# Patient Record
Sex: Male | Born: 1935
Health system: Southern US, Community
[De-identification: ages and names within clinical notes are randomized; demographics above are authoritative.]

## PROBLEM LIST (undated history)

## (undated) DIAGNOSIS — M545 Low back pain, unspecified: Secondary | ICD-10-CM

## (undated) DIAGNOSIS — F32A Depression, unspecified: Secondary | ICD-10-CM

## (undated) DIAGNOSIS — E785 Hyperlipidemia, unspecified: Secondary | ICD-10-CM

## (undated) DIAGNOSIS — I2510 Atherosclerotic heart disease of native coronary artery without angina pectoris: Secondary | ICD-10-CM

## (undated) DIAGNOSIS — R51 Headache: Secondary | ICD-10-CM

## (undated) DIAGNOSIS — I10 Essential (primary) hypertension: Secondary | ICD-10-CM

## (undated) DIAGNOSIS — J189 Pneumonia, unspecified organism: Secondary | ICD-10-CM

## (undated) DIAGNOSIS — I739 Peripheral vascular disease, unspecified: Secondary | ICD-10-CM

## (undated) DIAGNOSIS — Z86010 Personal history of colonic polyps: Secondary | ICD-10-CM

## (undated) DIAGNOSIS — B0229 Other postherpetic nervous system involvement: Secondary | ICD-10-CM

## (undated) DIAGNOSIS — K644 Residual hemorrhoidal skin tags: Secondary | ICD-10-CM

## (undated) DIAGNOSIS — K648 Other hemorrhoids: Secondary | ICD-10-CM

## (undated) DIAGNOSIS — F419 Anxiety disorder, unspecified: Secondary | ICD-10-CM

## (undated) DIAGNOSIS — K219 Gastro-esophageal reflux disease without esophagitis: Secondary | ICD-10-CM

## (undated) DIAGNOSIS — I779 Disorder of arteries and arterioles, unspecified: Secondary | ICD-10-CM

## (undated) DIAGNOSIS — M1990 Unspecified osteoarthritis, unspecified site: Secondary | ICD-10-CM

## (undated) DIAGNOSIS — R7302 Impaired glucose tolerance (oral): Secondary | ICD-10-CM

## (undated) DIAGNOSIS — F329 Major depressive disorder, single episode, unspecified: Secondary | ICD-10-CM

## (undated) DIAGNOSIS — I350 Nonrheumatic aortic (valve) stenosis: Secondary | ICD-10-CM

## (undated) HISTORY — DX: Unspecified osteoarthritis, unspecified site: M19.90

## (undated) HISTORY — DX: Major depressive disorder, single episode, unspecified: F32.9

## (undated) HISTORY — DX: Anxiety disorder, unspecified: F41.9

## (undated) HISTORY — DX: Disorder of arteries and arterioles, unspecified: I77.9

## (undated) HISTORY — DX: Peripheral vascular disease, unspecified: I73.9

## (undated) HISTORY — DX: Impaired glucose tolerance (oral): R73.02

## (undated) HISTORY — PX: CARPAL TUNNEL RELEASE: SHX101

## (undated) HISTORY — DX: Hyperlipidemia, unspecified: E78.5

## (undated) HISTORY — DX: Personal history of colonic polyps: Z86.010

## (undated) HISTORY — PX: ACNE CYST REMOVAL: SUR1112

## (undated) HISTORY — DX: Atherosclerotic heart disease of native coronary artery without angina pectoris: I25.10

## (undated) HISTORY — DX: Pneumonia, unspecified organism: J18.9

## (undated) HISTORY — PX: TONSILLECTOMY: SUR1361

## (undated) HISTORY — DX: Low back pain: M54.5

## (undated) HISTORY — DX: Other postherpetic nervous system involvement: B02.29

## (undated) HISTORY — DX: Low back pain, unspecified: M54.50

## (undated) HISTORY — DX: Depression, unspecified: F32.A

## (undated) HISTORY — DX: Residual hemorrhoidal skin tags: K64.4

## (undated) HISTORY — DX: Other hemorrhoids: K64.8

## (undated) HISTORY — PX: OTHER SURGICAL HISTORY: SHX169

## (undated) HISTORY — DX: Essential (primary) hypertension: I10

## (undated) HISTORY — DX: Nonrheumatic aortic (valve) stenosis: I35.0

## (undated) HISTORY — PX: COLONOSCOPY: SHX174

---

## 2000-04-16 HISTORY — PX: CORONARY ARTERY BYPASS GRAFT: SHX141

## 2000-05-02 ENCOUNTER — Inpatient Hospital Stay (HOSPITAL_COMMUNITY): Admission: EM | Admit: 2000-05-02 | Discharge: 2000-05-11 | Payer: Self-pay | Admitting: Emergency Medicine

## 2000-05-02 ENCOUNTER — Encounter: Payer: Self-pay | Admitting: Internal Medicine

## 2000-05-07 ENCOUNTER — Encounter: Payer: Self-pay | Admitting: Thoracic Surgery (Cardiothoracic Vascular Surgery)

## 2000-05-08 ENCOUNTER — Encounter: Payer: Self-pay | Admitting: Thoracic Surgery (Cardiothoracic Vascular Surgery)

## 2000-05-09 ENCOUNTER — Encounter: Payer: Self-pay | Admitting: Thoracic Surgery (Cardiothoracic Vascular Surgery)

## 2000-05-28 ENCOUNTER — Encounter (HOSPITAL_COMMUNITY): Admission: RE | Admit: 2000-05-28 | Discharge: 2000-07-19 | Payer: Self-pay | Admitting: Cardiology

## 2002-02-07 ENCOUNTER — Emergency Department (HOSPITAL_COMMUNITY): Admission: EM | Admit: 2002-02-07 | Discharge: 2002-02-07 | Payer: Self-pay

## 2002-04-16 DIAGNOSIS — Z860100 Personal history of colon polyps, unspecified: Secondary | ICD-10-CM

## 2002-04-16 HISTORY — DX: Personal history of colonic polyps: Z86.010

## 2002-04-16 HISTORY — DX: Personal history of colon polyps, unspecified: Z86.0100

## 2002-06-04 ENCOUNTER — Encounter: Payer: Self-pay | Admitting: Internal Medicine

## 2005-03-02 ENCOUNTER — Ambulatory Visit: Payer: Self-pay | Admitting: Internal Medicine

## 2005-03-07 ENCOUNTER — Ambulatory Visit: Payer: Self-pay | Admitting: Cardiology

## 2005-03-27 ENCOUNTER — Ambulatory Visit: Payer: Self-pay

## 2005-03-30 ENCOUNTER — Emergency Department (HOSPITAL_COMMUNITY): Admission: EM | Admit: 2005-03-30 | Discharge: 2005-03-30 | Payer: Self-pay | Admitting: Emergency Medicine

## 2005-06-04 ENCOUNTER — Ambulatory Visit: Payer: Self-pay | Admitting: Internal Medicine

## 2005-09-03 ENCOUNTER — Ambulatory Visit: Payer: Self-pay | Admitting: Internal Medicine

## 2005-12-05 ENCOUNTER — Ambulatory Visit: Payer: Self-pay | Admitting: Internal Medicine

## 2005-12-07 ENCOUNTER — Ambulatory Visit: Payer: Self-pay | Admitting: Internal Medicine

## 2005-12-07 ENCOUNTER — Ambulatory Visit (HOSPITAL_COMMUNITY): Admission: RE | Admit: 2005-12-07 | Discharge: 2005-12-07 | Payer: Self-pay | Admitting: Internal Medicine

## 2006-03-04 ENCOUNTER — Ambulatory Visit: Payer: Self-pay | Admitting: Internal Medicine

## 2006-03-11 ENCOUNTER — Ambulatory Visit: Payer: Self-pay | Admitting: Cardiology

## 2006-03-20 ENCOUNTER — Encounter: Payer: Self-pay | Admitting: Cardiology

## 2006-03-20 ENCOUNTER — Ambulatory Visit: Payer: Self-pay

## 2006-06-04 ENCOUNTER — Ambulatory Visit: Payer: Self-pay | Admitting: Internal Medicine

## 2006-08-26 ENCOUNTER — Ambulatory Visit: Payer: Self-pay | Admitting: Internal Medicine

## 2006-08-26 LAB — CONVERTED CEMR LAB
AST: 28 units/L (ref 0–37)
Alkaline Phosphatase: 57 units/L (ref 39–117)
BUN: 20 mg/dL (ref 6–23)
Bilirubin, Direct: 0.2 mg/dL (ref 0.0–0.3)
Calcium: 9.7 mg/dL (ref 8.4–10.5)
Creatinine, Ser: 1.1 mg/dL (ref 0.4–1.5)
Direct LDL: 153.6 mg/dL
GFR calc Af Amer: 85 mL/min
GFR calc non Af Amer: 70 mL/min
Glucose, Bld: 103 mg/dL — ABNORMAL HIGH (ref 70–99)
HDL: 37.3 mg/dL — ABNORMAL LOW (ref >39.0)
Potassium: 4 meq/L (ref 3.5–5.1)
Total Bilirubin: 1.5 mg/dL — ABNORMAL HIGH (ref 0.3–1.2)
VLDL: 31 mg/dL (ref 0–40)

## 2006-09-05 ENCOUNTER — Ambulatory Visit: Payer: Self-pay | Admitting: Internal Medicine

## 2007-01-07 ENCOUNTER — Ambulatory Visit: Payer: Self-pay | Admitting: Internal Medicine

## 2007-02-11 ENCOUNTER — Ambulatory Visit: Payer: Self-pay | Admitting: Internal Medicine

## 2007-02-11 LAB — CONVERTED CEMR LAB
AST: 30 units/L (ref 0–37)
Albumin: 4.4 g/dL (ref 3.5–5.2)
Alkaline Phosphatase: 65 units/L (ref 39–117)
BUN: 18 mg/dL (ref 6–23)
Basophils Absolute: 0 10*3/uL (ref 0.0–0.1)
Chloride: 100 meq/L (ref 96–112)
Eosinophils Absolute: 0.3 10*3/uL (ref 0.0–0.6)
Glucose, Bld: 112 mg/dL — ABNORMAL HIGH (ref 70–99)
HCT: 47.8 % (ref 39.0–52.0)
Hemoglobin: 16.2 g/dL (ref 13.0–17.0)
RDW: 13.4 % (ref 11.5–14.6)
Sodium: 135 meq/L (ref 135–145)
TSH: 1.76 microintl units/mL (ref 0.35–5.50)
Total Protein: 7.8 g/dL (ref 6.0–8.3)
WBC: 7.2 10*3/uL (ref 4.5–10.5)

## 2007-02-19 ENCOUNTER — Ambulatory Visit: Payer: Self-pay | Admitting: Cardiology

## 2007-03-05 ENCOUNTER — Ambulatory Visit: Payer: Self-pay

## 2007-03-05 ENCOUNTER — Encounter: Payer: Self-pay | Admitting: Internal Medicine

## 2007-04-17 DIAGNOSIS — B0229 Other postherpetic nervous system involvement: Secondary | ICD-10-CM

## 2007-04-17 DIAGNOSIS — J189 Pneumonia, unspecified organism: Secondary | ICD-10-CM

## 2007-04-17 HISTORY — DX: Other postherpetic nervous system involvement: B02.29

## 2007-04-17 HISTORY — DX: Pneumonia, unspecified organism: J18.9

## 2007-05-01 ENCOUNTER — Ambulatory Visit: Payer: Self-pay | Admitting: Internal Medicine

## 2007-05-01 DIAGNOSIS — IMO0001 Reserved for inherently not codable concepts without codable children: Secondary | ICD-10-CM

## 2007-05-01 DIAGNOSIS — E785 Hyperlipidemia, unspecified: Secondary | ICD-10-CM

## 2007-05-01 DIAGNOSIS — I1310 Hypertensive heart and chronic kidney disease without heart failure, with stage 1 through stage 4 chronic kidney disease, or unspecified chronic kidney disease: Secondary | ICD-10-CM

## 2007-05-01 DIAGNOSIS — R7309 Other abnormal glucose: Secondary | ICD-10-CM

## 2007-05-27 ENCOUNTER — Telehealth: Payer: Self-pay | Admitting: Internal Medicine

## 2007-07-03 ENCOUNTER — Ambulatory Visit: Payer: Self-pay | Admitting: Endocrinology

## 2007-07-03 DIAGNOSIS — B029 Zoster without complications: Secondary | ICD-10-CM | POA: Insufficient documentation

## 2007-07-14 ENCOUNTER — Telehealth: Payer: Self-pay | Admitting: Internal Medicine

## 2007-07-17 ENCOUNTER — Ambulatory Visit: Payer: Self-pay | Admitting: Internal Medicine

## 2007-07-17 DIAGNOSIS — Z860100 Personal history of colon polyps, unspecified: Secondary | ICD-10-CM | POA: Insufficient documentation

## 2007-07-17 DIAGNOSIS — M545 Low back pain: Secondary | ICD-10-CM

## 2007-07-17 DIAGNOSIS — B0229 Other postherpetic nervous system involvement: Secondary | ICD-10-CM

## 2007-07-30 ENCOUNTER — Ambulatory Visit: Payer: Self-pay | Admitting: Internal Medicine

## 2007-09-17 ENCOUNTER — Ambulatory Visit: Payer: Self-pay | Admitting: Internal Medicine

## 2007-09-18 LAB — CONVERTED CEMR LAB
Bilirubin, Direct: 0.2 mg/dL (ref 0.0–0.3)
Chloride: 106 meq/L (ref 96–112)
Cholesterol: 193 mg/dL (ref 0–200)
GFR calc Af Amer: 77 mL/min
HDL: 41.5 mg/dL (ref >39.0)
LDL Cholesterol: 134 mg/dL — ABNORMAL HIGH (ref 0–99)
Potassium: 4.1 meq/L (ref 3.5–5.1)
Sodium: 141 meq/L (ref 135–145)
TSH: 1.32 microintl units/mL (ref 0.35–5.50)
Total Bilirubin: 1.9 mg/dL — ABNORMAL HIGH (ref 0.3–1.2)
Total CHOL/HDL Ratio: 4.7
Total CK: 92 units/L (ref 7–195)
VLDL: 18 mg/dL (ref 0–40)

## 2007-09-22 ENCOUNTER — Ambulatory Visit: Payer: Self-pay | Admitting: Internal Medicine

## 2007-09-22 DIAGNOSIS — R945 Abnormal results of liver function studies: Secondary | ICD-10-CM

## 2007-09-22 DIAGNOSIS — M79609 Pain in unspecified limb: Secondary | ICD-10-CM | POA: Insufficient documentation

## 2007-10-07 ENCOUNTER — Encounter: Payer: Self-pay | Admitting: Internal Medicine

## 2008-01-12 ENCOUNTER — Ambulatory Visit: Payer: Self-pay | Admitting: Internal Medicine

## 2008-01-15 LAB — CONVERTED CEMR LAB
ALT: 26 units/L (ref 0–53)
BUN: 16 mg/dL (ref 6–23)
CO2: 25 meq/L (ref 19–32)
Cholesterol: 235 mg/dL (ref 0–200)
Creatinine, Ser: 1.2 mg/dL (ref 0.4–1.5)
GFR calc non Af Amer: 63 mL/min
Glucose, Bld: 101 mg/dL — ABNORMAL HIGH (ref 70–99)
Sodium: 139 meq/L (ref 135–145)
Total Bilirubin: 1.6 mg/dL — ABNORMAL HIGH (ref 0.3–1.2)
Total CHOL/HDL Ratio: 5.1

## 2008-01-19 ENCOUNTER — Ambulatory Visit: Payer: Self-pay | Admitting: Internal Medicine

## 2008-01-26 ENCOUNTER — Inpatient Hospital Stay (HOSPITAL_COMMUNITY): Admission: EM | Admit: 2008-01-26 | Discharge: 2008-01-31 | Payer: Self-pay | Admitting: Emergency Medicine

## 2008-01-26 ENCOUNTER — Ambulatory Visit: Payer: Self-pay | Admitting: Internal Medicine

## 2008-02-03 ENCOUNTER — Ambulatory Visit: Payer: Self-pay | Admitting: Internal Medicine

## 2008-02-03 DIAGNOSIS — J189 Pneumonia, unspecified organism: Secondary | ICD-10-CM | POA: Insufficient documentation

## 2008-02-03 DIAGNOSIS — J90 Pleural effusion, not elsewhere classified: Secondary | ICD-10-CM | POA: Insufficient documentation

## 2008-02-03 DIAGNOSIS — R0602 Shortness of breath: Secondary | ICD-10-CM

## 2008-02-19 ENCOUNTER — Ambulatory Visit: Payer: Self-pay | Admitting: Cardiology

## 2008-03-04 ENCOUNTER — Ambulatory Visit: Payer: Self-pay | Admitting: Internal Medicine

## 2008-03-04 LAB — CONVERTED CEMR LAB
BUN: 24 mg/dL — ABNORMAL HIGH (ref 6–23)
Calcium: 9.8 mg/dL (ref 8.4–10.5)
Creatinine, Ser: 1.5 mg/dL (ref 0.4–1.5)
GFR calc Af Amer: 59 mL/min
Potassium: 3.9 meq/L (ref 3.5–5.1)
TSH: 0.68 microintl units/mL (ref 0.35–5.50)

## 2008-03-08 ENCOUNTER — Ambulatory Visit: Payer: Self-pay | Admitting: Internal Medicine

## 2008-03-08 DIAGNOSIS — R634 Abnormal weight loss: Secondary | ICD-10-CM

## 2008-03-10 ENCOUNTER — Ambulatory Visit: Payer: Self-pay

## 2008-06-04 ENCOUNTER — Ambulatory Visit: Payer: Self-pay | Admitting: Internal Medicine

## 2008-06-04 LAB — CONVERTED CEMR LAB
ALT: 34 units/L (ref 0–53)
AST: 35 units/L (ref 0–37)
Bilirubin, Direct: 0.2 mg/dL (ref 0.0–0.3)
Calcium: 9.6 mg/dL (ref 8.4–10.5)
GFR calc Af Amer: 76 mL/min
GFR calc non Af Amer: 63 mL/min
Sodium: 141 meq/L (ref 135–145)
Triglycerides: 69 mg/dL (ref 0–149)
VLDL: 14 mg/dL (ref 0–40)

## 2008-06-08 ENCOUNTER — Ambulatory Visit: Payer: Self-pay | Admitting: Internal Medicine

## 2008-09-02 ENCOUNTER — Ambulatory Visit: Payer: Self-pay | Admitting: Internal Medicine

## 2008-09-02 LAB — CONVERTED CEMR LAB
ALT: 22 units/L (ref 0–53)
AST: 28 units/L (ref 0–37)
Albumin: 3.9 g/dL (ref 3.5–5.2)
CO2: 26 meq/L (ref 19–32)
Calcium: 9.4 mg/dL (ref 8.4–10.5)
Chloride: 107 meq/L (ref 96–112)
Cholesterol: 217 mg/dL — ABNORMAL HIGH (ref 0–200)
Direct LDL: 168.5 mg/dL
GFR calc non Af Amer: 69.45 mL/min (ref >60)
HDL: 45.9 mg/dL (ref >39.00)
Hgb A1c MFr Bld: 5.8 % (ref 4.6–6.5)
Potassium: 3.7 meq/L (ref 3.5–5.1)
Sodium: 142 meq/L (ref 135–145)
Total Protein: 7 g/dL (ref 6.0–8.3)
Triglycerides: 77 mg/dL (ref 0.0–149.0)
VLDL: 15.4 mg/dL (ref 0.0–40.0)

## 2008-09-06 ENCOUNTER — Ambulatory Visit: Payer: Self-pay | Admitting: Internal Medicine

## 2008-11-17 ENCOUNTER — Ambulatory Visit: Payer: Self-pay | Admitting: Internal Medicine

## 2008-11-17 ENCOUNTER — Ambulatory Visit: Payer: Self-pay | Admitting: Endocrinology

## 2008-11-17 ENCOUNTER — Inpatient Hospital Stay (HOSPITAL_COMMUNITY): Admission: AD | Admit: 2008-11-17 | Discharge: 2008-11-19 | Payer: Self-pay | Admitting: Internal Medicine

## 2008-11-17 DIAGNOSIS — I200 Unstable angina: Secondary | ICD-10-CM

## 2008-11-17 DIAGNOSIS — R1013 Epigastric pain: Secondary | ICD-10-CM

## 2008-11-17 DIAGNOSIS — R079 Chest pain, unspecified: Secondary | ICD-10-CM

## 2008-11-19 ENCOUNTER — Encounter: Payer: Self-pay | Admitting: Endocrinology

## 2008-12-06 ENCOUNTER — Encounter: Payer: Self-pay | Admitting: Cardiovascular Disease

## 2008-12-06 ENCOUNTER — Ambulatory Visit: Payer: Self-pay | Admitting: Internal Medicine

## 2008-12-31 ENCOUNTER — Ambulatory Visit: Payer: Self-pay | Admitting: Internal Medicine

## 2008-12-31 LAB — CONVERTED CEMR LAB
AST: 28 units/L (ref 0–37)
CO2: 26 meq/L (ref 19–32)
Chloride: 110 meq/L (ref 96–112)
GFR calc non Af Amer: 84.13 mL/min (ref >60)
Glucose, Bld: 96 mg/dL (ref 70–99)
Hgb A1c MFr Bld: 5.6 % (ref 4.6–6.5)
Potassium: 4 meq/L (ref 3.5–5.1)
TSH: 0.56 microintl units/mL (ref 0.35–5.50)
Total CHOL/HDL Ratio: 5
Total Protein: 7.4 g/dL (ref 6.0–8.3)
Triglycerides: 99 mg/dL (ref 0.0–149.0)
VLDL: 19.8 mg/dL (ref 0.0–40.0)

## 2009-01-04 ENCOUNTER — Ambulatory Visit: Payer: Self-pay | Admitting: Internal Medicine

## 2009-02-11 ENCOUNTER — Ambulatory Visit: Payer: Self-pay | Admitting: Cardiology

## 2009-02-11 DIAGNOSIS — I6529 Occlusion and stenosis of unspecified carotid artery: Secondary | ICD-10-CM

## 2009-03-21 ENCOUNTER — Ambulatory Visit: Payer: Self-pay

## 2009-03-21 ENCOUNTER — Encounter: Payer: Self-pay | Admitting: Cardiology

## 2009-03-25 ENCOUNTER — Ambulatory Visit: Payer: Self-pay | Admitting: Cardiology

## 2009-03-25 ENCOUNTER — Telehealth: Payer: Self-pay | Admitting: Cardiology

## 2009-03-25 DIAGNOSIS — I2510 Atherosclerotic heart disease of native coronary artery without angina pectoris: Secondary | ICD-10-CM

## 2009-03-25 LAB — CONVERTED CEMR LAB
ALT: 32 units/L (ref 0–53)
CO2: 27 meq/L (ref 19–32)
Calcium: 9.4 mg/dL (ref 8.4–10.5)
Cholesterol: 194 mg/dL (ref 0–200)
Glucose, Bld: 108 mg/dL — ABNORMAL HIGH (ref 70–99)
HDL: 42.8 mg/dL (ref >39.00)
LDL Cholesterol: 131 mg/dL — ABNORMAL HIGH (ref 0–99)
Total Bilirubin: 1.6 mg/dL — ABNORMAL HIGH (ref 0.3–1.2)
Total CHOL/HDL Ratio: 5
VLDL: 20.4 mg/dL (ref 0.0–40.0)

## 2009-03-30 ENCOUNTER — Ambulatory Visit: Payer: Self-pay | Admitting: Cardiology

## 2009-04-01 ENCOUNTER — Ambulatory Visit: Payer: Self-pay | Admitting: Internal Medicine

## 2009-04-01 DIAGNOSIS — Z87891 Personal history of nicotine dependence: Secondary | ICD-10-CM | POA: Insufficient documentation

## 2009-05-10 ENCOUNTER — Encounter: Payer: Self-pay | Admitting: Internal Medicine

## 2009-06-29 ENCOUNTER — Ambulatory Visit: Payer: Self-pay | Admitting: Internal Medicine

## 2009-06-29 LAB — CONVERTED CEMR LAB
Calcium: 9.2 mg/dL (ref 8.4–10.5)
Chloride: 107 meq/L (ref 96–112)
GFR calc non Af Amer: 75.99 mL/min (ref >60)
Glucose, Bld: 111 mg/dL — ABNORMAL HIGH (ref 70–99)
Potassium: 3.8 meq/L (ref 3.5–5.1)
Sodium: 141 meq/L (ref 135–145)

## 2009-07-01 ENCOUNTER — Ambulatory Visit: Payer: Self-pay | Admitting: Internal Medicine

## 2009-07-01 DIAGNOSIS — R635 Abnormal weight gain: Secondary | ICD-10-CM | POA: Insufficient documentation

## 2009-09-15 ENCOUNTER — Ambulatory Visit: Payer: Self-pay | Admitting: Cardiology

## 2009-09-19 IMAGING — CT CT ANGIO CHEST
2 of 5 series · 19 of 36 positions shown · IV contrast (100 ML OMNI 300)
Comparison: None

CLINICAL DATA: Cough, shortness of breath, chest pain and fever.

CT ANGIOGRAPHY CHEST
TECHNIQUE: Multidetector CT imaging of the chest using the
standard protocol during bolus administration of intravenous
contrast. Multiplanar reconstructed images including MIPs were
obtained and reviewed to evaluate the vascular anatomy.
Contrast: 100 ml intravenous [id number]

[Series 3: pe · axial · 0.76mm/px · z∈[-261,-40]mm · 16 of 201 slices shown]
[im 12/201  lung]
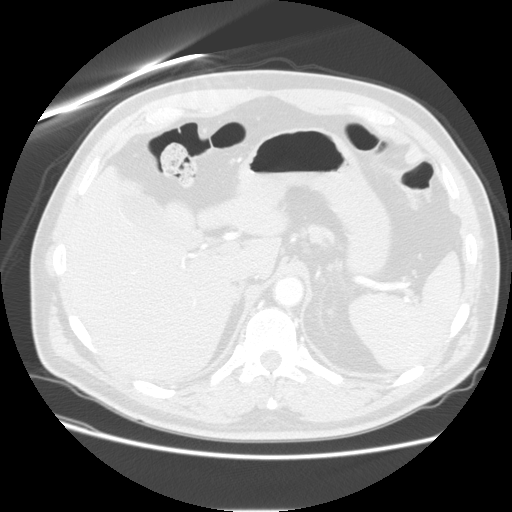
[im 24/201  mediastinal]
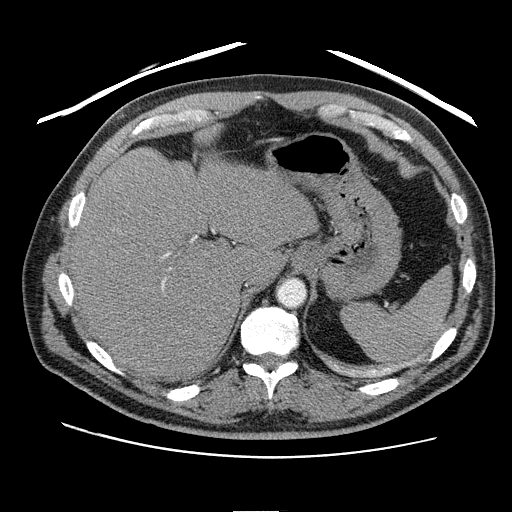
[im 36/201  lung]
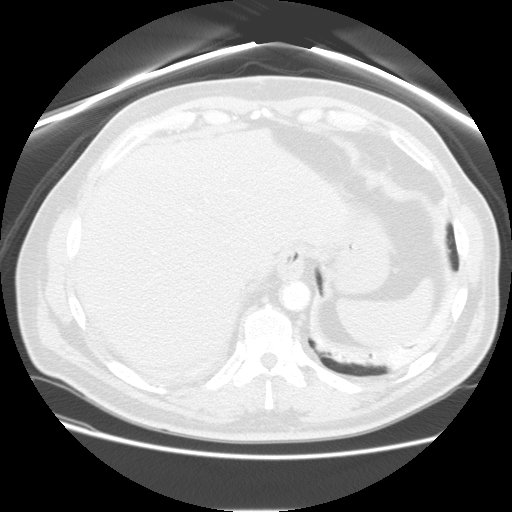
[im 48/201  mediastinal]
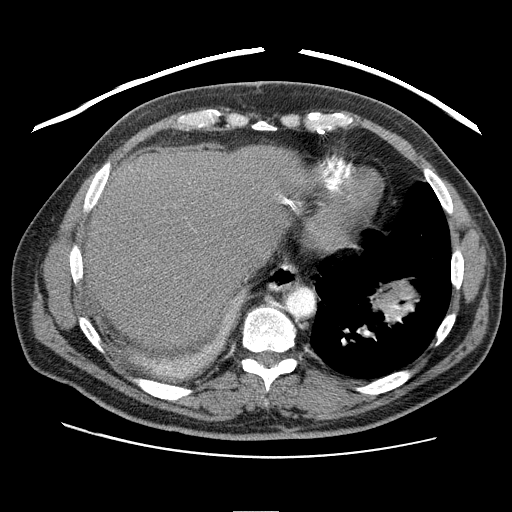
[im 59/201  lung]
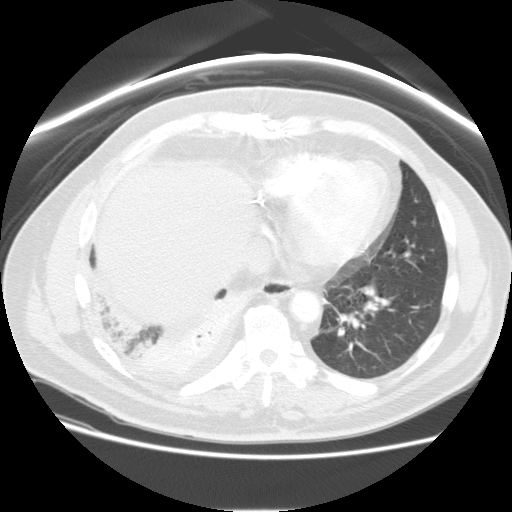
[im 71/201  mediastinal]
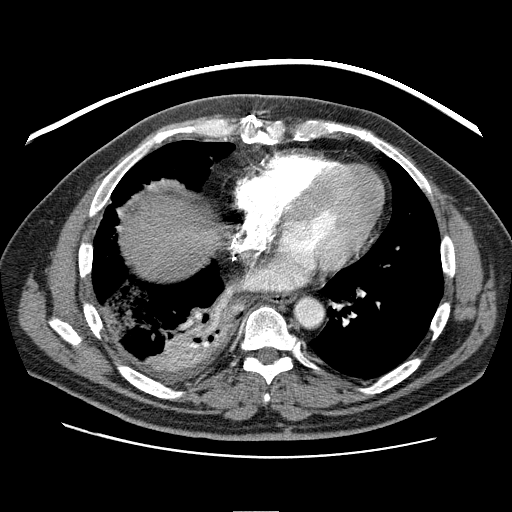
[im 83/201  lung]
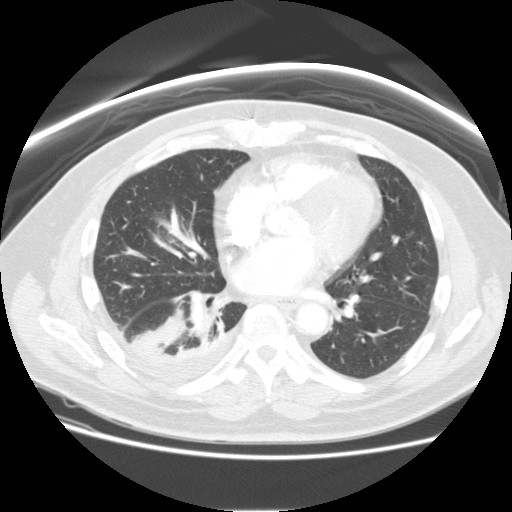
[im 95/201  mediastinal]
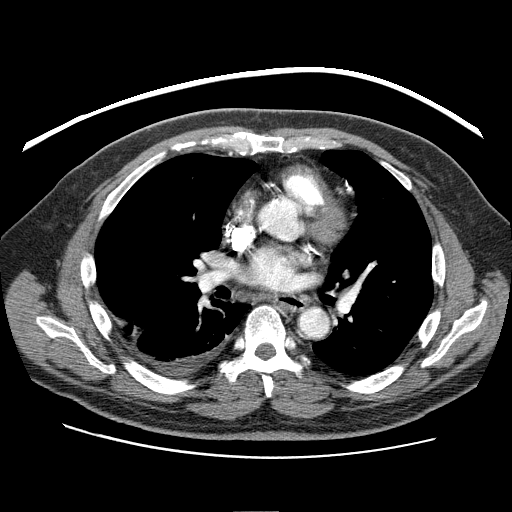
[im 106/201  lung]
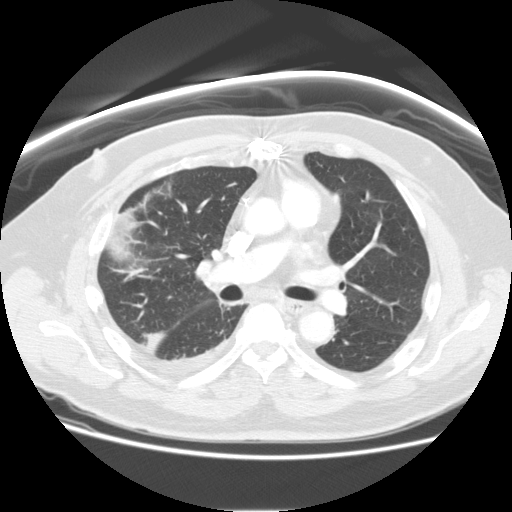
[im 118/201  mediastinal]
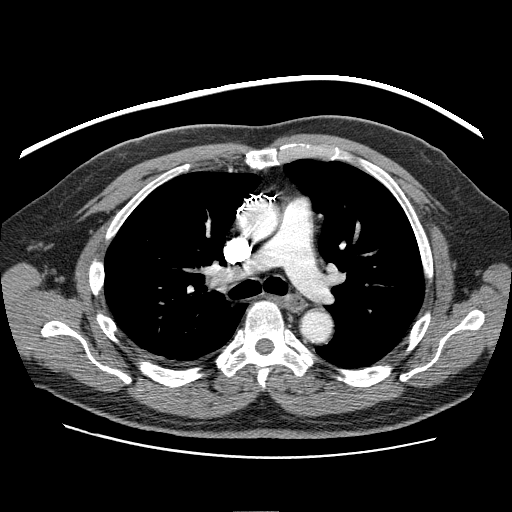
[im 130/201  lung]
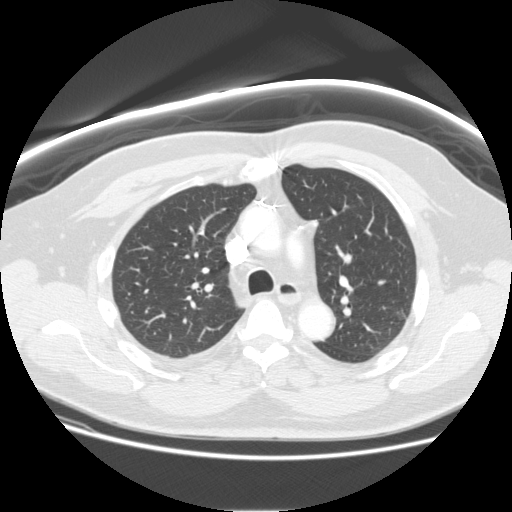
[im 142/201  mediastinal]
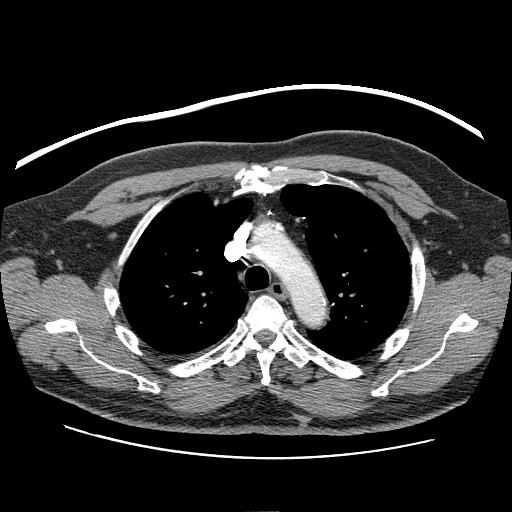
[im 153/201  lung]
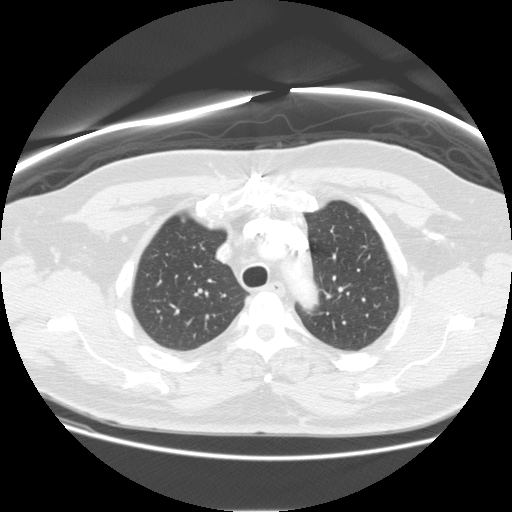
[im 165/201  mediastinal]
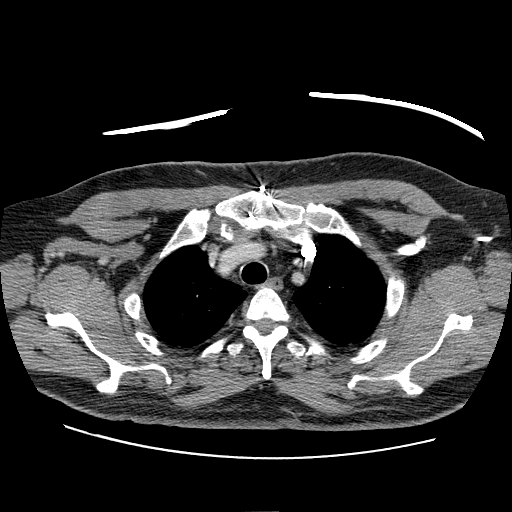
[im 177/201  lung]
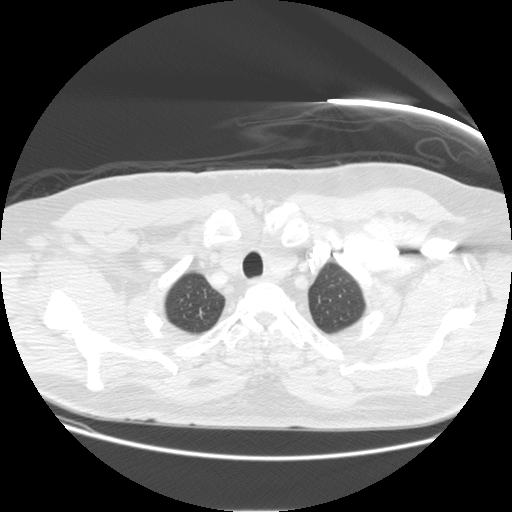
[im 189/201  mediastinal]
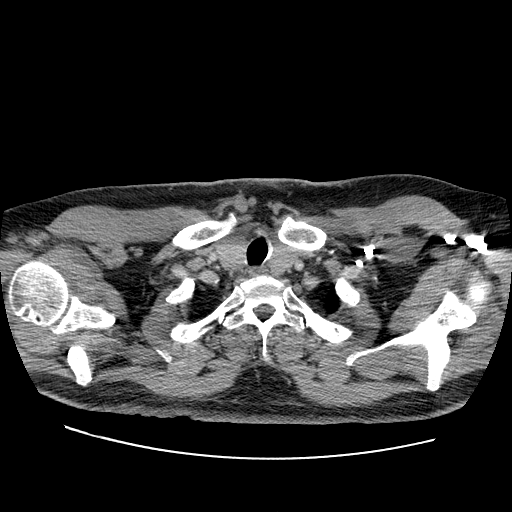

[Series 301: cor · coronal · 0.76mm/px · 3 of 114 slices shown]
[im 23/114  mediastinal]
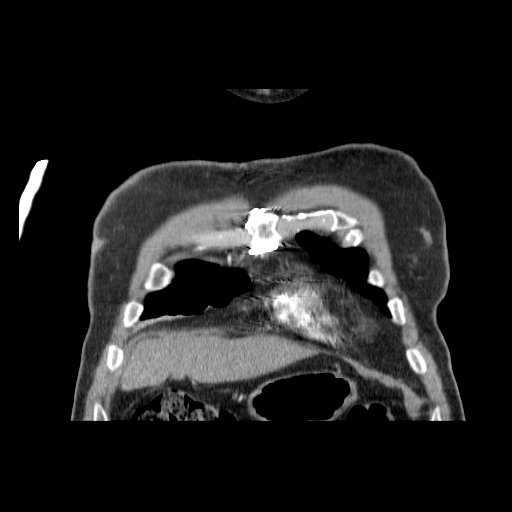
[im 46/114  mediastinal]
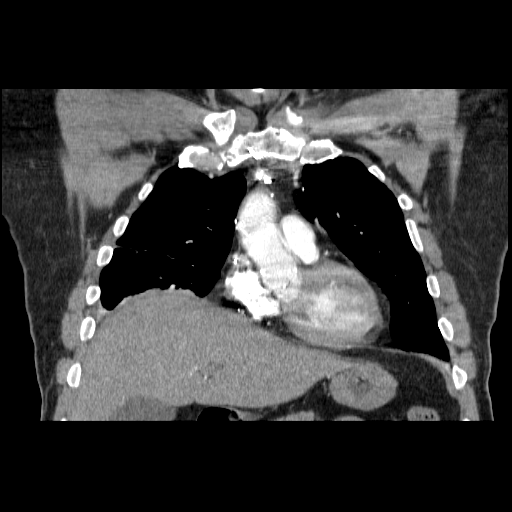
[im 68/114  mediastinal]
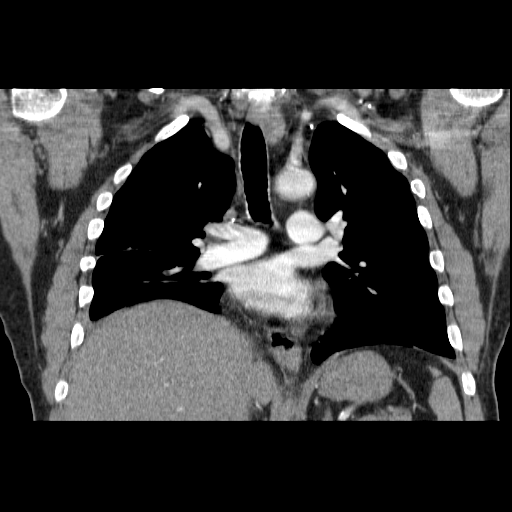

[19 of 36 positions shown; findings below may reference images not displayed]

FINDINGS: This is a technically adequate study.

There are no definite filling defects identified within the
pulmonary arterial system to suggest pulmonary emboli.
There is no evidence of thoracic aortic dissection or aneurysm.
Evidence of prior cardiac surgery noted.

Airspace disease and atelectasis within the right lower lobe noted
with a small right pleural effusion.
Mild left basilar atelectasis is noted.
There is no evidence of pericardial effusion or enlarged lymph
nodes.

No acute or suspicious bony abnormalities are identified.
IMPRESSION: Right lower lobe pneumonia with associated atelectasis and small
right pleural effusion.

No evidence of pulmonary emboli or thoracic aortic
aneurysm/dissection.

Mild left basilar atelectasis.

## 2009-09-27 ENCOUNTER — Telehealth (INDEPENDENT_AMBULATORY_CARE_PROVIDER_SITE_OTHER): Payer: Self-pay | Admitting: *Deleted

## 2009-09-28 ENCOUNTER — Ambulatory Visit: Payer: Self-pay | Admitting: Cardiology

## 2009-09-28 ENCOUNTER — Encounter: Payer: Self-pay | Admitting: Cardiology

## 2009-09-28 ENCOUNTER — Ambulatory Visit: Payer: Self-pay | Admitting: Internal Medicine

## 2009-09-28 ENCOUNTER — Encounter (HOSPITAL_COMMUNITY): Admission: RE | Admit: 2009-09-28 | Discharge: 2009-11-15 | Payer: Self-pay | Admitting: Cardiology

## 2009-09-28 ENCOUNTER — Ambulatory Visit: Payer: Self-pay

## 2009-09-28 LAB — CONVERTED CEMR LAB
ALT: 34 units/L (ref 0–53)
AST: 34 units/L (ref 0–37)
Albumin: 4.2 g/dL (ref 3.5–5.2)
Alkaline Phosphatase: 56 units/L (ref 39–117)
Bilirubin Urine: NEGATIVE
Bilirubin, Direct: 0.2 mg/dL (ref 0.0–0.3)
CO2: 23 meq/L (ref 19–32)
Chloride: 107 meq/L (ref 96–112)
Cholesterol: 204 mg/dL — ABNORMAL HIGH (ref 0–200)
Eosinophils Relative: 3.7 % (ref 0.0–5.0)
GFR calc non Af Amer: 87.63 mL/min (ref >60)
Hemoglobin, Urine: NEGATIVE
Ketones, ur: NEGATIVE mg/dL
Lymphocytes Relative: 30.1 % (ref 12.0–46.0)
Lymphs Abs: 2 10*3/uL (ref 0.7–4.0)
MCV: 88.2 fL (ref 78.0–100.0)
Neutrophils Relative %: 58.8 % (ref 43.0–77.0)
Nitrite: NEGATIVE
PSA: 0.36 ng/mL (ref 0.10–4.00)
Potassium: 3.9 meq/L (ref 3.5–5.1)
RBC: 4.94 M/uL (ref 4.22–5.81)
TSH: 1.47 microintl units/mL (ref 0.35–5.50)
Total Bilirubin: 1.3 mg/dL — ABNORMAL HIGH (ref 0.3–1.2)
Total Protein, Urine: NEGATIVE mg/dL
Urine Glucose: NEGATIVE mg/dL
VLDL: 21.2 mg/dL (ref 0.0–40.0)

## 2009-10-03 ENCOUNTER — Ambulatory Visit: Payer: Self-pay | Admitting: Internal Medicine

## 2009-10-05 ENCOUNTER — Telehealth: Payer: Self-pay | Admitting: Cardiology

## 2009-10-05 ENCOUNTER — Encounter: Payer: Self-pay | Admitting: Cardiology

## 2009-10-06 ENCOUNTER — Ambulatory Visit: Payer: Self-pay | Admitting: Cardiology

## 2009-10-07 ENCOUNTER — Telehealth: Payer: Self-pay | Admitting: Cardiology

## 2009-10-10 ENCOUNTER — Telehealth: Payer: Self-pay | Admitting: Cardiology

## 2009-10-10 LAB — CONVERTED CEMR LAB
Eosinophils Absolute: 0.3 10*3/uL (ref 0.0–0.7)
Eosinophils Relative: 4.1 % (ref 0.0–5.0)
GFR calc non Af Amer: 90.58 mL/min (ref >60)
Glucose, Bld: 103 mg/dL — ABNORMAL HIGH (ref 70–99)
HCT: 43.1 % (ref 39.0–52.0)
INR: 1.1 — ABNORMAL HIGH (ref 0.8–1.0)
Lymphocytes Relative: 34.1 % (ref 12.0–46.0)
Lymphs Abs: 2.3 10*3/uL (ref 0.7–4.0)
MCHC: 34.2 g/dL (ref 30.0–36.0)
Neutro Abs: 3.7 10*3/uL (ref 1.4–7.7)
Neutrophils Relative %: 55 % (ref 43.0–77.0)
RBC: 4.89 M/uL (ref 4.22–5.81)
RDW: 14.2 % (ref 11.5–14.6)
WBC: 6.7 10*3/uL (ref 4.5–10.5)
aPTT: 29.2 s — ABNORMAL HIGH (ref 21.7–28.8)

## 2009-10-11 ENCOUNTER — Ambulatory Visit: Payer: Self-pay | Admitting: Cardiology

## 2009-10-11 ENCOUNTER — Inpatient Hospital Stay (HOSPITAL_BASED_OUTPATIENT_CLINIC_OR_DEPARTMENT_OTHER): Admission: RE | Admit: 2009-10-11 | Discharge: 2009-10-11 | Payer: Self-pay | Admitting: Cardiology

## 2009-10-13 ENCOUNTER — Telehealth: Payer: Self-pay | Admitting: Cardiology

## 2009-10-28 ENCOUNTER — Encounter (INDEPENDENT_AMBULATORY_CARE_PROVIDER_SITE_OTHER): Payer: Self-pay | Admitting: *Deleted

## 2009-11-10 ENCOUNTER — Ambulatory Visit: Payer: Self-pay | Admitting: Cardiology

## 2009-12-14 ENCOUNTER — Telehealth: Payer: Self-pay | Admitting: Internal Medicine

## 2009-12-23 ENCOUNTER — Ambulatory Visit: Payer: Self-pay | Admitting: Internal Medicine

## 2009-12-23 LAB — CONVERTED CEMR LAB
Albumin: 4.2 g/dL (ref 3.5–5.2)
Bilirubin, Direct: 0.3 mg/dL (ref 0.0–0.3)
CO2: 27 meq/L (ref 19–32)
Chloride: 105 meq/L (ref 96–112)
Creatinine, Ser: 1.1 mg/dL (ref 0.4–1.5)
Glucose, Bld: 89 mg/dL (ref 70–99)
HDL: 41.9 mg/dL (ref >39.00)
LDL Cholesterol: 127 mg/dL — ABNORMAL HIGH (ref 0–99)
Sodium: 141 meq/L (ref 135–145)
Total Bilirubin: 1.9 mg/dL — ABNORMAL HIGH (ref 0.3–1.2)

## 2009-12-27 ENCOUNTER — Ambulatory Visit: Payer: Self-pay | Admitting: Internal Medicine

## 2010-01-05 ENCOUNTER — Telehealth (INDEPENDENT_AMBULATORY_CARE_PROVIDER_SITE_OTHER): Payer: Self-pay | Admitting: *Deleted

## 2010-01-24 ENCOUNTER — Ambulatory Visit: Payer: Self-pay | Admitting: Internal Medicine

## 2010-03-06 ENCOUNTER — Ambulatory Visit: Payer: Self-pay | Admitting: Cardiology

## 2010-03-24 ENCOUNTER — Ambulatory Visit: Payer: Self-pay | Admitting: Internal Medicine

## 2010-03-29 LAB — CONVERTED CEMR LAB
BUN: 16 mg/dL (ref 6–23)
CO2: 26 meq/L (ref 19–32)
Calcium: 9.5 mg/dL (ref 8.4–10.5)
GFR calc non Af Amer: 81.29 mL/min (ref >60.00)
Potassium: 4 meq/L (ref 3.5–5.1)

## 2010-03-31 ENCOUNTER — Ambulatory Visit: Payer: Self-pay | Admitting: Internal Medicine

## 2010-03-31 DIAGNOSIS — R05 Cough: Secondary | ICD-10-CM

## 2010-04-24 ENCOUNTER — Encounter: Payer: Self-pay | Admitting: Internal Medicine

## 2010-05-18 NOTE — Procedures (Signed)
Summary: Colonoscopy: Hyperplastic Polyp   Colonoscopy  Procedure date:  06/04/2002  Findings:      Results: Hemorrhoids. Pathology:  Hyperplastic polyp.  Location:  [facility].    Comments:      Repeat colonoscopy in 10 years.    Procedures Next Due Date:    Colonoscopy: 06/2012  Patient Name: [name] [name], [name] [name] MRN:  Procedure Procedures: Colonoscopy CPT: 11914.    with polypectomy. CPT: A3573898.  Personnel: Endoscopist: Iva Boop, MD, [facility].  Referred By: Linda Hedges Plotnikov, MD.  Exam Location: Exam performed in Outpatient Clinic. Outpatient  Patient Consent: Procedure, Alternatives, Risks and Benefits discussed, consent obtained, from patient. Consent was obtained by the RN.  Indications Symptoms: Hematochezia.  History  Pre-Exam Physical: Performed Jun 04, 2002. Cardio-pulmonary exam WNL. Rectal exam abnormal. HEENT exam , Abdominal exam, Mental status exam WNL. Abnormal PE findings include: external hemorrhoids.  Exam Exam: Extent of exam reached: Cecum, extent intended: Cecum.  The cecum was identified by appendiceal orifice and IC valve. Patient position: on left side. Colon retroflexion performed. Images taken. ASA Classification: II. Tolerance: excellent.  Monitoring: Pulse and BP monitoring, Oximetry used. Supplemental O2 given.  Colon Prep Used Golytely for colon prep. Prep results: excellent.  Sedation Meds: Patient assessed and found to be appropriate for moderate (conscious) sedation. Fentanyl 75 mcg. given IV. Versed 8 mg. given IV.  Findings POLYP: Descending Colon, Maximum size: 6 mm. sessile polyp. Distance from Anus 50 cm. Procedure:  snare without cautery, removed, retrieved, Polyp sent to pathology. ICD9: Neoplasia, Benign, Large Bowel: 211.3.  - NORMAL EXAM: Cecum to Splenic Flexure.  NORMAL EXAM: Sigmoid Colon.  MULTIPLE POLYPS: Rectum. Procedure:  biopsy without cautery, removed, retrieved, 3 polyps Polyps sent to  pathology. ICD9: Neoplasia, Benign, Rectum: 211.4. Comments:  3 3-4 mm flat white polyps removed.  HEMORRHOIDS: Internal and External. Size: Grade II. Not bleeding. Not thrombosed. ICD9: Hemorrhoids, Internal and  External: 455.6.   Assessment Abnormal examination, see findings above.  Diagnoses: 211.3: Neoplasia, Benign, Large Bowel.  211.4: Neoplasia, Benign, Rectum.  455.6: Hemorrhoids, Internal and  External.   Events  Unplanned Interventions: No intervention was required.  Plans Medication Plan: Await pathology.  Patient Education: Patient given standard instructions for: Polyps. Hemorrhoids.  Disposition: After procedure patient sent to recovery. After recovery patient sent home.  Scheduling/Referral: Await pathology to schedule patient.  Comments: I will review path and determine follow-up plans.  CC:   Sonda Primes, MD  This report was created from the original endoscopy report, which was reviewed and signed by the above listed endoscopist.

## 2010-05-18 NOTE — Letter (Signed)
Summary: New Patient letter  [facility]  [address] [place], [place] [address]   Phone: [phone]  Fax: [phone]       10/28/2009 MRN: [id number]  [name] [name] [address] [place], [place]  [address]  Dear Mr. ESKENAZI,  Welcome to the Gastroenterology Division at [facility].    You are scheduled to see Dr.  Juanda Chance on 12/23/2009 at 10:30AM on the 3rd floor at [facility], [address]  We ask that you try to arrive at our office 15 minutes prior to your appointment time to allow for check-in.  We would like you to complete the enclosed self-administered evaluation form prior to your visit and bring it with you on the day of your appointment.  We will review it with you.  Also, please bring a complete list of all your medications or, if you prefer, bring the medication bottles and we will list them.  Please bring your insurance card so that we may make a copy of it.  If your insurance requires a referral to see a specialist, please bring your referral form from your primary care physician.  Co-payments are due at the time of your visit and may be paid by cash, check or credit card.     Your office visit will consist of a consult with your physician (includes a physical exam), any laboratory testing he/she may order, scheduling of any necessary diagnostic testing (e.g. x-ray, ultrasound, CT-scan), and scheduling of a procedure (e.g. Endoscopy, Colonoscopy) if required.  Please allow enough time on your schedule to allow for any/all of these possibilities.    If you cannot keep your appointment, please call [phone] to cancel or reschedule prior to your appointment date.  This allows [place] the opportunity to schedule an appointment for another patient in need of care.  If you do not cancel or reschedule by 5 p.m. the business day prior to your appointment date, you will be charged a $50.00 late cancellation/no-show fee.    Thank you for choosing  [facility] for your medical needs.  We appreciate the opportunity to care for you.  Please visit [place] at our website  to learn more about our practice.                     Sincerely,                                                             The Gastroenterology Division

## 2010-05-18 NOTE — Assessment & Plan Note (Signed)
Summary: F/U /CY  Medications Added LOTREL 5-20 MG CAPS (AMLODIPINE BESY-BENAZEPRIL HCL) 1 cap once daily        Visit Type:  rov Primary Provider:  Tresa Garter MD  CC:  chest tightness sometimes .Marland Kitchen..denies any edema or sob...Marland Kitchenpt has lost 7 lb's since 06/2009.  History of Present Illness: Mr. Liskey comes in today for evaluation and management his coronary disease.  He did have some atypical chest pain on the left side of his chest. It does not radiate. It doesn't appear to be associated with exertion. There is no other concomitant symptoms.  He has lost the weight he gained during the winter. Blood pressure is much better now.  He denies orthopnea, PND or edema. He's had no palpitations or syncope.  His last stress test was in November 2008.  Clinical Reports Reviewed:  Nuclear Study:  03/05/2007:    Impression:  EF: 64%  Exercise Capacity: Adenosine study with no exercise. Blood Pressure Response: Clinical Symptoms: ECG Impression:Baseline: NSR with RBBB and diffuse ST-T wave abnormalities. No significant ST segment change with adenosine.  Overall Impression: Normal stress nuclear study.  Arvilla Meres, MD   Current Medications (verified): 1)  Crestor 40 Mg Tabs (Rosuvastatin Calcium) .Marland Kitchen.. 1 By Mouth Qd 2)  Vitamin D3 1000 Unit  Tabs (Cholecalciferol) .Marland Kitchen.. 1 Qd 3)  Aspirin 325 Mg Tabs (Aspirin) .... Take 1 Tab By Mouth Every Day 4)  Folic Acid 400 Mcg  Tabs (Folic Acid) .Marland Kitchen.. 1 By Mouth Qd 5)  Travatan 0.004 %  Soln (Travoprost) .... Xto Eye Q Hs 6)  Multivitamins   Tabs (Multiple Vitamin) .Marland Kitchen.. 1 Tab Once Daily 7)  Vitamin B-12 500 Mcg Tabs (Cyanocobalamin) .... Once Daily 8)  Plavix 75 Mg Tabs (Clopidogrel Bisulfate) .Marland Kitchen.. 1 Qd 9)  Nitrostat 0.4 Mg Subl (Nitroglycerin) .Marland Kitchen.. 1 Q5 Min As Needed Chest Pain 10)  Lopressor Hct 100-25 Mg Tabs (Metoprolol-Hydrochlorothiazide) .Marland Kitchen.. 1 By Mouth Qam 11)  Lotrel 5-20 Mg Caps (Amlodipine Besy-Benazepril Hcl)  .Marland Kitchen.. 1 Cap Once Daily  Allergies: 1)  Vytorin  Past History:  Past Medical History: Last updated: 01/04/2009 Coronary artery disease STENT 2010 Hypertension Peripheral vascular disease carotid artery stenosis Hyperlipidemia glucose intolerance DJD right hip Low back pain/facet arthropathy Colonic polyps, hx of - hyperplastic 2004 glaucoma R scalp + R ophth H. Zoster 2009 R head postherpetic neuralgia 2009 RLL pneumonia and a small effusion 2009  Past Surgical History: Last updated: 07/17/2007 Coronary artery bypass graft - 2002 s/p left Carpal tunnel s/p left thumb tendon surgury s/p cyst x 2 off the back early 80's Tonsillectomy  Family History: Last updated: 07/17/2007 Family History Hypertension heart disease - paternal mother with breast cancer high cholesterol  Social History: Last updated: 07/17/2007 Retired - Forensic scientist Married Former Smoker Alcohol use-no 3 children  Risk Factors: Smoking Status: quit (07/01/2009)  Review of Systems       negative other than history of present illness  Vital Signs:  Patient profile:   77 year old male Height:      68 inches Weight:      205 pounds BMI:     31.28 Pulse rate:   65 / minute Pulse rhythm:   irregular BP sitting:   130 / 64  (left arm) Cuff size:   large  Vitals Entered By: Danielle Rankin, CMA (September 15, 2009 3:02 PM)  Physical Exam  General:  obese.   Head:  normocephalic and atraumatic Eyes:  PERRLA/EOM intact; conjunctiva  and lids normal. Neck:  Neck supple, no JVD. No masses, thyromegaly or abnormal cervical nodes. Chest Terik Haughey:  no deformities or breast masses noted Lungs:  Clear bilaterally to auscultation and percussion. Heart:  Non-displaced PMI, chest non-tender; regular rate and rhythm, S1, S2 without murmurs, rubs or gallops. Carotid upstroke normal, no bruit. Normal abdominal aortic size, no bruits. Femorals normal pulses, no bruits. Pedals normal pulses. No edema, no  varicosities. Abdomen:  Bowel sounds positive; abdomen soft and non-tender without masses, organomegaly, or hernias noted. No hepatosplenomegaly. Msk:  decreased ROM.   Pulses:  pulses normal in all 4 extremities Extremities:  No clubbing or cyanosis. Neurologic:  Alert and oriented x 3. Skin:  Intact without lesions or rashes. Psych:  Normal affect.   EKG  Procedure date:  09/15/2009  Findings:      normal sinus rhythm, right bundle branch block which is old, deep T wave inversion in one aVL which are more significant than last EKG.  Impression & Recommendations:  Problem # 1:  CORONARY ATHEROSCLEROSIS NATIVE CORONARY ARTERY (ICD-414.01)  He has a typical chest pain but EKG is worrisome for T-wave changes in one aVL as well as V5 and 6. We'll perform a stress Myoview since been several years since his last study. His updated medication list for this problem includes:    Aspirin 325 Mg Tabs (Aspirin) .Marland Kitchen... Take 1 tab by mouth every day    Plavix 75 Mg Tabs (Clopidogrel bisulfate) .Marland Kitchen... 1 qd    Nitrostat 0.4 Mg Subl (Nitroglycerin) .Marland Kitchen... 1 q5 min as needed chest pain    Lopressor Hct 100-25 Mg Tabs (Metoprolol-hydrochlorothiazide) .Marland Kitchen... 1 by mouth qam    Lotrel 5-20 Mg Caps (Amlodipine besy-benazepril hcl) .Marland Kitchen... 1 cap once daily  Orders: EKG w/ Interpretation (93000)  Problem # 2:  WEIGHT GAIN, ABNORMAL (ICD-783.1) Assessment: Improved  Problem # 3:  HYPERTENSION (ICD-401.9) Assessment: Improved His weight loss he does help his pressure. Have encouraged him to keep it down or lose more. His updated medication list for this problem includes:    Aspirin 325 Mg Tabs (Aspirin) .Marland Kitchen... Take 1 tab by mouth every day    Lopressor Hct 100-25 Mg Tabs (Metoprolol-hydrochlorothiazide) .Marland Kitchen... 1 by mouth qam    Lotrel 5-20 Mg Caps (Amlodipine besy-benazepril hcl) .Marland Kitchen... 1 cap once daily  Other Orders: Nuclear Stress Test (Nuc Stress Test)  Patient Instructions: 1)  Your physician  recommends that you schedule a follow-up appointment in: 6 months with dr Arden Tinoco 2)  Your physician recommends that you continue on your current medications as directed. Please refer to the Current Medication list given to you today. 3)  Your physician has requested that you have an adenosine myoview.  For further information please visit [email or web address]  Please follow instruction sheet, as given.

## 2010-05-18 NOTE — Letter (Signed)
Summary: Cardiac Catheterization Instructions- [facility]  [facility], Main Office  [address]   [place], [place] [address]   Phone: [phone]  Fax: [phone]     10/05/2009 MRN: [id number]  [name] [name] [address] [place], [place]  [address]  Dear [name] [name],   You are scheduled for a Cardiac Catheterization on ____6/28/11_____ with Dr.__hochrein____________  Please arrive to the 1st floor of the Heart and Vascular Center at [facility] at ___7:30__ am / pm on the day of your procedure. Please do not arrive before 6:30 a.m. Call the Heart and Vascular Center at [phone] if you are unable to make your appointmnet. The Code to get into the parking garage under the building is_0100_______. Take the elevators to the 1st floor. You must have someone to drive you home. Someone must be with you for the first 24 hours after you arrive home. Please wear clothes that are easy to get on and off and wear slip-on shoes. Do not eat or drink after midnight except water with your medications that morning. Bring all your medications and current insurance cards with you.  ___ DO NOT take these medications before your procedure: ________________________________________________________________  _x_ Make sure you take your aspirin.  __x_ You may take ALL of your medications with water that morning. ________________________________________________________________________________________________________________________________  ___ DO NOT take ANY medications before your procedure.  ___ Pre-med instructions:  ________________________________________________________________________________________________________________________________  The usual length of stay after your procedure is 2 to 3 hours. This can vary.  If you have any questions, please call the office at the number listed above.   Scherrie Bateman, LPN

## 2010-05-18 NOTE — Progress Notes (Signed)
Summary: cardiac cath   Phone Note Call from Patient   Caller: Patient Reason for Call: Talk to Nurse Summary of Call: request to speak to nurse about Cardiac Cath Initial call taken by: Migdalia Dk,  October 07, 2009 11:55 AM  Follow-up for Phone Call        10/07/09--12 NOON--PT HAD SEVERAL ? ABOUT CATH --ALL QUESTIONS ANSWERED--NT Follow-up by: Ledon Snare, RN,  October 07, 2009 12:01 PM     Appended Document: cardiac cath  Reviewed Juanito Doom, MD

## 2010-05-18 NOTE — Progress Notes (Signed)
Summary: ? colonoscopy   ---- Converted from flag ---- ---- 12/14/2009 2:24 PM, Vallarie Mare wrote: Dr. Leone Payor, Dr. Posey Rea has directed pt to sch a direct COL with you.  Pt is on Plavix and Asprin.  Per pt's paper chart, pt's cardiologist Dr. Daleen Squibb has already said it is ok for pt to hold his Plavix.  Do you think this pt needs an ov first with you or can he sch the COL?  Dottie to put chart on your desk. Thanks! Revonda Standard   I have reviewed his chart. He had hyperplastic colon polyps and would not need a routine colonoscopy to screen until 05/2012 per current guidelines. Please let patient know and place recall, and place in next colon due . Also let Dr. Posey Rea know this  and that we won't be doing a prostate exam (was deferred to GI) by routing to him.   Iva Boop MD, [facility]  December 14, 2009 2:42 PM   ------------------------------  Phone Note Outgoing Call   Summary of Call: LM to [facility] at home number.  Recall entered into IDX.  Last colonosocpy copied and pasted into EMR.  I will route to Dr. Posey Rea after speaking with patient. Initial call taken by: Francee Piccolo CMA Duncan Dull),  December 14, 2009 3:05 PM  Follow-up for Phone Call        [facility] from pt.  Advised pt that he is not due for colonoscopy until 2014 and we will mail him a letter closer to that time advising him to call and schedule an appt.  Pt voices understanding and is agreeable to this plan.    Note also routed to Dr. Posey Rea.  Follow-up by: Francee Piccolo CMA Duncan Dull),  December 15, 2009 12:02 PM

## 2010-05-18 NOTE — Cardiovascular Report (Signed)
Summary: Pre- Cath Orders  Pre- Cath Orders   Imported By: Marylou Mccoy 10/31/2009 16:17:52  _____________________________________________________________________  External Attachment:    Type:   Image     Comment:   External Document

## 2010-05-18 NOTE — Progress Notes (Signed)
Summary: Please call  Phone Note Call from Patient Call back at Home Phone [phone]   Caller: Patient Summary of Call: Pt called requesting Dr Posey Rea or his nurse contact his pharmacy at [phone] to have one of his medication released to him. Pt did not identify medication or pharmacy. Initial call taken by: Margaret Pyle, CMA,  January 05, 2010 9:55 AM  Follow-up for Phone Call        pt states [facility] did not received his prescriptions. I am [facility]. medications were called in for pt  Follow-up by: Ami Bullins CMA,  January 05, 2010 6:04 PM    Prescriptions: PLAVIX 75 MG TABS (CLOPIDOGREL BISULFATE) 1 qd  #90 x 3   Entered by:   Ami Bullins CMA   Authorized by:   Tresa Garter MD   Signed by:   Bill Salinas CMA on 01/05/2010   Method used:   Telephoned to ...       [facility] (mail-order)       [address]       [place], [place]  [address]  [place]       Ph:        Fax: [phone]   RxID:   [id number] LOPRESSOR HCT 100-25 MG TABS (METOPROLOL-HYDROCHLOROTHIAZIDE) 1 by mouth qam  #90 x 3   Entered by:   Ami Bullins CMA   Authorized by:   Tresa Garter MD   Signed by:   Bill Salinas CMA on 01/05/2010   Method used:   Telephoned to ...       [facility] (mail-order)       [address]       [place], [place]  [address]  [place]       Ph:        Fax: [phone]   RxID:   [id number]

## 2010-05-18 NOTE — Assessment & Plan Note (Signed)
Summary: 4 MTH FU--STC   Vital Signs:  Patient profile:   77 year old male Height:      68 inches Weight:      201 pounds BMI:     30.67 Temp:     98.9 degrees F oral Pulse rate:   68 / minute Pulse rhythm:   irregular Resp:     16 per minute BP sitting:   138 / 78  (left arm) Cuff size:   regular  Vitals Entered By: Lanier Prude, Beverly Gust) (December 27, 2009 7:51 AM) CC: 4 mo f/u Is Patient Diabetic? No   Primary Care Provider:  Tresa Garter MD  CC:  4 mo f/u.  History of Present Illness: The patient presents for a follow up of hypertension, hyperlipidemia, CAD  Current Medications (verified): 1)  Crestor 40 Mg Tabs (Rosuvastatin Calcium) .Marland Kitchen.. 1 By Mouth Qd 2)  Vitamin D3 1000 Unit  Tabs (Cholecalciferol) .Marland Kitchen.. 1 Qd 3)  Aspirin 325 Mg Tabs (Aspirin) .... Take 1 Tab By Mouth Every Day 4)  Folic Acid 400 Mcg  Tabs (Folic Acid) .Marland Kitchen.. 1 By Mouth Qd 5)  Travatan 0.004 %  Soln (Travoprost) .... Xto Eye Q Hs 6)  Multivitamins   Tabs (Multiple Vitamin) .Marland Kitchen.. 1 Tab Once Daily 7)  Vitamin B-12 500 Mcg Tabs (Cyanocobalamin) .... Once Daily 8)  Plavix 75 Mg Tabs (Clopidogrel Bisulfate) .Marland Kitchen.. 1 Qd 9)  Nitrostat 0.4 Mg Subl (Nitroglycerin) .Marland Kitchen.. 1 Q5 Min As Needed Chest Pain 10)  Lopressor Hct 100-25 Mg Tabs (Metoprolol-Hydrochlorothiazide) .Marland Kitchen.. 1 By Mouth Qam 11)  Lotrel 5-20 Mg Caps (Amlodipine Besy-Benazepril Hcl) .Marland Kitchen.. 1 Cap Once Daily  Allergies (verified): 1)  Vytorin  Past History:  Past Medical History: Last updated: 01/04/2009 Coronary artery disease STENT 2010 Hypertension Peripheral vascular disease carotid artery stenosis Hyperlipidemia glucose intolerance DJD right hip Low back pain/facet arthropathy Colonic polyps, hx of - hyperplastic 2004 glaucoma R scalp + R ophth H. Zoster 2009 R head postherpetic neuralgia 2009 RLL pneumonia and a small effusion 2009  Social History: Last updated: 07/17/2007 Retired - Forensic scientist Married Former  Smoker Alcohol use-no 3 children  Past Surgical History: Coronary artery bypass graft - 2002 s/p left Carpal tunnel s/p left thumb tendon surgury s/p cyst x 2 off the back early 80's Tonsillectomy Colon due 2014 Dr Juanda Chance  Review of Systems  The patient denies chest pain, dyspnea on exertion, peripheral edema, and abdominal pain.    Physical Exam  General:  alert, well-hydrated, appropriate dress, cooperative to examination, overweight-appearing, and uncomfortable-appearing.   Head:  Normocephalic and atraumatic without obvious abnormalities. No apparent alopecia or balding. Nose:  External nasal examination shows no deformity or inflammation. Nasal mucosa are pink and moist without lesions or exudates. Mouth:  Oral mucosa and oropharynx without lesions or exudates.  Teeth in good repair. Neck:  No deformities, masses, or tenderness noted. Lungs:  Normal respiratory effort, chest expands symmetrically. Lungs are clear to auscultation, no crackles or wheezes. Heart:  Normal rate and regular rhythm. S1 and S2 normal without gallop, murmur, click, rub or other extra sounds. Abdomen:  Bowel sounds positive,abdomen soft and non-tender without masses, organomegaly or hernias noted. Msk:  Walking w/cane Extremities:  No clubbing, cyanosis, edema, or deformity noted with normal full range of motion of all joints.   Neurologic:  No cranial nerve deficits noted. Station and gait are normal. Plantar reflexes are down-going bilaterally. DTRs are symmetrical throughout. Sensory, motor and coordinative functions appear intact. Skin:  Intact without suspicious lesions or rashes Psych:  Cognition and judgment appear intact. Alert and cooperative with normal attention span and concentration. No apparent delusions, illusions, hallucinations   Impression & Recommendations:  Problem # 1:  CORONARY ATHEROSCLEROSIS NATIVE CORONARY ARTERY (ICD-414.01) Assessment Unchanged  His updated medication list  for this problem includes:    Aspirin 325 Mg Tabs (Aspirin) .Marland Kitchen... Take 1 tab by mouth every day    Plavix 75 Mg Tabs (Clopidogrel bisulfate) .Marland Kitchen... 1 qd    Nitrostat 0.4 Mg Subl (Nitroglycerin) .Marland Kitchen... 1 q5 min as needed chest pain    Lopressor Hct 100-25 Mg Tabs (Metoprolol-hydrochlorothiazide) .Marland Kitchen... 1 by mouth qam    Lotrel 5-20 Mg Caps (Amlodipine besy-benazepril hcl) .Marland Kitchen... 1 cap once daily  Problem # 2:  HYPERLIPIDEMIA (ICD-272.4) Assessment: Unchanged  His updated medication list for this problem includes:    Crestor 40 Mg Tabs (Rosuvastatin calcium) .Marland Kitchen... 1 by mouth qd  Labs Reviewed: SGOT: 29 (12/23/2009)   SGPT: 23 (12/23/2009)  Prior 10 Yr Risk Heart Disease: N/A (11/17/2008)   HDL:41.90 (12/23/2009), 49.50 (09/28/2009)  LDL:127 (12/23/2009), 131 (03/25/2009)  Chol:196 (12/23/2009), 204 (09/28/2009)  Trig:135.0 (12/23/2009), 106.0 (09/28/2009)  Problem # 3:  HYPERTENSION (ICD-401.9) Assessment: Improved  His updated medication list for this problem includes:    Lopressor Hct 100-25 Mg Tabs (Metoprolol-hydrochlorothiazide) .Marland Kitchen... 1 by mouth qam    Lotrel 5-20 Mg Caps (Amlodipine besy-benazepril hcl) .Marland Kitchen... 1 cap once daily  BP today: 138/78 Prior BP: 122/70 (11/10/2009)  Prior 10 Yr Risk Heart Disease: N/A (11/17/2008)  Labs Reviewed: K+: 4.0 (12/23/2009) Creat: : 1.1 (12/23/2009)   Chol: 196 (12/23/2009)   HDL: 41.90 (12/23/2009)   LDL: 127 (12/23/2009)   TG: 135.0 (12/23/2009)  Problem # 4:  ABNORMAL GLUCOSE NEC (ICD-790.29) Assessment: Improved The labs were reviewed with the patient.   Complete Medication List: 1)  Crestor 40 Mg Tabs (Rosuvastatin calcium) .Marland Kitchen.. 1 by mouth qd 2)  Vitamin D3 1000 Unit Tabs (Cholecalciferol) .Marland Kitchen.. 1 qd 3)  Aspirin 325 Mg Tabs (Aspirin) .... Take 1 tab by mouth every day 4)  Folic Acid 400 Mcg Tabs (Folic acid) .Marland Kitchen.. 1 by mouth qd 5)  Travatan 0.004 % Soln (Travoprost) .... Xto eye q hs 6)  Multivitamins Tabs (Multiple vitamin) .Marland Kitchen.. 1  tab once daily 7)  Vitamin B-12 500 Mcg Tabs (Cyanocobalamin) .... Once daily 8)  Plavix 75 Mg Tabs (Clopidogrel bisulfate) .Marland Kitchen.. 1 qd 9)  Nitrostat 0.4 Mg Subl (Nitroglycerin) .Marland Kitchen.. 1 q5 min as needed chest pain 10)  Lopressor Hct 100-25 Mg Tabs (Metoprolol-hydrochlorothiazide) .Marland Kitchen.. 1 by mouth qam 11)  Lotrel 5-20 Mg Caps (Amlodipine besy-benazepril hcl) .Marland Kitchen.. 1 cap once daily  Patient Instructions: 1)  Please schedule a follow-up appointment in 3 months. 2)  BMP prior to visit, ICD-9:401.1  Contraindications/Deferment of Procedures/Staging:    Test/Procedure: FLU VAX    Reason for deferment: patient declined    Not Administered:    Influenza Vaccine not given due to: declined

## 2010-05-18 NOTE — Assessment & Plan Note (Signed)
Summary: 3 MTH FU--STC   Vital Signs:  Patient profile:   77 year old male Weight:      213 pounds Temp:     99 degrees F oral Pulse rate:   74 / minute BP sitting:   156 / 82  (left arm)  Vitals Entered By: Tora Perches (July 01, 2009 2:59 PM) CC: f/u Is Patient Diabetic? No   Primary Care Provider:  Tresa Garter MD  CC:  f/u.  History of Present Illness: The patient presents for a follow up of hypertension, elev. glu, hyperlipidemia. He put the wt on    Preventive Screening-Counseling & Management  Alcohol-Tobacco     Smoking Status: quit  Current Medications (verified): 1)  Crestor 40 Mg Tabs (Rosuvastatin Calcium) .Marland Kitchen.. 1 By Mouth Qd 2)  Vitamin D3 1000 Unit  Tabs (Cholecalciferol) .Marland Kitchen.. 1 Qd 3)  Aspirin 325 Mg Tabs (Aspirin) .... Take 1 Tab By Mouth Every Day 4)  Folic Acid 400 Mcg  Tabs (Folic Acid) .Marland Kitchen.. 1 By Mouth Qd 5)  Travatan 0.004 %  Soln (Travoprost) .... Xto Eye Q Hs 6)  Multivitamins   Tabs (Multiple Vitamin) .Marland Kitchen.. 1 Tab Once Daily 7)  Vitamin B-12 500 Mcg Tabs (Cyanocobalamin) .... Once Daily 8)  Plavix 75 Mg Tabs (Clopidogrel Bisulfate) .Marland Kitchen.. 1 Qd 9)  Nitrostat 0.4 Mg Subl (Nitroglycerin) .Marland Kitchen.. 1 Q5 Min As Needed Chest Pain 10)  Lopressor Hct 100-25 Mg Tabs (Metoprolol-Hydrochlorothiazide) .Marland Kitchen.. 1 By Mouth Qam 11)  Lotrel 5-20 Mg Caps (Amlodipine Besy-Benazepril Hcl) .Marland Kitchen.. 1 By Mouth Once Daily For Blood Pressure 12)  Benazepril Hcl 20 Mg Tabs (Benazepril Hcl) .Marland Kitchen.. 1 Once Daily  Allergies: 1)  Vytorin  Past History:  Past Medical History: Last updated: 01/04/2009 Coronary artery disease STENT 2010 Hypertension Peripheral vascular disease carotid artery stenosis Hyperlipidemia glucose intolerance DJD right hip Low back pain/facet arthropathy Colonic polyps, hx of - hyperplastic 2004 glaucoma R scalp + R ophth H. Zoster 2009 R head postherpetic neuralgia 2009 RLL pneumonia and a small effusion 2009  Social History: Last updated:  07/17/2007 Retired - Forensic scientist Married Former Smoker Alcohol use-no 3 children  Review of Systems       The patient complains of weight gain.  The patient denies chest pain, dyspnea on exertion, and prolonged cough.    Physical Exam  General:  alert, well-hydrated, appropriate dress, cooperative to examination, overweight-appearing, and uncomfortable-appearing.   Mouth:  Oral mucosa and oropharynx without lesions or exudates.  Teeth in good repair. Neck:  No deformities, masses, or tenderness noted. Lungs:  Normal respiratory effort, chest expands symmetrically. Lungs are clear to auscultation, no crackles or wheezes. Heart:   no gallop, tachycardia, and grade  /6 HSM.   Abdomen:  soft, non-tender, normal bowel sounds, no distention, no masses, no guarding, no rigidity, no rebound tenderness, no abdominal hernia, no inguinal hernia, no hepatomegaly, and no splenomegaly.   Msk:  Walking w/cane Neurologic:  No cranial nerve deficits noted. Station and gait are normal. Plantar reflexes are down-going bilaterally. DTRs are symmetrical throughout. Sensory, motor and coordinative functions appear intact. Skin:  Intact without suspicious lesions or rashes Psych:  Cognition and judgment appear intact. Alert and cooperative with normal attention span and concentration. No apparent delusions, illusions, hallucinations   Impression & Recommendations:  Problem # 1:  HYPERTENSION (ICD-401.9) Assessment Unchanged  The following medications were removed from the medication list:    Lotrel 5-20 Mg Caps (Amlodipine besy-benazepril hcl) .Marland Kitchen... 1 by mouth  once daily for blood pressure    Benazepril Hcl 20 Mg Tabs (Benazepril hcl) .Marland Kitchen... 1 once daily His updated medication list for this problem includes:    Lopressor Hct 100-25 Mg Tabs (Metoprolol-hydrochlorothiazide) .Marland Kitchen... 1 by mouth qam    Lotrel 10-40 Mg Caps (Amlodipine besy-benazepril hcl) .Marland Kitchen... 1 by mouth qd  Problem # 2:  CORONARY ARTERY  DISEASE (ICD-414.00) Assessment: Unchanged  The following medications were removed from the medication list:    Lotrel 5-20 Mg Caps (Amlodipine besy-benazepril hcl) .Marland Kitchen... 1 by mouth once daily for blood pressure    Benazepril Hcl 20 Mg Tabs (Benazepril hcl) .Marland Kitchen... 1 once daily His updated medication list for this problem includes:    Aspirin 325 Mg Tabs (Aspirin) .Marland Kitchen... Take 1 tab by mouth every day    Plavix 75 Mg Tabs (Clopidogrel bisulfate) .Marland Kitchen... 1 qd    Nitrostat 0.4 Mg Subl (Nitroglycerin) .Marland Kitchen... 1 q5 min as needed chest pain    Lopressor Hct 100-25 Mg Tabs (Metoprolol-hydrochlorothiazide) .Marland Kitchen... 1 by mouth qam    Lotrel 10-40 Mg Caps (Amlodipine besy-benazepril hcl) .Marland Kitchen... 1 by mouth qd  Problem # 3:  WEIGHT GAIN, ABNORMAL (ICD-783.1) Assessment: New See "Patient Instructions".  Problem # 4:  ABNORMAL GLUCOSE NEC (ICD-790.29) Assessment: Comment Only See "Patient Instructions".   Problem # 5:  HYPERLIPIDEMIA (ICD-272.4) Assessment: Unchanged  His updated medication list for this problem includes:    Crestor 40 Mg Tabs (Rosuvastatin calcium) .Marland Kitchen... 1 by mouth qd  Complete Medication List: 1)  Crestor 40 Mg Tabs (Rosuvastatin calcium) .Marland Kitchen.. 1 by mouth qd 2)  Vitamin D3 1000 Unit Tabs (Cholecalciferol) .Marland Kitchen.. 1 qd 3)  Aspirin 325 Mg Tabs (Aspirin) .... Take 1 tab by mouth every day 4)  Folic Acid 400 Mcg Tabs (Folic acid) .Marland Kitchen.. 1 by mouth qd 5)  Travatan 0.004 % Soln (Travoprost) .... Xto eye q hs 6)  Multivitamins Tabs (Multiple vitamin) .Marland Kitchen.. 1 tab once daily 7)  Vitamin B-12 500 Mcg Tabs (Cyanocobalamin) .... Once daily 8)  Plavix 75 Mg Tabs (Clopidogrel bisulfate) .Marland Kitchen.. 1 qd 9)  Nitrostat 0.4 Mg Subl (Nitroglycerin) .Marland Kitchen.. 1 q5 min as needed chest pain 10)  Lopressor Hct 100-25 Mg Tabs (Metoprolol-hydrochlorothiazide) .Marland Kitchen.. 1 by mouth qam 11)  Lotrel 10-40 Mg Caps (Amlodipine besy-benazepril hcl) .Marland Kitchen.. 1 by mouth qd  Patient Instructions: 1)  Cut back on juces 2)  Try to eat more raw  plant food, fresh and dry fruit, raw almonds, leafy vegetables, whole foods and less red meat, less animal fat. Poultry and fish is better for you than pork and beef. Avoid processed foods (canned soups, hot dogs, sausage, bacon , frozen dinners). Avoid corn syrup, high fructose syrup or aspartam and Splenda  containing drinks. Honey, Agave and Stevia are better sweeteners. Make your own  dressing with olive oil, wine vinegar, lemon juce, garlic etc. for your salads. 3)  You need to lose weight. Consider a lower calorie diet and regular exercise.  4)  Please schedule a follow-up appointment in 3 months well w/labs. 5)  BMP prior to visit, ICD-9: v70.0  401.1 250.00 6)  Hepatic Panel prior to visit, ICD-9: 7)  Lipid Panel prior to visit, ICD-9: 8)  TSH prior to visit, ICD-9: 9)  CBC w/ Diff prior to visit, ICD-9: 10)  Urine-dip prior to visit, ICD-9: 11)  PSA prior to visit, ICD-9: 12)  HbgA1C prior to visit, ICD-9: Prescriptions: LOTREL 10-40 MG CAPS (AMLODIPINE BESY-BENAZEPRIL HCL) 1 by mouth qd  #30  x 12   Entered and Authorized by:   Tresa Garter MD   Signed by:   Tresa Garter MD on 07/01/2009   Method used:   Print then Give to Patient   RxID:   [id number]

## 2010-05-18 NOTE — Letter (Signed)
Summary: CMN/[facility]  CMN/[facility]   Imported By: Lester Ulster 05/13/2009 15:15:10  _____________________________________________________________________  External Attachment:    Type:   Image     Comment:   External Document

## 2010-05-18 NOTE — Medication Information (Signed)
Summary: Amlodipine / [facility]  Amlodipine / [facility]   Imported By: Lennie Odor 04/27/2010 16:46:48  _____________________________________________________________________  External Attachment:    Type:   Image     Comment:   External Document

## 2010-05-18 NOTE — Assessment & Plan Note (Signed)
Summary: [name] [name]    Visit Type:  EPH Primary Provider:  Tresa Garter MD  CC:  pt had cath 10/11/09...edema/hands at times...denies any cp or sob.  History of Present Illness: [name] comes in today post catheterization. Stress Myoview showed some mild ischemia in the anterior [name] [name].  Catheterization showed stable anatomy. Please refer to the report below. He has normal left ventricular function with one patent stent and 2 patent bypass grafts. The other bypass grafts were occluded and these are old. Medical therapy and aggressive risk factor modification recommended.  He also has nonobstructive carotid disease. Last Dopplers were in December and are stable. He is having no symptoms of TIAs or mini strokes.  He's lost several pounds and feels better.  Clinical Reports Reviewed:  Cardiac Cath:  10/11/2009: Cardiac Cath Findings:  The EF was 65% with normal [name] [name] motion.      CONCLUSION:  Severe native three-vessel coronary artery disease.  Patent   stent in the circumflex.  Two of the bypass grafts remain patent.  The   others are occluded as they had been previously.  He has got a well-   preserved ejection fraction.      PLAN:  The patient will continue to have aggressive medical management.   No percutaneous revascularization is indicated.               [name] Rotunda, MD, [facility]      11/18/2008: Cardiac Cath Findings:   CONCLUSION:   1. Coronary artery disease, status post coronary artery bypass graft       surgery in 2002.   2. Severe native vessel disease with 90% proximal stenosis in the left       anterior descending, 90% proximal stenosis in the circumflex artery       with 70% stenosis in the second marginal branch, diffuse 90%       stenosis in the right coronary artery.   3. Patent radial graft to the posterior descending branch of the right       coronary artery, patent left internal mammary artery graft to the       left anterior descending,  occluded vein graft to the diagonal       branch of the left anterior descending, and occluded vein graft to       the marginal branch of circumflex artery.   4. Good left ventricular function with estimated fraction of 60-70%.   5. Successful percutaneous coronary intervention of the lesion in the       proximal native circumflex artery using a Xience drug-eluting stent       with improvement of center narrowing from 90% to 0%.      DISPOSITION:  The patient returned to the [facility] room for further   observation.  The patient will remain on Plavix for at least a year.               [name] [name] [name] Chance, MD, [facility]        Cardiac Cath Findings:   Left ventriculogram:  The left ventriculogram was performed on the RAO   projection showed a good Kaybree Williams motion which was very vigorous.  Estimated   fraction was 60-70%.      Following stenting of the lesion in the proximal circumflex artery, the   stenosis improved from 90% to 0%.      CONCLUSION:   1. Coronary artery disease, status post coronary artery bypass graft  surgery in 2002.   2. Severe native vessel disease with 90% proximal stenosis in the left       anterior descending, 90% proximal stenosis in the circumflex artery       with 70% stenosis in the second marginal branch, diffuse 90%       stenosis in the right coronary artery.   3. Patent radial graft to the posterior descending branch of the right       coronary artery, patent left internal mammary artery graft to the       left anterior descending, occluded vein graft to the diagonal       branch of the left anterior descending, and occluded vein graft to       the marginal branch of circumflex artery.   4. Good left ventricular function with estimated fraction of 60-70%.   5. Successful percutaneous coronary intervention of the lesion in the       proximal native circumflex artery using a Xience drug-eluting stent       with improvement of center narrowing from 90% to  0%.      DISPOSITION:  The patient returned to the [facility] room for further   observation.  The patient will remain on Plavix for at least a year.               [name] [name] [name] Chance, MD, [facility]   Electronically Signed      Carotid Doppler:  03/21/2009:  Impressions: Stable carotid artery disease, over several exams. 40-59% bilateral ICA stenosis.  [name] Bollman, MD  03/21/2009:  Impressions: Stable carotid artery disease, over several exams. 40-59% bilateral ICA stenosis.  [name] Bollman, MD  03/21/2009:  Impressions:  Stable carotid artery disease, over serial exams. 40-59% bilateral ICA stenosis.  [name] Bollman, MD   Current Medications (verified): 1)  Crestor 40 Mg Tabs (Rosuvastatin Calcium) .[name] [name].. 1 By Mouth Qd 2)  Vitamin D3 1000 Unit  Tabs (Cholecalciferol) .[name] [name].. 1 Qd 3)  Aspirin 325 Mg Tabs (Aspirin) .... Take 1 Tab By Mouth Every Day 4)  Folic Acid 400 Mcg  Tabs (Folic Acid) .[name] [name].. 1 By Mouth Qd 5)  Travatan 0.004 %  Soln (Travoprost) .... Xto Eye Q Hs 6)  Multivitamins   Tabs (Multiple Vitamin) .[name] [name].. 1 Tab Once Daily 7)  Vitamin B-12 500 Mcg Tabs (Cyanocobalamin) .... Once Daily 8)  Plavix 75 Mg Tabs (Clopidogrel Bisulfate) .[name] [name].. 1 Qd 9)  Nitrostat 0.4 Mg Subl (Nitroglycerin) .[name] [name].. 1 Q5 Min As Needed Chest Pain 10)  Lopressor Hct 100-25 Mg Tabs (Metoprolol-Hydrochlorothiazide) .[name] [name].. 1 By Mouth Qam 11)  Lotrel 5-20 Mg Caps (Amlodipine Besy-Benazepril Hcl) .[name] [name].. 1 Cap Once Daily  Allergies: 1)  Vytorin  Review of Systems       negative other than history of present illness  Vital Signs:  Patient profile:   77 year old male Height:      68 inches Weight:      202 pounds BMI:     30.83 Pulse rate:   59 / minute Pulse rhythm:   irregular BP sitting:   122 / 70  (left arm) Cuff size:   large  Vitals Entered By: Danielle Rankin, CMA (November 10, 2009 12:18 PM)  Physical Exam  General:  obese.   Head:  normocephalic and atraumatic Eyes:  glasses otherwise  normal Neck:  Neck supple, no JVD. No masses, thyromegaly or abnormal cervical nodes. Chest Tifany Hirsch:  no deformities or breast masses noted Lungs:  Clear bilaterally to  auscultation and percussion. Heart:  PMI not displaced, normal S1-S2, no murmur or gallop. Msk:  decreased ROM.   Pulses:  pulses normal in all 4 extremities Extremities:  1+ left pedal edema and trace right pedal edema.   Neurologic:  Alert and oriented x 3. Skin:  Intact without lesions or rashes. Psych:  Normal affect.   EKG  Procedure date:  11/10/2009  Findings:      sinus bradycardia, right bundle branch block, stable  Impression & Recommendations:  Problem # 1:  CORONARY ATHEROSCLEROSIS NATIVE CORONARY ARTERY (ICD-414.01) Assessment Unchanged  His updated medication list for this problem includes:    Aspirin 325 Mg Tabs (Aspirin) .[name] [name]... Take 1 tab by mouth every day    Plavix 75 Mg Tabs (Clopidogrel bisulfate) .[name] [name]... 1 qd    Nitrostat 0.4 Mg Subl (Nitroglycerin) .[name] [name]... 1 q5 min as needed chest pain    Lopressor Hct 100-25 Mg Tabs (Metoprolol-hydrochlorothiazide) .[name] [name]... 1 by mouth qam    Lotrel 5-20 Mg Caps (Amlodipine besy-benazepril hcl) .[name] [name]... 1 cap once daily  Orders: EKG w/ Interpretation (93000)  Problem # 2:  CAROTID ARTERY STENOSIS, WITHOUT INFARCTION (ICD-433.10) Assessment: Unchanged  His updated medication list for this problem includes:    Aspirin 325 Mg Tabs (Aspirin) .[name] [name]... Take 1 tab by mouth every day    Plavix 75 Mg Tabs (Clopidogrel bisulfate) .[name] [name]... 1 qd  Problem # 3:  HYPERTENSION (ICD-401.9)  His updated medication list for this problem includes:    Aspirin 325 Mg Tabs (Aspirin) .[name] [name]... Take 1 tab by mouth every day    Lopressor Hct 100-25 Mg Tabs (Metoprolol-hydrochlorothiazide) .[name] [name]... 1 by mouth qam    Lotrel 5-20 Mg Caps (Amlodipine besy-benazepril hcl) .[name] [name]... 1 cap once daily  Problem # 4:  HYPERLIPIDEMIA (ICD-272.4) LDL not at goal Crestor maximize. Diet emphasized His updated  medication list for this problem includes:    Crestor 40 Mg Tabs (Rosuvastatin calcium) .[name] [name]... 1 by mouth qd  Patient Instructions: 1)  Your physician recommends that you schedule a follow-up appointment in: YEAR WITH DR Hameed Kolar 2)  Your physician recommends that you continue on your current medications as directed. Please refer to the Current Medication list given to you today.

## 2010-05-18 NOTE — Progress Notes (Signed)
Summary: questions re travel after cath   Phone Note Call from Patient   Caller: Patient [phone] Reason for Call: Talk to Nurse Summary of Call: pt has cardiac cath tomorrow-his sister passed away and he will need to travel about 5 hrs the day after the cath-is this alright?-pls call [phone] Initial call taken by: Glynda Jaeger,  October 10, 2009 1:03 PM  Follow-up for Phone Call        PER PT WILL DELAY TRIP AND WILL PROCEED WITH CATH AS SCHEDULED. Follow-up by: Scherrie Bateman, LPN,  October 10, 2009 3:24 PM

## 2010-05-18 NOTE — Miscellaneous (Signed)
[facility]   Imported By: Lester Hull 07/08/2009 09:11:25  _____________________________________________________________________  External Attachment:    Type:   Image     Comment:   External Document

## 2010-05-18 NOTE — Progress Notes (Signed)
Summary: Nuclear Pre-Procedure  Phone Note Outgoing Call Call back at [facility] Phone [phone]   Call placed by: Stanton Kidney, EMT-P,  September 27, 2009 3:22 PM Action Taken: Phone Call Completed Summary of Call: Left message with information on Myoview Information Sheet (see scanned document for details).     Nuclear Med Background Indications for Stress Test: Evaluation for Ischemia, Graft Patency, Abnormal EKG   History: CABG, Echo, Heart Catheterization, Myocardial Perfusion Study, Stents  History Comments: '02 CABG x5 12/07 Echo: EF= 60% 11/08 MPS: EF=64%, NL 8/10 Heart Cath > Stent:CFX  Symptoms: Chest Tightness    Nuclear Pre-Procedure Cardiac Risk Factors: Carotid Disease, History of Smoking, Hypertension, Lipids, PVD, RBBB Height (in): 68

## 2010-05-18 NOTE — Progress Notes (Signed)
Summary: colonoscopy   Phone Note Call from Patient Call back at Home Phone [phone]   Caller: Patient Reason for Call: Talk to Nurse Summary of Call: needs to know how long he should wait to have colonoscopy done, since he had cath done on 6/28 Initial call taken by: Migdalia Dk,  October 13, 2009 4:45 PM  Follow-up for Phone Call        [facility] for call back.  Will verify with Dr Daleen Squibb next week.  Sander Nephew, RN Follow-up by: Suzan Garibaldi RN  Additional Follow-up for Phone Call Additional follow up Details #1::        He can have anytime. Will need to hold Plavix but stay on ASA. Additional Follow-up by: Gaylord Shih, MD, [facility],  October 18, 2009 9:08 AM     Appended Document: colonoscopy Called patient and left message on machine of above information and to call back if questions

## 2010-05-18 NOTE — Assessment & Plan Note (Signed)
Summary: 3 mo rov /nws  #   Vital Signs:  Patient profile:   77 year old male Height:      68 inches Weight:      201 pounds BMI:     30.67 Temp:     98.9 degrees F oral Pulse rate:   72 / minute Pulse rhythm:   regular Resp:     16 per minute BP sitting:   146 / 78  (left arm) Cuff size:   regular  Vitals Entered By: Lanier Prude, Beverly Gust) (March 31, 2010 1:58 PM) CC: 3 mo f/u  Is Patient Diabetic? No   Primary Care Provider:  Tresa Garter MD  CC:  3 mo f/u .  History of Present Illness: The patient presents for a follow up of hypertension, diabetes, hyperlipidemia C/o cough and dry throat  he thinks it is Lopressor HCT  Current Medications (verified): 1)  Crestor 40 Mg Tabs (Rosuvastatin Calcium) .Marland Kitchen.. 1 By Mouth Qd 2)  Vitamin D3 1000 Unit  Tabs (Cholecalciferol) .Marland Kitchen.. 1 Qd 3)  Aspirin 325 Mg Tabs (Aspirin) .... Take 1 Tab By Mouth Every Day 4)  Folic Acid 400 Mcg  Tabs (Folic Acid) .Marland Kitchen.. 1 By Mouth Qd 5)  Travatan 0.004 %  Soln (Travoprost) .... Xto Eye Q Hs 6)  Multivitamins   Tabs (Multiple Vitamin) .Marland Kitchen.. 1 Tab Once Daily 7)  Vitamin B-12 500 Mcg Tabs (Cyanocobalamin) .... Once Daily 8)  Plavix 75 Mg Tabs (Clopidogrel Bisulfate) .Marland Kitchen.. 1 Qd 9)  Nitrostat 0.4 Mg Subl (Nitroglycerin) .Marland Kitchen.. 1 Tablet Under Tongue At Onset of Chest Pain; You May Repeat Every 5 Minutes For Up To 3 Doses. 10)  Lopressor Hct 100-25 Mg Tabs (Metoprolol-Hydrochlorothiazide) .Marland Kitchen.. 1 By Mouth Qam 11)  Lotrel 5-20 Mg Caps (Amlodipine Besy-Benazepril Hcl) .Marland Kitchen.. 1 Cap Once Daily 12)  Zetia 10 Mg Tabs (Ezetimibe) .... Take 1 Tablet Daily  Allergies (verified): 1)  Vytorin 2)  Benazepril Hcl  Past History:  Past Medical History: Last updated: 01/04/2009 Coronary artery disease STENT 2010 Hypertension Peripheral vascular disease carotid artery stenosis Hyperlipidemia glucose intolerance DJD right hip Low back pain/facet arthropathy Colonic polyps, hx of - hyperplastic  2004 glaucoma R scalp + R ophth H. Zoster 2009 R head postherpetic neuralgia 2009 RLL pneumonia and a small effusion 2009  Social History: Last updated: 07/17/2007 Retired - Forensic scientist Married Former Smoker Alcohol use-no 3 children  Review of Systems       The patient complains of prolonged cough.  The patient denies fever, chest pain, and abdominal pain.    Physical Exam  General:  alert, well-hydrated, appropriate dress, cooperative to examination, overweight-appearing, and uncomfortable-appearing.   Nose:  External nasal examination shows no deformity or inflammation. Nasal mucosa are pink and moist without lesions or exudates. Mouth:  Oral mucosa and oropharynx without lesions or exudates.  Teeth in good repair. Lungs:  Normal respiratory effort, chest expands symmetrically. Lungs are clear to auscultation, no crackles or wheezes. Heart:  Normal rate and regular rhythm. S1 and S2 normal without gallop, murmur, click, rub or other extra sounds. Abdomen:  Bowel sounds positive,abdomen soft and non-tender without masses, organomegaly or hernias noted. Msk:  Walking w/cane Pulses:  R radial normal, R femoral normal, R carotid normal, L radial normal, L femoral normal, L carotid normal, R popliteal decreased, R posterior tibial decreased, R dorsalis pedis decreased, L popliteal decreased, and L posterior tibial decreased.   Neurologic:  No cranial nerve deficits noted. Station and  gait are normal. Plantar reflexes are down-going bilaterally. DTRs are symmetrical throughout. Sensory, motor and coordinative functions appear intact. Skin:  Intact without suspicious lesions or rashes Psych:  Cognition and judgment appear intact. Alert and cooperative with normal attention span and concentration. No apparent delusions, illusions, hallucinations   Impression & Recommendations:  Problem # 1:  COUGH (ICD-786.2) due to Benazepril Assessment New D/c Benazepril  Problem # 2:   HYPERTENSION (ICD-401.9) Assessment: Comment Only  The following medications were removed from the medication list:    Lotrel 5-20 Mg Caps (Amlodipine besy-benazepril hcl) .Marland Kitchen... 1 cap once daily His updated medication list for this problem includes:    Lopressor Hct 100-25 Mg Tabs (Metoprolol-hydrochlorothiazide) .Marland Kitchen... 1 by mouth qam    Amlodipine Besylate 5 Mg Tabs (Amlodipine besylate) .Marland Kitchen... 1 by mouth qd  Problem # 3:  CORONARY ATHEROSCLEROSIS NATIVE CORONARY ARTERY (ICD-414.01) Assessment: Unchanged  The following medications were removed from the medication list:    Lotrel 5-20 Mg Caps (Amlodipine besy-benazepril hcl) .Marland Kitchen... 1 cap once daily His updated medication list for this problem includes:    Aspirin 325 Mg Tabs (Aspirin) .Marland Kitchen... Take 1 tab by mouth every day    Plavix 75 Mg Tabs (Clopidogrel bisulfate) .Marland Kitchen... 1 qd    Nitrostat 0.4 Mg Subl (Nitroglycerin) .Marland Kitchen... 1 tablet under tongue at onset of chest pain; you may repeat every 5 minutes for up to 3 doses.    Lopressor Hct 100-25 Mg Tabs (Metoprolol-hydrochlorothiazide) .Marland Kitchen... 1 by mouth qam    Amlodipine Besylate 5 Mg Tabs (Amlodipine besylate) .Marland Kitchen... 1 by mouth qd  Problem # 4:  HYPERLIPIDEMIA (ICD-272.4) Assessment: Comment Only  His updated medication list for this problem includes:    Crestor 40 Mg Tabs (Rosuvastatin calcium) .Marland Kitchen... 1 by mouth qd    Zetia 10 Mg Tabs (Ezetimibe) .Marland Kitchen... Take 1 tablet daily  Problem # 5:  ABNORMAL GLUCOSE NEC (ICD-790.29) Assessment: Unchanged The labs were reviewed with the patient.   Complete Medication List: 1)  Crestor 40 Mg Tabs (Rosuvastatin calcium) .Marland Kitchen.. 1 by mouth qd 2)  Vitamin D3 1000 Unit Tabs (Cholecalciferol) .Marland Kitchen.. 1 qd 3)  Aspirin 325 Mg Tabs (Aspirin) .... Take 1 tab by mouth every day 4)  Folic Acid 400 Mcg Tabs (Folic acid) .Marland Kitchen.. 1 by mouth qd 5)  Travatan 0.004 % Soln (Travoprost) .... Xto eye q hs 6)  Multivitamins Tabs (Multiple vitamin) .Marland Kitchen.. 1 tab once daily 7)  Vitamin  B-12 500 Mcg Tabs (Cyanocobalamin) .... Once daily 8)  Plavix 75 Mg Tabs (Clopidogrel bisulfate) .Marland Kitchen.. 1 qd 9)  Nitrostat 0.4 Mg Subl (Nitroglycerin) .Marland Kitchen.. 1 tablet under tongue at onset of chest pain; you may repeat every 5 minutes for up to 3 doses. 10)  Lopressor Hct 100-25 Mg Tabs (Metoprolol-hydrochlorothiazide) .Marland Kitchen.. 1 by mouth qam 11)  Zetia 10 Mg Tabs (Ezetimibe) .... Take 1 tablet daily 12)  Amlodipine Besylate 5 Mg Tabs (Amlodipine besylate) .Marland Kitchen.. 1 by mouth qd  Patient Instructions: 1)  Please schedule a follow-up appointment in 3 months. 2)  BMP prior to visit, ICD-9: 3)  HbgA1C prior to visit, ICD-9:250.00 Prescriptions: ZETIA 10 MG TABS (EZETIMIBE) take 1 tablet daily  #90 x 3   Entered and Authorized by:   Tresa Garter MD   Signed by:   Tresa Garter MD on 03/31/2010   Method used:   Print then Give to Patient   RxID:   [id number] AMLODIPINE BESYLATE 5 MG TABS (AMLODIPINE BESYLATE) 1 by mouth qd  #  90 x 3   Entered and Authorized by:   Tresa Garter MD   Signed by:   Tresa Garter MD on 03/31/2010   Method used:   Print then Give to Patient   RxID:   [id number] AMLODIPINE BESYLATE 5 MG TABS (AMLODIPINE BESYLATE) 1 by mouth qd  #30 x 12   Entered and Authorized by:   Tresa Garter MD   Signed by:   Tresa Garter MD on 03/31/2010   Method used:   Print then Give to Patient   RxID:   [id number]    Orders Added: 1)  Est. Patient Level IV [10272]    Contraindications/Deferment of Procedures/Staging:    Test/Procedure: FLU VAX    Reason for deferment: patient declined

## 2010-05-18 NOTE — Assessment & Plan Note (Signed)
Summary: yearly./sl  Medications Added ASPIRIN 325 MG TABS (ASPIRIN) Take 1 tab by mouth every day NITROSTAT 0.4 MG SUBL (NITROGLYCERIN) 1 tablet under tongue at onset of chest pain; you may repeat every 5 minutes for up to 3 doses. ZETIA 10 MG TABS (EZETIMIBE) take 1 tablet daily        Visit Type:  4 mo f/u Primary Seydina Holliman:  Tresa Garter MD  CC:  pt states he has some edema under the left arm and breast area says there is no discomfort.....denies any other cardiac complaints today.  History of Present Illness: Mr. Cairns comes in today for followup of his coronary artery disease.  He is having no angina or ischemic symptoms. Had a cardiac catheterization this past summer for positive stress Myoview. His stent was patent, 2 bypass grafts open, and one old occluded graft was noted. Medical therapy was continued. He has bizarre daily function.  He has not had to use any nitroglycerin. His weight is down 13 pounds. Recent laboratory data reviewed with patient. Total cholesterol is 196, triglycerides 135, HDL 41.9, LDL 127. LFTs were normal. He is on 40 mg of Crestor and takes his medicine religiously.    Current Medications (verified): 1)  Crestor 40 Mg Tabs (Rosuvastatin Calcium) .Marland Kitchen.. 1 By Mouth Qd 2)  Vitamin D3 1000 Unit  Tabs (Cholecalciferol) .Marland Kitchen.. 1 Qd 3)  Aspirin 325 Mg Tabs (Aspirin) .... Take 1 Tab By Mouth Every Day 4)  Folic Acid 400 Mcg  Tabs (Folic Acid) .Marland Kitchen.. 1 By Mouth Qd 5)  Travatan 0.004 %  Soln (Travoprost) .... Xto Eye Q Hs 6)  Multivitamins   Tabs (Multiple Vitamin) .Marland Kitchen.. 1 Tab Once Daily 7)  Vitamin B-12 500 Mcg Tabs (Cyanocobalamin) .... Once Daily 8)  Plavix 75 Mg Tabs (Clopidogrel Bisulfate) .Marland Kitchen.. 1 Qd 9)  Nitrostat 0.4 Mg Subl (Nitroglycerin) .Marland Kitchen.. 1 Tablet Under Tongue At Onset of Chest Pain; You May Repeat Every 5 Minutes For Up To 3 Doses. 10)  Lopressor Hct 100-25 Mg Tabs (Metoprolol-Hydrochlorothiazide) .Marland Kitchen.. 1 By Mouth Qam 11)  Lotrel 5-20 Mg Caps  (Amlodipine Besy-Benazepril Hcl) .Marland Kitchen.. 1 Cap Once Daily  Allergies: 1)  Vytorin  Past History:  Past Medical History: Last updated: 01/04/2009 Coronary artery disease STENT 2010 Hypertension Peripheral vascular disease carotid artery stenosis Hyperlipidemia glucose intolerance DJD right hip Low back pain/facet arthropathy Colonic polyps, hx of - hyperplastic 2004 glaucoma R scalp + R ophth H. Zoster 2009 R head postherpetic neuralgia 2009 RLL pneumonia and a small effusion 2009  Past Surgical History: Last updated: 12/27/2009 Coronary artery bypass graft - 2002 s/p left Carpal tunnel s/p left thumb tendon surgury s/p cyst x 2 off the back early 80's Tonsillectomy Colon due 2014 Dr Juanda Chance  Family History: Last updated: 07/17/2007 Family History Hypertension heart disease - paternal mother with breast cancer high cholesterol  Social History: Last updated: 07/17/2007 Retired - Forensic scientist Married Former Smoker Alcohol use-no 3 children  Risk Factors: Smoking Status: quit (07/01/2009)  Review of Systems       negative other history of present illness  Vital Signs:  Patient profile:   77 year old male Height:      68 inches Weight:      200.4 pounds BMI:     30.58 Pulse rate:   66 / minute Pulse rhythm:   irregular BP sitting:   136 / 72  (left arm) Cuff size:   large  Vitals Entered By: Danielle Rankin, CMA (March 06, 2010 10:15 AM)  Physical Exam  General:  obese.  no acute distress Head:  normocephalic and atraumatic Eyes:  PERRLA/EOM intact; conjunctiva and lids normal. Neck:  Neck supple, no JVD. No masses, thyromegaly or abnormal cervical nodes. Chest Wall:  no deformities or breast masses noted Lungs:  Clear bilaterally to auscultation and percussion. Heart:  PMI nondisplaced, regular rate and rhythm, soft S1-S2, no carotid bruits Abdomen:  soft, nondistended, good bowel sounds Msk:  Back normal, normal gait. Muscle strength and tone  normal. Pulses:  posterior tibials 2+ over 4+, dorsalis pedis difficult to palpate Extremities:  No clubbing or cyanosis. Neurologic:  Alert and oriented x 3. Skin:  Intact without lesions or rashes. Psych:  Normal affect.   Impression & Recommendations:  Problem # 1:  CORONARY ATHEROSCLEROSIS NATIVE CORONARY ARTERY (ICD-414.01) Assessment Unchanged  His updated medication list for this problem includes:    Aspirin 325 Mg Tabs (Aspirin) .Marland Kitchen... Take 1 tab by mouth every day    Plavix 75 Mg Tabs (Clopidogrel bisulfate) .Marland Kitchen... 1 qd    Nitrostat 0.4 Mg Subl (Nitroglycerin) .Marland Kitchen... 1 tablet under tongue at onset of chest pain; you may repeat every 5 minutes for up to 3 doses.    Lopressor Hct 100-25 Mg Tabs (Metoprolol-hydrochlorothiazide) .Marland Kitchen... 1 by mouth qam    Lotrel 5-20 Mg Caps (Amlodipine besy-benazepril hcl) .Marland Kitchen... 1 cap once daily  Problem # 2:  CAROTID ARTERY STENOSIS, WITHOUT INFARCTION (ICD-433.10) Assessment: Unchanged  His updated medication list for this problem includes:    Aspirin 325 Mg Tabs (Aspirin) .Marland Kitchen... Take 1 tab by mouth every day    Plavix 75 Mg Tabs (Clopidogrel bisulfate) .Marland Kitchen... 1 qd  Problem # 3:  HYPERLIPIDEMIA (ICD-272.4) Will add Zetia  at 10 mg per day goal LDL as low as possible. Followup labs in 6-8 weeks. His updated medication list for this problem includes:    Crestor 40 Mg Tabs (Rosuvastatin calcium) .Marland Kitchen... 1 by mouth qd    Zetia 10 Mg Tabs (Ezetimibe) .Marland Kitchen... Take 1 tablet daily  Problem # 4:  HYPERTENSION (ICD-401.9)  His updated medication list for this problem includes:    Aspirin 325 Mg Tabs (Aspirin) .Marland Kitchen... Take 1 tab by mouth every day    Lopressor Hct 100-25 Mg Tabs (Metoprolol-hydrochlorothiazide) .Marland Kitchen... 1 by mouth qam    Lotrel 5-20 Mg Caps (Amlodipine besy-benazepril hcl) .Marland Kitchen... 1 cap once daily  Clinical Reports Reviewed:  Cardiac Cath:  10/11/2009: Cardiac Cath Findings:  The EF was 65% with normal wall motion.      CONCLUSION:  Severe  native three-vessel coronary artery disease.  Patent   stent in the circumflex.  Two of the bypass grafts remain patent.  The   others are occluded as they had been previously.  He has got a well-   preserved ejection fraction.      PLAN:  The patient will continue to have aggressive medical management.   No percutaneous revascularization is indicated.               [name], MD, [facility]      11/18/2008: Cardiac Cath Findings:   CONCLUSION:   1. Coronary artery disease, status post coronary artery bypass graft       surgery in 2002.   2. Severe native vessel disease with 90% proximal stenosis in the left       anterior descending, 90% proximal stenosis in the circumflex artery       with 70% stenosis in the second  marginal branch, diffuse 90%       stenosis in the right coronary artery.   3. Patent radial graft to the posterior descending branch of the right       coronary artery, patent left internal mammary artery graft to the       left anterior descending, occluded vein graft to the diagonal       branch of the left anterior descending, and occluded vein graft to       the marginal branch of circumflex artery.   4. Good left ventricular function with estimated fraction of 60-70%.   5. Successful percutaneous coronary intervention of the lesion in the       proximal native circumflex artery using a Xience drug-eluting stent       with improvement of center narrowing from 90% to 0%.      DISPOSITION:  The patient returned to the [facility] room for further   observation.  The patient will remain on Plavix for at least a year.               [name], MD, [facility]        Cardiac Cath Findings:   Left ventriculogram:  The left ventriculogram was performed on the RAO   projection showed a good wall motion which was very vigorous.  Estimated   fraction was 60-70%.      Following stenting of the lesion in the proximal circumflex artery, the   stenosis improved from 90%  to 0%.      CONCLUSION:   1. Coronary artery disease, status post coronary artery bypass graft       surgery in 2002.   2. Severe native vessel disease with 90% proximal stenosis in the left       anterior descending, 90% proximal stenosis in the circumflex artery       with 70% stenosis in the second marginal branch, diffuse 90%       stenosis in the right coronary artery.   3. Patent radial graft to the posterior descending branch of the right       coronary artery, patent left internal mammary artery graft to the       left anterior descending, occluded vein graft to the diagonal       branch of the left anterior descending, and occluded vein graft to       the marginal branch of circumflex artery.   4. Good left ventricular function with estimated fraction of 60-70%.   5. Successful percutaneous coronary intervention of the lesion in the       proximal native circumflex artery using a Xience drug-eluting stent       with improvement of center narrowing from 90% to 0%.      DISPOSITION:  The patient returned to the [facility] room for further   observation.  The patient will remain on Plavix for at least a year.               [name], MD, [facility]   Electronically Signed      Nuclear Study:  09/28/2009:  Excerise capacity: Eugenie Birks  Blood Pressure response: Normal blood pressure response  Clinical symptoms: Chest tight  ECG impression: No significant ST segment change suggestive of ischemia  Overall impression Comments: The images suggest mild ischemia in the anterior and anterolateral walls. The quantitative analysis does not show this.  Willa Rough, MD, [facility]   03/05/2007:    Impression:  EF:  64%  Exercise Capacity: Adenosine study with no exercise. Blood Pressure Response: Clinical Symptoms: ECG Impression:Baseline: NSR with RBBB and diffuse ST-T wave abnormalities. No significant ST segment change with adenosine.  Overall Impression: Normal stress  nuclear study.  Arvilla Meres, MD   Patient Instructions: 1)  Your physician recommends that you schedule a follow-up appointment in: 6 months with Dr. Daleen Squibb 2)  Your physician has recommended you make the following change in your medication:  3)  Your physician recommends that you return for a FASTING lipid profile & liver with your primary care Cora Brierley 6-8 weeks Prescriptions: ZETIA 10 MG TABS (EZETIMIBE) take 1 tablet daily  #30 x 6   Entered by:   Lisabeth Devoid RN   Authorized by:   Gaylord Shih, MD, [facility]   Signed by:   Lisabeth Devoid RN on 03/06/2010   Method used:   Electronically to        [facility]  [phone]* (retail)       [address]       [place], [place]  [address]       Ph: [phone] or [phone]       Fax: [phone]   RxID:   [id number]

## 2010-05-18 NOTE — Assessment & Plan Note (Signed)
Summary: 3 mos w/labs/#/cd   Vital Signs:  Patient profile:   77 year old male Height:      68 inches (172.72 cm) Weight:      206 pounds (93.64 kg) BMI:     31.44 O2 Sat:      97 % on Room air Temp:     99.9 degrees F (37.72 degrees C) oral Pulse rate:   76 / minute BP sitting:   150 / 66  (left arm) Cuff size:   large  Vitals Entered By: Lucious Groves (October 03, 2009 1:57 PM)  O2 Flow:  Room air CC: 3 mo f/u./kb Is Patient Diabetic? No Pain Assessment Patient in pain? no        Primary Care Provider:  Georgina Quint Damarkus Balis MD  CC:  3 mo f/u./kb.  History of Present Illness: The patient presents for a follow up of hypertension, diabetes, hyperlipidemia BP nl at The patient presents for a wellness examination  Health and age related issues were discussed. Available screening tests and vaccinations were discussed as well. Healthy life style including good diet and execise was discussed.   Current Medications (verified): 1)  Crestor 40 Mg Tabs (Rosuvastatin Calcium) .Marland Kitchen.. 1 By Mouth Qd 2)  Vitamin D3 1000 Unit  Tabs (Cholecalciferol) .Marland Kitchen.. 1 Qd 3)  Aspirin 325 Mg Tabs (Aspirin) .... Take 1 Tab By Mouth Every Day 4)  Folic Acid 400 Mcg  Tabs (Folic Acid) .Marland Kitchen.. 1 By Mouth Qd 5)  Travatan 0.004 %  Soln (Travoprost) .... Xto Eye Q Hs 6)  Multivitamins   Tabs (Multiple Vitamin) .Marland Kitchen.. 1 Tab Once Daily 7)  Vitamin B-12 500 Mcg Tabs (Cyanocobalamin) .... Once Daily 8)  Plavix 75 Mg Tabs (Clopidogrel Bisulfate) .Marland Kitchen.. 1 Qd 9)  Nitrostat 0.4 Mg Subl (Nitroglycerin) .Marland Kitchen.. 1 Q5 Min As Needed Chest Pain 10)  Lopressor Hct 100-25 Mg Tabs (Metoprolol-Hydrochlorothiazide) .Marland Kitchen.. 1 By Mouth Qam 11)  Lotrel 5-20 Mg Caps (Amlodipine Besy-Benazepril Hcl) .Marland Kitchen.. 1 Cap Once Daily  Allergies (verified): 1)  Vytorin  Past History:  Past Medical History: Last updated: 01/04/2009 Coronary artery disease STENT 2010 Hypertension Peripheral vascular disease carotid artery  stenosis Hyperlipidemia glucose intolerance DJD right hip Low back pain/facet arthropathy Colonic polyps, hx of - hyperplastic 2004 glaucoma R scalp + R ophth H. Zoster 2009 R head postherpetic neuralgia 2009 RLL pneumonia and a small effusion 2009  Past Surgical History: Last updated: 07/17/2007 Coronary artery bypass graft - 2002 s/p left Carpal tunnel s/p left thumb tendon surgury s/p cyst x 2 off the back early 80's Tonsillectomy  Family History: Last updated: 07/17/2007 Family History Hypertension heart disease - paternal mother with breast cancer high cholesterol  Social History: Last updated: 07/17/2007 Retired - Forensic scientist Married Former Smoker Alcohol use-no 3 children  Review of Systems       The patient complains of dyspnea on exertion and difficulty walking.  The patient denies anorexia, fever, weight loss, weight gain, vision loss, decreased hearing, hoarseness, chest pain, syncope, peripheral edema, prolonged cough, headaches, hemoptysis, abdominal pain, melena, hematochezia, severe indigestion/heartburn, hematuria, incontinence, genital sores, muscle weakness, suspicious skin lesions, transient blindness, depression, unusual weight change, abnormal bleeding, enlarged lymph nodes, angioedema, and testicular masses.    Physical Exam  General:  alert, well-hydrated, appropriate dress, cooperative to examination, overweight-appearing, and uncomfortable-appearing.   Head:  Normocephalic and atraumatic without obvious abnormalities. No apparent alopecia or balding. Eyes:  vision grossly intact and pupils equal.   Ears:  External ear  exam shows no significant lesions or deformities.  Otoscopic examination reveals clear canals, tympanic membranes are intact bilaterally without bulging, retraction, inflammation or discharge. Hearing is grossly normal bilaterally. Nose:  External nasal examination shows no deformity or inflammation. Nasal mucosa are pink and moist  without lesions or exudates. Mouth:  Oral mucosa and oropharynx without lesions or exudates.  Teeth in good repair. Neck:  No deformities, masses, or tenderness noted. Chest Wall:  No deformities, masses, tenderness or gynecomastia noted. Lungs:  Normal respiratory effort, chest expands symmetrically. Lungs are clear to auscultation, no crackles or wheezes. Heart:  Normal rate and regular rhythm. S1 and S2 normal without gallop, murmur, click, rub or other extra sounds. Abdomen:  Bowel sounds positive,abdomen soft and non-tender without masses, organomegaly or hernias noted. Rectal:  per GI Genitalia:  WNL Prostate:  per GI Msk:  Walking w/cane Pulses:  R radial normal, R femoral normal, R carotid normal, L radial normal, L femoral normal, L carotid normal, R popliteal decreased, R posterior tibial decreased, R dorsalis pedis decreased, L popliteal decreased, and L posterior tibial decreased.   Extremities:  No clubbing, cyanosis, edema, or deformity noted with normal full range of motion of all joints.   Neurologic:  No cranial nerve deficits noted. Station and gait are normal. Plantar reflexes are down-going bilaterally. DTRs are symmetrical throughout. Sensory, motor and coordinative functions appear intact. Skin:  Intact without suspicious lesions or rashes Cervical Nodes:  No lymphadenopathy noted Inguinal Nodes:  No significant adenopathy Psych:  Cognition and judgment appear intact. Alert and cooperative with normal attention span and concentration. No apparent delusions, illusions, hallucinations   Impression & Recommendations:  Problem # 1:  PHYSICAL EXAMINATION (ICD-V70.0) Assessment New  Overall doing well, age appropriate education and counseling updated and referral for appropriate preventive services done unless declined, immunizations up to date or declined, diet counseling done if overweight, urged to quit smoking if smokes, most recent labs reviewed and current ordered if  appropriate, ecg reviewed or declined (interpretation per ECG scanned in the EMR if done); information regarding Medicare Preventation requirements given if appropriate.   Orders: First annual wellness visit with prevention plan  [phone]) TD Toxoids IM 7 YR + [phone]) Admin 1st Vaccine (29562) Admin 1st Vaccine [facility]) [phone]) Gastroenterology Referral (GI)  Problem # 2:  HYPERTENSION (ICD-401.9) Assessment: Unchanged BP nl at home His updated medication list for this problem includes:    Lopressor Hct 100-25 Mg Tabs (Metoprolol-hydrochlorothiazide) .Marland Kitchen... 1 by mouth qam    Lotrel 5-20 Mg Caps (Amlodipine besy-benazepril hcl) .Marland Kitchen... 1 cap once daily  Problem # 3:  LOW BACK PAIN (ICD-724.2) Assessment: Unchanged  His updated medication list for this problem includes:    Aspirin 325 Mg Tabs (Aspirin) .Marland Kitchen... Take 1 tab by mouth every day  Problem # 4:  ABNORMAL GLUCOSE NEC (ICD-790.29) Assessment: Comment Only  Labs Reviewed: Creat: 1.1 (09/28/2009)     Problem # 5:  CORONARY ATHEROSCLEROSIS NATIVE CORONARY ARTERY (ICD-414.01) Assessment: Unchanged  His updated medication list for this problem includes:    Aspirin 325 Mg Tabs (Aspirin) .Marland Kitchen... Take 1 tab by mouth every day    Plavix 75 Mg Tabs (Clopidogrel bisulfate) .Marland Kitchen... 1 qd    Nitrostat 0.4 Mg Subl (Nitroglycerin) .Marland Kitchen... 1 q5 min as needed chest pain    Lopressor Hct 100-25 Mg Tabs (Metoprolol-hydrochlorothiazide) .Marland Kitchen... 1 by mouth qam    Lotrel 5-20 Mg Caps (Amlodipine besy-benazepril hcl) .Marland Kitchen... 1 cap once daily  Orders: Prescription Created Electronically [phone])  Complete Medication List: 1)  Crestor 40 Mg Tabs (Rosuvastatin calcium) .Marland Kitchen.. 1 by mouth qd 2)  Vitamin D3 1000 Unit Tabs (Cholecalciferol) .Marland Kitchen.. 1 qd 3)  Aspirin 325 Mg Tabs (Aspirin) .... Take 1 tab by mouth every day 4)  Folic Acid 400 Mcg Tabs (Folic acid) .Marland Kitchen.. 1 by mouth qd 5)  Travatan 0.004 % Soln (Travoprost) .... Xto eye q hs 6)  Multivitamins Tabs (Multiple  vitamin) .Marland Kitchen.. 1 tab once daily 7)  Vitamin B-12 500 Mcg Tabs (Cyanocobalamin) .... Once daily 8)  Plavix 75 Mg Tabs (Clopidogrel bisulfate) .Marland Kitchen.. 1 qd 9)  Nitrostat 0.4 Mg Subl (Nitroglycerin) .Marland Kitchen.. 1 q5 min as needed chest pain 10)  Lopressor Hct 100-25 Mg Tabs (Metoprolol-hydrochlorothiazide) .Marland Kitchen.. 1 by mouth qam 11)  Lotrel 5-20 Mg Caps (Amlodipine besy-benazepril hcl) .Marland Kitchen.. 1 cap once daily  Patient Instructions: 1)  Please schedule a follow-up appointment in 4 months. 2)  BMP prior to visit, ICD-9: 3)  Hepatic Panel prior to visit, ICD-9:414.8 4)  Lipid Panel prior to visit, ICD-9: Prescriptions: CRESTOR 40 MG TABS (ROSUVASTATIN CALCIUM) 1 by mouth qd  #90 x 3   Entered and Authorized by:   Tresa Garter MD   Signed by:   Tresa Garter MD on 10/03/2009   Method used:   Print then Give to Patient   RxID:   [id number] CRESTOR 40 MG TABS (ROSUVASTATIN CALCIUM) 1 by mouth qd  #90 x 3   Entered and Authorized by:   Tresa Garter MD   Signed by:   Tresa Garter MD on 10/03/2009   Method used:   Faxed to ...       [facility] (mail-order)       [address]       [place], [place]  [address]  [place]       Ph:        Fax: [phone]   RxID:   [id number] CRESTOR 40 MG TABS (ROSUVASTATIN CALCIUM) 1 by mouth qd  #90 x 3   Entered and Authorized by:   Tresa Garter MD   Signed by:   Tresa Garter MD on 10/03/2009   Method used:   Electronically to        [facility]  [phone]* (retail)       [address]       [place], [place]  [address]       Ph: [phone] or [phone]       Fax: [phone]   RxID:   [id number]    Tetanus/Td Vaccine    Vaccine Type: Td    Site: right deltoid    Mfr: Sanofi Pasteur    Dose: 0.5 ml    Route: IM    Given by: Lucious Groves    Exp. Date: 05/12/2011    Lot #: [id number]    VIS given: 03/04/07 version given October 03, 2009.

## 2010-05-18 NOTE — Progress Notes (Signed)
Summary: returning call   Phone Note Call from Patient Call back at Home Phone [phone]   Caller: Patient Reason for Call: Talk to Nurse Summary of Call: returning call Initial call taken by: Migdalia Dk,  October 05, 2009 11:19 AM  Follow-up for Phone Call        PT AWARE OF MYOVIEW RESULTS CATH SCHEDULED FOR 10/11/09 AT 8:30 WITH DR [facility] Follow-up by: Scherrie Bateman, LPN,  October 05, 2009 1:35 PM  New Problems: PRE-OPERATIVE CARDIOVASCULAR EXAMINATION (ICD-V72.81)   New Problems: PRE-OPERATIVE CARDIOVASCULAR EXAMINATION (ICD-V72.81)

## 2010-05-18 NOTE — Assessment & Plan Note (Signed)
Summary: Cardiology Nuclear Study  Nuclear Med Background Indications for Stress Test: Evaluation for Ischemia, Graft Patency, Abnormal EKG   History: CABG, Echo, Heart Catheterization, Myocardial Perfusion Study, Stents  History Comments: '02 CABG x5 12/07 Echo: EF= 60% 11/08 MPS: EF=64%, NL 8/10 Heart Cath > Stent:CFX  Symptoms: Chest Pressure, Chest Tightness    Nuclear Pre-Procedure Cardiac Risk Factors: Carotid Disease, History of Smoking, Hypertension, Lipids, PVD, RBBB Caffeine/Decaff Intake: None NPO After: 10:00 PM Lungs: clear IV 0.9% NS with Angio Cath: 18g     IV Site: (R) AC IV Started by: Stanton Kidney EMT-P Chest Size (in) 46     Height (in): 68 Weight (lb): 207 BMI: 31.59 Tech Comments: lopressor was held this morning, per patient.  Nuclear Med Study 1 or 2 day study:  1 day     Stress Test Type:  Eugenie Birks Reading MD:  Willa Rough, MD     Referring MD:  T.Wall Resting Radionuclide:  Technetium 66m Tetrofosmin     Resting Radionuclide Dose:  10.4 mCi  Stress Radionuclide:  Technetium 67m Tetrofosmin     Stress Radionuclide Dose:  31 mCi   Stress Protocol   Lexiscan: 0.4 mg   Stress Test Technologist:  Frederick Peers EMT-P     Nuclear Technologist:  Domenic Polite CNMT  Rest Procedure  Myocardial perfusion imaging was performed at rest 45 minutes following the intravenous administration of Myoview Technetium 41m Tetrofosmin.  Stress Procedure  The patient received IV Lexiscan 0.4 mg over 15-seconds.  Myoview injected at 30-seconds.  There were no significant changes with infusion.  Quantitative spect images were obtained after a 45 minute delay.  QPS Raw Data Images:  Patient motion noted; appropriate software correction applied. Stress Images:  Mild decrease in activity in the aterior and anterolateral walls Rest Images:  Normal homogeneous uptake in all areas of the myocardium. Subtraction (SDS):  Mild reversibility in the anterior and anterolateral  walls Transient Ischemic Dilatation:  1  (Normal <1.22)  Lung/Heart Ratio:  .20  (Normal <0.45)  Quantitative Gated Spect Images QGS EDV:  83 ml QGS ESV:  29 ml QGS EF:  65 % QGS cine images:  Septal dyssynergy (may be post CABG)  Findings Abnormal      Overall Impression  Exercise Capacity: Lexiscan BP Response: Normal blood pressure response. Clinical Symptoms: chest tight ECG Impression: No significant ST segment change suggestive of ischemia. Overall Impression Comments: The images suggest mild ischemia in the anterior and anterolateral walls. The quantitative analysis does not show this.   Appended Document: Cardiology Nuclear Study needs OP JV cath. Have PA workup next week,  Appended Document: Cardiology Nuclear Study LMTCB./CY  Appended Document: Cardiology Nuclear Study PT AWARE CATH SCHEDULED./CY

## 2010-06-23 ENCOUNTER — Other Ambulatory Visit: Payer: Self-pay

## 2010-06-28 ENCOUNTER — Encounter (INDEPENDENT_AMBULATORY_CARE_PROVIDER_SITE_OTHER): Payer: Self-pay | Admitting: *Deleted

## 2010-06-28 ENCOUNTER — Other Ambulatory Visit: Payer: Self-pay | Admitting: Internal Medicine

## 2010-06-28 ENCOUNTER — Other Ambulatory Visit: Payer: Medicare Other

## 2010-06-28 DIAGNOSIS — R7309 Other abnormal glucose: Secondary | ICD-10-CM

## 2010-06-28 LAB — BASIC METABOLIC PANEL
CO2: 26 mEq/L (ref 19–32)
Calcium: 9.4 mg/dL (ref 8.4–10.5)
Creatinine, Ser: 1.2 mg/dL (ref 0.4–1.5)
Glucose, Bld: 99 mg/dL (ref 70–99)
Sodium: 139 mEq/L (ref 135–145)

## 2010-06-30 ENCOUNTER — Ambulatory Visit (INDEPENDENT_AMBULATORY_CARE_PROVIDER_SITE_OTHER): Payer: Medicare Other | Admitting: Internal Medicine

## 2010-06-30 ENCOUNTER — Encounter: Payer: Self-pay | Admitting: Internal Medicine

## 2010-06-30 DIAGNOSIS — I872 Venous insufficiency (chronic) (peripheral): Secondary | ICD-10-CM | POA: Insufficient documentation

## 2010-06-30 DIAGNOSIS — R635 Abnormal weight gain: Secondary | ICD-10-CM

## 2010-06-30 DIAGNOSIS — I2510 Atherosclerotic heart disease of native coronary artery without angina pectoris: Secondary | ICD-10-CM

## 2010-06-30 DIAGNOSIS — I10 Essential (primary) hypertension: Secondary | ICD-10-CM

## 2010-07-04 NOTE — Assessment & Plan Note (Signed)
Summary: 3 MO FU-LB   Vital Signs:  Patient profile:   77 year old male Height:      68 inches Weight:      207 pounds BMI:     31.59 Temp:     99.6 degrees F oral Pulse rate:   80 / minute Pulse rhythm:   regular Resp:     16 per minute BP sitting:   150 / 70  (left arm) Cuff size:   large  Vitals Entered By: Lanier Prude, CMA(AAMA) (June 30, 2010 1:19 PM) CC: 3 mo f/u c/o problem with discoloration on Rt LE Is Patient Diabetic? No   Primary Care Provider:  Tresa Garter MD  CC:  3 mo f/u c/o problem with discoloration on Rt LE.  History of Present Illness: The patient presents for a follow up of hypertension, diabetes, hyperlipidemia  C/o R leg discoloration  Current Medications (verified): 1)  Crestor 40 Mg Tabs (Rosuvastatin Calcium) .Marland Kitchen.. 1 By Mouth Qd 2)  Vitamin D3 1000 Unit  Tabs (Cholecalciferol) .Marland Kitchen.. 1 Qd 3)  Aspirin 325 Mg Tabs (Aspirin) .... Take 1 Tab By Mouth Every Day 4)  Folic Acid 400 Mcg  Tabs (Folic Acid) .Marland Kitchen.. 1 By Mouth Qd 5)  Travatan 0.004 %  Soln (Travoprost) .... Xto Eye Q Hs 6)  Multivitamins   Tabs (Multiple Vitamin) .Marland Kitchen.. 1 Tab Once Daily 7)  Vitamin B-12 500 Mcg Tabs (Cyanocobalamin) .... Once Daily 8)  Plavix 75 Mg Tabs (Clopidogrel Bisulfate) .Marland Kitchen.. 1 Qd 9)  Nitrostat 0.4 Mg Subl (Nitroglycerin) .Marland Kitchen.. 1 Tablet Under Tongue At Onset of Chest Pain; You May Repeat Every 5 Minutes For Up To 3 Doses. 10)  Lopressor Hct 100-25 Mg Tabs (Metoprolol-Hydrochlorothiazide) .Marland Kitchen.. 1 By Mouth Qam 11)  Zetia 10 Mg Tabs (Ezetimibe) .... Take 1 Tablet Daily 12)  Amlodipine Besylate 5 Mg Tabs (Amlodipine Besylate) .Marland Kitchen.. 1 By Mouth Qd  Allergies (verified): 1)  Vytorin 2)  Benazepril Hcl  Past History:  Past Medical History: Last updated: 01/04/2009 Coronary artery disease STENT 2010 Hypertension Peripheral vascular disease carotid artery stenosis Hyperlipidemia glucose intolerance DJD right hip Low back pain/facet arthropathy Colonic polyps, hx  of - hyperplastic 2004 glaucoma R scalp + R ophth H. Zoster 2009 R head postherpetic neuralgia 2009 RLL pneumonia and a small effusion 2009  Social History: Last updated: 07/17/2007 Retired - Forensic scientist Married Former Smoker Alcohol use-no 3 children  Review of Systems       The patient complains of weight gain.  The patient denies fever, chest pain, dyspnea on exertion, prolonged cough, and melena.    Physical Exam  General:  alert, well-hydrated, appropriate dress, cooperative to examination, overweight-appearing, and uncomfortable-appearing.   Eyes:  vision grossly intact and pupils equal.   Nose:  External nasal examination shows no deformity or inflammation. Nasal mucosa are pink and moist without lesions or exudates. Mouth:  Oral mucosa and oropharynx without lesions or exudates.  Teeth in good repair. Neck:  No deformities, masses, or tenderness noted. Lungs:  Normal respiratory effort, chest expands symmetrically. Lungs are clear to auscultation, no crackles or wheezes. Heart:  Normal rate and regular rhythm. S1 and S2 normal without gallop, murmur, click, rub or other extra sounds. Abdomen:  Bowel sounds positive,abdomen soft and non-tender without masses, organomegaly or hernias noted. Msk:  Walking w/cane Neurologic:  No cranial nerve deficits noted. Station and gait are normal. Plantar reflexes are down-going bilaterally. DTRs are symmetrical throughout. Sensory, motor and coordinative functions  appear intact. Skin:  R lat shin with a large brown area with suerficial varicous veins   Impression & Recommendations:  Problem # 1:  CORONARY ATHEROSCLEROSIS NATIVE CORONARY ARTERY (ICD-414.01) Assessment Unchanged  His updated medication list for this problem includes:    Lopressor Hct 100-25 Mg Tabs (Metoprolol-hydrochlorothiazide) .Marland Kitchen... 1 by mouth qam    Amlodipine Besylate 5 Mg Tabs (Amlodipine besylate) .Marland Kitchen... 1 by mouth qd    Aspirin 325 Mg Tabs (Aspirin) .Marland Kitchen...  Take 1 tab by mouth every day    Plavix 75 Mg Tabs (Clopidogrel bisulfate) .Marland Kitchen... 1 qd    Nitrostat 0.4 Mg Subl (Nitroglycerin) .Marland Kitchen... 1 tablet under tongue at onset of chest pain; you may repeat every 5 minutes for up to 3 doses.  Problem # 2:  HYPERLIPIDEMIA (ICD-272.4) Assessment: Unchanged  His updated medication list for this problem includes:    Crestor 40 Mg Tabs (Rosuvastatin calcium) .Marland Kitchen... 1 by mouth qd    Zetia 10 Mg Tabs (Ezetimibe) .Marland Kitchen... Take 1 tablet daily  Labs Reviewed: SGOT: 29 (12/23/2009)   SGPT: 23 (12/23/2009)  Prior 10 Yr Risk Heart Disease: N/A (11/17/2008)   HDL:41.90 (12/23/2009), 49.50 (09/28/2009)  LDL:127 (12/23/2009), 131 (03/25/2009)  Chol:196 (12/23/2009), 204 (09/28/2009)  Trig:135.0 (12/23/2009), 106.0 (09/28/2009)  Problem # 3:  VENOUS INSUFFICIENCY, CHRONIC (ICD-459.81) R>L Assessment: Deteriorated loose wt compr socks  Problem # 4:  HYPERTENSION (ICD-401.9) Assessment: Deteriorated The labs were reviewed with the patient. He declined adding another BB medicine His updated medication list for this problem includes:    Lopressor Hct 100-25 Mg Tabs (Metoprolol-hydrochlorothiazide) .Marland Kitchen... 1 by mouth qam    Amlodipine Besylate 5 Mg Tabs (Amlodipine besylate) .Marland Kitchen... 1 by mouth qd  BP today: 150/70 Prior BP: 146/78 (03/31/2010)  Prior 10 Yr Risk Heart Disease: N/A (11/17/2008)  Labs Reviewed: K+: 4.1 (06/28/2010) Creat: : 1.2 (06/28/2010)   Chol: 196 (12/23/2009)   HDL: 41.90 (12/23/2009)   LDL: 127 (12/23/2009)   TG: 135.0 (12/23/2009)  Problem # 5:  WEIGHT GAIN, ABNORMAL (ICD-783.1) Assessment: Deteriorated Diet discussed  Complete Medication List: 1)  Lopressor Hct 100-25 Mg Tabs (Metoprolol-hydrochlorothiazide) .Marland Kitchen.. 1 by mouth qam 2)  Amlodipine Besylate 5 Mg Tabs (Amlodipine besylate) .Marland Kitchen.. 1 by mouth qd 3)  Crestor 40 Mg Tabs (Rosuvastatin calcium) .Marland Kitchen.. 1 by mouth qd 4)  Vitamin D3 1000 Unit Tabs (Cholecalciferol) .Marland Kitchen.. 1 qd 5)  Aspirin 325  Mg Tabs (Aspirin) .... Take 1 tab by mouth every day 6)  Folic Acid 400 Mcg Tabs (Folic acid) .Marland Kitchen.. 1 by mouth qd 7)  Travatan 0.004 % Soln (Travoprost) .... Xto eye q hs 8)  Multivitamins Tabs (Multiple vitamin) .Marland Kitchen.. 1 tab once daily 9)  Vitamin B-12 500 Mcg Tabs (Cyanocobalamin) .... Once daily 10)  Plavix 75 Mg Tabs (Clopidogrel bisulfate) .Marland Kitchen.. 1 qd 11)  Nitrostat 0.4 Mg Subl (Nitroglycerin) .Marland Kitchen.. 1 tablet under tongue at onset of chest pain; you may repeat every 5 minutes for up to 3 doses. 12)  Zetia 10 Mg Tabs (Ezetimibe) .... Take 1 tablet daily  Patient Instructions: 1)  Please schedule a follow-up appointment in 3 months well w/labs. 2)  BMP prior to visit, ICD-9: 3)  TSH prior to visit, ICD-9:414.80  401.10  995.20 4)  CBC w/ Diff prior to visit, ICD-9:   Orders Added: 1)  Est. Patient Level IV [16606]

## 2010-07-22 LAB — PLATELET COUNT: Platelets: 187 10*3/uL (ref 150–400)

## 2010-07-22 LAB — BASIC METABOLIC PANEL
BUN: 7 mg/dL (ref 6–23)
CO2: 26 mEq/L (ref 19–32)
Chloride: 106 mEq/L (ref 96–112)
Creatinine, Ser: 1.02 mg/dL (ref 0.4–1.5)
Glucose, Bld: 94 mg/dL (ref 70–99)

## 2010-07-22 LAB — LIPID PANEL
HDL: 42 mg/dL (ref >39)
LDL Cholesterol: 128 mg/dL — ABNORMAL HIGH (ref 0–99)
Triglycerides: 91 mg/dL (ref <150)

## 2010-07-22 LAB — COMPREHENSIVE METABOLIC PANEL
ALT: 19 U/L (ref 0–53)
Albumin: 4 g/dL (ref 3.5–5.2)
Alkaline Phosphatase: 81 U/L (ref 39–117)
BUN: 11 mg/dL (ref 6–23)
Potassium: 4.5 mEq/L (ref 3.5–5.1)
Sodium: 138 mEq/L (ref 135–145)
Total Protein: 7 g/dL (ref 6.0–8.3)

## 2010-07-22 LAB — CBC
MCHC: 34.2 g/dL (ref 30.0–36.0)
MCV: 87.7 fL (ref 78.0–100.0)
Platelets: 188 10*3/uL (ref 150–400)
Platelets: 237 10*3/uL (ref 150–400)
RDW: 13.7 % (ref 11.5–15.5)
RDW: 13.8 % (ref 11.5–15.5)
WBC: 7.3 10*3/uL (ref 4.0–10.5)

## 2010-07-22 LAB — URINALYSIS, MICROSCOPIC ONLY
Glucose, UA: NEGATIVE mg/dL
Ketones, ur: NEGATIVE mg/dL
Leukocytes, UA: NEGATIVE
Specific Gravity, Urine: 1.016 (ref 1.005–1.030)
pH: 6.5 (ref 5.0–8.0)

## 2010-07-22 LAB — CARDIAC PANEL(CRET KIN+CKTOT+MB+TROPI)
CK, MB: 2.2 ng/mL (ref 0.3–4.0)
Relative Index: 0.9 (ref 0.0–2.5)
Relative Index: 1.1 (ref 0.0–2.5)
Relative Index: 1.4 (ref 0.0–2.5)
Total CK: 158 U/L (ref 7–232)
Troponin I: 0.04 ng/mL (ref 0.00–0.06)
Troponin I: 0.07 ng/mL — ABNORMAL HIGH (ref 0.00–0.06)

## 2010-07-22 LAB — MAGNESIUM: Magnesium: 2.4 mg/dL (ref 1.5–2.5)

## 2010-07-22 LAB — BRAIN NATRIURETIC PEPTIDE: Pro B Natriuretic peptide (BNP): 150 pg/mL — ABNORMAL HIGH (ref 0.0–100.0)

## 2010-08-11 ENCOUNTER — Encounter: Payer: Self-pay | Admitting: *Deleted

## 2010-08-11 DIAGNOSIS — J189 Pneumonia, unspecified organism: Secondary | ICD-10-CM | POA: Insufficient documentation

## 2010-08-14 ENCOUNTER — Encounter: Payer: Self-pay | Admitting: Cardiology

## 2010-08-16 ENCOUNTER — Ambulatory Visit (INDEPENDENT_AMBULATORY_CARE_PROVIDER_SITE_OTHER): Payer: Medicare Other | Admitting: Cardiology

## 2010-08-16 ENCOUNTER — Encounter: Payer: Self-pay | Admitting: Cardiology

## 2010-08-16 DIAGNOSIS — I6529 Occlusion and stenosis of unspecified carotid artery: Secondary | ICD-10-CM

## 2010-08-16 DIAGNOSIS — I2510 Atherosclerotic heart disease of native coronary artery without angina pectoris: Secondary | ICD-10-CM

## 2010-08-16 NOTE — Progress Notes (Signed)
   Patient ID: IMRI LOR, male    DOB: [birth year], 77 y.o.   MRN: [id number]  HPI  Mr Pondexter returns for E and M of his CAD and Carotid disease. He is currently asymptomatic except for chronic DOE. He is taking a lot of vitamins for no apparent reason. He has been on folic acid since his diagnosis of CAD. He eats a well balanced diet.  EKG today shows a chronic RBBB with diffuse ST changes, stable.    Review of Systems  All other systems reviewed and are negative.      Physical Exam  Nursing note and vitals reviewed. Constitutional: He is oriented to person, place, and time. He appears well-developed and well-nourished.       obese  HENT:  Head: Normocephalic and atraumatic.  Eyes: EOM are normal. Pupils are equal, round, and reactive to light.  Neck: Normal range of motion. No JVD present. No tracheal deviation present. No thyromegaly present.       Soft right bruit  Cardiovascular: Normal rate, regular rhythm, normal heart sounds and intact distal pulses.  Exam reveals no gallop.   No murmur heard. Pulmonary/Chest: Effort normal and breath sounds normal.  Abdominal: Bowel sounds are normal.  Musculoskeletal: Normal range of motion. He exhibits no edema.  Neurological: He is alert and oriented to person, place, and time.  Skin: Skin is warm and dry.  Psychiatric: He has a normal mood and affect.

## 2010-08-16 NOTE — Patient Instructions (Signed)
Your physician recommends that you schedule a follow-up appointment in:  1 year with Dr. Daleen Squibb Your physician has recommended you make the following change in your medication: You may discontinue your vitamins Your physician has requested that you have a carotid duplex. This test is an ultrasound of the carotid arteries in your neck. It looks at blood flow through these arteries that supply the brain with blood. Allow one hour for this exam. There are no restrictions or special instructions.  IN December 2012

## 2010-08-16 NOTE — Assessment & Plan Note (Signed)
Asymptomatic, no change in treatment. Carotid dopplers in 12/12.

## 2010-08-16 NOTE — Assessment & Plan Note (Signed)
Stable, no change in treatment except stop all vitamins. No clinical indication for supplementation.

## 2010-08-29 NOTE — Assessment & Plan Note (Signed)
[facility]                            CARDIOLOGY OFFICE NOTE   DANDREA, WIDDOWSON                        MRN:          [id number]  DATE:02/19/2007                            DOB:          01-25-1933    Mr. Clingan comes in today for management of the following issues:  1. Coronary artery disease, status post coronary artery bypass      grafting in 2002.  He is due a stress Myoview.  The last one showed      an ejection fraction of 61% with no ischemia in December, 2006.  He      is having no angina at present.  He does have some dyspnea on      exertion.  2. Cerebrovascular disease.  His carotid's were stable last year. He      is asymptomatic.  He is due followup.  3. Peripheral vascular disease.  He is currently having no significant      claudication.  This is being treated medically.  4. Hypertension, which has been actually quite good to his surprise.  5. Hyperlipidemia, followed by Dr. Posey Rea.  His lipids were not at      goal at last check.   CURRENT MEDICATIONS:  His current medications are:  1. Lopressor 50 mg p.o. b.i.d., which has been reduced.  2. Diovan/hydrochlorothiazide 320/25 mg daily.  3. Cardia XT 300 mg a day.  4. Aspirin 325 mg daily.  5. Vytorin 10/80 daily.   PHYSICAL EXAMINATION:  VITAL SIGNS:  His blood pressure today is 116/66.  Pulse is 56 and regular.  His EKG shows sinus bradycardia with a right  bundle branch block which is stable.  His weight is 215, down 5.  He is  down 12 in the last two years.  HEENT:  Normocephalic, atraumatic.  Pupils equal, round, reactive to  light and accommodation. Extraocular movements intact. Sclerae clear.  Facial symmetry normal.  NECK:  Carotid upstrokes equal bilaterally without bruits.  No JVD.  Thyroid is not enlarged.  LUNGS:  Clear.  HEART:  PMI is poorly appreciated.  He has normal S1 and S2 without  gallop.  ABDOMEN:  Protuberant with good bowel sounds.  No midline or  flank  bruits.  EXTREMITIES:  Reveal no cyanosis, clubbing.  There is trace edema.  Pulses were 1+/4+.  Feet were warm.  No sign of any ulcers or hot spots.  NEUROLOGICAL:  Exam is intact.   ASSESSMENT/PLAN:  Mr. Luecke is doing well.  I have arranged for him to  have an adenosine rest/stress Myoview as well as carotid Doppler's.  If  these studies are stable, we will see him back in a year.     Thomas C. Daleen Squibb, MD, [facility]  Electronically Signed   TCW/MedQ  DD: 02/19/2007  DT: 02/20/2007  Job #: [id number]

## 2010-08-29 NOTE — Discharge Summary (Signed)
NAME:  [name] [name], [name] [name] NO.:  [id number]   MEDICAL RECORD NO.:  [id number]          PATIENT TYPE:  INP   LOCATION:  5507                         FACILITY:  [facility]   PHYSICIAN:  Corwin Levins, MD      DATE OF BIRTH:  May 05, 1932   DATE OF ADMISSION:  01/26/2008  DATE OF DISCHARGE:  01/30/2008                               DISCHARGE SUMMARY   DISCHARGE DIAGNOSES:  1. Right lower lobe pneumonia.  2. Small right pleural effusion.  3. Coronary artery disease.  4. Status post coronary artery bypass grafting in 2002.  5. Hypertension.  6. History of peripheral vascular disease.  7. Hyperlipidemia.  8. History of shingles with postherpetic neuralgia.  9. Failed thoracentesis January 27, 2008, due to minimal volume of      right pleural effusion.  10.Glaucoma.  11.Vitamin D deficiency.   PROCEDURES:  Attempted thoracentesis on January 27, 2008, but failed.   CONSULTS:  None.   HISTORY AND PHYSICAL:  See the dictated date of admission.   HOSPITAL COURSE:  Mr. Giovanetti is a 77 year old African American male,  very nice patient of Dr. Posey Rea who presented with right chest wall  pain and shortness of breath on Friday, January 23, 2008.  The pain  seemed to start anteriorly, radiate to the back.  Over the next several  days, he experienced increasing dyspnea on exertion and shortness of  breath due to pain, it was presumed.  There were some upper respiratory  congestion.  He was seen by primary care physician around that time.  He  was admitted on January 26, 2008, with increasing symptoms, chest wall  discomfort.  There was resumed right lower lobe pneumonia with the small  amount of effusion found on chest x-ray.  There was also bibasilar  atelectasis and some mild vascular congestion without frank edema.  The  D-dimer was not elevated and there was no exacerbation of electrolyte  abnormalities or renal function.  There was some leukocytosis with fever  and he was felt  to have underlying pneumonia, admitted for IV  antibiotics.  He was ruled out for myocardial infarction.  Thoracentesis  attempted on January 27, 2008, failed due to relatively small amount of  effusion.  He did quite nicely with the antibiotics with defervescence,  leukocytosis, and fever.  He was changed to p.o. Avelox on third day of  admission.  He did have some volume depletion and hypotension on day  prior to discharge, which complicated the timing of discharge.  He was  given relatively aggressive IV fluids and by the morning of discharge,  he was no longer orthostatic.  Blood pressure was 118/50, he felt much  improved, symptomatically was able to ambulate about the room without  dizziness.  As he was overall eating, ambulatory without dizziness, it  was felt he had gained maximal benefit from this hospitalization and is  to be discharged home.   DISPOSITION:  Discharged to home in good condition.  There are no  activity or dietary restrictions except for heart-healthy diet as  before.  He  is to follow up with Dr. Posey Rea in 1-2 weeks.   DISCHARGE MEDICATIONS:  1. Metoprolol 100 mg p.o. daily.  2. Crestor 400 mg p.o. daily.  3. Cartia XT 300 mg p.o. daily.  4. Vitamin D 1000 units daily.  5. Aspirin 325 mg p.o. daily.  6. Folic acid 400 mg p.o. daily.  7. Travatan 0.004% at bedtime.  8. Multivitamin daily.  9. Diovan HCT 300/25 one p.o. daily.  10.Avelox 400 mg p.o. daily for 7 more days.   Of note is that we have asked him to hold on taking the Diovan HCT for 5  days post discharge to avoid the diuretic effect as he is regaining his  improved p.o. intake post pneumonia treatment.      Corwin Levins, MD  Electronically Signed     JWJ/MEDQ  D:  01/31/2008  T:  01/31/2008  Job:  [id number]   cc:   Georgina Quint. Plotnikov, MD

## 2010-08-29 NOTE — Discharge Summary (Signed)
NAME:  [name] [name], HOEGER NO.:  [id number]   MEDICAL RECORD NO.:  [id number]          PATIENT TYPE:  INP   LOCATION:  2914                         FACILITY:  [facility]   PHYSICIAN:  Sean A. Everardo All, MD    DATE OF BIRTH:  12-31-1932   DATE OF ADMISSION:  11/17/2008  DATE OF DISCHARGE:  11/19/2008                               DISCHARGE SUMMARY   REASON FOR ADMISSION:  Chest pain.   HISTORY OF PRESENT ILLNESS:  A 77 year old man admitted by Dr. Sanda Linger on November 17, 2008 with chest pain.  Please refer to his EMR  history and physical for details.   HOSPITAL COURSE:  The patient was admitted and his heart rate was  controlled with beta-blocker.  His chest pain was controlled with  nitroglycerin and he was placed on heparin.  Cardiology was consulted  and on November 18, 2008, he underwent coronary arteriography.  A  circumflex atherosclerotic lesion was stented.  He is on the DES and  PARIS studies.  He was placed on Plavix.   Regarding his hypertension and elevated heart rate, his outpatient  medications were adjusted to where his beta-blocker was increased and  his Diovan HCT and diltiazem were stopped.  This change was made on the  recommendation of Cardiology.   By November 19, 2008, the patient was alert, oriented, eating his usual  diet, ambulatory and asymptomatic, and was therefore discharged home in  fair condition.   MEDICATIONS:  1. Lopressor 75 mg twice a day.  2. Nitrostat 0.4 mg every 5 minutes as needed for chest pain.  3. Lopressor 75 mg a day.  4. He is advised to discontinue Diovan HCT, and diltiazem.  Other      medications are the same as on history and physical.   DIET:  Heart healthy.   ACTIVITY:  Increase slowly.   FOLLOWUP:  Dr. Tenny Craw on December 06, 2008 and also his previously scheduled  appointment with Dr. Posey Rea in September.   DISCHARGE DIAGNOSES:  1. Unstable angina/acute coronary syndrome.  2. Dyslipidemia.  3. Hypertension.  4. Other chronic medical problems as noted in the history and      physical.      Sean A. Everardo All, MD  Electronically Signed     SAE/MEDQ  D:  11/19/2008  T:  11/19/2008  Job:  [id number]

## 2010-08-29 NOTE — Cardiovascular Report (Signed)
NAME:  [name] [name], [name] [name] NO.:  [id number]   MEDICAL RECORD NO.:  [id number]          PATIENT TYPE:  INP   LOCATION:  2914                         FACILITY:  [facility]   PHYSICIAN:  Everardo Beals. Juanda Chance, MD, FACCDATE OF BIRTH:  Mar 05, 1933   DATE OF PROCEDURE:  11/18/2008  DATE OF DISCHARGE:                            CARDIAC CATHETERIZATION   CLINICAL HISTORY:  Mr. Millhouse is 77 years old and had bypass surgery in  2002.  He was admitted with chest pain and had positive enzymes  consistent with a non-ST-elevation MI.   PROCEDURE:  The procedure was performed by the femoral artery and  arterial sheath and 5-French preformed coronary catheters.  The LIMA  catheter was used for injection to the LIMA graft.  A 3-DRC was used for  injection of the native right coronary artery.  After completion of  diagnostic study, we made decision to proceed with intervention of the  native circumflex artery.   The patient was given antiemetics bolus and infusion.  He was given 600  mg of Plavix and 20 mg of Pepcid.  He had previously been given 4  chewable aspirin.  We used a CLS 3.5 guiding catheter with side holes.  We passed a [place] wire down the vessel without difficulty.  We  predilated the lesion with a 2.25 x 15 mm apex balloon performing 1  inflation to 10 atmospheres for 30 seconds.  We then attempted to pass a  2.5 x 15 mm Xience stent, but we were unable to pass the stent to the  lesion.  We then dilated the lesion with a 2.5 x 15 mm [place] apex  performing 1 inflation up to 12 atmospheres for 30 seconds.  We again  were unable to pass the stent to the lesion.  We then passed a buddy  wire using a P[place]ater wire.  With the help of the buddy wire, we were  able to advance the stent to the lesion.  We removed the buddy wire and  deployed the stent with 1 inflation up to 16 atmospheres for 30 seconds.  We postdilated with a 3.0 x 12 mm [place] apex performing 2 inflations up to  18  atmospheres for 30 seconds.  Final diagnostic studies were then  performed through the guiding catheter.   The right femoral artery was closed with Angio-Seal at the end of the  procedure.  The patient tolerated the procedure well and left Laboratory  in satisfactory condition.   RESULTS:  Left main coronary artery:  The left main coronary artery was  free of significant disease.   Left anterior descending artery.  The left anterior descending artery  was diffusely diseased proximally with 90% stenosis and there was  competing flow distally.   Circumflex artery:  The circumflex artery gave rise to a very large  marginal branch and an AV branch which terminated in the posterolateral  branch.  There was 90% stenosis in the proximal circumflex artery with  moderate calcification.  There was 70% stenosis in the proximal portion  of the second marginal branch.  There  was a very small first marginal  branch prior to the 90% obstruction in the circumflex artery.   Right coronary artery:  The right coronary artery was diffusely diseased  from its proximal portion to its distal portion.  The radial graft could  be seen to fill retrograde from the native injection.   The radial graft to posterior descending branch of the right coronary  artery was patent and functioned normally.  Distal vessel appeared to be  free of major obstruction.   The vein graft to diagonal branch and LAD was totally occluded at its  origin.   The vein graft to the marginal branch was totally occluded at its  origin.   The LIMA graft to the LAD was patent and functioned normally and there  was no significant distal disease in the LAD.   Left ventriculogram:  The left ventriculogram was performed on the RAO  projection showed a good wall motion which was very vigorous.  Estimated  fraction was 60-70%.   Following stenting of the lesion in the proximal circumflex artery, the  stenosis improved from 90% to  0%.   CONCLUSION:  1. Coronary artery disease, status post coronary artery bypass graft      surgery in 2002.  2. Severe native vessel disease with 90% proximal stenosis in the left      anterior descending, 90% proximal stenosis in the circumflex artery      with 70% stenosis in the second marginal branch, diffuse 90%      stenosis in the right coronary artery.  3. Patent radial graft to the posterior descending branch of the right      coronary artery, patent left internal mammary artery graft to the      left anterior descending, occluded vein graft to the diagonal      branch of the left anterior descending, and occluded vein graft to      the marginal branch of circumflex artery.  4. Good left ventricular function with estimated fraction of 60-70%.  5. Successful percutaneous coronary intervention of the lesion in the      proximal native circumflex artery using a Xience drug-eluting stent      with improvement of center narrowing from 90% to 0%.   DISPOSITION:  The patient returned to the [facility] room for further  observation.  The patient will remain on Plavix for at least a year.      [name], MD, [facility]  Electronically Signed     BRB/MEDQ  D:  11/18/2008  T:  11/19/2008  Job:  [id number]   cc:   Thomas C. Daleen Squibb, MD, [facility]  Sanda Linger, MD

## 2010-08-29 NOTE — Consult Note (Signed)
NAME:  [name], [name] NO.:  [id number]   MEDICAL RECORD NO.:  [id number]          PATIENT TYPE:  INP   LOCATION:  2914                         FACILITY:  [facility]   PHYSICIAN:  Bevelyn Buckles. Bensimhon, MDDATE OF BIRTH:  [birth year]   DATE OF CONSULTATION:  11/17/2008  DATE OF DISCHARGE:                                 CONSULTATION   PRIMARY CARDIOLOGIST:  Maisie Fus C. Daleen Squibb, MD, [facility]   PRIMARY CARE PHYSICIAN:  Sanda Linger, MD.   REASON FOR CONSULTATION:  Chest pain.   HISTORY OF PRESENT ILLNESS:  [name] [name] is a 77 year old African  American male with known history of coronary artery disease including  CABG in 2002 secondary to severe triple-vessel disease, last Myoview in  November 2008 was normal (no ischemia and normal EF).  The patient also  has history significant for hypertension, hyperlipidemia, PVD, and  carotid artery disease.  The patient presents with tachy palpitations,  and upper abdominal pain since 4 a.m. this morning.  The patient reports  experiencing 4/10 upper abdominal pain after having a bowel movement  early this a.m.  He also noticed tachy palpitations and mild diaphoresis  at that time.  The patient denies shortness of breath, nausea/vomiting,  fever/chills, orthopnea, recent changes to dyspnea on exertion, bleeding  or dark stools, or any other changes.  The patient presented to his  PCP's office and was subsequently sent to [facility] ED for further eval.  Here  his heart rate was in the low 100s, now down into the low 70s; blood  pressure elevated, now down to the 140s-150s over 60s-70s, but the  patient is on home meds plus 20 mcg per minute of nitroglycerin.  EKG  shows no significant changes from prior tracings, and the patient  remains asymptomatic since his earlier episode.   PAST MEDICAL HISTORY:  1. CAD.      a.     CABG secondary to severe triple-vessel disease in 2002.      b.     Myoview in November 2008 without evidence of ischemia, EF      64%.  2. Hypertension.  3. Hyperlipidemia.  4. PVD and carotid artery disease.  5. History of right lower lobe pneumonia and small effusion in 2009.  6. DJD hips, right greater than left.   SOCIAL HISTORY:  The patient is married with three children.  He is  retired from the [employer].  He is a former smoker without any current  EtOH, illicit drug use, or herbal medication use.  He has a regular diet  and is active, but does no regular exercise other than the occasional  walk.   FAMILY HISTORY:  Noncontributory secondary to the patient's advanced  age.   REVIEW OF SYSTEMS:  Please see HPI.  All other systems reviewed and were  negative.   CODE STATUS:  Full.   ALLERGIES:  1. EZETIMIBE.  2. SIMVASTATIN.   MEDICATIONS:  1. Metoprolol tartrate 100 mg p.o. daily.  2. Diovan 320 mg p.o. daily.  3. Hydrochlorothiazide 25 mg p.o. daily.  4. Crestor 40 mg  p.o. daily.  5. Cartia XT 300 mg daily.6.  6. Vitamin D3 1000 units daily.  7. Aspirin 325 mg p.o. daily.  8. Folic acid 400 mg daily.  9. Travatan 0.004% solution both eyes nightly.  10.Multivitamin daily.  11.Vitamin B12 500 mcg daily.   Inpatient, the patient started on nitroglycerin 20 mcg per minute and  Lovenox 90 mg subcu injection q.12 h.   PHYSICAL EXAMINATION:  VITAL SIGNS:  BP 150s over 60s, heart rate 70s to  low 100s, respiratory rate 19-24, temperature 9.9 degrees Fahrenheit, O2  saturation 98% on room air.  GENERAL:  The patient is alert and oriented x3 in no apparent distress.  He is able to move and speak fairly easily without any respiratory  distress.  HEENT:  His head is normocephalic and atraumatic.  Pupils are equal,  round, and reactive to light.  Extraocular muscles are intact.  Nares  are patent without discharge.  No erythema or exudates noted in the  oropharynx.  NECK:  Supple without lymphadenopathy.  The patient has bilateral  bruits.  HEART:  Rate is regular with audible S1 and S2, 2/6  systolic murmur  heard (AS).  Pulses 2+ and equal in both upper and lower extremities  bilaterally.  LUNGS:  Clear to auscultation bilaterally.  SKIN:  No rashes, lesions, or petechiae.  ABDOMEN:  Soft, nontender, and nondistended.  Normal abdominal bowel  sounds.  No rebound or guarding.  No hepatosplenomegaly.  Negative  Murphy sign.  EXTREMITIES:  No clubbing, cyanosis, or edema.  MUSCULOSKELETAL:  No joint deformity or effusions.  No spinal or CVA  tenderness.  NEUROLOGIC:  Cranial nerves II-XII are grossly intact.  Strength 5/5 in  all extremities and axial groups.  Normal sensation throughout and  normal cerebellar function.   RADIOLOGY:  Acute abdomen with chest revealed:  1. No acute lung disease.  2. No bowel obstruction or free air.  3. DJD of hips, right greater than left.   EKG:  Sinus tachycardia, rate 116, right bundle-branch block with left  axis deviation, T-wave inversion with ST depression in V4 through V6.  No significant Q-waves, no evidence of hypertrophy.  PR 168, QRS 128,  QTc 483.  No significant changes from prior tracing completed in 2006.   LABORATORY DATA:  WBC 9.2, HGB 15.4, HCT 45.7, PLT count 237.  Protime  13.3, INR 1.0.  Sodium 138, potassium 4.5, chloride 105, CO2 of 24, BUN  11, creatinine 1.11, glucose one 115.  Liver function tests within  normal limits.  Albumin 4.0, calcium 9.8, magnesium 2.4.  First set of  cardiac enzymes negative.  BNP 150.  Urinalysis within normal limits.   ASSESSMENT AND PLAN:  The patient was seen and examined by Dr. Gala Romney  who notes that his epigastric pain today is different than his prior  anginal equivalent; however, no explanation for his symptoms or his  tachycardia currently as he has a completely negative abdominal exam.  I  agree with Lovenox dosing.  I have discussed with the patient risks and  benefits and we will proceed with cardiac catheterization planning  (relook) for tomorrow.   Thank you for  the consult.      [name] [name], [facility]      Daniel R. Bensimhon, MD  Electronically Signed    MS/MEDQ  D:  11/17/2008  T:  11/18/2008  Job:  [id number]

## 2010-08-29 NOTE — Assessment & Plan Note (Signed)
[facility]                            CARDIOLOGY OFFICE NOTE   [name], [name]                        MRN:          [id number]  DATE:02/19/2008                            DOB:          [birth year]    [name] comes in today for followup.  He was just discharged from the  hospital with severe chest pain with a right lower lobe pneumonia being  diagnosed.  He has small right pleural effusion, but thoracentesis  failed.   He has a history coronary artery disease.  He has been stable from that  point of view.  His last stress Myoview was on March 05, 2007.  He  had EF of 64% with no ischemia.   MEDICATIONS:  1. Crestor 40 mg a day.  2. Metoprolol 100 mg daily.  3. Avelox 4 mg a day.  4. Diovan/HCTZ 320/25 mg a day.  5. Cartia 300 mg per day.  6. Aspirin 325 a day.   PHYSICAL EXAMINATION:  GENERAL:  He is in no acute distress.  VITAL SIGNS:  His blood pressure is 116/56.  Weight is 191.  HEENT:  Unchanged and normal.  NECK:  Carotids are full.  No bruits.  Thyroid is not enlarged.  Trachea  is midline.  LUNGS:  Clear to auscultation and percussion except for  some slight rales over the right middle lobe.  HEART:  Regular rate and rhythm.  No gallop.  ABDOMEN:  Soft.  Good bowel sounds.  EXTREMITIES:  There is no edema.  Pulses are present.  NEURO:  Intact.   Electrocardiogram shows normal sinus rhythm with a right bundle, which  is unchanged.   [name] [name] seems to be stable from our standpoint.  We will plan on  seeing him back in a year.  He will call us, if he has any problems in  the interim.  His last carotid Dopplers were on November 2008, which  were stable.  We will plan on repeating those this fall.     Thomas C. Daleen Squibb, MD, [facility]  Electronically Signed    TCW/MedQ  DD: 02/19/2008  DT: 02/20/2008  Job #: [id number]

## 2010-09-01 NOTE — Assessment & Plan Note (Signed)
[facility]                             PRIMARY CARE OFFICE NOTE   SHABAZZ, MCKEY                        MRN:          [id number]  DATE:12/07/2005                            DOB:          1932-05-15    CHIEF COMPLAINT:  Low back pain.   REFERRING PHYSICIAN:  Sonda Primes, M.D.   HISTORY OF PRESENT ILLNESS:  The patient is a 77 year old African American  male with past medical history of coronary artery disease, hypertension, and  peripheral vascular disease here for evaluation of low back pain.  The  patient has a history of intermittent low back pain that he has experienced  on and off for the last several years.  The patient describes location of  pain near his sacrum in the left side.  Described a sharp sensation that is  made worse with activities such as working in the yard and side bending.  The patient has no radiation of this discomfort.  Denies any nocturnal  discomfort and has not noticed any lower extremity weakness.  There are no  associated urinary symptoms.  He had seen an orthopedic physician in the  past who also noted significant osteoarthritis of his right hip.  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  He has noticed that he carries his  left shoulder higher than the right. This is the side that he carried his  mail bag.   PAST MEDICAL HISTORY:  1. Hypertension.  2. Coronary artery disease.  He is status post CABG in 2002.  Noted to      have normal left ventricular systolic function.  3. Peripheral vascular disease.  4. Hyperlipidemia.   CURRENT MEDICATIONS:  1. Lopressor 100 mg b.i.d.  2. Aspirin 325 mg once a day.  3. Folic acid 400 mcg once a day.  4. Cosopt eye drops b.i.d.  5. Diovan/hydrochlorothiazide 320/25 mg one a day.  6. Vytorin 80/10 mg one a day.  7. Clonidine 0.1 mg p.o. b.i.d.  8. Allegra 180 mg once a day.  9. Vitamin C 1000 mg every other day.  10.Cartia XT 300 mg one a day.   ALLERGIES TO  MEDICATIONS:  None known.   HABITS:  He is a former smoker.  No significant alcohol.   REVIEW OF SYSTEMS:  As noted above.  All other systems negative.   PHYSICAL EXAMINATION:  VITAL SIGNS:  Weight 220 pounds, temperature 97.4,  pulse 63, BP 146/71.  GENERAL:  The patient is a pleasant, well-developed, well-nourished,  somewhat overweight 77 year old African American male in no apparent  distress.  HEENT:  Normocephalic, atraumatic.  Pupils are equal and reactive to light  bilaterally.  Extraocular muscles were intact.  The patient was anicteric.  Conjunctivae within normal limits.  CHEST:  Normal inspiratory effort.  Clear to auscultation bilaterally.  No  rhonchi, rales or wheezes.  CARDIOVASCULAR:  Regular rate and rhythm.  No significant murmurs, rubs or  gallops appreciated.  ABDOMEN:  Somewhat protuberant, nontender.  Positive bowel sounds.  No  organomegaly.  MUSCULOSKELETAL:  The patient had some limited  motion but no significant  pain with lumbar flexion or extension.  Mild discomfort with side bending.  The patient had negative straight leg test.  He does appear to have limited  range of motion due to tight hamstrings bilaterally.  The patient's muscle  strength was 5/5 throughout.  His reflexes patellar were 0 to +1, 0 to +1  Achilles.  Gross __________ inspection noted that his left shoulder is  higher than his right.   X-RAY STUDIES:  The patient sent for x-ray of the lumbar spine with the  following results:  Disk narrowing at L1, L2, L3, L4 and L4-5 as well as L5-  S1.  Moderate bilateral facet arthropathy at multiple levels.  No acute  findings and rather marked degenerative disease of the right hip noted on AP  view.   IMPRESSION:  1. Chronic low back pain likely secondary to facet arthropathy.  2. Osteoarthritis of the right hip.  3. Hypertension.  4. Coronary artery disease.  5. Peripheral vascular disease.   RECOMMENDATIONS:  We discussed trying to avoid  long-term use of nonsteroidal  anti-inflammatories especially with the patient on multiple blood pressure  medications including ARB. We discussed several treatment modalities  including spinal manipulation and the patient is to return to our office in  approximately one week for trial of OMT.  We discussed various exercises to  help strengthen his lower back and abdominal muscles.  As the patient's pain  progresses, he will consider referral to orthopedist for possible steroid  injection to his facet joints.   He will likely need followup regarding his right hip osteoarthritis.  The  patient may be a candidate for hip replacement.                                  Barbette Hair. Artist Pais, DO  RDY/MedQ  DD:  12/10/2005  DT:  12/10/2005  Job #:  [id number]   cc:   Sonda Primes, MD

## 2010-09-01 NOTE — Discharge Summary (Signed)
[place]. [facility]  Patient:    [name] [name], [name] [name]                        MRN: [id number] Adm. Date:  [id number] Disc. Date: 05/11/00 Attending:  Tressie Stalker Dictator:   Loura Pardon, P.A. CC:         Sonda Primes, M.D. [facility]  Maisie Fus C. Daleen Squibb, M.D. [facility]   Discharge Summary  DATE OF BIRTH:  [birth year]  PRIMARY CARE GIVER: Sonda Primes, M.D.  CARDIOLOGIST:  Jesse Sans. Wall, M.D.  DISCHARGE DIAGNOSES: 1. Severe three-vessel atherosclerotic coronary artery disease with class IV    unstable angina. 2. Borderline elevated cardiac enzymes on this admission.  SECONDARY DIAGNOSES: 1. Hypertension. 2. Glaucoma. 3. Remote history of tobacco habituation. 4. History of exertional angina. 5. Hypercholesterolemia.  PROCEDURES: 1. On May 02, 2000, 2-D echocardiogram.  This study demonstrated left    ventricular normal size with thickened left ventricular wall.  Left    ventricular function normal.  No aortic stenosis.  Trivial mitral    regurgitation.  No pericardial effusion. 2. On May 03, 2000, left heart catheterization, left ventriculogram, and    coronary angiography.  This study demonstrated a left main with a distal    20% stenosis.  The left anterior descending coronary artery had an ostial    30% stenosis, a proximal 40% stenosis, and a midpoint 80% stenosis with a    distal 50% stenosis.  The diagonal had an 80% ostial stenosis.  The left    circumflex had an ostial 40% stenosis, a mid point 80% stenosis, and a    distal 30% stenosis.  The second obtuse marginal had a proximal    70% stenosis.  The right coronary artery had a proximal 80% stenosis with a    diffuse 70% stenosis leading to the midpoint.  The midpoint demonstrated a    focal 90% stenosis.  The study represented severe three-vessel    atherosclerotic coronary artery disease. 3. On May 03, 2000, carotid duplex ultrasound which was negative for    hemodynamically significant  internal carotid artery stenosis bilaterally. 4. On May 07, 2000, coronary artery bypass graft surgery x 5 by Salvatore Decent. Cornelius Moras, M.D., surgeon.  In this procedure, the left internal mammary    artery was connected in an end-to-side fashion to the left anterior    descending coronary artery, the left radial artery was fashioned from the    aorta to the posterior descending coronary artery, a sequential saphenous    vein graft was fashioned from the aorta to the first obtuse marginal and    then to the second obtuse marginal, and a reverse saphenous vein graft was    fashioned from the aorta to the diagonal.  DISCHARGE DISPOSITION:  [name] [name] was ready for discharge on postoperative day #4 after a relatively uneventful recovery from coronary artery bypass graft surgery on May 07, 2000.  He was relieved of all supplemental oxygen on postoperative day #1 after extubation on the day of surgery.  He was achieving 93% oxygen saturation on room air.  He maintained a sinus rhythm throughout the postoperative course.  He was started on oral Cardizem on postoperative day #1 to eliminate arterial graft spasm and to help control blood pressure.  His blood pressure was elevated throughout the postoperative period, necessitating adjustment of his medications upward to help control it in an acceptable range.  He will  go home on Cardizem, Lopressor, and ACE I inhibitor.  On postoperative day #2, he had elevated temperature spike at 1600 hours.  This was transient and not repeated, probably secondary to atelectasis.  By postoperative day #3, it was evident that he was improving greatly and his mental status had remained clear in the postoperative period.  He was ambulating without desaturation.  His incisions were healing nicely.  His oral intake was acceptable and he had full GI tract function.  DISCHARGE MEDICATIONS: 1. Ultram 50 mg one to two tablets p.o. q.4-6h. p.r.n. pain. 2.  Cardizem CD 240 mg daily. 3. Altace 5 mg at bedtime daily. 4. Lopressor 100 mg p.o. b.i.d. 5. Lipitor 40 mg at bedtime daily. 6. Enteric-coated aspirin 325 mg daily. 7. Xalatan 0.005% ophthalmic solution one drop in each eye at bedtime.  DISCHARGE ACTIVITY:  Ambulation as tolerated.  He is asked not to lift more than 10 pounds nor to drive for the next six weeks.  DISCHARGE DIET:  Low-sodium, low-cholesterol.  WOUND CARE:  He may shower daily, keeping his incisions clean and dry.  FOLLOW-UP:  He will see Maisie Fus C. Wall, M.D., in the office in two weeks.  A chest x-ray will be taken at that time.  He will see Salvatore Decent. Cornelius Moras, M.D., three weeks after discharge on Monday, June 03, 2000, at 10 a.m.  BRIEF HISTORY:  The patient is a 77 year old African-American male who apparently had been seen by the [facility], but had not been seen in the office three years prior to this visit on May 02, 2000.  He arrived with 45 minutes experience of chest pressure which started at rest, followed by some palpitations.  [name] [name] did not experience nausea, vomiting, radiation of the pain, syncope, or other symptoms.  The pain lasted for 45 minutes and then he went shopping.  He came back home and had another episode of chest pressure, which prompted him to see his primary care giver, Sonda Primes, M.D., in the office.  During the examination, he was chest pain free.  The plan would be to admit to [facility] with a diagnosis of possible unstable angina, rule out a myocardial infarction, maintain adequate blood pressure control, obtain a 2-D echocardiogram, and start appropriate medical therapy.  HOSPITAL COURSE:  [name] [name] was admitted to [facility] on May 02, 2000, after an episode of substernal chest pain which did not radiate.  Electrocardiogram studies showed abnormal segment changes in lead I  and V2-V6.  He was started  on IV heparin and Lopressor IV and given oral aspirin.  Cardiac enzymes were obtained which showed borderline elevations. He was started on a nitroglycerin drip for elevated blood pressure.  On May 02, 2000, he also underwent a 2-D echocardiogram.  The study results have been dictated above.  He has been in sinus rhythm preoperatively and postoperatively.  On May 03, 2000, he underwent left heart catheterization with findings of severe three-vessel atherosclerotic coronary artery disease. He was seen in consultation by Salvatore Decent. Cornelius Moras, M.D., of the [facility].  After extensive discussion with the patient and his family, Dr. Cornelius Moras recommended revascularization study.  [name] [name] also underwent a carotid duplex ultrasound on May 03, 2000.  The study was negative for hemodynamically significant internal carotid artery stenosis. [name] [name] was maintained chest pain-free on IV heparin until surgery, which was performed on May 07, 2000.  Five bypasses were placed  as previously described.  He tolerated the procedure well.  He came off of cardiopulmonary bypass without problem or pressor agents and was given no blood intraoperatively.  He received no blood in the postoperative period as well. He was extubated on the day of surgery and required no supplemental oxygen after extubation.  He was in a sinus rhythm in the postoperative period, which he has maintained throughout his postoperative convalescence.  On postoperative day #1, the creatinine was 1.5.  It had been 1.2 at baseline preoperatively.  The hemoglobin was 11 and hematocrit 33%.  He was started on Cardizem CD 240 mg for hypertension to prevent radial artery spasm.  On postoperative day #4, Lopressor was increased for continued elevations in his blood pressure from 50 mg to 100 mg twice a day.  He was also started on Lipitor 40 mg at bedtime and on an ACE inhibitor 5 mg daily.  He had  a temperature elevation in the afternoon of postoperative day #2 to 102.7 degrees.  It was transient and not repeated, probably secondary to atelectasis.  He has continued to work on incentive spirometry in the postoperative period.  On postoperative day #3, his mental status was clear. He was ambulating without desaturation.  He continued afebrile.  His incisions were healing well.  His creatinine was 1.6 and would be followed prior to his discharge on May 11, 2000. DD:  05/10/00 TD:  05/10/00 Job: [id number]

## 2010-09-01 NOTE — H&P (Signed)
[name]. [facility]  Patient:    [name] [name], [name] [name]                          MRN: [id number] Adm. Date:  05/02/00 Attending:  Sonda Primes, M.D. [facility]                         History and Physical  DATE OF BIRTH:  [birth year]  CHIEF COMPLAINT:  Chest pressure.  HISTORY OF PRESENT ILLNESS:  The patient is a 77 year old black male who has not been seen in the office for three years. He comes in this morning with 45 minutes worth of chest pressure that started at rest, followed by some palpitations. There was no nausea, vomiting, radiation, syncope and others. The pain lasted for 45 minutes. After that he went shopping to the fresh market. When he came back home he had an episode of chest pressure again, which prompted him to see me in the office. During the exam he was chest pain free.  PAST MEDICAL HISTORY: 1. Blood pressure elevated in the past. 2. Glaucoma. 3. Negative for coronary disease.  FAMILY HISTORY:  Positive for coronary artery disease.  SOCIAL HISTORY:  He quit smoking when he was 77 years old.  MEDICATIONS:  Eyedrops for glaucoma.  ALLERGIES:  None.  REVIEW OF SYSTEMS:  Chest pain with exercise, usually in the beginning and resolved with several minutes of exercise. This has been going on for weeks or months. No syncope. No nausea or vomiting. No indigestion. The rest is negative.  PHYSICAL EXAMINATION:  VITAL SIGNS:  Blood pressure 210/90. Weight 224 pounds. Temperature 99.8, pulse 100.  GENERAL:  He is in no acute distress.  HEENT:  Moist mucosa.  NECK:  Short, supple. No bruit.  LUNGS:  Clear to auscultation and percussion.  HEART:  S1 and S2. No gallop. Tachycardic. Grade 2/6 systolic murmur.  ABDOMEN:  Obese. Soft, nontender. No organomegaly. Normal bowel sounds.  LOWER EXTREMITIES:  Without edema.  NEUROLOGIC:  Cranial nerves II-XII normal. Deep tendon muscle strength normal. He is alert and cooperative.  EKG with  sinus tachycardia, pulse 118. Possible LVH with right bundle branch block. No old EKGs for comparison.  ASSESSMENT AND PLAN: 1. Chest pain, possible unstable angina. Rule out myocardial infarction. Admit    to telemetry with appropriate lab work. Start therapy with aspirin,    heparin, beta blocker, ACE inhibitors. Given aspirin. Contact cardiology    for consultation. He will be transferred to the hospital by ambulance. He    was given one aspirin. 2. Hypertension, malignant. Start on therapy IV/p.o. Lopressor and Vasotec. 3. Heart murmur. Obtain cardiac echo. DD:  05/02/00 TD:  05/02/00 Job: [id number]

## 2010-09-01 NOTE — Op Note (Signed)
[place]. [facility]  Patient:    [name] [name], [name] [name]                        MRN: [id number] Proc. Date: 05/07/00 Adm. Date:  [id number] Attending:  Tressie Stalker CC:         Jesse Sans. Daleen Squibb, M.D. [name]  Daisey Must, M.D. [facility]  Sonda Primes, M.D. [facility]   Operative Report  PREOPERATIVE DIAGNOSIS:  Severe three-vessel coronary artery disease with class 4 unstable angina.  POSTOPERATIVE DIAGNOSIS:  Severe three-vessel coronary artery disease with class 4 unstable angina.  OPERATION PERFORMED:  Median sternotomy for coronary artery bypass grafting  x 5 (left internal mammary artery to distal left anterior descending coronary artery, left radial artery to posterior descending coronary artery, saphenous vein graft to first diagonal branch, saphenous vein graft to first and second circumflex marginal branch sequentially).  SURGEON:  Salvatore Decent. Cornelius Moras, M.D.  ASSISTANT:  Areta Haber, P.A.  ANESTHESIA:  General.  INDICATIONS FOR PROCEDURE:  The patient is a 77 year old African-American male followed by Sonda Primes, M.D. and referred by Dr. Juanito Doom and Dr. Loraine Leriche Pulsipher for management of coronary artery disease.  The patient has a history of hypertension and newly diagnosed severe hypercholesterolemia.  He presents with symptoms of class unstable angina.  Cardiac catheterization demonstrates severe three-vessel coronary artery disease with normal left ventricular function.  A full consultation note has been dictated previously (dictation [id number]).  The patient and the family were counseled at length regarding the indications and potential benefits of coronary artery bypass grafting.  They understand the associated risks of surgery including but not limited to the risks of death, stroke, myocardial infarction, bleeding requiring blood transfusion, , arrhythmia, infection and recurrent coronary artery disease.  We also discussed alternative choices for  conduit and in particular, the potential use of left radial artery as conduit for bypass grafting.  They accept these risks and the patient desires to proceed with the surgery as planned.  Preoperative noninvasive vascular examination demonstrates intact palmar arch circulation in the left hand.  DESCRIPTION OF PROCEDURE:  The patient was brought to the operating room on the above-mentioned date and invasive hemodynamic monitoring was established by the anesthesia service under the care and direction of Dr. Ivin Booty.  The patient was placed in the supine position on the operating table.  Intravenous antibiotics were administered.  Prior to induction of anesthesia, pulse oximetry probe was placed on the patients left index finger.  The patients left radial pulse was manually compressed obliterating flow through this vessel and the pulse oximetry wave form was still monitored from the patients left index finger.  There was insignificant change in the pulse oximetry wave form which continues to track normally during compression of the radial artery.  General endotracheal anesthesia was induced uneventfully.  The patients chest, left upper extremity, abdomen, both groins, and both lower extremities were prepped and draped in a sterile manner.   The left radial artery was harvested to be utilized as a conduit for bypass grafting through a longitudinal incision along the volar aspect of the left forearm.  This dissection was performed using sharp dissection avoiding any use of electrocautery.  Care was taken to identify the superficial branch of the left radial nerve during this dissection and this was avoided.  All branches of the artery and its veins were divided between hemoclips.  The artery was mobilized in its entirety.  Simultaneously a median sternotomy  incision was performed and the left internal mammary artery was dissected from the chest wall and prepared for bypass grafting.  The  left internal mammary artery was notably good quality conduit for bypass grafting.  The patient was heparinized systemically.  The left radial artery was transected at the distal end just above the wrist and the distal end of the artery was doubly oversewn with suture ligatures. The artery was noted to be somewhat prone to arterial spasm as was the left internal mammary artery during dissection.  It was irrigated with copious papaverine solution.  The artery was flushed with  heparinized saline solution and inspected for hemostasis from all side branches.  Subsequently, the artery was transected at the proximal end just beyond the takeoff of the first interosseous branch.  The proximal end was doubly oversewn with suture ligatures.  The arterial graft was removed and placed in heparinized blood for storage.  The wound of the left forearm was irrigated with saline solution and inspected for hemostasis.  The incision was then closed in multiple layers using running absorbable suture.  Saphenous vein was obtained from the patients right lower leg through a series of longitudinal incisions.  The saphenous vein was notably good quality conduit for bypass grafting.  The pericardium was opened.  The ascending aorta was inspected and was notably free of any palpable plaques or calcifications.  The ascending aorta and the right atruim were cannulated for cardiopulmonary bypass.  Adequate heparinization was verified.  Cardiopulmonary bypass was begun and the surface of the heart was inspected. Distal sites were selected for coronary bypass grafting.  Portions of saphenous vein, the left radial artery, and the left internal mammary artery were all trimmed to appropriate length.  At this portion of the operation, the patients EKG suddenly changed with new onset of ST elevation across the precordial leads.  Subsequently, cardioplegia catheter was rapidly placed in  the ascending aorta and cooling  was begun.  The aortic crossclamp was applied.   After application of the aortic crossclamp, cardioplegia was delivered initially in antegrade fashion through the aortic root.  Iced saline slush was applied for topical hypothermia.  The initial cardioplegic arrest and myocardial cooling were noted to be rapid.  Repeat doses of cardioplegia were administered intermittently throughout the crossclamp portion of the operation both through the aortic root and down subsequently placed vein grafts to maintain septal temperature below 15 degrees centigrade.  The following distal coronary anastomoses were performed.  (1) The posterior descending coronary artery was grafted with the left radial artery using running 8-0 Prolene suture.  This coronary measured 1.5 mm at the site of his distal bypass and was of fair quality.  There was some nonobstructive plaque noted in both directions on the artery beyond the site of distal bypass.  (2) The circumflex marginal branch was grafted using a sequential saphenous vein graft.  There was a single large dominant circumflex marginal branch which gives off one distal circumflex branch and then subsequently bifurcates into a first and second circumflex marginal branch.  It is beyond the level of this bifurcation where the two vein graft distal anastomoses were placed.  The first circumflex marginal branch was noted to be 1.5 mm in diameter and was of good quality. It was constructed side-to-side with the saphenous vein using a side branch off the vein for the anastomosis.  The second circumflex marginal branch was grafted using the sequential saphenous vein in an end-to-side fashion.  This coronary measured 1.4  mm in diameter and was of good quality.  (4) The first diagonal branch off the left anterior descending coronary artery was grafted with a saphenous vein graft in an end-to-side fashion using running 7-0 Prolenesuture.  This coronary measured 1.3 mm in  diameter and was of good quality.  (5) The distal left anterior descending coronary artery was grafted with the left internal mammary artery using running 8-0 Prolene suture.  This coronary measured 1.4 mm at the site of distal bypass and was of fair quality. There was nonobstructed plaque noted both proximal and distal to the site of distal anastomosis.  The septal temperature was noted to rise rapidly and dramatically upon reperfusion of the left internal mammary artery.  The aortic crossclamp was removed after a total crossclamp time of 61 minutes.  All three proximal anastomoses were performed directly to the ascending aorta under separate partial occlusion clamp.  The proximal anastomosis of the left radial artery was constructed using a very short cuff of saphenous vein as an interposition to avoid mismatch between the relatively small size of the proximal end of the arterial conduit and the ascending aorta.  All proximal and distal anastomoses were inspected for hemostasis and appropriate graft orientation.  Epicardial pacing wires were affixed to the right ventricular outflow tract and to the right atrial appendage.  The patient was rewarmed to ____________ centigrade temperature.  The patient was weaned from cardiopulmonary bypass without difficulty.  The patients rhythm at separation from bypass was normal sinus rhythm.  No inotropic support was required.  Total cardiopulmonary bypass time for the operation was 126 minutes.  The venous and arterial cannulae were removed uneventfully.  Protamine was administered to reverse the anticoagulation.  The mediastinum and the left chest were irrigated with saline solution containing vancomycin.  Meticulous surgical hemostasis was ascertained.  The mediastinum and the left chest were drained with three chest tubes placed through separate stab incisions inferiorly.  The median sternotomy was closed in routine fashion.  The right lower  leg incision was closed in multiple layers in routine fashion.  All skin incisions were closed with subcuticular skin closures.  The patient tolerated the procedure well and was then transported to the surgical intensive care unit in stable condition.  There were no intraoperative complications.  All sponge, needle and instrument counts were verified correct at the completion of the operation.  No autologous blood products were administered. DD:  05/07/00 TD:  05/07/00 Job: [id number]

## 2010-09-01 NOTE — Assessment & Plan Note (Signed)
[name]                              CARDIOLOGY OFFICE NOTE   [name] [name], [name] [name]                        MRN:          [id number]  DATE:03/11/2006                            DOB:          [birth year]    [name] [name] comes today for further management of the following issues:  1. Coronary artery disease status post coronary bypass grafting in 2002.      He had a negative stress Myoview March 27, 2005, EF of 61% with no      ischemia.  2. Cerebrovascular disease.  He had progressive disease in his carotids on      last study December, 2006.  He is due follow up.  He is asymptomatic.  3. Peripheral vascular disease.  He is currently having no claudication.      This is being treated medically.  4. Hypertension.  5. Hyperlipidemia.   His lipids and other blood work are being followed by Dr. Posey Rea.  He  denies any angina or ischemic symptoms.   CURRENT MEDS:  1. Lopressor 100 b.i.d.  2. Aspirin 325 daily.  3. Folic acid 400 mcg a day.  4. Cosopt one drop both eyes b.i.d.  5. Cartia XT 300 mg a day.  6. B Complex one every other day.  7. Vitamin C.  8. Multivitamin.  9. Diovan HCT 320/25 daily.  10.Allegra 180 daily.  11.Vytorin 10/80 daily.   EXAM:  His blood pressure today is 112/52, his pulse is 48 and he is in  sinus brady, he has got a right bundle.  His heart rate last year was 59.  He has diffuse ST segment changes laterally which are unchanged.  His weight  is 220, down 7.  He is in no acute distress.  HEENT:  Normocephalic, atraumatic, PERRLA, extraocular movements intact.  Sclera clear.  Facial symmetry is normal.  Carotid upstrokes were equal  bilaterally with bruits.  There is no thyromegaly.  TRACHEA:  Midline.  LUNGS:  Clear.  STERNOTOMY SITE:  Intact.  He has a systolic murmur along the left sternal border with an S2 that  splits.  LUNGS:  Clear to auscultation.  UPPER EXTREMITIES:  Show a radial graft site from the  left upper extremity.  He has no edema or abnormalities.  ABDOMINAL EXAM:  Improved with good bowel sounds, no midline bruits, no  organomegaly.  EXTREMITIES:  No edema today.  His pulses were nonpalpable.  His feet are  warm.  NEURO:  Intact.   ASSESSMENT AND PLAN:  [name] [name] seems to be doing well.  Blood pressure is  the best I have seen it.   Plan:  1. Annual carotid Dopplers in December.  2. Two-D echocardiogram to assess LV function in his aortic valve.  3. Follow up with me in a year.     Thomas C. Daleen Squibb, MD, [facility] [place]  Electronically Signed    TCW/MedQ  DD: 03/11/2006  DT: 03/11/2006  Job #: [id number]   cc:   Georgina Quint. Plotnikov, MD

## 2010-09-01 NOTE — Cardiovascular Report (Signed)
[place]. [facility]  Patient:    [name] [name], [name] [name]                        MRN: [id number] Proc. Date: 05/03/00 Adm. Date:  [id number] Attending:  Tresa Garter CC:         Jesse Sans. Wall, M.D. [facility]  Sonda Primes, M.D. [facility]  Cardiac Catheterization Laboratory   Cardiac Catheterization  PROCEDURES PERFORMED:  Left heart catheterization with coronary angiography, left ventriculography, and abdominal aortography.  INDICATIONS:  Mr. Gironda is a 77 year old male, admitted with unstable angina.  DESCRIPTION OF PROCEDURE:  A 6 French sheath was placed in the right femoral artery.  Standard Judkins 6 French catheters were utilized.  Contrast was Omnipaque.  There were no complications.  RESULTS:  HEMODYNAMICS:  Left ventricular pressure 108/14.  Aortic pressure 132/74. There was no aortic valve gradient.  LEFT VENTRICULOGRAM:  Wall motion is normal with a normal to hyperdynamic LV systolic function.  Ejection fraction calculated at 70%.  There is no mitral regurgitation.  ABDOMINAL AORTOGRAM:  Renal arteries are free of significant disease.  There is mild diffuse plaque in the infrarenal abdominal aorta.  There is moderate diffuse disease in both iliac arteries with approximately 30% narrowing in the right iliac and 50% narrowing in the left iliac.  CORONARY ARTERIOGRAPHY:  (Right dominant).  Coronaries are diffusely diseased.  Left main:  Left main has a distal 20% stenosis.  Left anterior descending:  The left anterior descending artery has a 30% stenosis at its origin and a 40% stenosis in the proximal vessel.  In the mid vessel there is an 80% stenosis.  There was a normal sized first diagonal which has an 80% stenosis, small second and third diagonals.  The distal LAD has a diffuse 50%.  Left circumflex:  The left circumflex has a 40% stenosis at its ostium and 80% stenosis in the mid vessel.  The distal vessel is relatively small and has  a 30% stenosis.  The circumflex gives rise to a small OM-1, a large branching OM-2 and a small to normal sized OM-3.  OM-2 has a 50% stenosis proximally, a 70% stenosis in the more lateral branch and a 30% stenosis in the more medial branch and a 40% stenosis in a distal branch.  Right coronary artery:  The right coronary artery has diffuse disease in the proximal and mid vessel with moderate tortuosity.  There is an 80% stenosis in the proximal vessel, a diffuse 70% stenosis extending from the proximal to mid vessel with a focal 90% stenosis in the mid vessel.  The distal right coronary artery gives rise to a normal sized posterior descending artery and small to normal sized first posterolateral branch and a small second and posterolateral branch.  The posterior descending artery has a 30% stenosis in the mid vessel.  IMPRESSIONS: 1. Normal left ventricular systolic function. 2. Severe three-vessel coronary artery disease.  PLAN:  The patient will be referred for coronary artery bypass grafting. DD:  05/03/00 TD:  05/04/00 Job: [id number]

## 2010-09-01 NOTE — Consult Note (Signed)
[place]. [facility]  Patient:    [name] [name], [name] [name]                        MRN: [id number] Proc. Date: 05/03/00 Adm. Date:  [id number] Attending:  Tresa Garter CC:         Daisey Must, M.D. [facility]  CVTS office  Sonda Primes, M.D. [facility]  Jesse Sans. Wall, M.D. [facility]   Consultation Report  REQUESTING PHYSICIAN:  Daisey Must, M.D.  PRIMARY CARDIOLOGIST:  Jesse Sans. Wall, M.D.  PRIMARY CARE PHYSICIAN:  Sonda Primes, M.D.  REASON FOR CONSULTATION:  Severe three vessel coronary artery disease with class 4 unstable angina.  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  This chest pain is described as a tightness across the left anterior chest radiating to the left shoulder.  It is associated with mild shortness of breath.  The patient was admitted through the emergency room where he also was noted to be hypertensive.  The initial cardiac enzymes were negative, although electrocardiogram demonstrated nonspecific ST and T-wave changes in the anterolateral leads suggestive of myocardial ischemia.  The patient underwent cardiac catheterization today demonstrating severe three vessel coronary artery disease with preserved left ventricular function.  The patient is referred for elective surgical revascularization.  REVIEW OF SYSTEMS:  [name] denies any previous history of severe episodes of chest pain occurring at rest.  He did have an episode of similar chest pain approximately 18 months ago with exertion.  He denies any persistent or similar symptoms until yesterday when this developed after he ate breakfast and persisted for more than 45 minutes.  He does note that he has had some chest pain in the past which  developed during exertion and then resolved. This has not been limiting.  The patient denies any resting shortness of breath, PND, orthopnea, or lower extremity edema.  He reports no recent problems with fatigue, dyspnea on exertion, palpitations, or syncope.  His appetite has been good and stable.  He denies any recent change in bowel function.  He reports no hematochezia, hematemesis, or melena.  He denies any transient neurologic symptoms and reports no recent changes in his eye sight, although he does have recent diagnosis of glaucoma.  The remainder of his review of systems is unrevealing.  PAST MEDICAL HISTORY:  Notable for history of hypertension.  The patient apparently has no previous history of diabetes, hypercholesterolemia, coronary artery disease.  He has glaucoma.  PAST SURGICAL HISTORY:  Notable for previous tonsillectomy in the remote past as well as more recent left carpal tunnel release.  The patient notes that he does have symptoms in the right hand and forearm which are similar to those developed in the left prior to development of full blown carpal tunnel syndrome.  SOCIAL HISTORY:  The patient has been retired for two years and lives alone. He has multiple family members who live nearby.  He is divorced.  He has a remote history of tobacco use, although he quit smoking more than 20 years ago.  He denies any history of heavy alcohol consumption.  FAMILY HISTORY:  Notable for a strong presence of coronary artery disease on the fathers side.  He has a brother who died of myocardial infarction at age  75.  MEDICATIONS:  Xalatan eye drops.  ALLERGIES:  Denies.  PHYSICAL EXAMINATION:  GENERAL:  Well-appearing African-American male who appears his stated age in no acute distress.  VITAL SIGNS:  He is normotensive by monitor and is in normal sinus rhythm.  HEENT:  Within normal limits.  NECK:  Supple.  No cervical or supraclavicular lymphadenopathy.  No  jugular venous distention.  CHEST:  Auscultation demonstrates clear and symmetrical breath sounds bilaterally.  No wheezes or rhonchi are noted.  CARDIOVASCULAR:  Regular rate and rhythm.  No murmurs, rubs, or gallops are identified.  ABDOMEN:  Soft, mildly obese, and nontender.  No masses are identified.  The liver edge is not enlarged.  EXTREMITIES:  Warm and well perfused.  There is no lower extremity edema. There is no venous insufficiency.  Distal pulses are palpable in all four extremities.  The palmar arch appears to be intact on the left forearm and hand as circulation appears to restore normally with individual compression of either the ulnar or radial pulse.  NEUROLOGIC:  Grossly nonfocal and symmetrical throughout.  RECTAL:  Deferred.  GENITOURINARY:  Deferred.  Remainder of physical examination is unrevealing.  LABORATORIES:  Cardiac catheterization films performed today are reviewed. These demonstrate severe three vessel coronary artery disease with normal left ventricular function.  Specifically, there is 80% stenosis of the mid left anterior descending coronary artery arising after takeoff of the first diagonal branch.  There is 70-80% ostial stenosis of the first diagonal branch.  This is a relatively small vessel.  There is 80-90% stenosis of the proximal left circumflex coronary artery arising before a very large circumflex marginal branch.  The circumflex marginal branch is a large vessel which gives rise to three fairly sizable subbranches.  There is some disease intermittently throughout the remainder of this system.  Right coronary artery is dominant and has several hemodynamically significant lesions in the proximal and mid segment.  There is 80% proximal and 90% stenosis of the mid  portion of this vessel.  Left ventricular function appears well preserved with normal ejection fraction.  No other abnormalities are identified.  IMPRESSION:  Severe three  vessel coronary artery disease with normal left ventricular function.  I believe that [name] [name] would best be treated with elective coronary artery bypass grafting.  PLAN:  We tentatively plan to proceed with surgery on Tuesday, January 22.  I have outlined at length the indications, risks, potential benefits of surgery with Mr. Gabler.  He accepts all the associated risks including, but not limited to, risk of death, stroke, myocardial infarction, bleeding requiring blood transfusion, arrhythmia, infection, and recurrent coronary artery disease.  We have discussed possible alternative conduits as well as the possibility of using left radial artery as conduit for bypass grafting.  The relative risks and benefits of this versus additional saphenous vein as conduit have been discussed.  Mr. Marengo desires to proceed.  All questions have been answered. DD:  05/03/00 TD:  05/04/00 Job: [id number]

## 2010-09-21 ENCOUNTER — Emergency Department (HOSPITAL_COMMUNITY): Payer: Medicare Other

## 2010-09-21 ENCOUNTER — Observation Stay (HOSPITAL_COMMUNITY)
Admission: EM | Admit: 2010-09-21 | Discharge: 2010-09-22 | Disposition: A | Discharge status: Home / Self Care | Payer: Medicare Other | Source: Ambulatory Visit | Attending: Cardiology | Admitting: Cardiology

## 2010-09-21 DIAGNOSIS — I679 Cerebrovascular disease, unspecified: Secondary | ICD-10-CM | POA: Insufficient documentation

## 2010-09-21 DIAGNOSIS — R079 Chest pain, unspecified: Principal | ICD-10-CM | POA: Insufficient documentation

## 2010-09-21 DIAGNOSIS — I4510 Unspecified right bundle-branch block: Secondary | ICD-10-CM | POA: Insufficient documentation

## 2010-09-21 DIAGNOSIS — I739 Peripheral vascular disease, unspecified: Secondary | ICD-10-CM | POA: Insufficient documentation

## 2010-09-21 DIAGNOSIS — I252 Old myocardial infarction: Secondary | ICD-10-CM | POA: Insufficient documentation

## 2010-09-21 DIAGNOSIS — I2510 Atherosclerotic heart disease of native coronary artery without angina pectoris: Secondary | ICD-10-CM | POA: Insufficient documentation

## 2010-09-21 DIAGNOSIS — I10 Essential (primary) hypertension: Secondary | ICD-10-CM | POA: Insufficient documentation

## 2010-09-21 DIAGNOSIS — I359 Nonrheumatic aortic valve disorder, unspecified: Secondary | ICD-10-CM | POA: Insufficient documentation

## 2010-09-21 DIAGNOSIS — Z951 Presence of aortocoronary bypass graft: Secondary | ICD-10-CM | POA: Insufficient documentation

## 2010-09-21 DIAGNOSIS — M1710 Unilateral primary osteoarthritis, unspecified knee: Secondary | ICD-10-CM | POA: Insufficient documentation

## 2010-09-21 DIAGNOSIS — E785 Hyperlipidemia, unspecified: Secondary | ICD-10-CM | POA: Insufficient documentation

## 2010-09-21 LAB — CK TOTAL AND CKMB (NOT AT ARMC)
Relative Index: 1.7 (ref 0.0–2.5)
Total CK: 224 U/L (ref 7–232)

## 2010-09-21 LAB — DIFFERENTIAL
Basophils Relative: 0 % (ref 0–1)
Eosinophils Absolute: 0.2 10*3/uL (ref 0.0–0.7)
Eosinophils Relative: 2 % (ref 0–5)
Neutrophils Relative %: 73 % (ref 43–77)

## 2010-09-21 LAB — COMPREHENSIVE METABOLIC PANEL
Albumin: 4.2 g/dL (ref 3.5–5.2)
Alkaline Phosphatase: 75 U/L (ref 39–117)
BUN: 13 mg/dL (ref 6–23)
Chloride: 103 mEq/L (ref 96–112)
Creatinine, Ser: 1.05 mg/dL (ref 0.4–1.5)
Glucose, Bld: 113 mg/dL — ABNORMAL HIGH (ref 70–99)
Total Bilirubin: 1.4 mg/dL — ABNORMAL HIGH (ref 0.3–1.2)
Total Protein: 7.5 g/dL (ref 6.0–8.3)

## 2010-09-21 LAB — TROPONIN I: Troponin I: <0.3 ng/mL (ref <0.30)

## 2010-09-21 LAB — CBC
Platelets: 232 10*3/uL (ref 150–400)
RBC: 5.48 MIL/uL (ref 4.22–5.81)
RDW: 13.3 % (ref 11.5–15.5)
WBC: 8.3 10*3/uL (ref 4.0–10.5)

## 2010-09-22 ENCOUNTER — Ambulatory Visit: Payer: Medicare Other | Admitting: Internal Medicine

## 2010-09-22 LAB — CARDIAC PANEL(CRET KIN+CKTOT+MB+TROPI)
Relative Index: 1.9 (ref 0.0–2.5)
Relative Index: 1.8 (ref 0.0–2.5)
Total CK: 154 U/L (ref 7–232)
Total CK: 175 U/L (ref 7–232)
Troponin I: <0.3 ng/mL (ref <0.30)

## 2010-09-25 ENCOUNTER — Ambulatory Visit (HOSPITAL_COMMUNITY): Payer: Medicare Other | Attending: Internal Medicine | Admitting: Radiology

## 2010-09-25 DIAGNOSIS — I25810 Atherosclerosis of coronary artery bypass graft(s) without angina pectoris: Secondary | ICD-10-CM

## 2010-09-25 DIAGNOSIS — I2510 Atherosclerotic heart disease of native coronary artery without angina pectoris: Secondary | ICD-10-CM

## 2010-09-25 DIAGNOSIS — R0609 Other forms of dyspnea: Secondary | ICD-10-CM

## 2010-09-25 DIAGNOSIS — I4510 Unspecified right bundle-branch block: Secondary | ICD-10-CM

## 2010-09-25 DIAGNOSIS — R079 Chest pain, unspecified: Secondary | ICD-10-CM | POA: Insufficient documentation

## 2010-09-25 DIAGNOSIS — R0989 Other specified symptoms and signs involving the circulatory and respiratory systems: Secondary | ICD-10-CM

## 2010-09-25 DIAGNOSIS — R0789 Other chest pain: Secondary | ICD-10-CM

## 2010-09-25 MED ORDER — TECHNETIUM TC 99M TETROFOSMIN IV KIT
33.0000 | PACK | Freq: Once | INTRAVENOUS | Status: AC | PRN
  Administered 2010-09-25: 33 via INTRAVENOUS

## 2010-09-25 MED ORDER — TECHNETIUM TC 99M TETROFOSMIN IV KIT
11.0000 | PACK | Freq: Once | INTRAVENOUS | Status: AC | PRN
  Administered 2010-09-25: 11 via INTRAVENOUS

## 2010-09-25 MED ORDER — REGADENOSON 0.4 MG/5ML IV SOLN
0.4000 mg | Freq: Once | INTRAVENOUS | Status: AC
  Administered 2010-09-25: 0.4 mg via INTRAVENOUS

## 2010-09-25 NOTE — Progress Notes (Signed)
[facility] SITE 3 NUCLEAR MED [address] [place] [address] [phone]  Cardiology Nuclear Med Study  [name] [name] is a 77 y.o. male [id number] 1932-12-10   Nuclear Med Background Indication for Stress Test:  Evaluation for Ischemia, Graft Patency, Stent Patency, PTCA Patency and Post Hospital 09/21/10 Chest tightness, (-) enzymes x 3 History:  '02 CABG x 5; 12/07 Echo: Ef=60%; '10 PTCA/Stent CFX; 6/11 MPS: (+) mild ischemia anterior and anterolateral, EF=65%> Cath: Patent stent, 3 V disease, EF=65% Cardiac Risk Factors: Carotid Disease, Claudication, Family History - CAD, History of Smoking, Hypertension, Lipids, NIDDM, PVD and RBBB  Symptoms:  Chest Pressure/ Chest Tightness (last date of chest discomfort  4 days ago), DOE, Light-Headedness and Rapid HR   Nuclear Pre-Procedure Caffeine/Decaff Intake:  None NPO After: 8:00am   Lungs:  Clear IV 0.9% NS with Angio Cath:  20g  IV Site: R Forearm  IV Started by:  Cathlyn Parsons, RN  Chest Size (in):  44 Cup Size: n/a  Height: 5\' 7"  (1.702 m)  Weight:  196 lb (88.905 kg)  BMI:  Body mass index is 30.70 kg/(m^2). Tech Comments:  Lopressor held x 28 hrs    Nuclear Med Study 1 or 2 day study: 1 day  Stress Test Type:  Treadmill/Lexiscan  Reading MD: Charlton Haws, MD  Order Authorizing Provider:  Valera Castle, MD  Resting Radionuclide: Technetium 48m Tetrofosmin  Resting Radionuclide Dose: 11 mCi   Stress Radionuclide:  Technetium 25m Tetrofosmin  Stress Radionuclide Dose: 33 mCi           Stress Protocol Rest HR: 78 Stress HR: 109  Rest BP: 140/63 Stress BP: 195/73  Exercise Time (min): 2:00 METS: N/A   Predicted Max HR: 145 bpm % Max HR: 75.17 bpm Rate Pressure Product: 21308   Dose of Adenosine (mg):  n/a Dose of Lexiscan: 0.4 mg  Dose of Atropine (mg): n/a Dose of Dobutamine: n/a mcg/kg/min (at max HR)  Stress Test Technologist: Irean Hong, RN  Nuclear Technologist:  Domenic Polite, CNMT       Rest Procedure:  Myocardial perfusion imaging was performed at rest 45 minutes following the intravenous administration of Technetium 29m Tetrofosmin. Rest ECG: NSR with RBBB with T wave changes, PVC  Stress Procedure:  The patient received IV Lexiscan 0.4 mg over 15-seconds with concurrent low level exercise. Technetium 25m Tetrofosmin was injected at 30-seconds while the patient continued walking.  There were no significant changes with Lexiscan.  Quantitative spect images were obtained after a 45-minute delay. Stress ECG: Baseline ECG with RBBB and lateral T wave inversions  QPS Raw Data Images:  Patient motion noted. Stress Images:  Qualitative ischemia in anterolateral wall Rest Images:  Normal homogeneous uptake in all areas of the myocardium. Subtraction (SDS):  Qualitative suggestion of mild anterolateral ischemia although QPS is 0 Transient Ischemic Dilatation (Normal <1.22): 1.08 Lung/Heart Ratio (Normal <0.45):  .22  Quantitative Gated Spect Images QGS EDV:  70 ml QGS ESV:  29 ml QGS cine images:  NL LV Function; NL Wall Motion QGS EF: 58%  Impression Exercise Capacity:  Lexiscan with low level exercise. BP Response:  Normal blood pressure response. Clinical Symptoms:  There is dyspnea. ECG Impression:  No significant ST segment change suggestive of ischemia. Comparison with Prior Nuclear Study: No images to compare  Overall Impression:  Low risk study. Qualtiative mild anterolateral ischemia no infarct.  EF and QPS normal with baseline RBBB and lateral T wave inversions  Jenkins Rouge

## 2010-09-26 ENCOUNTER — Encounter: Payer: Self-pay | Admitting: Internal Medicine

## 2010-09-26 ENCOUNTER — Ambulatory Visit (INDEPENDENT_AMBULATORY_CARE_PROVIDER_SITE_OTHER): Payer: Medicare Other | Admitting: Internal Medicine

## 2010-09-26 DIAGNOSIS — R079 Chest pain, unspecified: Secondary | ICD-10-CM

## 2010-09-26 DIAGNOSIS — E119 Type 2 diabetes mellitus without complications: Secondary | ICD-10-CM

## 2010-09-26 DIAGNOSIS — I2510 Atherosclerotic heart disease of native coronary artery without angina pectoris: Secondary | ICD-10-CM

## 2010-09-26 DIAGNOSIS — E785 Hyperlipidemia, unspecified: Secondary | ICD-10-CM

## 2010-09-26 NOTE — Assessment & Plan Note (Signed)
On Rx 

## 2010-09-26 NOTE — Assessment & Plan Note (Signed)
Doing well now 

## 2010-09-26 NOTE — Progress Notes (Signed)
  Subjective:    Patient ID: [name] [name], male    DOB: 1932-09-17, 77 y.o.   MRN: [id number]  HPI Post-hosp f/u The patient presents for a follow-up of  chronic hypertension, chronic dyslipidemia, type 2 diabetes controlled with medicines    Review of Systems  Constitutional: Negative for appetite change, fatigue and unexpected weight change.  HENT: Negative for nosebleeds, congestion, sore throat, sneezing, trouble swallowing and neck pain.   Eyes: Negative for itching and visual disturbance.  Respiratory: Positive for shortness of breath. Negative for cough.   Cardiovascular: Negative for chest pain, palpitations and leg swelling.  Gastrointestinal: Negative for nausea, diarrhea, blood in stool and abdominal distention.  Genitourinary: Negative for frequency and hematuria.  Musculoskeletal: Positive for back pain and gait problem. Negative for joint swelling.  Skin: Negative for rash.  Neurological: Negative for dizziness, tremors, speech difficulty and weakness.  Psychiatric/Behavioral: Negative for sleep disturbance, dysphoric mood and agitation. The patient is not nervous/anxious.    Wt Readings from Last 3 Encounters:  09/26/10 198 lb (89.812 kg)  09/25/10 196 lb (88.905 kg)  08/16/10 205 lb 12.8 oz (93.35 kg)       Objective:   Physical Exam  Constitutional: He is oriented to person, place, and time. He appears well-developed.  HENT:  Mouth/Throat: Oropharynx is clear and moist.  Eyes: Conjunctivae are normal. Pupils are equal, round, and reactive to light.  Neck: Normal range of motion. No JVD present. No thyromegaly present.  Cardiovascular: Normal rate, regular rhythm and intact distal pulses.  Exam reveals no gallop and no friction rub.   Murmur (1-2/6) heard. Pulmonary/Chest: Effort normal and breath sounds normal. No respiratory distress. He has no wheezes. He has no rales. He exhibits no tenderness.  Abdominal: Soft. Bowel sounds are normal. He exhibits no  distension and no mass. There is no tenderness. There is no rebound and no guarding.  Musculoskeletal: Normal range of motion. He exhibits no edema and no tenderness.  Lymphadenopathy:    He has no cervical adenopathy.  Neurological: He is alert and oriented to person, place, and time. He has normal reflexes. No cranial nerve deficit. He exhibits normal muscle tone. Coordination (limp) abnormal.  Skin: Skin is warm and dry. No rash noted.  Psychiatric: He has a normal mood and affect. His behavior is normal. Judgment and thought content normal.        Lab Results  Component Value Date   WBC 8.3 09/21/2010   HGB 16.5 09/21/2010   HCT 45.6 09/21/2010   PLT 232 09/21/2010   CHOL 196 12/23/2009   TRIG 135.0 12/23/2009   HDL 41.90 12/23/2009   LDLDIRECT 136.2 09/28/2009   ALT 32 09/21/2010   AST 29 09/21/2010   NA 137 09/21/2010   K 3.9 09/21/2010   CL 103 09/21/2010   CREATININE 1.05 09/21/2010   BUN 13 09/21/2010   CO2 24 09/21/2010   TSH 1.47 09/28/2009   PSA 0.36 09/28/2009   INR 1.1 ratio* 10/06/2009   HGBA1C 5.9 06/28/2010     Assessment & Plan:    [facility] records were reviewed

## 2010-09-26 NOTE — Progress Notes (Signed)
Copy routed to Dr. Daleen Squibb.[name] [name]

## 2010-09-28 ENCOUNTER — Encounter: Payer: Self-pay | Admitting: Internal Medicine

## 2010-09-28 NOTE — Discharge Summary (Addendum)
NAMEMarland Kitchen  [name], [name] NO.:  [id number]  MEDICAL RECORD NO.:  [id number]  LOCATION:  2021                         FACILITY:  [facility]  PHYSICIAN:  Jesse Sans. Rylann Munford, MD, FACCDATE OF BIRTH:  16-Jul-1932  DATE OF ADMISSION:  09/21/2010 DATE OF DISCHARGE:  09/22/2010                              DISCHARGE SUMMARY   DISCHARGE DIAGNOSES: 1. Chest pain.     a.     Negative cardiac enzymes x3.     b.     Outpatient Myoview on Monday, Aug 25, 2010.     c.     Initiated on Imdur 60 mg daily. 2. Coronary artery disease.     a.     History of coronary artery bypass graft secondary to severe      triple vessel disease in 2002.     b.     History of non-ST-elevation myocardial infarction and drug-      eluting stent placed to the proximal left circumflex in August      2010.     c.     Last catheterization in June 2011, medical therapy. 3. Hypertension. 4. Hyperlipidemia. 5. Peripheral vascular disease and carotid artery disease. 6. Degenerative joint disease of the hip, right greater than left. 7. Right bundle-branch block. 8. Aortic valve sclerosis by last echocardiogram 2007. 9. History of right lower lobe pneumonia and small effusions in 2009.  HOSPITAL COURSE:  [name] [name] is a 77 year old gentleman with a history of known coronary artery disease who presented to the office with complaints of 2 hours of chest tightness but did not recall similar symptoms.  The chest pain was worrisome quality, but unrelated to exertion.  EKG was markedly abnormal but without definite acute changes from prior.  He was admitted to the hospital for observation and rule out MI.  There was no objective evidence for ischemia and cardiac enzymes were negative x3.  Dr. Daleen Squibb initiated him on a trial of Imdur 60 mg p.o. daily for chest pain and hypertension.  We will plan for an outpatient Myoview the following week.  Dr. Daleen Squibb has seen and examined him today and feels he is stable for  discharge.  DISCHARGE LABORATORY DATA:  WBC 8.3, hemoglobin 16.5, hematocrit 45.6, platelet count 232.  Sodium 137, potassium 3.9, chloride 103, CO2 24, glucose 113, BUN 13, creatinine 1.05.  Cardiac enzymes negative x3. Total bilirubin 1.4, alk phos 75, AST 29, ALT 32.  STUDIES:  Chest x-ray on September 21, 2010, showed no acute abnormality.  DISCHARGE MEDICATIONS: 1. Imdur 60 mg daily. 2. Nitro sublingual 0.4 mg every 5 minutes as needed up to three doses     for chest pain. 3. Amlodipine 5 mg daily. 4. Aspirin 325 mg daily. 5. Plavix 75 mg daily. 6. Zetia 10 mg daily. 7. Lopressor HCT 100/25 mg daily. 8. Crestor 40 mg daily. 9. Travatan eye drops, 1 drop with eyes daily at bedtime. 10.Vitamin C 1 tablet b.i.d. p.r.n. 11.Vitamin D3 1000 units daily.  Please note, the patient's admission medications were written differently than the patient endorsed taking.  I reviewed his medicine list specifically with the patient to clarify his home  medications which are noted above.  He did request a refill for his nitroglycerin sublingual as his old tablets have expired.  DISPOSITION:  [name] [name] will be discharged in stable condition to home and is to increase activity slowly.  He is to follow a heart-healthy diet.  He is to have to eat of drink after midnight the night before his stress test and will report for this on Monday, September 25, 2010, at 12:15 p.m.  He will follow up with Dr. Daleen Squibb subsequently on October 12, 2010, at 3 p.m.  DURATION OF DISCHARGE ENCOUNTER:  Greater than 30 minutes including physician and PA time.     Dayna Dunn, P.A.C.   ______________________________ Jesse Sans Daleen Squibb, MD, [facility]    DD/MEDQ  D:  09/22/2010  T:  09/23/2010  Job:  [id number]  Electronically Signed by Ronie Spies  on 09/28/2010 09:27:30 AM Electronically Signed by Valera Castle MD [facility] on 10/06/2010 10:58:19 AM

## 2010-09-28 NOTE — Assessment & Plan Note (Signed)
Cont Rx 

## 2010-10-04 ENCOUNTER — Other Ambulatory Visit: Payer: Self-pay | Admitting: Internal Medicine

## 2010-10-04 ENCOUNTER — Other Ambulatory Visit: Payer: Medicare Other

## 2010-10-04 DIAGNOSIS — Z0389 Encounter for observation for other suspected diseases and conditions ruled out: Secondary | ICD-10-CM

## 2010-10-04 DIAGNOSIS — Z1289 Encounter for screening for malignant neoplasm of other sites: Secondary | ICD-10-CM

## 2010-10-04 DIAGNOSIS — Z0000 Encounter for general adult medical examination without abnormal findings: Secondary | ICD-10-CM

## 2010-10-04 DIAGNOSIS — T887XXA Unspecified adverse effect of drug or medicament, initial encounter: Secondary | ICD-10-CM

## 2010-10-04 DIAGNOSIS — I10 Essential (primary) hypertension: Secondary | ICD-10-CM

## 2010-10-04 DIAGNOSIS — R972 Elevated prostate specific antigen [PSA]: Secondary | ICD-10-CM

## 2010-10-05 ENCOUNTER — Other Ambulatory Visit (HOSPITAL_COMMUNITY): Payer: Medicare Other | Admitting: Radiology

## 2010-10-06 ENCOUNTER — Ambulatory Visit: Payer: Medicare Other | Admitting: Internal Medicine

## 2010-10-12 ENCOUNTER — Encounter: Payer: Self-pay | Admitting: Cardiology

## 2010-10-12 ENCOUNTER — Ambulatory Visit (INDEPENDENT_AMBULATORY_CARE_PROVIDER_SITE_OTHER): Payer: Medicare Other | Admitting: Cardiology

## 2010-10-12 VITALS — BP 128/66 | HR 73 | Ht 67.0 in | Wt 199.4 lb

## 2010-10-12 DIAGNOSIS — I6529 Occlusion and stenosis of unspecified carotid artery: Secondary | ICD-10-CM

## 2010-10-12 DIAGNOSIS — I2510 Atherosclerotic heart disease of native coronary artery without angina pectoris: Secondary | ICD-10-CM

## 2010-10-12 NOTE — Progress Notes (Signed)
HPI [name] comes in for post hospital visit for chest pain. He ruled out for myocardial infarction. Stress Myoview was low risk with an ejection fraction of 58% with mild anterolateral ischemia but his QPS score was zero. He is remarkably improved on the isosorbide. Past Medical History  Diagnosis Date  . Coronary artery disease   . Hypertension   . Peripheral vascular disease   . Carotid artery disease   . Hyperlipidemia   . Glucose intolerance (impaired glucose tolerance)   . DJD (degenerative joint disease)     right hip  . Low back pain     facet arthropathy  . Hx of colonic polyps 2004    hyperplastic  . Glaucoma   . Herpes zoster 2009    r. scalp and opth  . Postherpetic neuralgia 2009  . Pneumonia 2009    rll with a small effusion    Past Surgical History  Procedure Date  . Coronary artery disease     stent 2010  . Carpal tunnel release     left  . Thumb tendon surgery     left  . Tonsillectomy   . Acne cyst removal 1970's    removed x2 off the back  . Coronary artery bypass graft 2002    Family History  Problem Relation Age of Onset  . Breast cancer Mother   . Cancer Mother     breast  . Heart disease Father     History   Social History  . Marital Status: Divorced    Spouse Name: N/A    Number of Children: 3  . Years of Education: N/A   Occupational History  .     Social History Main Topics  . Smoking status: Former Games developer  . Smokeless tobacco: Not on file   Comment: quit by the time he was 77 yrs old  . Alcohol Use: No  . Drug Use: No  . Sexually Active: Not Currently   Other Topics Concern  . Not on file   Social History Narrative  . No narrative on file    Allergies  Allergen Reactions  . Benazepril Hcl     REACTION: cough  . Ezetimibe-Simvastatin     REACTION: aches    Current Outpatient Prescriptions  Medication Sig Dispense Refill  . amLODipine (NORVASC) 5 MG tablet Take 5 mg by mouth daily.        Marland Kitchen aspirin 325 MG  tablet Take 325 mg by mouth daily.        . Cholecalciferol (VITAMIN D3) 1000 UNITS CAPS Take 1 capsule by mouth daily.        . clopidogrel (PLAVIX) 75 MG tablet Take 75 mg by mouth daily.        Marland Kitchen ezetimibe (ZETIA) 10 MG tablet Take 10 mg by mouth daily.        . isosorbide mononitrate (IMDUR) 60 MG 24 hr tablet Take 60 mg by mouth daily.        . metoprolol-hydrochlorothiazide (LOPRESSOR HCT) 100-25 MG per tablet Take 1 tablet by mouth daily.        . nitroGLYCERIN (NITROSTAT) 0.4 MG SL tablet Place 0.4 mg under the tongue every 5 (five) minutes as needed.        . rosuvastatin (CRESTOR) 40 MG tablet Take 40 mg by mouth daily.        . travoprost, benzalkonium, (TRAVATAN) 0.004 % ophthalmic solution 1 drop at bedtime.  ROS Negative other than HPI.   PE General Appearance: well developed, well nourished in no acute distress,obese HEENT: symmetrical face, PERRLA, good dentition  Neck: no JVD, thyromegaly, or adenopathy, trachea midline Chest: symmetric without deformity Cardiac: PMI non-displaced, RRR, normal S1, S2, no gallop or murmur Lung: clear to ausculation and percussion Vascular: right carotid bruit  Abdominal: nondistended, nontender, good bowel sounds, no HSM, no bruits Extremities: no cyanosis, clubbing or edema, no sign of DVT, no varicosities  Skin: normal color, no rashes Neuro: alert and oriented x 3, non-focal Pysch: normal affect Filed Vitals:   10/12/10 1521  BP: 128/66  Pulse: 73  Height: 5\' 7"  (1.702 m)  Weight: 199 lb 6.4 oz (90.447 kg)    EKG  Labs and Studies Reviewed.   Lab Results  Component Value Date   WBC 8.3 09/21/2010   HGB 16.5 09/21/2010   HCT 45.6 09/21/2010   MCV 83.2 09/21/2010   PLT 232 09/21/2010      Chemistry      Component Value Date/Time   NA 137 09/21/2010 1813   K 3.9 09/21/2010 1813   CL 103 09/21/2010 1813   CO2 24 09/21/2010 1813   BUN 13 09/21/2010 1813   CREATININE 1.05 09/21/2010 1813      Component Value Date/Time    CALCIUM 9.7 09/21/2010 1813   ALKPHOS 75 09/21/2010 1813   AST 29 09/21/2010 1813   ALT 32 09/21/2010 1813   BILITOT 1.4* 09/21/2010 1813       Lab Results  Component Value Date   CHOL 196 12/23/2009   CHOL 204* 09/28/2009   CHOL 194 03/25/2009   Lab Results  Component Value Date   HDL 41.90 12/23/2009   HDL 49.50 09/28/2009   HDL 16.10 03/25/2009   Lab Results  Component Value Date   LDLCALC 127* 12/23/2009   LDLCALC 131* 03/25/2009   LDLCALC  Value: 128        Total Cholesterol/HDL:CHD Risk Coronary Heart Disease Risk Table                     Men   Women  1/2 Average Risk   3.4   3.3  Average Risk       5.0   4.4  2 X Average Risk   9.6   7.1  3 X Average Risk  23.4   11.0        Use the calculated Patient Ratio above and the CHD Risk Table to determine the patient's CHD Risk.        ATP III CLASSIFICATION (LDL):  <100     mg/dL   Optimal  960-454  mg/dL   Near or Above                    Optimal  130-159  mg/dL   Borderline  098-119  mg/dL   High  >147     mg/dL   Very High* 11/15/9560   Lab Results  Component Value Date   TRIG 135.0 12/23/2009   TRIG 106.0 09/28/2009   TRIG 102.0 03/25/2009   Lab Results  Component Value Date   CHOLHDL 5 12/23/2009   CHOLHDL 4 09/28/2009   CHOLHDL 5 03/25/2009   Lab Results  Component Value Date   HGBA1C 5.9 06/28/2010   Lab Results  Component Value Date   ALT 32 09/21/2010   AST 29 09/21/2010   ALKPHOS 75 09/21/2010   BILITOT 1.4* 09/21/2010  Lab Results  Component Value Date   TSH 1.47 09/28/2009

## 2010-10-12 NOTE — Patient Instructions (Signed)
Your physician recommends that you schedule a follow-up appointment in: 6 months with Dr. Wall  

## 2010-10-12 NOTE — Assessment & Plan Note (Signed)
Repeat carotid Dopplers in December. Currently asymptomatic, continue medical therapy

## 2010-10-12 NOTE — Assessment & Plan Note (Signed)
He is much better on the isosorbide. Stress Myoview is low risk with a question of anterolateral ischemia. Left ventricular function is good.  Sublingual nitroglycerin has been renewed. Continue medical therapy.

## 2010-10-16 NOTE — Consult Note (Signed)
NAMEMarland Kitchen  [name] [name], [name] [name] NO.:  [id number]  MEDICAL RECORD NO.:  [id number]  LOCATION:  2021                         FACILITY:  [facility]  PHYSICIAN:  [name] Friends. Dietrich Pates, MD, FACCDATE OF BIRTH:  [birth year]  DATE OF CONSULTATION: DATE OF DISCHARGE:                                CONSULTATION   REFERRING PHYSICIAN:  Mauro [name], nurse practitioner.  PRIMARY CARE PHYSICIAN:  [name] Quint. Plotnikov, MD  PRIMARY CARDIOLOGIST:  [name] Sans. Wall, MD, FACC  HISTORY OF PRESENT ILLNESS:  A 77 year old gentleman with a long history of coronary disease and previous CABG surgery now presents with chest tightness.  [name] [name] cardiac history dates to 2002 when he underwent surgery for severe 3-vessel coronary disease.  He subsequently has done very well with generally modest or absent symptoms.  He was seen by Dr. Daleen Squibb 1 month ago at which time he was asymptomatic.  He continued to do well, doing exercises at home, walking and climbing stairs without difficulty until the day of admission when he noted the onset of mild-to- moderate anterior chest tightness at rest.  Symptoms persisted for approximately 1-2 hours and then faded.  There is no associated dyspnea, diaphoresis nor nausea.  He does not recall similar symptoms, certainly not in recent years.  He underwent a stress test approximately 2 years ago, the results of which is not immediately available to me.  Cardiac catheterization almost exactly 1 year ago showed severe 3-vessel disease with a patent stent in the circumflex and 2 patent bypass grafts, a LIMA to the LAD and a SVG to the RCA.  A diagonal was occluded as was the graft to that vessel.  LV systolic function was normal.  [name] [name] has had hypertension and hyperlipidemia that have been well treated.  He has history of peripheral vascular disease and cerebrovascular disease.  He has had palpitations in the past, but no significant arrhythmias.  PAST MEDICAL  HISTORY:  Otherwise notable for a right lower lobe pneumonia for which he was hospitalized in 2009.  He has DJD that has predominately involved his right hip and has improved since he lost a significant amount of weight.  SOCIAL HISTORY:  Married with 3 children and lives locally; retired from work with post office.  He previously smoked cigarettes, but not for the past 10 years.  He denies excessive alcohol use or any illicit drug use. He eats an unrestricted diet.  FAMILY HISTORY:  No prominent coronary or vascular disease.  REVIEW OF SYSTEMS:  The patient walks with a cane, but has relatively good exercise tolerance.  He has never suffered a CVA.  He experiences some stress, but has had no significant psychiatric history.  All other systems reviewed and are negative.  ALLERGIES:  EZETIMIBE and SIMVASTATIN are described.  MEDICATIONS:  Metoprolol, Diovan, HCTZ, rosuvastatin, diltiazem, and aspirin.  PHYSICAL EXAMINATION:  GENERAL:  Pleasant gentleman in no acute distress. VITAL SIGNS:  Temperature is 99.4, heart rate 77 and regular, respirations 16, blood pressure 145/85, O2 saturation 95% on room air. HEENT:  Anicteric sclerae; EOMs full; normal lids and conjunctivae; normal oral mucosa; torus palatinus. NECK:  No jugular venous distention; normal  carotid upstrokes without bruits. ENDOCRINE:  No thyromegaly. HEMATOPOIETIC:  No adenopathy. LUNGS:  Clear. CARDIAC:  Normal first and second heart sounds; grade 2-3/6 early peaking systolic ejection murmur at the cardiac base. ABDOMEN:  Soft and nontender; normal bowel sounds; no masses; no organomegaly. EXTREMITIES:  No edema; distal pulses intact. NEUROLOGIC:  Symmetric strength and tone; normal cranial nerves. PSYCHIATRIC:  Alert and oriented; normal affect.  EKG:  Normal sinus rhythm; left atrial abnormality; right bundle-branch block; possible LVH; ST-T wave abnormality including deep T-wave inversions consistent with  anterolateral ischemia or LVH.  Comparison to a previous tracing performed November 19, 2008 reveals a significant increase in ST-T wave abnormality.  CHEST X-RAY:  Right costophrenic angle scarring; otherwise clear lung fields; prior median sternotomy.  LABORATORY DATA:  A normal CBC, metabolic profile, and cardiac markers.  IMPRESSION:  [name] [name] presents with chest discomfort of worrisome quality but without associated symptoms and without relationship to exertion.  Symptoms resolved spontaneously.  He had nitroglycerin, but did not use it, and none was administered to him in the Urgent Care Center where he was seen.  With a recent catheterization showing fairly good revascularization, I would not incline to repeat that procedure unless there is evidence over the next 12-24 hours for reversible ischemia.  If not, I would be inclined to proceed with an outpatient stress test to evaluate for possible high risk exercise- induced related ischemia.  Attention will also be paid to any need for adjustment of his antihypertensive or lipid lowering therapy.  Examination is suggestive of mild aortic stenosis.  Review of an echocardiogram in 2007 indicates substantial aortic valve sclerosis.  At catheterization, there was evidence for a 5-mm peak-to-peak gradient across the aortic valve.  No further diagnostic testing is required at present.     [name] Friends. Dietrich Pates, MD, [facility]     RMR/MEDQ  D:  09/21/2010  T:  09/22/2010  Job:  [id number]  Electronically Signed by [place] Bing MD [facility] on 10/16/2010 05:18:00 PM

## 2010-12-20 ENCOUNTER — Telehealth: Payer: Self-pay | Admitting: Cardiology

## 2010-12-20 DIAGNOSIS — I6529 Occlusion and stenosis of unspecified carotid artery: Secondary | ICD-10-CM

## 2010-12-20 NOTE — Telephone Encounter (Signed)
Pt would like to be set up for carotid doppler in dec.

## 2010-12-20 NOTE — Telephone Encounter (Signed)
Pt calling to schedule December carotids and ov with Dr. Daleen Squibb Appts made for 04/06/11 Mylo Red RN

## 2010-12-21 ENCOUNTER — Other Ambulatory Visit (INDEPENDENT_AMBULATORY_CARE_PROVIDER_SITE_OTHER): Payer: Medicare Other

## 2010-12-21 DIAGNOSIS — E785 Hyperlipidemia, unspecified: Secondary | ICD-10-CM

## 2010-12-21 DIAGNOSIS — E119 Type 2 diabetes mellitus without complications: Secondary | ICD-10-CM

## 2010-12-21 DIAGNOSIS — R079 Chest pain, unspecified: Secondary | ICD-10-CM

## 2010-12-21 LAB — COMPREHENSIVE METABOLIC PANEL
ALT: 37 U/L (ref 0–53)
AST: 35 U/L (ref 0–37)
Albumin: 4.1 g/dL (ref 3.5–5.2)
BUN: 17 mg/dL (ref 6–23)
CO2: 25 mEq/L (ref 19–32)
Calcium: 9.2 mg/dL (ref 8.4–10.5)
Chloride: 108 mEq/L (ref 96–112)
GFR: 77.94 mL/min (ref >60.00)
Potassium: 4.3 mEq/L (ref 3.5–5.1)

## 2010-12-21 LAB — HEMOGLOBIN A1C: Hgb A1c MFr Bld: 5.8 % (ref 4.6–6.5)

## 2010-12-25 ENCOUNTER — Ambulatory Visit (INDEPENDENT_AMBULATORY_CARE_PROVIDER_SITE_OTHER): Payer: Medicare Other | Admitting: Internal Medicine

## 2010-12-25 ENCOUNTER — Encounter: Payer: Self-pay | Admitting: Internal Medicine

## 2010-12-25 DIAGNOSIS — E119 Type 2 diabetes mellitus without complications: Secondary | ICD-10-CM

## 2010-12-25 DIAGNOSIS — I10 Essential (primary) hypertension: Secondary | ICD-10-CM

## 2010-12-25 DIAGNOSIS — I2510 Atherosclerotic heart disease of native coronary artery without angina pectoris: Secondary | ICD-10-CM

## 2010-12-25 DIAGNOSIS — E785 Hyperlipidemia, unspecified: Secondary | ICD-10-CM

## 2010-12-25 MED ORDER — METOPROLOL-HYDROCHLOROTHIAZIDE 100-25 MG PO TABS: 1.0000 | ORAL_TABLET | Freq: Every day | ORAL | Status: DC

## 2010-12-25 MED ORDER — AMLODIPINE BESYLATE 5 MG PO TABS: 5.0000 mg | ORAL_TABLET | Freq: Every day | ORAL | Status: DC

## 2010-12-25 MED ORDER — EZETIMIBE 10 MG PO TABS: 10.0000 mg | ORAL_TABLET | Freq: Every day | ORAL | Status: DC

## 2010-12-25 MED ORDER — ISOSORBIDE MONONITRATE ER 60 MG PO TB24: 60.0000 mg | ORAL_TABLET | Freq: Every day | ORAL | Status: DC

## 2010-12-25 MED ORDER — ROSUVASTATIN CALCIUM 40 MG PO TABS: 40.0000 mg | ORAL_TABLET | Freq: Every day | ORAL | Status: DC

## 2010-12-25 MED ORDER — CLOPIDOGREL BISULFATE 75 MG PO TABS: 75.0000 mg | ORAL_TABLET | Freq: Every day | ORAL | Status: DC

## 2010-12-25 NOTE — Assessment & Plan Note (Signed)
Chronic. No angina Continue with current prescription therapy as reflected on the Med list.

## 2010-12-25 NOTE — Progress Notes (Signed)
  Subjective:    Patient ID: [name] [name], male    DOB: [birth year], 77 y.o.   MRN: [id number]  HPI  The patient presents for a follow-up of  chronic hypertension, chronic dyslipidemia, type 2 diabetes, CAD controlled with medicines    Review of Systems  Constitutional: Negative for appetite change, fatigue and unexpected weight change.  HENT: Negative for nosebleeds, congestion, sore throat, sneezing, trouble swallowing and neck pain.   Eyes: Negative for itching and visual disturbance.  Respiratory: Negative for cough.   Cardiovascular: Negative for chest pain, palpitations and leg swelling.  Gastrointestinal: Negative for nausea, diarrhea, blood in stool and abdominal distention.  Genitourinary: Negative for frequency and hematuria.  Musculoskeletal: Negative for back pain, joint swelling and gait problem.  Skin: Negative for rash.  Neurological: Negative for dizziness, tremors, speech difficulty and weakness.  Psychiatric/Behavioral: Negative for sleep disturbance, dysphoric mood and agitation. The patient is not nervous/anxious.    Wt Readings from Last 3 Encounters:  12/25/10 203 lb (92.08 kg)  10/12/10 199 lb 6.4 oz (90.447 kg)  09/26/10 198 lb (89.812 kg)       Objective:   Physical Exam  Constitutional: He is oriented to person, place, and time. He appears well-developed.       Obese  HENT:  Mouth/Throat: Oropharynx is clear and moist.  Eyes: Conjunctivae are normal. Pupils are equal, round, and reactive to light.  Neck: Normal range of motion. No JVD present. No thyromegaly present.  Cardiovascular: Normal rate, regular rhythm, normal heart sounds and intact distal pulses.  Exam reveals no gallop and no friction rub.   No murmur heard. Pulmonary/Chest: Effort normal and breath sounds normal. No respiratory distress. He has no wheezes. He has no rales. He exhibits no tenderness.  Abdominal: Soft. Bowel sounds are normal. He exhibits no distension and no mass. There is  no tenderness. There is no rebound and no guarding.  Musculoskeletal: Normal range of motion. He exhibits no edema and no tenderness.  Lymphadenopathy:    He has no cervical adenopathy.  Neurological: He is alert and oriented to person, place, and time. He has normal reflexes. No cranial nerve deficit. He exhibits normal muscle tone. Coordination normal.  Skin: Skin is warm and dry. No rash noted.  Psychiatric: He has a normal mood and affect. His behavior is normal. Judgment and thought content normal.     Lab Results  Component Value Date   WBC 8.3 09/21/2010   HGB 16.5 09/21/2010   HCT 45.6 09/21/2010   PLT 232 09/21/2010   CHOL 196 12/23/2009   TRIG 135.0 12/23/2009   HDL 41.90 12/23/2009   LDLDIRECT 136.2 09/28/2009   ALT 37 12/21/2010   AST 35 12/21/2010   NA 142 12/21/2010   K 4.3 12/21/2010   CL 108 12/21/2010   CREATININE 1.2 12/21/2010   BUN 17 12/21/2010   CO2 25 12/21/2010   TSH 1.47 09/28/2009   PSA 0.36 09/28/2009   INR 1.1 ratio* 10/06/2009   HGBA1C 5.8 12/21/2010        Assessment & Plan:   Loose wt

## 2010-12-25 NOTE — Assessment & Plan Note (Signed)
Continue with current prescription therapy as reflected on the Med list. Rx for BP monitor BP Readings from Last 3 Encounters:  12/25/10 160/72  10/12/10 128/66  09/26/10 120/60

## 2010-12-25 NOTE — Assessment & Plan Note (Signed)
Continue with current prescription therapy as reflected on the Med list.  

## 2011-01-15 LAB — URINALYSIS, ROUTINE W REFLEX MICROSCOPIC
Ketones, ur: NEGATIVE
Nitrite: NEGATIVE
Nitrite: NEGATIVE
Protein, ur: NEGATIVE
Specific Gravity, Urine: 1.022
pH: 6
pH: 6

## 2011-01-15 LAB — CULTURE, BLOOD (ROUTINE X 2): Culture: NO GROWTH

## 2011-01-15 LAB — BASIC METABOLIC PANEL
BUN: 16
Calcium: 9.7
GFR calc non Af Amer: 57 — ABNORMAL LOW
Glucose, Bld: 111 — ABNORMAL HIGH

## 2011-01-15 LAB — CBC
HCT: 40.3
MCHC: 33.8
MCV: 86.5
Platelets: 227
RBC: 4.66
RDW: 13.8
WBC: 12.6 — ABNORMAL HIGH
WBC: 12.8 — ABNORMAL HIGH

## 2011-01-15 LAB — CK TOTAL AND CKMB (NOT AT ARMC)
CK, MB: 1.2
Relative Index: INVALID
Total CK: 54

## 2011-01-15 LAB — DIFFERENTIAL
Basophils Absolute: 0
Eosinophils Relative: 0
Lymphocytes Relative: 9 — ABNORMAL LOW
Neutro Abs: 10.9 — ABNORMAL HIGH
Neutrophils Relative %: 85 — ABNORMAL HIGH

## 2011-01-15 LAB — CARDIAC PANEL(CRET KIN+CKTOT+MB+TROPI): Relative Index: 1.2

## 2011-01-15 LAB — LIPID PANEL
Cholesterol: 174
Triglycerides: 80

## 2011-01-15 LAB — TROPONIN I: Troponin I: 0.03

## 2011-03-30 ENCOUNTER — Other Ambulatory Visit (INDEPENDENT_AMBULATORY_CARE_PROVIDER_SITE_OTHER): Payer: Medicare Other

## 2011-03-30 DIAGNOSIS — E119 Type 2 diabetes mellitus without complications: Secondary | ICD-10-CM

## 2011-03-30 DIAGNOSIS — I2510 Atherosclerotic heart disease of native coronary artery without angina pectoris: Secondary | ICD-10-CM

## 2011-03-30 LAB — LIPID PANEL
HDL: 56 mg/dL (ref >39.00)
Total CHOL/HDL Ratio: 4

## 2011-03-30 LAB — COMPREHENSIVE METABOLIC PANEL
ALT: 33 U/L (ref 0–53)
AST: 40 U/L — ABNORMAL HIGH (ref 0–37)
Albumin: 4.2 g/dL (ref 3.5–5.2)
Calcium: 9.3 mg/dL (ref 8.4–10.5)
Chloride: 106 mEq/L (ref 96–112)
Potassium: 4.5 mEq/L (ref 3.5–5.1)
Sodium: 142 mEq/L (ref 135–145)
Total Protein: 7.2 g/dL (ref 6.0–8.3)

## 2011-03-30 LAB — HEMOGLOBIN A1C: Hgb A1c MFr Bld: 5.8 % (ref 4.6–6.5)

## 2011-04-02 ENCOUNTER — Encounter: Payer: Self-pay | Admitting: Internal Medicine

## 2011-04-02 ENCOUNTER — Ambulatory Visit (INDEPENDENT_AMBULATORY_CARE_PROVIDER_SITE_OTHER): Payer: Medicare Other | Admitting: Internal Medicine

## 2011-04-02 DIAGNOSIS — E785 Hyperlipidemia, unspecified: Secondary | ICD-10-CM

## 2011-04-02 DIAGNOSIS — I2510 Atherosclerotic heart disease of native coronary artery without angina pectoris: Secondary | ICD-10-CM

## 2011-04-02 DIAGNOSIS — I10 Essential (primary) hypertension: Secondary | ICD-10-CM

## 2011-04-02 DIAGNOSIS — R7309 Other abnormal glucose: Secondary | ICD-10-CM

## 2011-04-02 MED ORDER — LOSARTAN POTASSIUM 100 MG PO TABS: 100.0000 mg | ORAL_TABLET | Freq: Every day | ORAL | Status: DC

## 2011-04-02 NOTE — Patient Instructions (Addendum)
BP Readings from Last 3 Encounters:  04/02/11 170/80  12/25/10 160/72  10/12/10 128/66    Wt Readings from Last 3 Encounters:  04/02/11 200 lb (90.719 kg)  12/25/10 203 lb (92.08 kg)  10/12/10 199 lb 6.4 oz (90.447 kg)    Call if BP is not coming down

## 2011-04-02 NOTE — Assessment & Plan Note (Addendum)
Continue with current prescription therapy as reflected on the Med list see the change BP Readings from Last 3 Encounters:  04/02/11 170/80  12/25/10 160/72  10/12/10 128/66

## 2011-04-02 NOTE — Progress Notes (Signed)
  Subjective:    Patient ID: [name] [name], male    DOB: Aug 10, 1932, 77 y.o.   MRN: [id number]  HPI The patient presents for a follow-up of chronic dyslipidemia, CAD, elev glu controlled with medicines and HTN     Review of Systems  Constitutional: Negative for appetite change, fatigue and unexpected weight change.  HENT: Negative for nosebleeds, congestion, sore throat, sneezing, trouble swallowing and neck pain.   Eyes: Negative for itching and visual disturbance.  Respiratory: Negative for cough.   Cardiovascular: Negative for chest pain, palpitations and leg swelling.  Gastrointestinal: Negative for nausea, diarrhea, blood in stool and abdominal distention.  Genitourinary: Negative for frequency and hematuria.  Musculoskeletal: Negative for back pain, joint swelling and gait problem.  Skin: Negative for rash.  Neurological: Negative for dizziness, tremors, speech difficulty and weakness.  Psychiatric/Behavioral: Negative for sleep disturbance, dysphoric mood and agitation. The patient is not nervous/anxious.        Objective:   Physical Exam  Constitutional: He is oriented to person, place, and time. He appears well-developed.       Obese   HENT:  Mouth/Throat: Oropharynx is clear and moist.  Eyes: Conjunctivae are normal. Pupils are equal, round, and reactive to light.  Neck: Normal range of motion. No JVD present. No thyromegaly present.  Cardiovascular: Normal rate, regular rhythm, normal heart sounds and intact distal pulses.  Exam reveals no gallop and no friction rub.   No murmur heard. Pulmonary/Chest: Effort normal and breath sounds normal. No respiratory distress. He has no wheezes. He has no rales. He exhibits no tenderness.  Abdominal: Soft. Bowel sounds are normal. He exhibits no distension and no mass. There is no tenderness. There is no rebound and no guarding.  Musculoskeletal: Normal range of motion. He exhibits no edema and no tenderness.  Lymphadenopathy:    He has no cervical adenopathy.  Neurological: He is alert and oriented to person, place, and time. He has normal reflexes. No cranial nerve deficit. He exhibits normal muscle tone. Coordination normal.  Skin: Skin is warm and dry. No rash noted.  Psychiatric: He has a normal mood and affect. His behavior is normal. Judgment and thought content normal.   Lab Results  Component Value Date   WBC 8.3 09/21/2010   HGB 16.5 09/21/2010   HCT 45.6 09/21/2010   PLT 232 09/21/2010   GLUCOSE 99 03/30/2011   CHOL 200 03/30/2011   TRIG 118.0 03/30/2011   HDL 56.00 03/30/2011   LDLDIRECT 136.2 09/28/2009   LDLCALC 120* 03/30/2011   ALT 33 03/30/2011   AST 40* 03/30/2011   NA 142 03/30/2011   K 4.5 03/30/2011   CL 106 03/30/2011   CREATININE 1.1 03/30/2011   BUN 15 03/30/2011   CO2 29 03/30/2011   TSH 1.47 09/28/2009   PSA 0.36 09/28/2009   INR 1.1 ratio* 10/06/2009   HGBA1C 5.8 03/30/2011         Assessment & Plan:

## 2011-04-02 NOTE — Assessment & Plan Note (Signed)
2011: glu intolerance Monitoring labs

## 2011-04-02 NOTE — Assessment & Plan Note (Signed)
Continue with current prescription therapy as reflected on the Med list.  

## 2011-04-06 ENCOUNTER — Encounter: Payer: Self-pay | Admitting: Cardiology

## 2011-04-06 ENCOUNTER — Encounter (INDEPENDENT_AMBULATORY_CARE_PROVIDER_SITE_OTHER): Payer: Medicare Other | Admitting: *Deleted

## 2011-04-06 ENCOUNTER — Other Ambulatory Visit: Payer: Self-pay | Admitting: *Deleted

## 2011-04-06 ENCOUNTER — Ambulatory Visit (INDEPENDENT_AMBULATORY_CARE_PROVIDER_SITE_OTHER): Payer: Medicare Other | Admitting: Cardiology

## 2011-04-06 VITALS — BP 150/74 | HR 68 | Ht 66.0 in | Wt 200.0 lb

## 2011-04-06 DIAGNOSIS — I6529 Occlusion and stenosis of unspecified carotid artery: Secondary | ICD-10-CM

## 2011-04-06 DIAGNOSIS — I2510 Atherosclerotic heart disease of native coronary artery without angina pectoris: Secondary | ICD-10-CM

## 2011-04-06 DIAGNOSIS — E785 Hyperlipidemia, unspecified: Secondary | ICD-10-CM

## 2011-04-06 DIAGNOSIS — I10 Essential (primary) hypertension: Secondary | ICD-10-CM

## 2011-04-06 NOTE — Progress Notes (Signed)
HPI [name] comes in today for evaluation and management of his carotid artery disease. His Doppler studies showed no change with antegrade flow in both vertebrals. He is asymptomatic.  His blood pressures and a problem lately. Primary care just added losartan 100 mg a day which he started Monday. His blood pressures come down 20 points but is still running a bit high.  Denies any chest pain or angina. He has no edema or claudication.  Past Medical History  Diagnosis Date  . Coronary artery disease   . Hypertension   . Peripheral vascular disease   . Carotid artery disease   . Hyperlipidemia   . Glucose intolerance (impaired glucose tolerance)   . DJD (degenerative joint disease)     right hip  . Low back pain     facet arthropathy  . Hx of colonic polyps 2004    hyperplastic  . Glaucoma   . Herpes zoster 2009    r. scalp and opth  . Postherpetic neuralgia 2009  . Pneumonia 2009    rll with a small effusion    Current Outpatient Prescriptions  Medication Sig Dispense Refill  . amLODipine (NORVASC) 5 MG tablet Take 1 tablet (5 mg total) by mouth daily.  90 tablet  3  . aspirin 325 MG tablet Take 325 mg by mouth daily.        . Cholecalciferol (VITAMIN D3) 1000 UNITS CAPS Take 1 capsule by mouth daily.        . clopidogrel (PLAVIX) 75 MG tablet Take 1 tablet (75 mg total) by mouth daily.  90 tablet  3  . ezetimibe (ZETIA) 10 MG tablet Take 1 tablet (10 mg total) by mouth daily.  90 tablet  3  . isosorbide mononitrate (IMDUR) 60 MG 24 hr tablet Take 1 tablet (60 mg total) by mouth daily.  90 tablet  3  . losartan (COZAAR) 100 MG tablet Take 1 tablet (100 mg total) by mouth daily.  90 tablet  3  . metoprolol-hydrochlorothiazide (LOPRESSOR HCT) 100-25 MG per tablet Take 1 tablet by mouth daily.  90 tablet  3  . nitroGLYCERIN (NITROSTAT) 0.4 MG SL tablet Place 0.4 mg under the tongue every 5 (five) minutes as needed.        . rosuvastatin (CRESTOR) 40 MG tablet Take 1 tablet (40  mg total) by mouth daily.  90 tablet  3  . travoprost, benzalkonium, (TRAVATAN) 0.004 % ophthalmic solution 1 drop at bedtime.          Allergies  Allergen Reactions  . Benazepril Hcl     REACTION: cough  . Ezetimibe-Simvastatin     REACTION: aches    Family History  Problem Relation Age of Onset  . Breast cancer Mother   . Cancer Mother     breast  . Heart disease Father     History   Social History  . Marital Status: Divorced    Spouse Name: N/A    Number of Children: 3  . Years of Education: N/A   Occupational History  .     Social History Main Topics  . Smoking status: Former Games developer  . Smokeless tobacco: Not on file   Comment: quit by the time he was 77 yrs old  . Alcohol Use: No  . Drug Use: No  . Sexually Active: Not Currently   Other Topics Concern  . Not on file   Social History Narrative  . No narrative on file    ROS  ALL NEGATIVE EXCEPT THOSE NOTED IN HPI  PE  General Appearance: well developed, well nourished in no acute distress, obeseThe and the HEENT: symmetrical face, PERRLA, good dentition  Neck: no JVD, thyromegaly, or adenopathy, trachea midline Chest: symmetric without deformity Cardiac: PMI non-displaced, RRR, normal S1, S2, no gallop or murmur Lung: clear to ausculation and percussion Vascular: all pulses full without bruits  Abdominal: nondistended, nontender, good bowel sounds, no HSM, no bruits Extremities: no cyanosis, clubbing or edema, no sign of DVT, no varicosities  Skin: normal color, no rashes Neuro: alert and oriented x 3, non-focal Pysch: normal affect  EKG  BMET    Component Value Date/Time   NA 142 03/30/2011 1509   K 4.5 03/30/2011 1509   CL 106 03/30/2011 1509   CO2 29 03/30/2011 1509   GLUCOSE 99 03/30/2011 1509   BUN 15 03/30/2011 1509   CREATININE 1.1 03/30/2011 1509   CALCIUM 9.3 03/30/2011 1509   GFRNONAA >60 09/21/2010 1813   GFRAA  Value: >60        The eGFR has been calculated using the MDRD  equation. This calculation has not been validated in all clinical situations. eGFR's persistently <60 mL/min signify possible Chronic Kidney Disease. 09/21/2010 1813    Lipid Panel     Component Value Date/Time   CHOL 200 03/30/2011 1509   TRIG 118.0 03/30/2011 1509   HDL 56.00 03/30/2011 1509   CHOLHDL 4 03/30/2011 1509   VLDL 23.6 03/30/2011 1509   LDLCALC 120* 03/30/2011 1509    CBC    Component Value Date/Time   WBC 8.3 09/21/2010 1813   RBC 5.48 09/21/2010 1813   HGB 16.5 09/21/2010 1813   HCT 45.6 09/21/2010 1813   PLT 232 09/21/2010 1813   MCV 83.2 09/21/2010 1813   MCH 30.1 09/21/2010 1813   MCHC 36.2* 09/21/2010 1813   RDW 13.3 09/21/2010 1813   LYMPHSABS 1.5 09/21/2010 1813   MONOABS 0.5 09/21/2010 1813   EOSABS 0.2 09/21/2010 1813   BASOSABS 0.0 09/21/2010 1813

## 2011-04-06 NOTE — Assessment & Plan Note (Signed)
Stable. Results reviewed with patient. Repeat studies in one year.

## 2011-04-06 NOTE — Patient Instructions (Signed)
Your physician recommends that you schedule a follow-up appointment in: 1 year  

## 2011-04-06 NOTE — Assessment & Plan Note (Signed)
Stable by history. Continued secondary preventative therapy.

## 2011-04-06 NOTE — Assessment & Plan Note (Signed)
Improved but suboptimal. Continue current program with checking blood pressures that time. I've advised the patient the blood pressure is over 140/90 to call primary care for followup sooner than March.

## 2011-06-27 DIAGNOSIS — H4011X0 Primary open-angle glaucoma, stage unspecified: Secondary | ICD-10-CM | POA: Diagnosis not present

## 2011-06-27 DIAGNOSIS — H2510 Age-related nuclear cataract, unspecified eye: Secondary | ICD-10-CM | POA: Diagnosis not present

## 2011-07-02 ENCOUNTER — Other Ambulatory Visit (INDEPENDENT_AMBULATORY_CARE_PROVIDER_SITE_OTHER): Payer: Medicare Other

## 2011-07-02 DIAGNOSIS — T887XXA Unspecified adverse effect of drug or medicament, initial encounter: Secondary | ICD-10-CM

## 2011-07-02 DIAGNOSIS — Z0389 Encounter for observation for other suspected diseases and conditions ruled out: Secondary | ICD-10-CM

## 2011-07-02 DIAGNOSIS — I10 Essential (primary) hypertension: Secondary | ICD-10-CM

## 2011-07-02 DIAGNOSIS — Z0000 Encounter for general adult medical examination without abnormal findings: Secondary | ICD-10-CM

## 2011-07-02 DIAGNOSIS — R972 Elevated prostate specific antigen [PSA]: Secondary | ICD-10-CM | POA: Diagnosis not present

## 2011-07-02 LAB — CBC WITH DIFFERENTIAL/PLATELET
Eosinophils Absolute: 0.4 10*3/uL (ref 0.0–0.7)
MCHC: 33.3 g/dL (ref 30.0–36.0)
MCV: 88.9 fl (ref 78.0–100.0)
Monocytes Absolute: 0.6 10*3/uL (ref 0.1–1.0)
Neutrophils Relative %: 56.3 % (ref 43.0–77.0)
Platelets: 204 10*3/uL (ref 150.0–400.0)
RDW: 14.6 % (ref 11.5–14.6)

## 2011-07-02 LAB — URINALYSIS
Ketones, ur: NEGATIVE
Specific Gravity, Urine: 1.025 (ref 1.000–1.030)
Urine Glucose: NEGATIVE
Urobilinogen, UA: 0.2 (ref 0.0–1.0)
pH: 6 (ref 5.0–8.0)

## 2011-07-02 LAB — BASIC METABOLIC PANEL
Chloride: 104 mEq/L (ref 96–112)
Creatinine, Ser: 1.2 mg/dL (ref 0.4–1.5)

## 2011-07-02 LAB — HEPATIC FUNCTION PANEL
ALT: 25 U/L (ref 0–53)
AST: 30 U/L (ref 0–37)
Alkaline Phosphatase: 58 U/L (ref 39–117)
Bilirubin, Direct: 0.3 mg/dL (ref 0.0–0.3)
Total Protein: 6.8 g/dL (ref 6.0–8.3)

## 2011-07-02 LAB — LIPID PANEL
Cholesterol: 170 mg/dL (ref 0–200)
LDL Cholesterol: 104 mg/dL — ABNORMAL HIGH (ref 0–99)
Triglycerides: 64 mg/dL (ref 0.0–149.0)

## 2011-07-02 LAB — PSA: PSA: 0.41 ng/mL (ref 0.10–4.00)

## 2011-07-03 ENCOUNTER — Encounter: Payer: Self-pay | Admitting: Internal Medicine

## 2011-07-03 ENCOUNTER — Ambulatory Visit (INDEPENDENT_AMBULATORY_CARE_PROVIDER_SITE_OTHER): Payer: Medicare Other | Admitting: Internal Medicine

## 2011-07-03 VITALS — BP 140/70 | HR 80 | Temp 98.0°F | Resp 16 | Wt 203.0 lb

## 2011-07-03 DIAGNOSIS — I10 Essential (primary) hypertension: Secondary | ICD-10-CM | POA: Diagnosis not present

## 2011-07-03 DIAGNOSIS — E785 Hyperlipidemia, unspecified: Secondary | ICD-10-CM | POA: Diagnosis not present

## 2011-07-03 DIAGNOSIS — M545 Low back pain, unspecified: Secondary | ICD-10-CM

## 2011-07-03 DIAGNOSIS — I2510 Atherosclerotic heart disease of native coronary artery without angina pectoris: Secondary | ICD-10-CM | POA: Diagnosis not present

## 2011-07-03 DIAGNOSIS — R7309 Other abnormal glucose: Secondary | ICD-10-CM

## 2011-07-03 NOTE — Assessment & Plan Note (Signed)
Continue with current prescription therapy as reflected on the Med list.  

## 2011-07-03 NOTE — Progress Notes (Signed)
Patient ID: [name] [name], male   DOB: [birth year], 78 y.o.   MRN: [id number]  Subjective:    Patient ID: [name] [name], male    DOB: 1933/02/04, 78 y.o.   MRN: [id number]  HPI The patient presents for a follow-up of chronic dyslipidemia, CAD, elev glu controlled with medicines and HTN  Wt Readings from Last 3 Encounters:  07/03/11 203 lb (92.08 kg)  04/06/11 200 lb (90.719 kg)  04/02/11 200 lb (90.719 kg)   BP Readings from Last 3 Encounters:  07/03/11 140/70  04/06/11 150/74  04/02/11 170/80      Review of Systems  Constitutional: Negative for appetite change, fatigue and unexpected weight change.  HENT: Negative for nosebleeds, congestion, sore throat, sneezing, trouble swallowing and neck pain.   Eyes: Negative for itching and visual disturbance.  Respiratory: Negative for cough.   Cardiovascular: Negative for chest pain, palpitations and leg swelling.  Gastrointestinal: Negative for nausea, diarrhea, blood in stool and abdominal distention.  Genitourinary: Negative for frequency and hematuria.  Musculoskeletal: Negative for back pain, joint swelling and gait problem.  Skin: Negative for rash.  Neurological: Negative for dizziness, tremors, speech difficulty and weakness.  Psychiatric/Behavioral: Negative for sleep disturbance, dysphoric mood and agitation. The patient is not nervous/anxious.        Objective:   Physical Exam  Constitutional: He is oriented to person, place, and time. He appears well-developed.       Obese   HENT:  Mouth/Throat: Oropharynx is clear and moist.  Eyes: Conjunctivae are normal. Pupils are equal, round, and reactive to light.  Neck: Normal range of motion. No JVD present. No thyromegaly present.  Cardiovascular: Normal rate, regular rhythm, normal heart sounds and intact distal pulses.  Exam reveals no gallop and no friction rub.   No murmur heard. Pulmonary/Chest: Effort normal and breath sounds normal. No respiratory distress. He has no  wheezes. He has no rales. He exhibits no tenderness.  Abdominal: Soft. Bowel sounds are normal. He exhibits no distension and no mass. There is no tenderness. There is no rebound and no guarding.  Musculoskeletal: Normal range of motion. He exhibits no edema and no tenderness.  Lymphadenopathy:    He has no cervical adenopathy.  Neurological: He is alert and oriented to person, place, and time. He has normal reflexes. No cranial nerve deficit. He exhibits normal muscle tone. Coordination normal.  Skin: Skin is warm and dry. No rash noted.  Psychiatric: He has a normal mood and affect. His behavior is normal. Judgment and thought content normal.   Lab Results  Component Value Date   WBC 7.2 07/02/2011   HGB 14.6 07/02/2011   HCT 43.8 07/02/2011   PLT 204.0 07/02/2011   GLUCOSE 98 07/02/2011   CHOL 170 07/02/2011   TRIG 64.0 07/02/2011   HDL 53.20 07/02/2011   LDLDIRECT 136.2 09/28/2009   LDLCALC 104* 07/02/2011   ALT 25 07/02/2011   AST 30 07/02/2011   NA 140 07/02/2011   K 4.2 07/02/2011   CL 104 07/02/2011   CREATININE 1.2 07/02/2011   BUN 18 07/02/2011   CO2 27 07/02/2011   TSH 1.25 07/02/2011   PSA 0.41 07/02/2011   INR 1.1 ratio* 10/06/2009   HGBA1C 5.8 03/30/2011         Assessment & Plan:

## 2011-07-03 NOTE — Assessment & Plan Note (Signed)
Watching labs 

## 2011-09-26 DIAGNOSIS — H4011X0 Primary open-angle glaucoma, stage unspecified: Secondary | ICD-10-CM | POA: Diagnosis not present

## 2011-09-26 DIAGNOSIS — H2510 Age-related nuclear cataract, unspecified eye: Secondary | ICD-10-CM | POA: Diagnosis not present

## 2011-10-08 ENCOUNTER — Other Ambulatory Visit (INDEPENDENT_AMBULATORY_CARE_PROVIDER_SITE_OTHER): Payer: Medicare Other

## 2011-10-08 DIAGNOSIS — E785 Hyperlipidemia, unspecified: Secondary | ICD-10-CM

## 2011-10-08 DIAGNOSIS — R7309 Other abnormal glucose: Secondary | ICD-10-CM | POA: Diagnosis not present

## 2011-10-08 DIAGNOSIS — M545 Low back pain, unspecified: Secondary | ICD-10-CM

## 2011-10-08 DIAGNOSIS — I2510 Atherosclerotic heart disease of native coronary artery without angina pectoris: Secondary | ICD-10-CM

## 2011-10-08 DIAGNOSIS — I10 Essential (primary) hypertension: Secondary | ICD-10-CM | POA: Diagnosis not present

## 2011-10-08 LAB — BASIC METABOLIC PANEL
Calcium: 9.3 mg/dL (ref 8.4–10.5)
Creatinine, Ser: 1.2 mg/dL (ref 0.4–1.5)
GFR: 79.33 mL/min (ref >60.00)
Sodium: 140 mEq/L (ref 135–145)

## 2011-10-08 LAB — TSH: TSH: 1.49 u[IU]/mL (ref 0.35–5.50)

## 2011-10-09 ENCOUNTER — Encounter: Payer: Self-pay | Admitting: Internal Medicine

## 2011-10-09 ENCOUNTER — Ambulatory Visit (INDEPENDENT_AMBULATORY_CARE_PROVIDER_SITE_OTHER): Payer: Medicare Other | Admitting: Internal Medicine

## 2011-10-09 VITALS — BP 130/62 | HR 80 | Temp 99.4°F | Resp 16 | Wt 199.0 lb

## 2011-10-09 DIAGNOSIS — E785 Hyperlipidemia, unspecified: Secondary | ICD-10-CM

## 2011-10-09 DIAGNOSIS — I10 Essential (primary) hypertension: Secondary | ICD-10-CM

## 2011-10-09 DIAGNOSIS — M545 Low back pain: Secondary | ICD-10-CM

## 2011-10-09 DIAGNOSIS — E119 Type 2 diabetes mellitus without complications: Secondary | ICD-10-CM | POA: Diagnosis not present

## 2011-10-09 DIAGNOSIS — I2510 Atherosclerotic heart disease of native coronary artery without angina pectoris: Secondary | ICD-10-CM

## 2011-10-09 NOTE — Assessment & Plan Note (Signed)
Continue with current prescription therapy as reflected on the Med list.  

## 2011-10-09 NOTE — Progress Notes (Signed)
   Subjective:    Patient ID: [name] [name], male    DOB: 11/14/1932, 78 y.o.   MRN: [id number]  HPI The patient presents for a follow-up of chronic dyslipidemia, CAD, elev glu controlled with medicines and HTN. He had chest pressure 5-6 d ago that was relieved w/NTG x2  Wt Readings from Last 3 Encounters:  10/09/11 199 lb (90.266 kg)  07/03/11 203 lb (92.08 kg)  04/06/11 200 lb (90.719 kg)   BP Readings from Last 3 Encounters:  10/09/11 130/62  07/03/11 140/70  04/06/11 150/74      Review of Systems  Constitutional: Negative for appetite change, fatigue and unexpected weight change.  HENT: Negative for nosebleeds, congestion, sore throat, sneezing, trouble swallowing and neck pain.   Eyes: Negative for itching and visual disturbance.  Respiratory: Negative for cough.   Cardiovascular: Negative for chest pain, palpitations and leg swelling.  Gastrointestinal: Negative for nausea, diarrhea, blood in stool and abdominal distention.  Genitourinary: Negative for frequency and hematuria.  Musculoskeletal: Negative for back pain, joint swelling and gait problem.  Skin: Negative for rash.  Neurological: Negative for dizziness, tremors, speech difficulty and weakness.  Psychiatric/Behavioral: Negative for disturbed wake/sleep cycle, dysphoric mood and agitation. The patient is not nervous/anxious.        Objective:   Physical Exam  Constitutional: He is oriented to person, place, and time. He appears well-developed.       Obese   HENT:  Mouth/Throat: Oropharynx is clear and moist.  Eyes: Conjunctivae are normal. Pupils are equal, round, and reactive to light.  Neck: Normal range of motion. No JVD present. No thyromegaly present.  Cardiovascular: Normal rate, regular rhythm and intact distal pulses.  Exam reveals no gallop and no friction rub.   Murmur (2-3/6 sys) heard. Pulmonary/Chest: Effort normal and breath sounds normal. No respiratory distress. He has no wheezes. He has no  rales. He exhibits no tenderness.  Abdominal: Soft. Bowel sounds are normal. He exhibits no distension and no mass. There is no tenderness. There is no rebound and no guarding.  Musculoskeletal: Normal range of motion. He exhibits no edema and no tenderness.  Lymphadenopathy:    He has no cervical adenopathy.  Neurological: He is alert and oriented to person, place, and time. He has normal reflexes. No cranial nerve deficit. He exhibits normal muscle tone. Coordination normal.  Skin: Skin is warm and dry. No rash noted.  Psychiatric: He has a normal mood and affect. His behavior is normal. Judgment and thought content normal.   Lab Results  Component Value Date   WBC 7.2 07/02/2011   HGB 14.6 07/02/2011   HCT 43.8 07/02/2011   PLT 204.0 07/02/2011   GLUCOSE 106* 10/08/2011   CHOL 170 07/02/2011   TRIG 64.0 07/02/2011   HDL 53.20 07/02/2011   LDLDIRECT 136.2 09/28/2009   LDLCALC 104* 07/02/2011   ALT 25 07/02/2011   AST 30 07/02/2011   NA 140 10/08/2011   K 3.8 10/08/2011   CL 106 10/08/2011   CREATININE 1.2 10/08/2011   BUN 18 10/08/2011   CO2 24 10/08/2011   TSH 1.49 10/08/2011   PSA 0.41 07/02/2011   INR 1.1 ratio* 10/06/2009   HGBA1C 6.0 10/08/2011         Assessment & Plan:

## 2011-10-09 NOTE — Assessment & Plan Note (Signed)
Chronic. stable angina - rare Continue with current prescription therapy as reflected on the Med list. Card f/u

## 2011-10-16 DIAGNOSIS — B351 Tinea unguium: Secondary | ICD-10-CM | POA: Diagnosis not present

## 2011-10-16 DIAGNOSIS — M79609 Pain in unspecified limb: Secondary | ICD-10-CM | POA: Diagnosis not present

## 2011-10-16 DIAGNOSIS — L608 Other nail disorders: Secondary | ICD-10-CM | POA: Diagnosis not present

## 2011-12-10 ENCOUNTER — Encounter: Payer: Self-pay | Admitting: Cardiology

## 2011-12-10 ENCOUNTER — Ambulatory Visit (INDEPENDENT_AMBULATORY_CARE_PROVIDER_SITE_OTHER): Payer: Medicare Other | Admitting: Cardiology

## 2011-12-10 VITALS — BP 122/62 | HR 59 | Ht 66.0 in | Wt 197.0 lb

## 2011-12-10 DIAGNOSIS — E785 Hyperlipidemia, unspecified: Secondary | ICD-10-CM

## 2011-12-10 DIAGNOSIS — I4510 Unspecified right bundle-branch block: Secondary | ICD-10-CM

## 2011-12-10 DIAGNOSIS — I2510 Atherosclerotic heart disease of native coronary artery without angina pectoris: Secondary | ICD-10-CM | POA: Diagnosis not present

## 2011-12-10 DIAGNOSIS — I10 Essential (primary) hypertension: Secondary | ICD-10-CM

## 2011-12-10 DIAGNOSIS — I872 Venous insufficiency (chronic) (peripheral): Secondary | ICD-10-CM

## 2011-12-10 DIAGNOSIS — I6529 Occlusion and stenosis of unspecified carotid artery: Secondary | ICD-10-CM | POA: Diagnosis not present

## 2011-12-10 NOTE — Assessment & Plan Note (Signed)
Stable. Continue secondary preventative therapy. Return the office in one year. 

## 2011-12-10 NOTE — Assessment & Plan Note (Signed)
Much improved. Continue current medical program.

## 2011-12-10 NOTE — Progress Notes (Signed)
HPI [name] returns today for evaluation and management of his coronary artery disease, carotid disease, and multiple risk factors. His biggest issue in the last several months has been blood pressure control. It is much better today with a good resting heart rate.  He's had no angina or ischemic symptoms. His last carotid Dopplers were in December 2012 were stable. He denies any active symptoms of TIAs.  He's very compliant with his meds. He has not lost any weight however.  Past Medical History  Diagnosis Date  . Coronary artery disease   . Hypertension   . Peripheral vascular disease   . Carotid artery disease   . Hyperlipidemia   . Glucose intolerance (impaired glucose tolerance)   . DJD (degenerative joint disease)     right hip  . Low back pain     facet arthropathy  . Hx of colonic polyps 2004    hyperplastic  . Glaucoma   . Herpes zoster 2009    r. scalp and opth  . Postherpetic neuralgia 2009  . Pneumonia 2009    rll with a small effusion    Current Outpatient Prescriptions  Medication Sig Dispense Refill  . amLODipine (NORVASC) 5 MG tablet Take 1 tablet (5 mg total) by mouth daily.  90 tablet  3  . aspirin 325 MG tablet Take 325 mg by mouth daily.        . Cholecalciferol (VITAMIN D3) 1000 UNITS CAPS Take 1 capsule by mouth daily.        . clopidogrel (PLAVIX) 75 MG tablet Take 1 tablet (75 mg total) by mouth daily.  90 tablet  3  . ezetimibe (ZETIA) 10 MG tablet Take 1 tablet (10 mg total) by mouth daily.  90 tablet  3  . isosorbide mononitrate (IMDUR) 60 MG 24 hr tablet Take 1 tablet (60 mg total) by mouth daily.  90 tablet  3  . losartan (COZAAR) 100 MG tablet Take 1 tablet (100 mg total) by mouth daily.  90 tablet  3  . metoprolol-hydrochlorothiazide (LOPRESSOR HCT) 100-25 MG per tablet Take 1 tablet by mouth daily.  90 tablet  3  . nitroGLYCERIN (NITROSTAT) 0.4 MG SL tablet Place 0.4 mg under the tongue every 5 (five) minutes as needed.        . rosuvastatin  (CRESTOR) 40 MG tablet Take 1 tablet (40 mg total) by mouth daily.  90 tablet  3  . travoprost, benzalkonium, (TRAVATAN) 0.004 % ophthalmic solution 1 drop at bedtime.          Allergies  Allergen Reactions  . Benazepril Hcl     REACTION: cough  . Ezetimibe-Simvastatin     REACTION: aches    Family History  Problem Relation Age of Onset  . Breast cancer Mother   . Cancer Mother     breast  . Heart disease Father     History   Social History  . Marital Status: Divorced    Spouse Name: N/A    Number of Children: 3  . Years of Education: N/A   Occupational History  .     Social History Main Topics  . Smoking status: Former Games developer  . Smokeless tobacco: Not on file   Comment: quit by the time he was 78 yrs old  . Alcohol Use: No  . Drug Use: No  . Sexually Active: Not Currently   Other Topics Concern  . Not on file   Social History Narrative  . No narrative on  file    ROS ALL NEGATIVE EXCEPT THOSE NOTED IN HPI  PE  General Appearance: well developed, well nourished in no acute distress, obese HEENT: symmetrical face, PERRLA, good dentition  Neck: no JVD, thyromegaly, or adenopathy, trachea midline Chest: symmetric without deformity Cardiac: PMI non-displaced, RRR, normal S1, S2, no gallop or murmur Lung: clear to ausculation and percussion Vascular: all pulses full without bruits  Abdominal: nondistended, nontender, good bowel sounds, no HSM, no bruits Extremities: no cyanosis, clubbing or edema, no sign of DVT, no varicosities  Skin: normal color, no rashes Neuro: alert and oriented x 3, non-focal Pysch: normal affect  EKG Sinus bradycardia, right bundle branch block, ST changes and 1 aVL V4 through V6. No acute changes. BMET    Component Value Date/Time   NA 140 10/08/2011 1604   K 3.8 10/08/2011 1604   CL 106 10/08/2011 1604   CO2 24 10/08/2011 1604   GLUCOSE 106* 10/08/2011 1604   BUN 18 10/08/2011 1604   CREATININE 1.2 10/08/2011 1604   CALCIUM 9.3  10/08/2011 1604   GFRNONAA >60 09/21/2010 1813   GFRAA  Value: >60        The eGFR has been calculated using the MDRD equation. This calculation has not been validated in all clinical situations. eGFR's persistently <60 mL/min signify possible Chronic Kidney Disease. 09/21/2010 1813    Lipid Panel     Component Value Date/Time   CHOL 170 07/02/2011 1554   TRIG 64.0 07/02/2011 1554   HDL 53.20 07/02/2011 1554   CHOLHDL 3 07/02/2011 1554   VLDL 12.8 07/02/2011 1554   LDLCALC 104* 07/02/2011 1554    CBC    Component Value Date/Time   WBC 7.2 07/02/2011 1554   RBC 4.92 07/02/2011 1554   HGB 14.6 07/02/2011 1554   HCT 43.8 07/02/2011 1554   PLT 204.0 07/02/2011 1554   MCV 88.9 07/02/2011 1554   MCH 30.1 09/21/2010 1813   MCHC 33.3 07/02/2011 1554   RDW 14.6 07/02/2011 1554   LYMPHSABS 2.2 07/02/2011 1554   MONOABS 0.6 07/02/2011 1554   EOSABS 0.4 07/02/2011 1554   BASOSABS 0.0 07/02/2011 1554

## 2011-12-10 NOTE — Patient Instructions (Addendum)
Your physician has requested that you have a carotid duplex in December. This test is an ultrasound of the carotid arteries in your neck. It looks at blood flow through these arteries that supply the brain with blood. Allow one hour for this exam. There are no restrictions or special instructions.  Your physician wants you to follow-up in: 1 year.   You will receive a reminder letter in the mail two months in advance. If you don't receive a letter, please call our office to schedule the follow-up appointment.

## 2011-12-10 NOTE — Assessment & Plan Note (Signed)
Asymptomatic. Dopplers were stable in December. Repeat this December of 13. Continue secondary preventative therapy.

## 2011-12-27 DIAGNOSIS — H2510 Age-related nuclear cataract, unspecified eye: Secondary | ICD-10-CM | POA: Diagnosis not present

## 2011-12-27 DIAGNOSIS — H4011X0 Primary open-angle glaucoma, stage unspecified: Secondary | ICD-10-CM | POA: Diagnosis not present

## 2012-01-10 ENCOUNTER — Other Ambulatory Visit (INDEPENDENT_AMBULATORY_CARE_PROVIDER_SITE_OTHER): Payer: Medicare Other

## 2012-01-10 DIAGNOSIS — I10 Essential (primary) hypertension: Secondary | ICD-10-CM | POA: Diagnosis not present

## 2012-01-10 DIAGNOSIS — I2510 Atherosclerotic heart disease of native coronary artery without angina pectoris: Secondary | ICD-10-CM

## 2012-01-10 DIAGNOSIS — E119 Type 2 diabetes mellitus without complications: Secondary | ICD-10-CM

## 2012-01-10 DIAGNOSIS — E785 Hyperlipidemia, unspecified: Secondary | ICD-10-CM

## 2012-01-10 LAB — LIPID PANEL
HDL: 57.1 mg/dL (ref >39.00)
Triglycerides: 92 mg/dL (ref 0.0–149.0)
VLDL: 18.4 mg/dL (ref 0.0–40.0)

## 2012-01-10 LAB — BASIC METABOLIC PANEL
Calcium: 9.2 mg/dL (ref 8.4–10.5)
Creatinine, Ser: 1.1 mg/dL (ref 0.4–1.5)
GFR: 80.08 mL/min (ref >60.00)
Glucose, Bld: 97 mg/dL (ref 70–99)
Sodium: 138 mEq/L (ref 135–145)

## 2012-01-10 LAB — HEPATIC FUNCTION PANEL
Albumin: 4 g/dL (ref 3.5–5.2)
Alkaline Phosphatase: 64 U/L (ref 39–117)

## 2012-01-10 LAB — HEMOGLOBIN A1C: Hgb A1c MFr Bld: 5.7 % (ref 4.6–6.5)

## 2012-01-11 ENCOUNTER — Encounter: Payer: Self-pay | Admitting: Internal Medicine

## 2012-01-11 ENCOUNTER — Ambulatory Visit (INDEPENDENT_AMBULATORY_CARE_PROVIDER_SITE_OTHER): Payer: Medicare Other | Admitting: Internal Medicine

## 2012-01-11 VITALS — BP 150/68 | HR 80 | Temp 99.0°F | Resp 16 | Wt 195.0 lb

## 2012-01-11 DIAGNOSIS — R7309 Other abnormal glucose: Secondary | ICD-10-CM | POA: Diagnosis not present

## 2012-01-11 DIAGNOSIS — E785 Hyperlipidemia, unspecified: Secondary | ICD-10-CM

## 2012-01-11 DIAGNOSIS — I2510 Atherosclerotic heart disease of native coronary artery without angina pectoris: Secondary | ICD-10-CM | POA: Diagnosis not present

## 2012-01-11 DIAGNOSIS — I10 Essential (primary) hypertension: Secondary | ICD-10-CM | POA: Diagnosis not present

## 2012-01-11 DIAGNOSIS — M545 Low back pain, unspecified: Secondary | ICD-10-CM

## 2012-01-11 MED ORDER — AMLODIPINE BESYLATE 5 MG PO TABS: 5.0000 mg | ORAL_TABLET | Freq: Every day | ORAL | Status: DC

## 2012-01-11 MED ORDER — METOPROLOL-HYDROCHLOROTHIAZIDE 100-25 MG PO TABS: 1.0000 | ORAL_TABLET | Freq: Every day | ORAL | Status: DC

## 2012-01-11 MED ORDER — EZETIMIBE 10 MG PO TABS: 10.0000 mg | ORAL_TABLET | Freq: Every day | ORAL | Status: DC

## 2012-01-11 MED ORDER — ROSUVASTATIN CALCIUM 40 MG PO TABS: 40.0000 mg | ORAL_TABLET | Freq: Every day | ORAL | Status: DC

## 2012-01-11 MED ORDER — ISOSORBIDE MONONITRATE ER 60 MG PO TB24: 60.0000 mg | ORAL_TABLET | Freq: Every day | ORAL | Status: DC

## 2012-01-11 MED ORDER — CLOPIDOGREL BISULFATE 75 MG PO TABS: 75.0000 mg | ORAL_TABLET | Freq: Every day | ORAL | Status: DC

## 2012-01-11 NOTE — Assessment & Plan Note (Signed)
Monitoring CBGs On diet

## 2012-01-11 NOTE — Assessment & Plan Note (Signed)
Continue with current prescription therapy as reflected on the Med list.  

## 2012-01-11 NOTE — Assessment & Plan Note (Signed)
Doing good  

## 2012-01-11 NOTE — Progress Notes (Signed)
   Subjective:    Patient ID: [name] [name], male    DOB: [birth year], 78 y.o.   MRN: [id number]  HPI  The patient presents for a follow-up of chronic dyslipidemia, CAD, elev glu controlled with medicines and HTN. He had chest pressure 5-6 d ago that was relieved w/NTG x2 BP is nl at home  Wt Readings from Last 3 Encounters:  01/11/12 195 lb (88.451 kg)  12/10/11 197 lb (89.359 kg)  10/09/11 199 lb (90.266 kg)   BP Readings from Last 3 Encounters:  01/11/12 150/68  12/10/11 122/62  10/09/11 130/62      Review of Systems  Constitutional: Negative for appetite change, fatigue and unexpected weight change.  HENT: Negative for nosebleeds, congestion, sore throat, sneezing, trouble swallowing and neck pain.   Eyes: Negative for itching and visual disturbance.  Respiratory: Negative for cough.   Cardiovascular: Negative for chest pain, palpitations and leg swelling.  Gastrointestinal: Negative for nausea, diarrhea, blood in stool and abdominal distention.  Genitourinary: Negative for frequency and hematuria.  Musculoskeletal: Negative for back pain, joint swelling and gait problem.  Skin: Negative for rash.  Neurological: Negative for dizziness, tremors, speech difficulty and weakness.  Psychiatric/Behavioral: Negative for disturbed wake/sleep cycle, dysphoric mood and agitation. The patient is not nervous/anxious.        Objective:   Physical Exam  Constitutional: He is oriented to person, place, and time. He appears well-developed.       Obese   HENT:  Mouth/Throat: Oropharynx is clear and moist.  Eyes: Conjunctivae normal are normal. Pupils are equal, round, and reactive to light.  Neck: Normal range of motion. No JVD present. No thyromegaly present.  Cardiovascular: Normal rate, regular rhythm and intact distal pulses.  Exam reveals no gallop and no friction rub.   Murmur (2-3/6 sys) heard. Pulmonary/Chest: Effort normal and breath sounds normal. No respiratory distress.  He has no wheezes. He has no rales. He exhibits no tenderness.  Abdominal: Soft. Bowel sounds are normal. He exhibits no distension and no mass. There is no tenderness. There is no rebound and no guarding.  Musculoskeletal: Normal range of motion. He exhibits no edema and no tenderness.  Lymphadenopathy:    He has no cervical adenopathy.  Neurological: He is alert and oriented to person, place, and time. He has normal reflexes. No cranial nerve deficit. He exhibits normal muscle tone. Coordination normal.  Skin: Skin is warm and dry. No rash noted.  Psychiatric: He has a normal mood and affect. His behavior is normal. Judgment and thought content normal.   Lab Results  Component Value Date   WBC 7.2 07/02/2011   HGB 14.6 07/02/2011   HCT 43.8 07/02/2011   PLT 204.0 07/02/2011   GLUCOSE 97 01/10/2012   CHOL 191 01/10/2012   TRIG 92.0 01/10/2012   HDL 57.10 01/10/2012   LDLDIRECT 136.2 09/28/2009   LDLCALC 116* 01/10/2012   ALT 31 01/10/2012   AST 32 01/10/2012   NA 138 01/10/2012   K 4.0 01/10/2012   CL 104 01/10/2012   CREATININE 1.1 01/10/2012   BUN 15 01/10/2012   CO2 24 01/10/2012   TSH 1.49 10/08/2011   PSA 0.41 07/02/2011   INR 1.1 ratio* 10/06/2009   HGBA1C 5.7 01/10/2012         Assessment & Plan:

## 2012-01-21 ENCOUNTER — Other Ambulatory Visit: Payer: Self-pay | Admitting: Ophthalmology

## 2012-01-21 DIAGNOSIS — H2589 Other age-related cataract: Secondary | ICD-10-CM | POA: Diagnosis not present

## 2012-01-21 NOTE — H&P (Signed)
Pre-operative History and Physical for Ophthalmic Surgery  [name] [name] 01/21/2012                  Chief Complaint: Decreased vision Left Eye  Diagnosis:  Combined Cataract   Allergies  Allergen Reactions  . Benazepril Hcl     REACTION: cough  . Ezetimibe-Simvastatin     REACTION: aches    Prior to Admission medications   Medication Sig Start Date End Date Taking? Authorizing Provider  amLODipine (NORVASC) 5 MG tablet Take 1 tablet (5 mg total) by mouth daily. 01/11/12   Aleksei V Plotnikov, MD  aspirin 325 MG tablet Take 325 mg by mouth daily.      Historical Provider, MD  Cholecalciferol (VITAMIN D3) 1000 UNITS CAPS Take 1 capsule by mouth daily.      Historical Provider, MD  clopidogrel (PLAVIX) 75 MG tablet Take 1 tablet (75 mg total) by mouth daily. 01/11/12   Aleksei V Plotnikov, MD  ezetimibe (ZETIA) 10 MG tablet Take 1 tablet (10 mg total) by mouth daily. 01/11/12   Aleksei V Plotnikov, MD  isosorbide mononitrate (IMDUR) 60 MG 24 hr tablet Take 1 tablet (60 mg total) by mouth daily. 01/11/12   Aleksei V Plotnikov, MD  losartan (COZAAR) 100 MG tablet Take 1 tablet (100 mg total) by mouth daily. 04/02/11 04/01/12  Aleksei V Plotnikov, MD  metoprolol-hydrochlorothiazide (LOPRESSOR HCT) 100-25 MG per tablet Take 1 tablet by mouth daily. 01/11/12   Aleksei V Plotnikov, MD  nitroGLYCERIN (NITROSTAT) 0.4 MG SL tablet Place 0.4 mg under the tongue every 5 (five) minutes as needed.      Historical Provider, MD  rosuvastatin (CRESTOR) 40 MG tablet Take 1 tablet (40 mg total) by mouth daily. 01/11/12   Aleksei V Plotnikov, MD  travoprost, benzalkonium, (TRAVATAN) 0.004 % ophthalmic solution 1 drop at bedtime.      Historical Provider, MD    Planned Procedure:                                       Phacoemulsification, Posterior Chamber Intra-ocular Lens Left Eye                                       Acrysof MA50BM  + 11. 50 Diopter for implant OS   There were no vitals filed for this  visit.  Pulse: 70         Temp: NE        Resp:  18      ROS: negative apart from vision symtoms  Past Medical History  Diagnosis Date  . Coronary artery disease   . Hypertension   . Peripheral vascular disease   . Carotid artery disease   . Hyperlipidemia   . Glucose intolerance (impaired glucose tolerance)   . DJD (degenerative joint disease)     right hip  . Low back pain     facet arthropathy  . Hx of colonic polyps 2004    hyperplastic  . Glaucoma(365)   . Herpes zoster 2009    r. scalp and opth  . Postherpetic neuralgia 2009  . Pneumonia 2009    rll with a small effusion    Past Surgical History  Procedure Date  . Coronary artery disease     stent 2010  . Carpal   tunnel release     left  . Thumb tendon surgery     left  . Tonsillectomy   . Acne cyst removal 1970's    removed x2 off the back  . Coronary artery bypass graft 2002     History   Social History  . Marital Status: Divorced    Spouse Name: N/A    Number of Children: 3  . Years of Education: N/A   Occupational History  .     Social History Main Topics  . Smoking status: Former Smoker  . Smokeless tobacco: Not on file   Comment: quit by the time he was 78 yrs old  . Alcohol Use: No  . Drug Use: No  . Sexually Active: Not Currently   Other Topics Concern  . Not on file   Social History Narrative  . No narrative on file     The following examination is for anesthesia clearance for minimally invasive Ophthalmic surgery. It is primarily to document heart and lung findings and is not intended to elucidate unknown general medical conditions inclusive of abdominal masses, lung lesions, etc.   General Constitution:  WNL   Alertness/Orientation:  Person, time place     yes   HEENT:  Eye Findings:  Combined Cataract OS                   left eye  Neck: supple without masses  Chest/Lungs: clear to auscultation  Cardiac: Normal S1 and S2 without Murmur, S3 or S4  Neuro:  non-focal  Impression:  Combined Cataract OS   Planned Procedure:  Phacoemulsification, Posterior Chamber Intraocular Lens OS    [name] Dvorsky, MD        

## 2012-02-01 ENCOUNTER — Encounter (HOSPITAL_COMMUNITY): Payer: Self-pay | Admitting: Pharmacy Technician

## 2012-02-05 NOTE — Pre-Procedure Instructions (Signed)
20 BRAYLYN RAPIER  02/05/2012   Your procedure is scheduled on:  Wednesday October 30  Report to [facility] Stay [facility] at 11:30 AM.  Call this number if you have problems the morning of surgery: [phone]   Remember:   Do not eat or drink:After Midnight.    Take these medicines the morning of surgery with A SIP OF WATER: amlodipine (Norvasc), isosorbide (Imdur), metoprolol-hctz (Lopressor HCT). May use eye drops. Bring Nitroglycerin to surgery.    Do not wear jewelry, make-up or nail polish.  Do not wear lotions, powders, or perfumes. You may wear deodorant.  Do not shave 48 hours prior to surgery. Men may shave face and neck.  Do not bring valuables to the hospital.  Contacts, dentures or bridgework may not be worn into surgery.  Leave suitcase in the car. After surgery it may be brought to your room.  For patients admitted to the hospital, checkout time is 11:00 AM the day of discharge.   Patients discharged the day of surgery will not be allowed to drive home.  Name and phone number of your driver: family  Special Instructions: Shower using CHG 2 nights before surgery and the night before surgery.  If you shower the day of surgery use CHG.  Use special wash - you have one bottle of CHG for all showers.  You should use approximately 1/3 of the bottle for each shower.   Please read over the following fact sheets that you were given: Pain Booklet, Coughing and Deep Breathing and Surgical Site Infection Prevention

## 2012-02-06 ENCOUNTER — Encounter (HOSPITAL_COMMUNITY)
Admission: RE | Admit: 2012-02-06 | Discharge: 2012-02-06 | Disposition: A | Discharge status: Home / Self Care | Payer: Medicare Other | Source: Ambulatory Visit | Attending: Ophthalmology | Admitting: Ophthalmology

## 2012-02-06 ENCOUNTER — Encounter (HOSPITAL_COMMUNITY): Payer: Self-pay

## 2012-02-06 ENCOUNTER — Ambulatory Visit (HOSPITAL_COMMUNITY)
Admission: RE | Admit: 2012-02-06 | Discharge: 2012-02-06 | Disposition: A | Discharge status: Home / Self Care | Payer: Medicare Other | Source: Ambulatory Visit | Attending: Ophthalmology | Admitting: Ophthalmology

## 2012-02-06 DIAGNOSIS — Z01812 Encounter for preprocedural laboratory examination: Secondary | ICD-10-CM | POA: Diagnosis not present

## 2012-02-06 DIAGNOSIS — Z01818 Encounter for other preprocedural examination: Secondary | ICD-10-CM | POA: Insufficient documentation

## 2012-02-06 DIAGNOSIS — Z01810 Encounter for preprocedural cardiovascular examination: Secondary | ICD-10-CM | POA: Diagnosis not present

## 2012-02-06 DIAGNOSIS — Z01811 Encounter for preprocedural respiratory examination: Secondary | ICD-10-CM | POA: Diagnosis not present

## 2012-02-06 HISTORY — DX: Low back pain, unspecified: M54.50

## 2012-02-06 HISTORY — DX: Low back pain: M54.5

## 2012-02-06 LAB — CBC
HCT: 44.1 % (ref 39.0–52.0)
Hemoglobin: 15 g/dL (ref 13.0–17.0)
MCHC: 34 g/dL (ref 30.0–36.0)
RBC: 5.11 MIL/uL (ref 4.22–5.81)
WBC: 8.3 10*3/uL (ref 4.0–10.5)

## 2012-02-06 LAB — BASIC METABOLIC PANEL
Chloride: 102 mEq/L (ref 96–112)
GFR calc Af Amer: 69 mL/min — ABNORMAL LOW (ref >90)
GFR calc non Af Amer: 60 mL/min — ABNORMAL LOW (ref >90)
Potassium: 4.2 mEq/L (ref 3.5–5.1)
Sodium: 137 mEq/L (ref 135–145)

## 2012-02-06 NOTE — Progress Notes (Signed)
Notified [place], [place] regarding patient reports of chest pressure ~ 1 month ago.

## 2012-02-06 NOTE — Consult Note (Signed)
Anesthesia consult: Patient is a 78 year old male scheduled for left cataract extraction by Dr. Clarisa Kindred on 02/13/2012. History includes former smoker, hypertension, CAD s/p CABG '02, NSTEMI 11/2008 s/p DES CX, hyperlipidemia, DJD, pneumonia, impaired glucose tolerance, carotid artery disease (40-59% bilateral ICAS by duplex 04/06/11).  PCP is Dr. Posey Rea.  His cardiologist is Dr. Daleen Squibb, last visit 12/10/11.  One year follow-up was recommended at that time.  Dr. Daleen Squibb is out of town until 02/13/12.    I was asked to see [name] [name] during his PAT visit for reported history of chest pain in early September 2013.  Patient did notify his PCP at his September 27 13 visit continued medical therapy was suggested. Patient reports that he had to take nitroglycerin approximately 5-6 months ago when he first started Losartan (he has seen Dr. Daleen Squibb since). His most recent episode in September felt like mild chest tightness that lasted for less than 1 minute. He had no associated symptoms such as dizziness, SOB, nausea, palpitations, jaw or arm pain. It did not feel like the pain that he had at the time of his myocardial infarction in 2010.  He has since be active without recurrent symptoms (ie, walks on glider for 30 minutes 4X/week, spent 90 minutes doing tree trimming, and was able to walk about two blocks to get to the hospital this morning).   EKG on 02/06/2012 showed SB, right BBB, T wave abnormality, consider inferolateral ischemia.  It is not appear significantly changed from his EKG from 12/10/2011.  Nuclear stress test on 09/25/10 showed: Low risk study. Qualtiative mild anterolateral ischemia no infarct. EF and QPS normal with baseline RBBB and lateral T wave inversions.  EF 58%.  Cardiac cath on 10/11/09 showed severe native three-vessel coronary artery disease. Patent stent in the circumflex. LIMA to LAD graft widely patent. Radial graft to PDA widely patent. His SVG to the OM and SVG to diagonal/LAD occluded  (chronic), EF 65% with normal wall motion. Aggressive medical management recommended.  Echo on 03/20/06 showed: - Overall left ventricular systolic function was normal. Left ventricular ejection fraction was estimated to be 60 %. There was no diagnostic evidence of left ventricular regional wall motion abnormalities. Left ventricular wall thickness was mildly increased. - Aortic valve thickness was mildly to moderately increased. There was lower normal aortic valve leaflet excursion. - Normal mitral valve. - The left atrium was dilated.  Exam shows a pleasant black male in no acute distress. Heart has a regular rate and rhythm. There is a 2-3/6 systolic ejection murmur (this was documented in his PCP notes and also on his Cardiology evaluation with Dr. Dietrich Pates in June 2012).  Lung sounds are clear. There is no significant lower extremity edema.   Chest x-ray on 02/06/2012 showed no evidence of acute cardiopulmonary disease.  Labs noted.  Glucose 108.  Although he does have a history of CAD, he has not had nitroglycerin in approximately 6 months. He has seen Dr. Daleen Squibb since then. His episode in September was mild and was relieved within 1 minute without treatment. He has been overall active since and has not had recurrent symptoms.  He had a low risk stress test in June 2012.  I reviewed above with anesthesiologist Dr. Noreene Larsson who agrees that if no recurrent symptoms or significant change in his status then plan to proceed with this procedure.   Shonna Chock, PA-C

## 2012-02-08 DIAGNOSIS — L608 Other nail disorders: Secondary | ICD-10-CM | POA: Diagnosis not present

## 2012-02-08 DIAGNOSIS — L600 Ingrowing nail: Secondary | ICD-10-CM | POA: Diagnosis not present

## 2012-02-08 DIAGNOSIS — M79609 Pain in unspecified limb: Secondary | ICD-10-CM | POA: Diagnosis not present

## 2012-02-12 MED ORDER — PREDNISOLONE ACETATE 1 % OP SUSP
1.0000 [drp] | OPHTHALMIC | Status: AC
  Administered 2012-02-13: 1 [drp] via OPHTHALMIC
  Filled 2012-02-12: qty 5

## 2012-02-12 MED ORDER — GATIFLOXACIN 0.5 % OP SOLN
1.0000 [drp] | OPHTHALMIC | Status: AC | PRN
  Administered 2012-02-13 (×3): 1 [drp] via OPHTHALMIC
  Filled 2012-02-12: qty 2.5

## 2012-02-12 MED ORDER — PHENYLEPHRINE HCL 2.5 % OP SOLN
1.0000 [drp] | OPHTHALMIC | Status: AC | PRN
  Administered 2012-02-13 (×3): 1 [drp] via OPHTHALMIC
  Filled 2012-02-12: qty 3

## 2012-02-12 MED ORDER — TETRACAINE HCL 0.5 % OP SOLN
2.0000 [drp] | OPHTHALMIC | Status: AC
  Administered 2012-02-13: 2 [drp] via OPHTHALMIC
  Filled 2012-02-12: qty 2

## 2012-02-13 ENCOUNTER — Ambulatory Visit (HOSPITAL_COMMUNITY): Payer: Medicare Other | Admitting: Vascular Surgery

## 2012-02-13 ENCOUNTER — Ambulatory Visit (HOSPITAL_COMMUNITY)
Admission: RE | Admit: 2012-02-13 | Discharge: 2012-02-13 | Disposition: A | Discharge status: Home / Self Care | Payer: Medicare Other | Source: Ambulatory Visit | Attending: Ophthalmology | Admitting: Ophthalmology

## 2012-02-13 ENCOUNTER — Encounter (HOSPITAL_COMMUNITY)
Admission: RE | Disposition: A | Discharge status: Home / Self Care | Payer: Self-pay | Source: Ambulatory Visit | Attending: Ophthalmology

## 2012-02-13 ENCOUNTER — Encounter (HOSPITAL_COMMUNITY): Payer: Self-pay | Admitting: Surgery

## 2012-02-13 ENCOUNTER — Encounter (HOSPITAL_COMMUNITY): Payer: Self-pay | Admitting: Vascular Surgery

## 2012-02-13 DIAGNOSIS — I2510 Atherosclerotic heart disease of native coronary artery without angina pectoris: Secondary | ICD-10-CM | POA: Diagnosis not present

## 2012-02-13 DIAGNOSIS — M549 Dorsalgia, unspecified: Secondary | ICD-10-CM | POA: Diagnosis not present

## 2012-02-13 DIAGNOSIS — H269 Unspecified cataract: Secondary | ICD-10-CM | POA: Diagnosis not present

## 2012-02-13 DIAGNOSIS — H2510 Age-related nuclear cataract, unspecified eye: Secondary | ICD-10-CM | POA: Diagnosis not present

## 2012-02-13 DIAGNOSIS — I10 Essential (primary) hypertension: Secondary | ICD-10-CM | POA: Insufficient documentation

## 2012-02-13 HISTORY — PX: CATARACT EXTRACTION W/PHACO: SHX586

## 2012-02-13 SURGERY — PHACOEMULSIFICATION, CATARACT, WITH IOL INSERTION
Anesthesia: Monitor Anesthesia Care | Site: Eye | Laterality: Left | Wound class: Clean Contaminated

## 2012-02-13 MED ORDER — SODIUM CHLORIDE 0.9 % IV SOLN
INTRAVENOUS | Status: DC | PRN
  Administered 2012-02-13: 15:00:00 via INTRAVENOUS

## 2012-02-13 MED ORDER — BACITRACIN-POLYMYXIN B 500-10000 UNIT/GM OP OINT
TOPICAL_OINTMENT | OPHTHALMIC | Status: DC | PRN
  Administered 2012-02-13: 1 via OPHTHALMIC

## 2012-02-13 MED ORDER — LIDOCAINE HCL (CARDIAC) 20 MG/ML IV SOLN
INTRAVENOUS | Status: DC | PRN
  Administered 2012-02-13: 30 mg via INTRAVENOUS

## 2012-02-13 MED ORDER — LIDOCAINE HCL (PF) 2 % IJ SOLN
INTRAMUSCULAR | Status: DC | PRN
  Administered 2012-02-13: 10 mL

## 2012-02-13 MED ORDER — CEFAZOLIN SUBCONJUNCTIVAL INJECTION 100 MG/0.5 ML
INJECTION | SUBCONJUNCTIVAL | Status: DC | PRN
  Administered 2012-02-13: 200 mg via SUBCONJUNCTIVAL

## 2012-02-13 MED ORDER — CEFAZOLIN SUBCONJUNCTIVAL INJECTION 100 MG/0.5 ML
200.0000 mg | INJECTION | SUBCONJUNCTIVAL | Status: DC
Start: 2012-02-13 — End: 2012-02-13
  Filled 2012-02-13: qty 1

## 2012-02-13 MED ORDER — BSS IO SOLN
INTRAOCULAR | Status: DC | PRN
  Administered 2012-02-13: 15 mL via INTRAOCULAR

## 2012-02-13 MED ORDER — NA CHONDROIT SULF-NA HYALURON 40-30 MG/ML IO SOLN
INTRAOCULAR | Status: DC | PRN
  Administered 2012-02-13: 0.5 mL via INTRAOCULAR

## 2012-02-13 MED ORDER — EPINEPHRINE HCL 1 MG/ML IJ SOLN
INTRAOCULAR | Status: DC | PRN
  Administered 2012-02-13: 15:00:00

## 2012-02-13 MED ORDER — DEXAMETHASONE SODIUM PHOSPHATE 10 MG/ML IJ SOLN
INTRAMUSCULAR | Status: DC | PRN
  Administered 2012-02-13: 10 mg

## 2012-02-13 MED ORDER — PROPOFOL 10 MG/ML IV BOLUS
INTRAVENOUS | Status: DC | PRN
  Administered 2012-02-13: 40 mg via INTRAVENOUS

## 2012-02-13 MED ORDER — MIDAZOLAM HCL 5 MG/5ML IJ SOLN
INTRAMUSCULAR | Status: DC | PRN
  Administered 2012-02-13 (×2): 1 mg via INTRAVENOUS

## 2012-02-13 MED ORDER — BUPIVACAINE HCL (PF) 0.75 % IJ SOLN
INTRAMUSCULAR | Status: DC | PRN
  Administered 2012-02-13: 10 mL

## 2012-02-13 MED ORDER — PROVISC 10 MG/ML IO SOLN
INTRAOCULAR | Status: DC | PRN
  Administered 2012-02-13: .85 mL via INTRAOCULAR

## 2012-02-13 MED ORDER — HYPROMELLOSE (GONIOSCOPIC) 2.5 % OP SOLN
OPHTHALMIC | Status: DC | PRN
  Administered 2012-02-13: 2 [drp] via OPHTHALMIC

## 2012-02-13 SURGICAL SUPPLY — 58 items
APPLICATOR COTTON TIP 6IN STRL (MISCELLANEOUS) ×2 IMPLANT
APPLICATOR DR MATTHEWS STRL (MISCELLANEOUS) ×2 IMPLANT
BAG MINI COLL DRAIN (WOUND CARE) ×2 IMPLANT
BLADE EYE MINI 60D BEAVER (BLADE) IMPLANT
BLADE KERATOME 2.75 (BLADE) ×2 IMPLANT
BLADE MVR KNIFE 19G (BLADE) ×2 IMPLANT
BLADE STAB KNIFE 15DEG (BLADE) IMPLANT
CANNULA ANTERIOR CHAMBER 27GA (MISCELLANEOUS) IMPLANT
CLOTH BEACON ORANGE TIMEOUT ST (SAFETY) ×2 IMPLANT
DRAPE OPHTHALMIC 77X100 STRL (CUSTOM PROCEDURE TRAY) ×2 IMPLANT
DRAPE POUCH INSTRU U-SHP 10X18 (DRAPES) ×2 IMPLANT
DRSG TEGADERM 4X4.75 (GAUZE/BANDAGES/DRESSINGS) ×2 IMPLANT
FILTER BLUE MILLIPORE (MISCELLANEOUS) IMPLANT
GLOVE SS BIOGEL STRL SZ 6.5 (GLOVE) ×2 IMPLANT
GLOVE SUPERSENSE BIOGEL SZ 6.5 (GLOVE) ×2
GOWN SRG XL XLNG 56XLVL 4 (GOWN DISPOSABLE) ×1 IMPLANT
GOWN STRL NON-REIN LRG LVL3 (GOWN DISPOSABLE) ×2 IMPLANT
GOWN STRL NON-REIN XL XLG LVL4 (GOWN DISPOSABLE) ×1
ILLUMINATOR WIDEFIELD DIFF (MISCELLANEOUS) ×2 IMPLANT
Intraocular Lens ×2 IMPLANT
KIT BASIN OR (CUSTOM PROCEDURE TRAY) ×2 IMPLANT
KIT ROOM TURNOVER OR (KITS) IMPLANT
KNIFE GRIESHABER SHARP 2.5MM (MISCELLANEOUS) ×2 IMPLANT
MASK EYE SHIELD (GAUZE/BANDAGES/DRESSINGS) ×2 IMPLANT
NEEDLE 18GX1X1/2 (RX/OR ONLY) (NEEDLE) IMPLANT
NEEDLE 22X1 1/2 (OR ONLY) (NEEDLE) ×2 IMPLANT
NEEDLE 25GX 5/8IN NON SAFETY (NEEDLE) ×2 IMPLANT
NEEDLE FILTER BLUNT 18X 1/2SAF (NEEDLE)
NEEDLE FILTER BLUNT 18X1 1/2 (NEEDLE) IMPLANT
NEEDLE HYPO 30X.5 LL (NEEDLE) ×4 IMPLANT
NS IRRIG 1000ML POUR BTL (IV SOLUTION) ×2 IMPLANT
PACK CATARACT CUSTOM (CUSTOM PROCEDURE TRAY) ×2 IMPLANT
PACK CATARACT MCHSCP (PACKS) ×2 IMPLANT
PACK COMBINED CATERACT/VIT 23G (OPHTHALMIC RELATED) ×2 IMPLANT
PAD ARMBOARD 7.5X6 YLW CONV (MISCELLANEOUS) ×4 IMPLANT
PAD EYE OVAL STERILE LF (GAUZE/BANDAGES/DRESSINGS) ×2 IMPLANT
PHACO TIP KELMAN 45DEG (TIP) ×2 IMPLANT
PROBE ANTERIOR 20G W/INFUS NDL (MISCELLANEOUS) IMPLANT
RING MALYGIN (MISCELLANEOUS) IMPLANT
ROLLS DENTAL (MISCELLANEOUS) IMPLANT
SHUTTLE MONARCH TYPE A (NEEDLE) ×2 IMPLANT
SOLUTION ANTI FOG 6CC (MISCELLANEOUS) ×2 IMPLANT
SPEAR EYE SURG WECK-CEL (MISCELLANEOUS) ×2 IMPLANT
SUT ETHILON 10-0 CS-B-6CS-B-6 (SUTURE) ×2
SUT ETHILON 5 0 P 3 18 (SUTURE)
SUT ETHILON 9 0 TG140 8 (SUTURE) IMPLANT
SUT NYLON ETHILON 5-0 P-3 1X18 (SUTURE) IMPLANT
SUT PLAIN 6 0 TG1408 (SUTURE) ×2 IMPLANT
SUT POLY NON ABSORB 10-0 8 STR (SUTURE) IMPLANT
SUT VICRYL 6 0 S 29 12 (SUTURE) IMPLANT
SUTURE EHLN 10-0 CS-B-6CS-B-6 (SUTURE) ×1 IMPLANT
SYR 20CC LL (SYRINGE) IMPLANT
SYR 5ML LL (SYRINGE) IMPLANT
SYR TB 1ML LUER SLIP (SYRINGE) IMPLANT
SYRINGE 10CC LL (SYRINGE) IMPLANT
TOWEL OR 17X24 6PK STRL BLUE (TOWEL DISPOSABLE) ×4 IMPLANT
WATER STERILE IRR 1000ML POUR (IV SOLUTION) ×2 IMPLANT
WIPE INSTRUMENT VISIWIPE 73X73 (MISCELLANEOUS) ×2 IMPLANT

## 2012-02-13 NOTE — H&P (View-Only) (Signed)
Pre-operative History and Physical for Ophthalmic Surgery  EEAN MCCARRICK 01/21/2012                  Chief Complaint: Decreased vision Left Eye  Diagnosis:  Combined Cataract   Allergies  Allergen Reactions  . Benazepril Hcl     REACTION: cough  . Ezetimibe-Simvastatin     REACTION: aches    Prior to Admission medications   Medication Sig Start Date End Date Taking? Authorizing Provider  amLODipine (NORVASC) 5 MG tablet Take 1 tablet (5 mg total) by mouth daily. 01/11/12   Georgina Quint Plotnikov, MD  aspirin 325 MG tablet Take 325 mg by mouth daily.      Historical Provider, MD  Cholecalciferol (VITAMIN D3) 1000 UNITS CAPS Take 1 capsule by mouth daily.      Historical Provider, MD  clopidogrel (PLAVIX) 75 MG tablet Take 1 tablet (75 mg total) by mouth daily. 01/11/12   Georgina Quint Plotnikov, MD  ezetimibe (ZETIA) 10 MG tablet Take 1 tablet (10 mg total) by mouth daily. 01/11/12   Tresa Garter, MD  isosorbide mononitrate (IMDUR) 60 MG 24 hr tablet Take 1 tablet (60 mg total) by mouth daily. 01/11/12   Georgina Quint Plotnikov, MD  losartan (COZAAR) 100 MG tablet Take 1 tablet (100 mg total) by mouth daily. 04/02/11 04/01/12  Georgina Quint Plotnikov, MD  metoprolol-hydrochlorothiazide (LOPRESSOR HCT) 100-25 MG per tablet Take 1 tablet by mouth daily. 01/11/12   Georgina Quint Plotnikov, MD  nitroGLYCERIN (NITROSTAT) 0.4 MG SL tablet Place 0.4 mg under the tongue every 5 (five) minutes as needed.      Historical Provider, MD  rosuvastatin (CRESTOR) 40 MG tablet Take 1 tablet (40 mg total) by mouth daily. 01/11/12   Georgina Quint Plotnikov, MD  travoprost, benzalkonium, (TRAVATAN) 0.004 % ophthalmic solution 1 drop at bedtime.      Historical Provider, MD    Planned Procedure:                                       Phacoemulsification, Posterior Chamber Intra-ocular Lens Left Eye                                       Acrysof MA50BM  + 11. 50 Diopter for implant OS   There were no vitals filed for this  visit.  Pulse: 70         Temp: NE        Resp:  18      ROS: negative apart from vision symtoms  Past Medical History  Diagnosis Date  . Coronary artery disease   . Hypertension   . Peripheral vascular disease   . Carotid artery disease   . Hyperlipidemia   . Glucose intolerance (impaired glucose tolerance)   . DJD (degenerative joint disease)     right hip  . Low back pain     facet arthropathy  . Hx of colonic polyps 2004    hyperplastic  . Glaucoma(365)   . Herpes zoster 2009    r. scalp and opth  . Postherpetic neuralgia 2009  . Pneumonia 2009    rll with a small effusion    Past Surgical History  Procedure Date  . Coronary artery disease     stent 2010  . Carpal  tunnel release     left  . Thumb tendon surgery     left  . Tonsillectomy   . Acne cyst removal 1970's    removed x2 off the back  . Coronary artery bypass graft 2002     History   Social History  . Marital Status: Divorced    Spouse Name: N/A    Number of Children: 3  . Years of Education: N/A   Occupational History  .     Social History Main Topics  . Smoking status: Former Games developer  . Smokeless tobacco: Not on file   Comment: quit by the time he was 78 yrs old  . Alcohol Use: No  . Drug Use: No  . Sexually Active: Not Currently   Other Topics Concern  . Not on file   Social History Narrative  . No narrative on file     The following examination is for anesthesia clearance for minimally invasive Ophthalmic surgery. It is primarily to document heart and lung findings and is not intended to elucidate unknown general medical conditions inclusive of abdominal masses, lung lesions, etc.   General Constitution:  WNL   Alertness/Orientation:  Person, time place     yes   HEENT:  Eye Findings:  Combined Cataract OS                   left eye  Neck: supple without masses  Chest/Lungs: clear to auscultation  Cardiac: Normal S1 and S2 without Murmur, S3 or S4  Neuro:  non-focal  Impression:  Combined Cataract OS   Planned Procedure:  Phacoemulsification, Posterior Chamber Intraocular Lens OS    Shade Flood, MD

## 2012-02-13 NOTE — Anesthesia Preprocedure Evaluation (Signed)
Anesthesia Evaluation  Patient identified by MRN, date of birth, ID band Patient awake    Reviewed: Allergy & Precautions, H&P , NPO status , Patient's Chart, lab work & pertinent test results, reviewed documented beta blocker date and time   Airway Mallampati: I TM Distance: >3 FB Neck ROM: Full    Dental  (+) Teeth Intact and Dental Advisory Given   Pulmonary  breath sounds clear to auscultation        Cardiovascular hypertension, Pt. on medications + CAD Rhythm:Regular Rate:Normal     Neuro/Psych    GI/Hepatic   Endo/Other    Renal/GU      Musculoskeletal   Abdominal   Peds  Hematology   Anesthesia Other Findings   Reproductive/Obstetrics                           Anesthesia Physical Anesthesia Plan  ASA: III  Anesthesia Plan: MAC   Post-op Pain Management:    Induction: Intravenous  Airway Management Planned: Simple Face Mask  Additional Equipment:   Intra-op Plan:   Post-operative Plan:   Informed Consent: I have reviewed the patients History and Physical, chart, labs and discussed the procedure including the risks, benefits and alternatives for the proposed anesthesia with the patient or authorized representative who has indicated his/her understanding and acceptance.   Dental advisory given  Plan Discussed with: CRNA, Anesthesiologist and Surgeon  Anesthesia Plan Comments:         Anesthesia Quick Evaluation

## 2012-02-13 NOTE — Preoperative (Signed)
Beta Blockers   Reason not to administer Beta Blockers:Not Applicable 

## 2012-02-13 NOTE — Interval H&P Note (Signed)
History and Physical Interval Note:  02/13/2012 12:36 PM  [name] [name]  has presented today for surgery, with the diagnosis of Combined Cataract Left Eye  The various methods of treatment have been discussed with the patient and family. After consideration of risks, benefits and other options for treatment, the patient has consented to  Procedure(s) (LRB) with comments: CATARACT EXTRACTION PHACO AND INTRAOCULAR LENS PLACEMENT (IOC) (Left) as a surgical intervention .  The patient's history has been reviewed, patient examined, no change in status, stable for surgery.  I have reviewed the patient's chart and labs.  Questions were answered to the patient's satisfaction.     Shade Flood, MD

## 2012-02-13 NOTE — Transfer of Care (Signed)
Immediate Anesthesia Transfer of Care Note  Patient: [name] [name]  Procedure(s) Performed: Procedure(s) (LRB) with comments: CATARACT EXTRACTION PHACO AND INTRAOCULAR LENS PLACEMENT (IOC) (Left)  Patient Location: PACU  Anesthesia Type:MAC  Level of Consciousness: awake, alert  and oriented  Airway & Oxygen Therapy: Patient Spontanous Breathing  Post-op Assessment: Report given to PACU RN  Post vital signs: Reviewed and stable  Complications: No apparent anesthesia complications

## 2012-02-13 NOTE — Anesthesia Postprocedure Evaluation (Signed)
  Anesthesia Post-op Note  Patient: [name] [name]  Procedure(s) Performed: Procedure(s) (LRB) with comments: CATARACT EXTRACTION PHACO AND INTRAOCULAR LENS PLACEMENT (IOC) (Left)  Patient Location: Short Stay  Anesthesia Type:MAC  Level of Consciousness: awake, alert  and oriented  Airway and Oxygen Therapy: Patient Spontanous Breathing  Post-op Pain: none  Post-op Assessment: Post-op Vital signs reviewed  Post-op Vital Signs: Reviewed and stable  Complications: No apparent anesthesia complications

## 2012-02-14 ENCOUNTER — Encounter (HOSPITAL_COMMUNITY): Payer: Self-pay | Admitting: Ophthalmology

## 2012-02-14 NOTE — Op Note (Signed)
Laurice Record 02/14/2012 Cataract: Combined, Nuclear  Procedure: Phacoemulsification, Posterior Chamber Intra-ocular Lens Operative Eye:  left eye  Surgeon: Shade Flood Estimated Blood Loss: minimal Specimens for Pathology:  None Complications: Radial capsule tear with dislocated Cataract ragments  The patient was prepared and draped in the usual manner for ocular surgery on the left eye. A Cook lid speculum was placed. A peripheral clear corneal incision was made at the surgical limbus centered at the 11:00 meridian. A separate clear corneal stab incision was made with a 15 degree blade at the 2:00 meridian to permit bi-manual technique. Viscoat and  Provisc as an underlying layer next to the capsule was instilled into the anterior chamber through that incision.  A keratome was used to create a self sealing incision entering the anterior chamber at the 11:00 meridian. A capsulorhexis was performed using a bent 25g needle. The lens was carefully  hydrodissected and the nucleus was hydrodilineated using a Nichammin cannula. The Chang chopper was carefully inserted and used to rotate the lens to insure adequate lens mobility. The phacoemulsification handpiece was inserted and a combined phaco-chop technique was employed, fracturing the lens into separate sections. The radial tear extended posteriorly and the lens fragments dropped posteriorly.  I deepened the anterior chamber with provisc and placed a folded Acrysof MA50BM +  The I/A cannula was used to remove remaining lens cortex. Provisc was instilled and used to deepen the anterior chamber and posterior capsule bag. The Monarch injector was used to place a folded Acrysof MA50BM PC IOL, + 11.50  diopters, into the sulcus. There was not vitreous present in the anterior chamber. A Sinskey lens hook was used to dial in the trailing haptic.The I/A cannula was used to remove the viscoelastic from the anterior chamber. BSS was used to bring IOP to the  desired range and the wound was checked to insure it was watertight. The wound was sealed with a 10-0 nylon suture.  I then converted to a 23g vitrectomy with one 20g sclerotomy at 9:30. A trocar cannula was used with conjunctival displacement at the 4:30 meridian. The infusion line was allowed to run to clear any bubbles in the line. The line was then clamped and placed into the cannula. The tip of the cannula was visualized in the vitreous cavity. The infusion was turned on and there were no anterior chamber wound leaks. Trocar cannulas were placed using conjunctival displacement at 2:30 and the trocar was removed. A conjunctival peritomy was made at the 9:30 meridian. The sclera was cleaned and cauterized. An MVR blade was used to make a sclerotomy at the 9:30 meridian, 3.5 mm posterior to the surgical limbus.  The light pipe and vitreous cutter were inserted. The retina was intact with no hemorrhages or retinal breaks. The lens fragments rested over the macula. The macula was within normal limits. Core vitrectomy was carried out with the vitreous removed up to the vitreous base for 360 degrees. The fragmatome was inserted and the lens fragments were removed without complications. The vitreous cutter was again inserted and used to remove the remaining smaller fragments. The cutter was also used on aspiration mode to vacuum miniscule fragments from the vitreous cavity.  The instruments were then withdrawn. The sclerotomy was closed with a 7-0 Vicryl suture and the conjunctiva was closed with a 6-0 Vicryl suture. The cannula at 2:30 was removed with concomitant tamponade with a cotton tipped applicator. The infusion cannula was removed in similar fashion. The anterior chamber temporal incision  was use to evacuate fluid to insure the eye pressure was below 20.   Subconjunctival injections of Ancef 100/0.31ml and Dexamethasone 0.5 ml of a 10mg /24ml solution were placed without complication. The lid speculum  and drapes were removed and the patient's eye was patched with Polymixin/Bacitracin ophthalmic ointment. An eye shield was placed and the patient was transferred alert and conversant from the operating room to the post-operative recovery area.   Shade Flood, MD

## 2012-03-07 DIAGNOSIS — H27129 Anterior dislocation of lens, unspecified eye: Secondary | ICD-10-CM | POA: Diagnosis not present

## 2012-03-10 ENCOUNTER — Other Ambulatory Visit: Payer: Self-pay | Admitting: Ophthalmology

## 2012-03-12 ENCOUNTER — Encounter (HOSPITAL_COMMUNITY): Payer: Self-pay

## 2012-03-12 ENCOUNTER — Encounter (HOSPITAL_COMMUNITY): Payer: Self-pay | Admitting: Pharmacy Technician

## 2012-03-12 ENCOUNTER — Encounter (HOSPITAL_COMMUNITY)
Admission: RE | Admit: 2012-03-12 | Discharge: 2012-03-12 | Disposition: A | Discharge status: Home / Self Care | Payer: Medicare Other | Source: Ambulatory Visit | Attending: Ophthalmology | Admitting: Ophthalmology

## 2012-03-12 DIAGNOSIS — Z9861 Coronary angioplasty status: Secondary | ICD-10-CM | POA: Diagnosis not present

## 2012-03-12 DIAGNOSIS — M169 Osteoarthritis of hip, unspecified: Secondary | ICD-10-CM | POA: Diagnosis not present

## 2012-03-12 DIAGNOSIS — K219 Gastro-esophageal reflux disease without esophagitis: Secondary | ICD-10-CM | POA: Diagnosis not present

## 2012-03-12 DIAGNOSIS — Z87891 Personal history of nicotine dependence: Secondary | ICD-10-CM | POA: Diagnosis not present

## 2012-03-12 DIAGNOSIS — T8529XA Other mechanical complication of intraocular lens, initial encounter: Secondary | ICD-10-CM | POA: Diagnosis not present

## 2012-03-12 DIAGNOSIS — H21549 Posterior synechiae (iris), unspecified eye: Secondary | ICD-10-CM | POA: Diagnosis not present

## 2012-03-12 DIAGNOSIS — E785 Hyperlipidemia, unspecified: Secondary | ICD-10-CM | POA: Diagnosis not present

## 2012-03-12 DIAGNOSIS — Z86010 Personal history of colonic polyps: Secondary | ICD-10-CM | POA: Diagnosis not present

## 2012-03-12 DIAGNOSIS — H27129 Anterior dislocation of lens, unspecified eye: Secondary | ICD-10-CM | POA: Diagnosis not present

## 2012-03-12 DIAGNOSIS — Z01812 Encounter for preprocedural laboratory examination: Secondary | ICD-10-CM | POA: Diagnosis not present

## 2012-03-12 DIAGNOSIS — I2510 Atherosclerotic heart disease of native coronary artery without angina pectoris: Secondary | ICD-10-CM | POA: Diagnosis not present

## 2012-03-12 DIAGNOSIS — I10 Essential (primary) hypertension: Secondary | ICD-10-CM | POA: Diagnosis not present

## 2012-03-12 DIAGNOSIS — Z951 Presence of aortocoronary bypass graft: Secondary | ICD-10-CM | POA: Diagnosis not present

## 2012-03-12 HISTORY — DX: Gastro-esophageal reflux disease without esophagitis: K21.9

## 2012-03-12 HISTORY — DX: Headache: R51

## 2012-03-12 LAB — BASIC METABOLIC PANEL
BUN: 22 mg/dL (ref 6–23)
Calcium: 9.8 mg/dL (ref 8.4–10.5)
GFR calc non Af Amer: 63 mL/min — ABNORMAL LOW (ref >90)
Glucose, Bld: 110 mg/dL — ABNORMAL HIGH (ref 70–99)
Potassium: 4 mEq/L (ref 3.5–5.1)

## 2012-03-12 LAB — CBC
Hemoglobin: 14.3 g/dL (ref 13.0–17.0)
MCH: 30 pg (ref 26.0–34.0)
MCHC: 35.1 g/dL (ref 30.0–36.0)

## 2012-03-12 NOTE — Progress Notes (Signed)
See Allison's note from 02/06/12 regarding patients cardiac history for upcoming surgery 03/14/12

## 2012-03-12 NOTE — Pre-Procedure Instructions (Signed)
[name]  03/12/2012   Your procedure is scheduled on:  Friday March 14, 2012  Report to [facility] Short Stay Center at 5:30 AM.  Call this number if you have problems the morning of surgery: [phone]   Remember:   Do not eat food or drink :After Midnight.    Take these medicines the morning of surgery with A SIP OF WATER: amlodipine, isosorbide, metoprolol, nitrostat ( if needed )   Do not wear jewelry, make-up or nail polish.  Do not wear lotions, powders, or perfumes.  Do not shave 48 hours prior to surgery. Men may shave face and neck.  Do not bring valuables to the hospital.  Contacts, dentures or bridgework may not be worn into surgery.  Leave suitcase in the car. After surgery it may be brought to your room.  For patients admitted to the hospital, checkout time is 11:00 AM the day of discharge.   Patients discharged the day of surgery will not be allowed to drive home.    Special Instructions: Shower using CHG 2 nights before surgery and the night before surgery.  If you shower the day of surgery use CHG.  Use special wash - you have one bottle of CHG for all showers.  You should use approximately 1/3 of the bottle for each shower.   Please read over the following fact sheets that you were given: Pain Booklet, Coughing and Deep Breathing and Surgical Site Infection Prevention

## 2012-03-13 MED ORDER — PREDNISOLONE ACETATE 1 % OP SUSP
1.0000 [drp] | OPHTHALMIC | Status: AC
  Administered 2012-03-14: 1 [drp] via OPHTHALMIC
  Filled 2012-03-13: qty 5

## 2012-03-13 MED ORDER — GATIFLOXACIN 0.5 % OP SOLN
1.0000 [drp] | OPHTHALMIC | Status: AC | PRN
  Administered 2012-03-14 (×3): 1 [drp] via OPHTHALMIC
  Filled 2012-03-13: qty 2.5

## 2012-03-13 MED ORDER — TETRACAINE HCL 0.5 % OP SOLN
2.0000 [drp] | OPHTHALMIC | Status: AC
  Administered 2012-03-14: 2 [drp] via OPHTHALMIC
  Filled 2012-03-13: qty 2

## 2012-03-14 ENCOUNTER — Encounter (HOSPITAL_COMMUNITY): Payer: Self-pay | Admitting: Critical Care Medicine

## 2012-03-14 ENCOUNTER — Encounter (HOSPITAL_COMMUNITY)
Admission: RE | Disposition: A | Discharge status: Home / Self Care | Payer: Self-pay | Source: Ambulatory Visit | Attending: Ophthalmology

## 2012-03-14 ENCOUNTER — Ambulatory Visit (HOSPITAL_COMMUNITY): Payer: Medicare Other | Admitting: Critical Care Medicine

## 2012-03-14 ENCOUNTER — Ambulatory Visit (HOSPITAL_COMMUNITY)
Admission: RE | Admit: 2012-03-14 | Discharge: 2012-03-14 | Disposition: A | Discharge status: Home / Self Care | Payer: Medicare Other | Source: Ambulatory Visit | Attending: Ophthalmology | Admitting: Ophthalmology

## 2012-03-14 DIAGNOSIS — Z01812 Encounter for preprocedural laboratory examination: Secondary | ICD-10-CM | POA: Insufficient documentation

## 2012-03-14 DIAGNOSIS — K219 Gastro-esophageal reflux disease without esophagitis: Secondary | ICD-10-CM | POA: Diagnosis not present

## 2012-03-14 DIAGNOSIS — M169 Osteoarthritis of hip, unspecified: Secondary | ICD-10-CM | POA: Diagnosis not present

## 2012-03-14 DIAGNOSIS — Z9861 Coronary angioplasty status: Secondary | ICD-10-CM | POA: Insufficient documentation

## 2012-03-14 DIAGNOSIS — I2510 Atherosclerotic heart disease of native coronary artery without angina pectoris: Secondary | ICD-10-CM | POA: Insufficient documentation

## 2012-03-14 DIAGNOSIS — H21549 Posterior synechiae (iris), unspecified eye: Secondary | ICD-10-CM | POA: Insufficient documentation

## 2012-03-14 DIAGNOSIS — Z860100 Personal history of colon polyps, unspecified: Secondary | ICD-10-CM | POA: Insufficient documentation

## 2012-03-14 DIAGNOSIS — Y831 Surgical operation with implant of artificial internal device as the cause of abnormal reaction of the patient, or of later complication, without mention of misadventure at the time of the procedure: Secondary | ICD-10-CM | POA: Insufficient documentation

## 2012-03-14 DIAGNOSIS — H27129 Anterior dislocation of lens, unspecified eye: Secondary | ICD-10-CM | POA: Diagnosis not present

## 2012-03-14 DIAGNOSIS — E785 Hyperlipidemia, unspecified: Secondary | ICD-10-CM | POA: Insufficient documentation

## 2012-03-14 DIAGNOSIS — T8529XA Other mechanical complication of intraocular lens, initial encounter: Secondary | ICD-10-CM | POA: Insufficient documentation

## 2012-03-14 DIAGNOSIS — Z87891 Personal history of nicotine dependence: Secondary | ICD-10-CM | POA: Insufficient documentation

## 2012-03-14 DIAGNOSIS — M1610 Unilateral primary osteoarthritis, unspecified hip: Secondary | ICD-10-CM | POA: Insufficient documentation

## 2012-03-14 DIAGNOSIS — I10 Essential (primary) hypertension: Secondary | ICD-10-CM | POA: Insufficient documentation

## 2012-03-14 DIAGNOSIS — Z951 Presence of aortocoronary bypass graft: Secondary | ICD-10-CM | POA: Insufficient documentation

## 2012-03-14 HISTORY — PX: REPOSITION OF LENS: SHX6069

## 2012-03-14 SURGERY — REPOSITIONING, IOL
Anesthesia: Monitor Anesthesia Care | Site: Eye | Laterality: Left | Wound class: Clean

## 2012-03-14 MED ORDER — BUPIVACAINE HCL (PF) 0.75 % IJ SOLN
INTRAMUSCULAR | Status: AC
  Filled 2012-03-14: qty 10

## 2012-03-14 MED ORDER — FENTANYL CITRATE 0.05 MG/ML IJ SOLN
INTRAMUSCULAR | Status: DC | PRN
  Administered 2012-03-14: 50 ug via INTRAVENOUS

## 2012-03-14 MED ORDER — ACETYLCHOLINE CHLORIDE 1:100 IO SOLR
INTRAOCULAR | Status: AC
  Filled 2012-03-14: qty 1

## 2012-03-14 MED ORDER — ACETAZOLAMIDE SODIUM 500 MG IJ SOLR
INTRAMUSCULAR | Status: AC
  Filled 2012-03-14: qty 500

## 2012-03-14 MED ORDER — TRIAMCINOLONE ACETONIDE 40 MG/ML IJ SUSP
INTRAMUSCULAR | Status: AC
  Filled 2012-03-14: qty 1

## 2012-03-14 MED ORDER — CEFAZOLIN SUBCONJUNCTIVAL INJECTION 100 MG/0.5 ML
200.0000 mg | INJECTION | Freq: Once | SUBCONJUNCTIVAL | Status: DC
  Filled 2012-03-14: qty 1

## 2012-03-14 MED ORDER — EPINEPHRINE HCL 1 MG/ML IJ SOLN
INTRAOCULAR | Status: DC | PRN
  Administered 2012-03-14: 07:00:00

## 2012-03-14 MED ORDER — LIDOCAINE HCL 2 % IJ SOLN
INTRAMUSCULAR | Status: AC
  Filled 2012-03-14: qty 20

## 2012-03-14 MED ORDER — LIDOCAINE HCL (PF) 2 % IJ SOLN
INTRAMUSCULAR | Status: DC | PRN
  Administered 2012-03-14: 4 mL

## 2012-03-14 MED ORDER — CEFAZOLIN SUBCONJUNCTIVAL INJECTION 100 MG/0.5 ML
INJECTION | SUBCONJUNCTIVAL | Status: DC | PRN
  Administered 2012-03-14: 100 mg via SUBCONJUNCTIVAL

## 2012-03-14 MED ORDER — ONDANSETRON HCL 4 MG/2ML IJ SOLN
INTRAMUSCULAR | Status: DC | PRN
  Administered 2012-03-14: 4 mg via INTRAVENOUS

## 2012-03-14 MED ORDER — SODIUM HYALURONATE 10 MG/ML IO SOLN
INTRAOCULAR | Status: AC
  Filled 2012-03-14: qty 0.85

## 2012-03-14 MED ORDER — DEXAMETHASONE SODIUM PHOSPHATE 10 MG/ML IJ SOLN
INTRAMUSCULAR | Status: DC | PRN
  Administered 2012-03-14: 10 mg via INTRAVENOUS

## 2012-03-14 MED ORDER — DEXAMETHASONE SODIUM PHOSPHATE 10 MG/ML IJ SOLN
INTRAMUSCULAR | Status: AC
  Filled 2012-03-14: qty 1

## 2012-03-14 MED ORDER — BUPIVACAINE HCL (PF) 0.75 % IJ SOLN
INTRAMUSCULAR | Status: DC | PRN
  Administered 2012-03-14: 4 mL

## 2012-03-14 MED ORDER — BSS IO SOLN
INTRAOCULAR | Status: AC
  Filled 2012-03-14: qty 500

## 2012-03-14 MED ORDER — NA CHONDROIT SULF-NA HYALURON 40-30 MG/ML IO SOLN
INTRAOCULAR | Status: AC
  Filled 2012-03-14: qty 0.5

## 2012-03-14 MED ORDER — BACITRACIN-POLYMYXIN B 500-10000 UNIT/GM OP OINT
TOPICAL_OINTMENT | OPHTHALMIC | Status: AC
  Filled 2012-03-14: qty 3.5

## 2012-03-14 MED ORDER — PILOCARPINE HCL 2 % OP SOLN
1.0000 [drp] | Freq: Four times a day (QID) | OPHTHALMIC | Status: DC
  Filled 2012-03-14: qty 15

## 2012-03-14 MED ORDER — SODIUM CHLORIDE 0.9 % IR SOLN
Status: DC | PRN
  Administered 2012-03-14: 1

## 2012-03-14 MED ORDER — PROVISC 10 MG/ML IO SOLN
INTRAOCULAR | Status: DC | PRN
  Administered 2012-03-14: .85 mL via INTRAOCULAR

## 2012-03-14 MED ORDER — WATER FOR IRRIGATION, STERILE IR SOLN
Status: DC | PRN
  Administered 2012-03-14: 1000 mL

## 2012-03-14 MED ORDER — TETRACAINE HCL 0.5 % OP SOLN
OPHTHALMIC | Status: DC | PRN
  Administered 2012-03-14: 2 [drp] via OPHTHALMIC

## 2012-03-14 MED ORDER — LIDOCAINE HCL (CARDIAC) 20 MG/ML IV SOLN
INTRAVENOUS | Status: DC | PRN
  Administered 2012-03-14: 30 mg via INTRAVENOUS

## 2012-03-14 MED ORDER — EPINEPHRINE HCL 1 MG/ML IJ SOLN
INTRAMUSCULAR | Status: AC
  Filled 2012-03-14: qty 1

## 2012-03-14 MED ORDER — HYPROMELLOSE (GONIOSCOPIC) 2.5 % OP SOLN
OPHTHALMIC | Status: AC
  Filled 2012-03-14: qty 15

## 2012-03-14 MED ORDER — TETRACAINE HCL 0.5 % OP SOLN
OPHTHALMIC | Status: AC
  Filled 2012-03-14: qty 2

## 2012-03-14 MED ORDER — PROPOFOL 10 MG/ML IV BOLUS
INTRAVENOUS | Status: DC | PRN
  Administered 2012-03-14: 10 mg via INTRAVENOUS
  Administered 2012-03-14: 30 mg via INTRAVENOUS

## 2012-03-14 MED ORDER — SODIUM CHLORIDE 0.9 % IV SOLN
INTRAVENOUS | Status: DC | PRN
  Administered 2012-03-14: 07:00:00 via INTRAVENOUS

## 2012-03-14 MED ORDER — NA CHONDROIT SULF-NA HYALURON 40-30 MG/ML IO SOLN
INTRAOCULAR | Status: DC | PRN
  Administered 2012-03-14: 0.5 mL via INTRAOCULAR

## 2012-03-14 MED ORDER — HYPROMELLOSE (GONIOSCOPIC) 2.5 % OP SOLN
OPHTHALMIC | Status: DC | PRN
  Administered 2012-03-14: 1 [drp] via OPHTHALMIC

## 2012-03-14 MED ORDER — BACITRACIN-POLYMYXIN B 500-10000 UNIT/GM OP OINT
TOPICAL_OINTMENT | OPHTHALMIC | Status: DC | PRN
  Administered 2012-03-14: 1 via OPHTHALMIC

## 2012-03-14 MED ORDER — ACETYLCHOLINE CHLORIDE 1:100 IO SOLR
INTRAOCULAR | Status: DC | PRN
  Administered 2012-03-14: 20 mg via INTRAOCULAR

## 2012-03-14 SURGICAL SUPPLY — 59 items
APPLICATOR COTTON TIP 6IN STRL (MISCELLANEOUS) ×2 IMPLANT
APPLICATOR DR MATTHEWS STRL (MISCELLANEOUS) ×2 IMPLANT
BAG MINI COLL DRAIN (WOUND CARE) ×2 IMPLANT
BLADE EYE MINI 60D BEAVER (BLADE) IMPLANT
BLADE KERATOME 2.75 (BLADE) ×2 IMPLANT
BLADE MVR KNIFE 20G (BLADE) ×2 IMPLANT
BLADE STAB KNIFE 15DEG (BLADE) IMPLANT
CANNULA ANTERIOR CHAMBER 27GA (MISCELLANEOUS) ×2 IMPLANT
CLOTH BEACON ORANGE TIMEOUT ST (SAFETY) ×2 IMPLANT
DRAPE OPHTHALMIC 77X100 STRL (CUSTOM PROCEDURE TRAY) ×2 IMPLANT
DRAPE POUCH INSTRU U-SHP 10X18 (DRAPES) ×2 IMPLANT
DRSG TEGADERM 4X4.75 (GAUZE/BANDAGES/DRESSINGS) ×2 IMPLANT
FILTER BLUE MILLIPORE (MISCELLANEOUS) IMPLANT
GLOVE SS BIOGEL STRL SZ 6.5 (GLOVE) ×1 IMPLANT
GLOVE SUPERSENSE BIOGEL SZ 6.5 (GLOVE) ×1
GLOVE SURG SS PI 6.5 STRL IVOR (GLOVE) ×2 IMPLANT
GOWN SRG XL XLNG 56XLVL 4 (GOWN DISPOSABLE) ×1 IMPLANT
GOWN STRL NON-REIN LRG LVL3 (GOWN DISPOSABLE) ×2 IMPLANT
GOWN STRL NON-REIN XL XLG LVL4 (GOWN DISPOSABLE) ×1
KIT BASIN OR (CUSTOM PROCEDURE TRAY) ×2 IMPLANT
KIT ROOM TURNOVER OR (KITS) IMPLANT
KNIFE GRIESHABER SHARP 2.5MM (MISCELLANEOUS) ×2 IMPLANT
MASK EYE SHIELD (GAUZE/BANDAGES/DRESSINGS) ×2 IMPLANT
NEEDLE 18GX1X1/2 (RX/OR ONLY) (NEEDLE) IMPLANT
NEEDLE 22X1 1/2 (OR ONLY) (NEEDLE) ×2 IMPLANT
NEEDLE 25GX 5/8IN NON SAFETY (NEEDLE) ×2 IMPLANT
NEEDLE FILTER BLUNT 18X 1/2SAF (NEEDLE)
NEEDLE FILTER BLUNT 18X1 1/2 (NEEDLE) IMPLANT
NEEDLE HYPO 30X.5 LL (NEEDLE) ×4 IMPLANT
NS IRRIG 1000ML POUR BTL (IV SOLUTION) ×2 IMPLANT
PACK CATARACT CUSTOM (CUSTOM PROCEDURE TRAY) ×2 IMPLANT
PACK CATARACT MCHSCP (PACKS) ×2 IMPLANT
PACK COMBINED CATERACT/VIT 23G (OPHTHALMIC RELATED) IMPLANT
PAD ARMBOARD 7.5X6 YLW CONV (MISCELLANEOUS) ×4 IMPLANT
PAD EYE OVAL STERILE LF (GAUZE/BANDAGES/DRESSINGS) ×2 IMPLANT
PHACO TIP KELMAN 45DEG (TIP) ×2 IMPLANT
PROBE ANTERIOR 20G W/INFUS NDL (MISCELLANEOUS) IMPLANT
RING MALYGIN (MISCELLANEOUS) IMPLANT
ROLLS DENTAL (MISCELLANEOUS) IMPLANT
SHUTTLE MONARCH TYPE A (NEEDLE) ×2 IMPLANT
SOLUTION ANTI FOG 6CC (MISCELLANEOUS) ×2 IMPLANT
SPEAR EYE SURG WECK-CEL (MISCELLANEOUS) ×2 IMPLANT
SUT ETHILON 10-0 CS-B-6CS-B-6 (SUTURE)
SUT ETHILON 5 0 P 3 18 (SUTURE)
SUT ETHILON 9 0 TG140 8 (SUTURE) IMPLANT
SUT NYLON ETHILON 5-0 P-3 1X18 (SUTURE) IMPLANT
SUT PLAIN 6 0 TG1408 (SUTURE) IMPLANT
SUT POLY NON ABSORB 10-0 8 STR (SUTURE) IMPLANT
SUT VICRYL 6 0 S 29 12 (SUTURE) IMPLANT
SUTURE EHLN 10-0 CS-B-6CS-B-6 (SUTURE) IMPLANT
SYR 20CC LL (SYRINGE) IMPLANT
SYR 5ML LL (SYRINGE) IMPLANT
SYR TB 1ML LUER SLIP (SYRINGE) IMPLANT
SYRINGE 10CC LL (SYRINGE) IMPLANT
TAPE SURG TRANSPORE 1 IN (GAUZE/BANDAGES/DRESSINGS) ×1 IMPLANT
TAPE SURGICAL TRANSPORE 1 IN (GAUZE/BANDAGES/DRESSINGS) ×1
TOWEL OR 17X24 6PK STRL BLUE (TOWEL DISPOSABLE) ×4 IMPLANT
WATER STERILE IRR 1000ML POUR (IV SOLUTION) ×2 IMPLANT
WIPE INSTRUMENT VISIWIPE 73X73 (MISCELLANEOUS) ×2 IMPLANT

## 2012-03-14 NOTE — Anesthesia Preprocedure Evaluation (Addendum)
Anesthesia Evaluation  Patient identified by MRN, date of birth, ID band Patient awake    Reviewed: Allergy & Precautions, H&P , NPO status , Patient's Chart, lab work & pertinent test results, reviewed documented beta blocker date and time   Airway Mallampati: I TM Distance: >3 FB Neck ROM: full    Dental  (+) Dental Advisory Given   Pulmonary former smoker,          Cardiovascular hypertension, Pt. on home beta blockers + CAD, + CABG and + Peripheral Vascular Disease Rhythm:regular Rate:Normal     Neuro/Psych  Headaches,  Neuromuscular disease    GI/Hepatic GERD-  ,  Endo/Other    Renal/GU      Musculoskeletal   Abdominal   Peds  Hematology   Anesthesia Other Findings   Reproductive/Obstetrics                          Anesthesia Physical Anesthesia Plan  ASA: III  Anesthesia Plan: MAC   Post-op Pain Management:    Induction: Intravenous  Airway Management Planned: Nasal Cannula  Additional Equipment:   Intra-op Plan:   Post-operative Plan:   Informed Consent: I have reviewed the patients History and Physical, chart, labs and discussed the procedure including the risks, benefits and alternatives for the proposed anesthesia with the patient or authorized representative who has indicated his/her understanding and acceptance.   Dental advisory given  Plan Discussed with: Anesthesiologist and Surgeon  Anesthesia Plan Comments:         Anesthesia Quick Evaluation

## 2012-03-14 NOTE — Addendum Note (Signed)
Addendum  created 03/14/12 0950 by Elon Alas, CRNA   Modules edited:Anesthesia Flowsheet, Anesthesia Medication Administration

## 2012-03-14 NOTE — Op Note (Signed)
ANIEL HUBBLE 03/14/2012 Anterior Dislocation of Posterior Chamber Intraocular Lens Posterior Synechia  Procedure: Anterior Vitrectomy, Synechialysis, Reposition PC IOL  Operative Eye:  left eye  Surgeon: Shade Flood Estimated Blood Loss: minimal Specimens for Pathology:  None Complications: none  The patient was prepared and draped in the usual manner for ocular surgery on the left eye. A Cook lid speculum was placed. A peripheral clear corneal stab incision was made at the surgical limbus centered at the 11:00 meridian. A separate clear corneal stab incision was made with a 15 degree blade at the 2:00 meridian to permit bi-manual technique. Viscoat was applied into the anterior chamber and beneath the iris next to the anterior capsule. This technique identified the posterior adhesion to the capsule which was at the 9:00 meridian.  An MVR blade was used to facilitate completely severing the adhesion form the capsule. It was associated with a small wick of vitreous. The anterior chamber maintainer was placed in the temporal clear corneal incision. The vitreous cutter was inserted in the incision at 11:00. It was used to remove the vitreous wick remnant which proceeded uneventfully.  The Sinskey lens hook was inserted and used to dial the PC IOL back into position. The haptics were placed at the 4:30-11:30 axis where the zonular support was intact.   Sub-conjunctival injection of 100 mg Ancef and 4 mg Dexamethasone were placed with direct visualization of the needle tip. The eye was patched with Polymyxin/Bacitracin Ophthalmic Ointment and a plastic shield was placed. He was transferred alert and conversant from the operating room to the post-operative recovery area.   Clement Husbands, MD

## 2012-03-14 NOTE — H&P (Signed)
Pre-operative History and Physical for Ophthalmic Surgery  [name] [name] 03/14/2012                  Diagnosis: Anterior dislocation of Intraocular Lens  Allergies  Allergen Reactions  . Benazepril Hcl     REACTION: cough  . Ezetimibe-Simvastatin     REACTION: aches    Prior to Admission medications   Medication Sig Start Date End Date Taking? Authorizing Provider  amLODipine (NORVASC) 5 MG tablet Take 5 mg by mouth daily. 01/11/12  Yes Georgina Quint Plotnikov, MD  ezetimibe (ZETIA) 10 MG tablet Take 10 mg by mouth daily. 01/11/12  Yes Georgina Quint Plotnikov, MD  gatifloxacin (ZYMAXID) 0.5 % SOLN Place 1 drop into the left eye 4 (four) times daily.   Yes Historical Provider, MD  isosorbide mononitrate (IMDUR) 60 MG 24 hr tablet Take 60 mg by mouth daily. 01/11/12  Yes Georgina Quint Plotnikov, MD  losartan (COZAAR) 100 MG tablet Take 100 mg by mouth daily. 04/02/11 04/01/12 Yes Georgina Quint Plotnikov, MD  metoprolol-hydrochlorothiazide (LOPRESSOR HCT) 100-25 MG per tablet Take 1 tablet by mouth daily. 01/11/12  Yes Georgina Quint Plotnikov, MD  nitroGLYCERIN (NITROSTAT) 0.4 MG SL tablet Place 0.4 mg under the tongue every 5 (five) minutes as needed. For chest pain   Yes Historical Provider, MD  Polyethyl Glycol-Propyl Glycol (SYSTANE OP) Place 1 drop into both eyes as needed. For dry eyes   Yes Historical Provider, MD  PrednisoLONE Sodium Phosphate (PREDNISOL OP) Place 1 drop into the left eye 4 (four) times daily.   Yes Historical Provider, MD  rosuvastatin (CRESTOR) 40 MG tablet Take 40 mg by mouth daily. 01/11/12  Yes Georgina Quint Plotnikov, MD  travoprost, benzalkonium, (TRAVATAN) 0.004 % ophthalmic solution Place 1 drop into both eyes at bedtime.    Yes Historical Provider, MD  aspirin 325 MG tablet Take 325 mg by mouth daily.     Historical Provider, MD  Brimonidine Tartrate (ALPHAGAN P OP) Place 1 drop into the left eye 2 (two) times daily.    Historical Provider, MD  Cholecalciferol (VITAMIN D3) 1000  UNITS CAPS Take 1 capsule by mouth daily.      Historical Provider, MD  clopidogrel (PLAVIX) 75 MG tablet Take 75 mg by mouth daily. 01/11/12   Tresa Garter, MD    Planned Procedure: Anterior vitrectomy, Synechialysis, Reposition PC IOL OS   Filed Vitals:   03/14/12 0557  BP: 146/66  Pulse: 59  Temp: 98 F (36.7 C)  Resp: 18    Past Medical History  Diagnosis Date  . Peripheral vascular disease   . Carotid artery disease   . Hyperlipidemia   . Glucose intolerance (impaired glucose tolerance)   . DJD (degenerative joint disease)     right hip  . Low back pain     facet arthropathy  . Hx of colonic polyps 2004    hyperplastic  . Glaucoma(365)   . Herpes zoster 2009    r. scalp and opth  . Postherpetic neuralgia 2009  . Pneumonia 2009    rll with a small effusion  . Lumbago   . Coronary artery disease     sees Dr. Daleen Squibb  . Hypertension     sees Dr. Cheron Schaumann  . GERD (gastroesophageal reflux disease)     hx of  . Headache     hx of migraines    Past Surgical History  Procedure Date  . Coronary artery disease     stent  2010  . Carpal tunnel release     left  . Thumb tendon surgery     left  . Acne cyst removal 1970's    removed x2 off the back  . Coronary artery bypass graft 2002  . Tonsillectomy     age 66  . Cataract extraction w/phaco 02/13/2012    Procedure: CATARACT EXTRACTION PHACO AND INTRAOCULAR LENS PLACEMENT (IOC);  Surgeon: Shade Flood, MD;  Location: [facility] OR;  Service: Ophthalmology;  Laterality: Left;     History   Social History  . Marital Status: Divorced    Spouse Name: N/A    Number of Children: 3  . Years of Education: N/A   Occupational History  .     Social History Main Topics  . Smoking status: Former Smoker -- 0.7 packs/day for 25 years  . Smokeless tobacco: Not on file     Comment: quit by the time he was 78 yrs old  . Alcohol Use: Yes     Comment: infrequent glass of wine  . Drug Use: No  . Sexually Active: Not  Currently   Other Topics Concern  . Not on file   Social History Narrative  . No narrative on file     The following examination is for anesthesia clearance for minimally invasive Ophthalmic surgery. It is primarily to document heart and lung findings and is not intended to elucidate unknown general medical conditions inclusive of abdominal masses, lung lesions, etc.   General Constitution:  within normal limits   Alertness/Orientation:  Person, time place     yes   HEENT:  Eye Findings: Anterior duislocation of PC IOL                   left eye  Neck: supple without masses  Chest/Lungs: clear to auscultation  Cardiac: Normal S1 and S2 without Murmur, S3 or S4  Neuro: non-focal   Impression: Anterior dislocation of PC IOL  Planned Procedure:  Anterior vitrectomy, synechialysis, reposition, PC IOL OS    Shade Flood, MD

## 2012-03-14 NOTE — Transfer of Care (Signed)
Immediate Anesthesia Transfer of Care Note  Patient: [name] [name]  Procedure(s) Performed: Procedure(s) (LRB) with comments: REPOSITION OF LENS (Left) - Reposition Ocular Lens Left Eye  Patient Location: PACU  Anesthesia Type:MAC  Level of Consciousness: awake  Airway & Oxygen Therapy: Patient Spontanous Breathing  Post-op Assessment: Report given to PACU RN  Post vital signs: Reviewed and stable  Complications: No apparent anesthesia complications

## 2012-03-14 NOTE — Anesthesia Procedure Notes (Signed)
Procedure Name: MAC Date/Time: 03/14/2012 7:44 AM Performed by: Elon Alas Pre-anesthesia Checklist: Patient identified, Timeout performed, Emergency Drugs available, Suction available and Patient being monitored Oxygen Delivery Method: Nasal cannula Placement Confirmation: positive ETCO2 and breath sounds checked- equal and bilateral Dental Injury: Teeth and Oropharynx as per pre-operative assessment

## 2012-03-14 NOTE — Addendum Note (Signed)
Addendum  created 03/14/12 0950 by Kindell Strada Brown Olyn Landstrom, CRNA   Modules edited:Anesthesia Flowsheet, Anesthesia Medication Administration    

## 2012-03-14 NOTE — Anesthesia Postprocedure Evaluation (Signed)
  Anesthesia Post-op Note  Patient: [name] [name]  Procedure(s) Performed: Procedure(s) (LRB) with comments: REPOSITION OF LENS (Left) - Reposition Ocular Lens Left Eye  Patient Location: Short Stay  Anesthesia Type:MAC  Level of Consciousness: awake, alert , oriented and patient cooperative  Airway and Oxygen Therapy: Patient Spontanous Breathing  Post-op Pain: none  Post-op Assessment: Post-op Vital signs reviewed, Patient's Cardiovascular Status Stable, Respiratory Function Stable, Patent Airway, No signs of Nausea or vomiting and Pain level controlled  Post-op Vital Signs: stable  Complications: No apparent anesthesia complications

## 2012-03-14 NOTE — Progress Notes (Signed)
Pt reports that he is out of Zymaxid gtts.  Dr. Anne Hahn notified and stated pt will need drops till she sees him tomorrow. Pt and daughter  notified.

## 2012-03-14 NOTE — Preoperative (Signed)
Beta Blockers   Reason not to administer Beta Blockers:Not Applicable, pt took 11/29

## 2012-03-17 ENCOUNTER — Encounter (HOSPITAL_COMMUNITY): Payer: Self-pay | Admitting: Ophthalmology

## 2012-04-11 ENCOUNTER — Encounter (INDEPENDENT_AMBULATORY_CARE_PROVIDER_SITE_OTHER): Payer: Medicare Other

## 2012-04-11 DIAGNOSIS — I6529 Occlusion and stenosis of unspecified carotid artery: Secondary | ICD-10-CM

## 2012-05-09 DIAGNOSIS — L608 Other nail disorders: Secondary | ICD-10-CM | POA: Diagnosis not present

## 2012-05-09 DIAGNOSIS — M79609 Pain in unspecified limb: Secondary | ICD-10-CM | POA: Diagnosis not present

## 2012-05-09 DIAGNOSIS — B351 Tinea unguium: Secondary | ICD-10-CM | POA: Diagnosis not present

## 2012-05-13 ENCOUNTER — Encounter: Payer: Self-pay | Admitting: Internal Medicine

## 2012-05-13 ENCOUNTER — Other Ambulatory Visit (INDEPENDENT_AMBULATORY_CARE_PROVIDER_SITE_OTHER): Payer: Medicare Other

## 2012-05-13 ENCOUNTER — Ambulatory Visit (INDEPENDENT_AMBULATORY_CARE_PROVIDER_SITE_OTHER): Payer: Medicare Other | Admitting: Internal Medicine

## 2012-05-13 VITALS — BP 140/72 | HR 80 | Temp 98.5°F | Resp 16 | Wt 190.0 lb

## 2012-05-13 DIAGNOSIS — E785 Hyperlipidemia, unspecified: Secondary | ICD-10-CM

## 2012-05-13 DIAGNOSIS — I10 Essential (primary) hypertension: Secondary | ICD-10-CM

## 2012-05-13 DIAGNOSIS — I2510 Atherosclerotic heart disease of native coronary artery without angina pectoris: Secondary | ICD-10-CM

## 2012-05-13 DIAGNOSIS — R7309 Other abnormal glucose: Secondary | ICD-10-CM

## 2012-05-13 DIAGNOSIS — M545 Low back pain, unspecified: Secondary | ICD-10-CM

## 2012-05-13 DIAGNOSIS — G5600 Carpal tunnel syndrome, unspecified upper limb: Secondary | ICD-10-CM | POA: Insufficient documentation

## 2012-05-13 DIAGNOSIS — I872 Venous insufficiency (chronic) (peripheral): Secondary | ICD-10-CM

## 2012-05-13 LAB — BASIC METABOLIC PANEL
BUN: 17 mg/dL (ref 6–23)
Creatinine, Ser: 1 mg/dL (ref 0.4–1.5)
GFR: 88.95 mL/min (ref >60.00)
Potassium: 3.9 mEq/L (ref 3.5–5.1)

## 2012-05-13 NOTE — Assessment & Plan Note (Signed)
Will inj under [place]

## 2012-05-13 NOTE — Assessment & Plan Note (Signed)
compr socks 

## 2012-05-13 NOTE — Assessment & Plan Note (Signed)
Continue with current prescription therapy as reflected on the Med list.  

## 2012-05-13 NOTE — Progress Notes (Signed)
Subjective:    Patient ID: [name] [name], male    DOB: Jul 29, 1932, 79 y.o.   MRN: [id number]  HPI  The patient presents for a follow-up of chronic dyslipidemia, CAD, elev glu controlled with medicines and HTN. He had chest pressure 5-6 d ago that was relieved w/NTG x2 BP is nl at home. C/o dark skin and swelling on legs L>R C/o R hand going to sleep a lot Wt Readings from Last 3 Encounters:  05/13/12 190 lb (86.183 kg)  03/12/12 193 lb 3.2 oz (87.635 kg)  02/06/12 193 lb 14.4 oz (87.952 kg)   BP Readings from Last 3 Encounters:  05/13/12 140/72  03/14/12 123/57  03/14/12 123/57      Review of Systems  Constitutional: Negative for appetite change, fatigue and unexpected weight change.  HENT: Negative for nosebleeds, congestion, sore throat, sneezing, trouble swallowing and neck pain.   Eyes: Negative for itching and visual disturbance.  Respiratory: Negative for cough.   Cardiovascular: Negative for chest pain, palpitations and leg swelling.  Gastrointestinal: Negative for nausea, diarrhea, blood in stool and abdominal distention.  Genitourinary: Negative for frequency and hematuria.  Musculoskeletal: Negative for back pain, joint swelling and gait problem.  Skin: Negative for rash.  Neurological: Negative for dizziness, tremors, speech difficulty and weakness.  Psychiatric/Behavioral: Negative for sleep disturbance, dysphoric mood and agitation. The patient is not nervous/anxious.        Objective:   Physical Exam  Constitutional: He is oriented to person, place, and time. He appears well-developed.       Obese   HENT:  Mouth/Throat: Oropharynx is clear and moist.  Eyes: Conjunctivae normal are normal. Pupils are equal, round, and reactive to light.  Neck: Normal range of motion. No JVD present. No thyromegaly present.  Cardiovascular: Normal rate, regular rhythm and intact distal pulses.  Exam reveals no gallop and no friction rub.   Murmur (2-3/6 sys)  heard. Pulmonary/Chest: Effort normal and breath sounds normal. No respiratory distress. He has no wheezes. He has no rales. He exhibits no tenderness.  Abdominal: Soft. Bowel sounds are normal. He exhibits no distension and no mass. There is no tenderness. There is no rebound and no guarding.  Musculoskeletal: Normal range of motion. He exhibits no edema and no tenderness.  Lymphadenopathy:    He has no cervical adenopathy.  Neurological: He is alert and oriented to person, place, and time. He has normal reflexes. No cranial nerve deficit. He exhibits normal muscle tone. Coordination normal.  Skin: Skin is warm and dry. No rash noted.  Psychiatric: He has a normal mood and affect. His behavior is normal. Judgment and thought content normal.  B shins are brownish L>R Trace edema CTS signs pos on R  Lab Results  Component Value Date   WBC 6.8 03/12/2012   HGB 14.3 03/12/2012   HCT 40.7 03/12/2012   PLT 198 03/12/2012   GLUCOSE 107* 05/13/2012   CHOL 191 01/10/2012   TRIG 92.0 01/10/2012   HDL 57.10 01/10/2012   LDLDIRECT 136.2 09/28/2009   LDLCALC 116* 01/10/2012   ALT 31 01/10/2012   AST 32 01/10/2012   NA 142 05/13/2012   K 3.9 05/13/2012   CL 108 05/13/2012   CREATININE 1.0 05/13/2012   BUN 17 05/13/2012   CO2 27 05/13/2012   TSH 1.49 10/08/2011   PSA 0.41 07/02/2011   INR 1.1 ratio* 10/06/2009   HGBA1C 5.7 01/10/2012         Assessment & Plan:

## 2012-05-21 ENCOUNTER — Ambulatory Visit (INDEPENDENT_AMBULATORY_CARE_PROVIDER_SITE_OTHER): Payer: Medicare Other | Admitting: Internal Medicine

## 2012-05-21 ENCOUNTER — Encounter: Payer: Self-pay | Admitting: Internal Medicine

## 2012-05-21 VITALS — BP 172/60 | HR 60 | Temp 98.7°F | Resp 16 | Wt 194.0 lb

## 2012-05-21 DIAGNOSIS — G5600 Carpal tunnel syndrome, unspecified upper limb: Secondary | ICD-10-CM

## 2012-05-21 MED ORDER — METHYLPREDNISOLONE ACETATE 80 MG/ML IJ SUSP: 40.0000 mg | Freq: Once | INTRAMUSCULAR | Status: DC

## 2012-05-21 NOTE — Assessment & Plan Note (Signed)
See procedure Splint

## 2012-05-21 NOTE — Patient Instructions (Signed)
Post-procedure instructions: keep hand elevated for 10-15 min. Rest for 1-3 days. Call if pain, redness or swelling would develop.

## 2012-05-21 NOTE — Progress Notes (Signed)
   Procedure Note :  Carpal tunnel steroid injection - US guided   Indication:  CTS with refractory  chronic pain and numbness.   Risks including unsuccessful procedure , bleeding, infection, bruising, skin atrophy and others were explained to the patient in detail as well as the benefits. Informed consent was obtained and signed.   Tthe patient was placed in a comfortable position. Lateral  approach was used. Skin was prepped with Betadine and alcohol. Under a guidance of HFL38x/13-6 MHz transducer linear probe in a sterile cover (transverse placement) and with a use of a sterile [place] gel, a 25 gauge 1.5" needle on a 3 cc syringe was used to enter the skin medial to the ulnar artery. Median nerve area was approached with the needle. Then 1 mL of 2% lidocaine and 40 mg of Depo-Medrol was used injected around the nerve.  Band-Aid was applied.   Tolerated well. Complications: None, except for hand numbness from the anesthetic. Good pain relief following the procedure. ACE wrap and wrist splint were applied.  Post-procedure instructions: keep hand elevated for 10-15 min. Rest for 1-3 days. Call if pain, redness or swelling would develop.

## 2012-06-02 ENCOUNTER — Encounter: Payer: Self-pay | Admitting: Internal Medicine

## 2012-07-08 ENCOUNTER — Other Ambulatory Visit: Payer: Self-pay | Admitting: Internal Medicine

## 2012-07-18 ENCOUNTER — Ambulatory Visit (INDEPENDENT_AMBULATORY_CARE_PROVIDER_SITE_OTHER): Payer: Medicare Other | Admitting: Internal Medicine

## 2012-07-18 ENCOUNTER — Encounter: Payer: Self-pay | Admitting: Internal Medicine

## 2012-07-18 VITALS — BP 130/78 | HR 76 | Temp 98.4°F | Resp 16 | Wt 190.0 lb

## 2012-07-18 DIAGNOSIS — R635 Abnormal weight gain: Secondary | ICD-10-CM | POA: Diagnosis not present

## 2012-07-18 DIAGNOSIS — G5600 Carpal tunnel syndrome, unspecified upper limb: Secondary | ICD-10-CM

## 2012-07-18 DIAGNOSIS — I10 Essential (primary) hypertension: Secondary | ICD-10-CM | POA: Diagnosis not present

## 2012-07-18 DIAGNOSIS — E785 Hyperlipidemia, unspecified: Secondary | ICD-10-CM | POA: Diagnosis not present

## 2012-07-18 DIAGNOSIS — R739 Hyperglycemia, unspecified: Secondary | ICD-10-CM

## 2012-07-18 DIAGNOSIS — G5601 Carpal tunnel syndrome, right upper limb: Secondary | ICD-10-CM

## 2012-07-18 DIAGNOSIS — R7309 Other abnormal glucose: Secondary | ICD-10-CM

## 2012-07-18 NOTE — Assessment & Plan Note (Signed)
Wt Readings from Last 3 Encounters:  07/18/12 190 lb (86.183 kg)  05/21/12 194 lb (87.998 kg)  05/13/12 190 lb (86.183 kg)

## 2012-07-18 NOTE — Assessment & Plan Note (Signed)
Better s/p steroid injection

## 2012-07-18 NOTE — Assessment & Plan Note (Signed)
Continue with current prescription therapy as reflected on the Med list.  

## 2012-07-18 NOTE — Progress Notes (Signed)
   Subjective:     HPI  The patient presents for a follow-up of chronic dyslipidemia, CAD, elev glu controlled with medicines and HTN. He had chest pressure 5-6 d ago that was relieved w/NTG x2 BP is nl at home. C/o dark skin and swelling on legs L>R F/u R hand CTS - shot helped some   Wt Readings from Last 3 Encounters:  07/18/12 190 lb (86.183 kg)  05/21/12 194 lb (87.998 kg)  05/13/12 190 lb (86.183 kg)   BP Readings from Last 3 Encounters:  07/18/12 130/78  05/21/12 172/60  05/13/12 140/72      Review of Systems  Constitutional: Negative for appetite change, fatigue and unexpected weight change.  HENT: Negative for nosebleeds, congestion, sore throat, sneezing, trouble swallowing and neck pain.   Eyes: Negative for itching and visual disturbance.  Respiratory: Negative for cough.   Cardiovascular: Negative for chest pain, palpitations and leg swelling.  Gastrointestinal: Negative for nausea, diarrhea, blood in stool and abdominal distention.  Genitourinary: Negative for frequency and hematuria.  Musculoskeletal: Negative for back pain, joint swelling and gait problem.  Skin: Negative for rash.  Neurological: Negative for dizziness, tremors, speech difficulty and weakness.  Psychiatric/Behavioral: Negative for sleep disturbance, dysphoric mood and agitation. The patient is not nervous/anxious.        Objective:   Physical Exam  Constitutional: He is oriented to person, place, and time. He appears well-developed.  Obese   HENT:  Mouth/Throat: Oropharynx is clear and moist.  Eyes: Conjunctivae are normal. Pupils are equal, round, and reactive to light.  Neck: Normal range of motion. No JVD present. No thyromegaly present.  Cardiovascular: Normal rate, regular rhythm and intact distal pulses.  Exam reveals no gallop and no friction rub.   Murmur (2-3/6 sys) heard. Pulmonary/Chest: Effort normal and breath sounds normal. No respiratory distress. He has no wheezes. He  has no rales. He exhibits no tenderness.  Abdominal: Soft. Bowel sounds are normal. He exhibits no distension and no mass. There is no tenderness. There is no rebound and no guarding.  Musculoskeletal: Normal range of motion. He exhibits no edema and no tenderness.  Lymphadenopathy:    He has no cervical adenopathy.  Neurological: He is alert and oriented to person, place, and time. He has normal reflexes. No cranial nerve deficit. He exhibits normal muscle tone. Coordination normal.  Skin: Skin is warm and dry. No rash noted.  Psychiatric: He has a normal mood and affect. His behavior is normal. Judgment and thought content normal.  B shins are brownish L>R Trace edema CTS signs neg on R,L  Lab Results  Component Value Date   WBC 6.8 03/12/2012   HGB 14.3 03/12/2012   HCT 40.7 03/12/2012   PLT 198 03/12/2012   GLUCOSE 107* 05/13/2012   CHOL 191 01/10/2012   TRIG 92.0 01/10/2012   HDL 57.10 01/10/2012   LDLDIRECT 136.2 09/28/2009   LDLCALC 116* 01/10/2012   ALT 31 01/10/2012   AST 32 01/10/2012   NA 142 05/13/2012   K 3.9 05/13/2012   CL 108 05/13/2012   CREATININE 1.0 05/13/2012   BUN 17 05/13/2012   CO2 27 05/13/2012   TSH 1.49 10/08/2011   PSA 0.41 07/02/2011   INR 1.1 ratio* 10/06/2009   HGBA1C 5.7 01/10/2012         Assessment & Plan:

## 2012-07-24 DIAGNOSIS — H4011X0 Primary open-angle glaucoma, stage unspecified: Secondary | ICD-10-CM | POA: Diagnosis not present

## 2012-07-24 DIAGNOSIS — Z961 Presence of intraocular lens: Secondary | ICD-10-CM | POA: Diagnosis not present

## 2012-08-12 ENCOUNTER — Encounter: Payer: Self-pay | Admitting: Podiatry

## 2012-08-12 ENCOUNTER — Ambulatory Visit (INDEPENDENT_AMBULATORY_CARE_PROVIDER_SITE_OTHER): Payer: Medicare Other | Admitting: Podiatry

## 2012-08-12 VITALS — BP 158/66 | HR 58 | Ht 67.0 in | Wt 190.0 lb

## 2012-08-12 DIAGNOSIS — M25579 Pain in unspecified ankle and joints of unspecified foot: Secondary | ICD-10-CM

## 2012-08-12 DIAGNOSIS — B351 Tinea unguium: Secondary | ICD-10-CM

## 2012-08-12 NOTE — Progress Notes (Signed)
S:  Problem with toe nail fungus on left foot.  They are painful. O:  Pedal pulses not palpable, bilateral. Thick mycotic nails x 9 (2nd right missing). Hallux valgus with bunion on left. All epicritic and tactile sensations grossly intact. A:  Mycotic nails x 9. PVD. Venous insufficiency. P:  All nails debrided.

## 2012-08-14 DIAGNOSIS — H2510 Age-related nuclear cataract, unspecified eye: Secondary | ICD-10-CM | POA: Diagnosis not present

## 2012-08-19 ENCOUNTER — Encounter: Payer: Self-pay | Admitting: Internal Medicine

## 2012-08-19 ENCOUNTER — Ambulatory Visit (INDEPENDENT_AMBULATORY_CARE_PROVIDER_SITE_OTHER): Payer: Medicare Other | Admitting: Internal Medicine

## 2012-08-19 VITALS — BP 130/70 | HR 60 | Temp 98.0°F | Resp 16 | Wt 187.8 lb

## 2012-08-19 DIAGNOSIS — H6590 Unspecified nonsuppurative otitis media, unspecified ear: Secondary | ICD-10-CM | POA: Diagnosis not present

## 2012-08-19 DIAGNOSIS — J309 Allergic rhinitis, unspecified: Secondary | ICD-10-CM

## 2012-08-19 MED ORDER — AMOXICILLIN 875 MG PO TABS: 875.0000 mg | ORAL_TABLET | Freq: Two times a day (BID) | ORAL | Status: DC

## 2012-08-19 MED ORDER — METHYLPREDNISOLONE ACETATE 80 MG/ML IJ SUSP
80.0000 mg | Freq: Once | INTRAMUSCULAR | Status: AC
  Administered 2012-08-19: 80 mg via INTRAMUSCULAR

## 2012-08-19 MED ORDER — METHYLPREDNISOLONE ACETATE 40 MG/ML IJ SUSP
40.0000 mg | Freq: Once | INTRAMUSCULAR | Status: AC
  Administered 2012-08-19: 40 mg via INTRAMUSCULAR

## 2012-08-19 MED ORDER — FLUTICASONE PROPIONATE 50 MCG/ACT NA SUSP: 2.0000 | Freq: Every day | NASAL | Status: DC

## 2012-08-19 NOTE — Progress Notes (Signed)
Subjective:    Patient ID: [name] [name], male    DOB: April 15, 1933, 79 y.o.   MRN: [id number]  Allergic Reaction This is a new problem. The current episode started 5 to 7 days ago. The problem occurs constantly. The problem has been gradually worsening since onset. The problem is moderate. Associated with: pollen. Associated symptoms include eye itching and eye watering. Pertinent negatives include no abdominal pain, chest pain, chest pressure, coughing, diarrhea, difficulty breathing, drooling, eye redness, globus sensation, hyperventilation, itching, rash, stridor, trouble swallowing, vomiting or wheezing. There is no swelling present. Past treatments include nothing. His past medical history is significant for seasonal allergies. There is no history of food allergies.      Review of Systems  Constitutional: Negative.   HENT: Positive for ear pain (left). Negative for hearing loss, facial swelling, drooling, trouble swallowing, neck pain, neck stiffness and ear discharge.   Eyes: Positive for itching. Negative for photophobia, pain, discharge, redness and visual disturbance.  Respiratory: Negative.  Negative for cough, wheezing and stridor.   Cardiovascular: Negative.  Negative for chest pain and leg swelling.  Gastrointestinal: Negative.  Negative for vomiting, abdominal pain and diarrhea.  Endocrine: Negative.   Genitourinary: Negative.   Musculoskeletal: Negative.   Skin: Negative.  Negative for color change, itching, pallor, rash and wound.  Allergic/Immunologic: Positive for environmental allergies. Negative for food allergies and immunocompromised state.  Neurological: Positive for dizziness. Negative for tremors, seizures, syncope, facial asymmetry, speech difficulty, weakness, light-headedness, numbness and headaches.  Hematological: Negative for adenopathy. Does not bruise/bleed easily.  Psychiatric/Behavioral: Negative.        Objective:   Physical Exam  Vitals  reviewed. Constitutional: He is oriented to person, place, and time. He appears well-developed and well-nourished.  Non-toxic appearance. He does not have a sickly appearance. He does not appear ill. No distress.  HENT:  Head: Normocephalic. No trismus in the jaw.  Right Ear: Hearing, tympanic membrane, external ear and ear canal normal.  Left Ear: Hearing, external ear and ear canal normal. Tympanic membrane is not injected, not scarred, not perforated, not erythematous, not retracted and not bulging. Tympanic membrane mobility is normal. A middle ear effusion (serous) is present.  Nose: Mucosal edema and rhinorrhea present. No nose lacerations, sinus tenderness, nasal deformity, septal deviation or nasal septal hematoma. No epistaxis.  No foreign bodies. Right sinus exhibits no maxillary sinus tenderness and no frontal sinus tenderness. Left sinus exhibits no maxillary sinus tenderness and no frontal sinus tenderness.  Mouth/Throat: Oropharynx is clear and moist. Mucous membranes are not pale, not dry and not cyanotic. No oral lesions. No edematous. No tonsillar abscesses.  Eyes: Conjunctivae are normal. Right eye exhibits no discharge. Left eye exhibits no discharge. No scleral icterus.  Neck: Normal range of motion. Neck supple. No JVD present. No tracheal deviation present. No thyromegaly present.  Cardiovascular: Normal rate, regular rhythm, normal heart sounds and intact distal pulses.  Exam reveals no gallop and no friction rub.   No murmur heard. Pulmonary/Chest: Effort normal and breath sounds normal. No stridor. No respiratory distress. He has no wheezes. He has no rales. He exhibits no tenderness.  Abdominal: Soft. Bowel sounds are normal. He exhibits no distension and no mass. There is no tenderness. There is no rebound and no guarding.  Musculoskeletal: Normal range of motion. He exhibits no edema and no tenderness.  Lymphadenopathy:    He has no cervical adenopathy.  Neurological: He  is oriented to person, place, and time.  Skin:  Skin is warm and dry. No rash noted. He is not diaphoretic. No erythema. No pallor.  Psychiatric: He has a normal mood and affect. His behavior is normal. Judgment and thought content normal.          Assessment & Plan:

## 2012-08-19 NOTE — Assessment & Plan Note (Signed)
I have treated the allergic part with depo-medrol IM, he will also start flonase ns There may be some infection so will start amoxil

## 2012-08-19 NOTE — Patient Instructions (Signed)

## 2012-08-19 NOTE — Assessment & Plan Note (Signed)
Will treat with depo-medrol IM and flonase ns

## 2012-09-09 ENCOUNTER — Other Ambulatory Visit (INDEPENDENT_AMBULATORY_CARE_PROVIDER_SITE_OTHER): Payer: Medicare Other

## 2012-09-09 DIAGNOSIS — G5601 Carpal tunnel syndrome, right upper limb: Secondary | ICD-10-CM

## 2012-09-09 DIAGNOSIS — R7309 Other abnormal glucose: Secondary | ICD-10-CM

## 2012-09-09 DIAGNOSIS — G5600 Carpal tunnel syndrome, unspecified upper limb: Secondary | ICD-10-CM

## 2012-09-09 DIAGNOSIS — R739 Hyperglycemia, unspecified: Secondary | ICD-10-CM

## 2012-09-09 DIAGNOSIS — I10 Essential (primary) hypertension: Secondary | ICD-10-CM | POA: Diagnosis not present

## 2012-09-09 DIAGNOSIS — R635 Abnormal weight gain: Secondary | ICD-10-CM

## 2012-09-09 DIAGNOSIS — E785 Hyperlipidemia, unspecified: Secondary | ICD-10-CM

## 2012-09-09 LAB — LIPID PANEL: Total CHOL/HDL Ratio: 3

## 2012-09-09 LAB — BASIC METABOLIC PANEL
CO2: 24 mEq/L (ref 19–32)
Chloride: 104 mEq/L (ref 96–112)
Potassium: 3.9 mEq/L (ref 3.5–5.1)
Sodium: 139 mEq/L (ref 135–145)

## 2012-09-09 LAB — HEMOGLOBIN A1C: Hgb A1c MFr Bld: 5.7 % (ref 4.6–6.5)

## 2012-09-10 ENCOUNTER — Ambulatory Visit (INDEPENDENT_AMBULATORY_CARE_PROVIDER_SITE_OTHER): Payer: Medicare Other | Admitting: Internal Medicine

## 2012-09-10 ENCOUNTER — Encounter: Payer: Self-pay | Admitting: Internal Medicine

## 2012-09-10 VITALS — BP 140/70 | HR 76 | Temp 98.4°F | Resp 16 | Wt 182.0 lb

## 2012-09-10 DIAGNOSIS — I2510 Atherosclerotic heart disease of native coronary artery without angina pectoris: Secondary | ICD-10-CM

## 2012-09-10 DIAGNOSIS — E785 Hyperlipidemia, unspecified: Secondary | ICD-10-CM | POA: Diagnosis not present

## 2012-09-10 DIAGNOSIS — H6590 Unspecified nonsuppurative otitis media, unspecified ear: Secondary | ICD-10-CM | POA: Diagnosis not present

## 2012-09-10 DIAGNOSIS — I10 Essential (primary) hypertension: Secondary | ICD-10-CM | POA: Diagnosis not present

## 2012-09-10 DIAGNOSIS — G5600 Carpal tunnel syndrome, unspecified upper limb: Secondary | ICD-10-CM | POA: Diagnosis not present

## 2012-09-10 DIAGNOSIS — R7309 Other abnormal glucose: Secondary | ICD-10-CM

## 2012-09-10 DIAGNOSIS — G5601 Carpal tunnel syndrome, right upper limb: Secondary | ICD-10-CM

## 2012-09-10 NOTE — Assessment & Plan Note (Signed)
Resolved

## 2012-09-10 NOTE — Assessment & Plan Note (Signed)
Continue with current prescription therapy as reflected on the Med list.  

## 2012-09-10 NOTE — Assessment & Plan Note (Signed)
Better  

## 2012-09-10 NOTE — Progress Notes (Signed)
   Subjective:     HPI  F/u vertigo - resolved The patient presents for a follow-up of chronic dyslipidemia, CAD, elev glu controlled with medicines and HTN. F/u R hand CTS - shot helped some   Wt Readings from Last 3 Encounters:  09/10/12 182 lb (82.555 kg)  08/19/12 187 lb 12 oz (85.163 kg)  08/12/12 190 lb (86.183 kg)   BP Readings from Last 3 Encounters:  09/10/12 140/70  08/19/12 130/70  08/12/12 158/66      Review of Systems  Constitutional: Negative for appetite change, fatigue and unexpected weight change.  HENT: Negative for nosebleeds, congestion, sore throat, sneezing, trouble swallowing and neck pain.   Eyes: Negative for itching and visual disturbance.  Respiratory: Negative for cough.   Cardiovascular: Negative for chest pain, palpitations and leg swelling.  Gastrointestinal: Negative for nausea, diarrhea, blood in stool and abdominal distention.  Genitourinary: Negative for frequency and hematuria.  Musculoskeletal: Negative for back pain, joint swelling and gait problem.  Skin: Negative for rash.  Neurological: Negative for dizziness, tremors, speech difficulty and weakness.  Psychiatric/Behavioral: Negative for sleep disturbance, dysphoric mood and agitation. The patient is not nervous/anxious.        Objective:   Physical Exam  Constitutional: He is oriented to person, place, and time. He appears well-developed.  Obese   HENT:  Mouth/Throat: Oropharynx is clear and moist.  Eyes: Conjunctivae are normal. Pupils are equal, round, and reactive to light.  Neck: Normal range of motion. No JVD present. No thyromegaly present.  Cardiovascular: Normal rate, regular rhythm and intact distal pulses.  Exam reveals no gallop and no friction rub.   Murmur (2-3/6 sys) heard. Pulmonary/Chest: Effort normal and breath sounds normal. No respiratory distress. He has no wheezes. He has no rales. He exhibits no tenderness.  Abdominal: Soft. Bowel sounds are normal. He  exhibits no distension and no mass. There is no tenderness. There is no rebound and no guarding.  Musculoskeletal: Normal range of motion. He exhibits no edema and no tenderness.  Lymphadenopathy:    He has no cervical adenopathy.  Neurological: He is alert and oriented to person, place, and time. He has normal reflexes. No cranial nerve deficit. He exhibits normal muscle tone. Coordination normal.  Skin: Skin is warm and dry. No rash noted.  Psychiatric: He has a normal mood and affect. His behavior is normal. Judgment and thought content normal.  B shins are brownish L>R Trace edema CTS signs neg on R,L  Lab Results  Component Value Date   WBC 6.8 03/12/2012   HGB 14.3 03/12/2012   HCT 40.7 03/12/2012   PLT 198 03/12/2012   GLUCOSE 90 09/09/2012   CHOL 173 09/09/2012   TRIG 77.0 09/09/2012   HDL 60.60 09/09/2012   LDLDIRECT 136.2 09/28/2009   LDLCALC 97 09/09/2012   ALT 31 01/10/2012   AST 32 01/10/2012   NA 139 09/09/2012   K 3.9 09/09/2012   CL 104 09/09/2012   CREATININE 1.2 09/09/2012   BUN 19 09/09/2012   CO2 24 09/09/2012   TSH 1.49 10/08/2011   PSA 0.41 07/02/2011   INR 1.1 ratio* 10/06/2009   HGBA1C 5.7 09/09/2012         Assessment & Plan:

## 2012-09-10 NOTE — Assessment & Plan Note (Signed)
Watching  

## 2012-09-15 DIAGNOSIS — H4011X0 Primary open-angle glaucoma, stage unspecified: Secondary | ICD-10-CM | POA: Diagnosis not present

## 2012-10-13 ENCOUNTER — Encounter (INDEPENDENT_AMBULATORY_CARE_PROVIDER_SITE_OTHER): Payer: Medicare Other

## 2012-10-13 DIAGNOSIS — I6529 Occlusion and stenosis of unspecified carotid artery: Secondary | ICD-10-CM

## 2012-10-16 DIAGNOSIS — H4011X0 Primary open-angle glaucoma, stage unspecified: Secondary | ICD-10-CM | POA: Diagnosis not present

## 2012-10-16 DIAGNOSIS — H04129 Dry eye syndrome of unspecified lacrimal gland: Secondary | ICD-10-CM | POA: Diagnosis not present

## 2012-11-11 ENCOUNTER — Encounter: Payer: Self-pay | Admitting: Podiatry

## 2012-11-11 ENCOUNTER — Ambulatory Visit (INDEPENDENT_AMBULATORY_CARE_PROVIDER_SITE_OTHER): Payer: Medicare Other | Admitting: Podiatry

## 2012-11-11 VITALS — BP 124/61 | HR 60 | Ht 67.0 in | Wt 185.0 lb

## 2012-11-11 DIAGNOSIS — I739 Peripheral vascular disease, unspecified: Secondary | ICD-10-CM

## 2012-11-11 DIAGNOSIS — B351 Tinea unguium: Secondary | ICD-10-CM

## 2012-11-11 DIAGNOSIS — M79609 Pain in unspecified limb: Secondary | ICD-10-CM

## 2012-11-11 NOTE — Progress Notes (Signed)
Subjective: 79 y.o. year old male patient presents complaining of painful nails. Patient requests toe nails trimmed.   Objective: Pedal pulses not palpable, bilateral.  Thick mycotic nails x 9 (2nd right missing).  Hallux valgus with bunion on left.  All epicritic and tactile sensations grossly intact.  No acute skin lesions noted.  Assessment:  Mycotic nails x 9.  PVD.  Venous insufficiency.   Plan:  All nails debrided.

## 2012-12-10 ENCOUNTER — Ambulatory Visit (INDEPENDENT_AMBULATORY_CARE_PROVIDER_SITE_OTHER): Payer: Medicare Other | Admitting: Internal Medicine

## 2012-12-10 ENCOUNTER — Encounter: Payer: Self-pay | Admitting: Internal Medicine

## 2012-12-10 ENCOUNTER — Other Ambulatory Visit (INDEPENDENT_AMBULATORY_CARE_PROVIDER_SITE_OTHER): Payer: Medicare Other

## 2012-12-10 VITALS — BP 146/72 | HR 72 | Temp 99.8°F | Resp 16 | Wt 185.0 lb

## 2012-12-10 DIAGNOSIS — R739 Hyperglycemia, unspecified: Secondary | ICD-10-CM

## 2012-12-10 DIAGNOSIS — I2510 Atherosclerotic heart disease of native coronary artery without angina pectoris: Secondary | ICD-10-CM

## 2012-12-10 DIAGNOSIS — I10 Essential (primary) hypertension: Secondary | ICD-10-CM | POA: Diagnosis not present

## 2012-12-10 DIAGNOSIS — R7309 Other abnormal glucose: Secondary | ICD-10-CM | POA: Diagnosis not present

## 2012-12-10 DIAGNOSIS — R634 Abnormal weight loss: Secondary | ICD-10-CM

## 2012-12-10 DIAGNOSIS — M545 Low back pain, unspecified: Secondary | ICD-10-CM

## 2012-12-10 DIAGNOSIS — E785 Hyperlipidemia, unspecified: Secondary | ICD-10-CM

## 2012-12-10 LAB — BASIC METABOLIC PANEL
CO2: 26 mEq/L (ref 19–32)
Calcium: 9.3 mg/dL (ref 8.4–10.5)
Creatinine, Ser: 1.2 mg/dL (ref 0.4–1.5)
GFR: 77.53 mL/min (ref >60.00)
Glucose, Bld: 96 mg/dL (ref 70–99)
Sodium: 139 mEq/L (ref 135–145)

## 2012-12-10 NOTE — Progress Notes (Signed)
   Subjective:     HPI   The patient presents for a follow-up of chronic dyslipidemia, CAD, elev glu controlled with medicines and HTN. F/u R hand CTS - shot helped some   Wt Readings from Last 3 Encounters:  12/10/12 185 lb (83.915 kg)  11/11/12 185 lb (83.915 kg)  09/10/12 182 lb (82.555 kg)   BP Readings from Last 3 Encounters:  12/10/12 146/72  11/11/12 124/61  09/10/12 140/70      Review of Systems  Constitutional: Negative for appetite change, fatigue and unexpected weight change.  HENT: Negative for nosebleeds, congestion, sore throat, sneezing, trouble swallowing and neck pain.   Eyes: Negative for itching and visual disturbance.  Respiratory: Negative for cough.   Cardiovascular: Negative for chest pain, palpitations and leg swelling.  Gastrointestinal: Negative for nausea, diarrhea, blood in stool and abdominal distention.  Genitourinary: Negative for frequency and hematuria.  Musculoskeletal: Negative for back pain, joint swelling and gait problem.  Skin: Negative for rash.  Neurological: Negative for dizziness, tremors, speech difficulty and weakness.  Psychiatric/Behavioral: Negative for sleep disturbance, dysphoric mood and agitation. The patient is not nervous/anxious.        Objective:   Physical Exam  Constitutional: He is oriented to person, place, and time. He appears well-developed.  Obese   HENT:  Mouth/Throat: Oropharynx is clear and moist.  Eyes: Conjunctivae are normal. Pupils are equal, round, and reactive to light.  Neck: Normal range of motion. No JVD present. No thyromegaly present.  Cardiovascular: Normal rate, regular rhythm and intact distal pulses.  Exam reveals no gallop and no friction rub.   Murmur (2-3/6 sys) heard. Pulmonary/Chest: Effort normal and breath sounds normal. No respiratory distress. He has no wheezes. He has no rales. He exhibits no tenderness.  Abdominal: Soft. Bowel sounds are normal. He exhibits no distension and  no mass. There is no tenderness. There is no rebound and no guarding.  Musculoskeletal: Normal range of motion. He exhibits no edema and no tenderness.  Lymphadenopathy:    He has no cervical adenopathy.  Neurological: He is alert and oriented to person, place, and time. He has normal reflexes. No cranial nerve deficit. He exhibits normal muscle tone. Coordination normal.  Skin: Skin is warm and dry. No rash noted.  Psychiatric: He has a normal mood and affect. His behavior is normal. Judgment and thought content normal.  B shins are brownish L>R Trace edema CTS signs neg on R,L  Lab Results  Component Value Date   WBC 6.8 03/12/2012   HGB 14.3 03/12/2012   HCT 40.7 03/12/2012   PLT 198 03/12/2012   GLUCOSE 90 09/09/2012   CHOL 173 09/09/2012   TRIG 77.0 09/09/2012   HDL 60.60 09/09/2012   LDLDIRECT 136.2 09/28/2009   LDLCALC 97 09/09/2012   ALT 31 01/10/2012   AST 32 01/10/2012   NA 139 09/09/2012   K 3.9 09/09/2012   CL 104 09/09/2012   CREATININE 1.2 09/09/2012   BUN 19 09/09/2012   CO2 24 09/09/2012   TSH 1.49 10/08/2011   PSA 0.41 07/02/2011   INR 1.1 ratio* 10/06/2009   HGBA1C 5.7 09/09/2012         Assessment & Plan:

## 2012-12-10 NOTE — Assessment & Plan Note (Signed)
Continue with current prescription therapy as reflected on the Med list.  

## 2012-12-10 NOTE — Assessment & Plan Note (Signed)
No chief complaint on file.

## 2012-12-10 NOTE — Assessment & Plan Note (Signed)
Wt Readings from Last 3 Encounters:  12/10/12 185 lb (83.915 kg)  11/11/12 185 lb (83.915 kg)  09/10/12 182 lb (82.555 kg)   resolved

## 2012-12-11 ENCOUNTER — Encounter: Payer: Self-pay | Admitting: Cardiology

## 2012-12-11 ENCOUNTER — Ambulatory Visit (INDEPENDENT_AMBULATORY_CARE_PROVIDER_SITE_OTHER): Payer: Medicare Other | Admitting: Cardiology

## 2012-12-11 VITALS — BP 122/72 | HR 67 | Ht 67.0 in | Wt 185.0 lb

## 2012-12-11 DIAGNOSIS — I359 Nonrheumatic aortic valve disorder, unspecified: Secondary | ICD-10-CM

## 2012-12-11 DIAGNOSIS — I4510 Unspecified right bundle-branch block: Secondary | ICD-10-CM

## 2012-12-11 DIAGNOSIS — E785 Hyperlipidemia, unspecified: Secondary | ICD-10-CM

## 2012-12-11 DIAGNOSIS — I872 Venous insufficiency (chronic) (peripheral): Secondary | ICD-10-CM | POA: Diagnosis not present

## 2012-12-11 DIAGNOSIS — I6529 Occlusion and stenosis of unspecified carotid artery: Secondary | ICD-10-CM

## 2012-12-11 DIAGNOSIS — I350 Nonrheumatic aortic (valve) stenosis: Secondary | ICD-10-CM | POA: Insufficient documentation

## 2012-12-11 DIAGNOSIS — I2510 Atherosclerotic heart disease of native coronary artery without angina pectoris: Secondary | ICD-10-CM | POA: Diagnosis not present

## 2012-12-11 DIAGNOSIS — I10 Essential (primary) hypertension: Secondary | ICD-10-CM

## 2012-12-11 NOTE — Assessment & Plan Note (Signed)
Murmur is more prominent today. He had a thickened aortic valve and 2007. We'll repeat echocardiography to assess for any degree of aortic stenosis. He is asymptomatic. It is most likely mild.

## 2012-12-11 NOTE — Assessment & Plan Note (Signed)
LDL at goal. Recheck in one year.

## 2012-12-11 NOTE — Assessment & Plan Note (Signed)
Stable. Continue secondary medical therapy. Followup in a year with Dr. Eden Emms.

## 2012-12-11 NOTE — Progress Notes (Signed)
HPI [name] [name] returns today for evaluation and management of his coronary artery disease, carotid artery disease, history of coronary bypass grafting, and multiple cardiac risk factors. He has lost some weight. His hemoglobin A1c was 5.7 yesterday and his LDL is at goal. His blood pressures been under good control. He denies any angina or ischemic symptoms. His lower extremity edema is stable right worse than left.  Past Medical History  Diagnosis Date  . Peripheral vascular disease   . Carotid artery disease   . Hyperlipidemia   . Glucose intolerance (impaired glucose tolerance)   . DJD (degenerative joint disease)     right hip  . Low back pain     facet arthropathy  . Hx of colonic polyps 2004    hyperplastic  . Glaucoma   . Herpes zoster 2009    r. scalp and opth  . Postherpetic neuralgia 2009  . Pneumonia 2009    rll with a small effusion  . Lumbago   . Coronary artery disease     sees Dr. Daleen Squibb  . Hypertension     sees Dr. Cheron Schaumann  . GERD (gastroesophageal reflux disease)     hx of  . Headache(784.0)     hx of migraines    Current Outpatient Prescriptions  Medication Sig Dispense Refill  . amLODipine (NORVASC) 5 MG tablet Take 5 mg by mouth daily.      Marland Kitchen aspirin 325 MG tablet Take 325 mg by mouth daily.       . Cholecalciferol (VITAMIN D3) 1000 UNITS CAPS Take 1 capsule by mouth daily.        . clopidogrel (PLAVIX) 75 MG tablet Take 75 mg by mouth daily.      . dorzolamide-timolol (COSOPT) 22.3-6.8 MG/ML ophthalmic solution       . ezetimibe (ZETIA) 10 MG tablet Take 10 mg by mouth daily.      . fluticasone (FLONASE) 50 MCG/ACT nasal spray Place 2 sprays into the nose daily.  16 g  6  . gatifloxacin (ZYMAXID) 0.5 % SOLN Place 1 drop into the left eye 4 (four) times daily.      . isosorbide mononitrate (IMDUR) 60 MG 24 hr tablet Take 60 mg by mouth daily.      Marland Kitchen losartan (COZAAR) 100 MG tablet TAKE 1 TABLET DAILY  90 tablet  3  . metoprolol-hydrochlorothiazide  (LOPRESSOR HCT) 100-25 MG per tablet Take 1 tablet by mouth daily.      . nitroGLYCERIN (NITROSTAT) 0.4 MG SL tablet Place 0.4 mg under the tongue every 5 (five) minutes as needed. For chest pain      . Polyethyl Glycol-Propyl Glycol (SYSTANE OP) Place 1 drop into both eyes as needed. For dry eyes      . prednisoLONE acetate (PRED FORTE) 1 % ophthalmic suspension       . PrednisoLONE Sodium Phosphate (PREDNISOL OP) Place 1 drop into the left eye 4 (four) times daily.      . rosuvastatin (CRESTOR) 40 MG tablet Take 40 mg by mouth daily.      . travoprost, benzalkonium, (TRAVATAN) 0.004 % ophthalmic solution Place 1 drop into both eyes at bedtime.        No current facility-administered medications for this visit.    Allergies  Allergen Reactions  . Benazepril Hcl     REACTION: cough  . Ezetimibe-Simvastatin     REACTION: aches    Family History  Problem Relation Age of Onset  . Breast cancer Mother   .  Cancer Mother     breast  . Heart disease Father     History   Social History  . Marital Status: Divorced    Spouse Name: N/A    Number of Children: 3  . Years of Education: N/A   Occupational History  .     Social History Main Topics  . Smoking status: Former Smoker -- 0.75 packs/day for 25 years  . Smokeless tobacco: Never Used     Comment: quit by the time he was 79 yrs old  . Alcohol Use: Yes     Comment: infrequent glass of wine  . Drug Use: No  . Sexual Activity: Not Currently   Other Topics Concern  . Not on file   Social History Narrative  . No narrative on file    ROS ALL NEGATIVE EXCEPT THOSE NOTED IN HPI  PE  General Appearance: well developed, well nourished in no acute distress, he is clearly lost weight HEENT: symmetrical face, PERRLA, good dentition  Neck: no JVD, thyromegaly, or adenopathy, trachea midline Chest: symmetric without deformity Cardiac: PMI non-displaced, RRR, normal S1, S2, no gallop, 2/6 systolic murmur consistent with aortic  sclerosis Lung: clear to ausculation and percussion Vascular: all pulses full without bruits  Abdominal: nondistended, nontender, good bowel sounds, no HSM, no bruits Extremities: no cyanosis, clubbing , mild edema right greater than left no sign of DVT, no varicosities  Skin: normal color, no rashes Neuro: alert and oriented x 3, non-focal Pysch: normal affect  EKG Normal sinus rhythm, right bundle branch block, significant T wave inversion laterally which is stable. BMET    Component Value Date/Time   NA 139 12/10/2012 1455   K 4.0 12/10/2012 1455   CL 106 12/10/2012 1455   CO2 26 12/10/2012 1455   GLUCOSE 96 12/10/2012 1455   BUN 18 12/10/2012 1455   CREATININE 1.2 12/10/2012 1455   CALCIUM 9.3 12/10/2012 1455   GFRNONAA 63* 03/12/2012 1349   GFRAA 73* 03/12/2012 1349    Lipid Panel     Component Value Date/Time   CHOL 173 09/09/2012 1643   TRIG 77.0 09/09/2012 1643   HDL 60.60 09/09/2012 1643   CHOLHDL 3 09/09/2012 1643   VLDL 15.4 09/09/2012 1643   LDLCALC 97 09/09/2012 1643    CBC    Component Value Date/Time   WBC 6.8 03/12/2012 1349   RBC 4.77 03/12/2012 1349   HGB 14.3 03/12/2012 1349   HCT 40.7 03/12/2012 1349   PLT 198 03/12/2012 1349   MCV 85.3 03/12/2012 1349   MCH 30.0 03/12/2012 1349   MCHC 35.1 03/12/2012 1349   RDW 13.8 03/12/2012 1349   LYMPHSABS 2.2 07/02/2011 1554   MONOABS 0.6 07/02/2011 1554   EOSABS 0.4 07/02/2011 1554   BASOSABS 0.0 07/02/2011 1554

## 2012-12-11 NOTE — Patient Instructions (Signed)
  Your physician has requested that you have an echocardiogram. Echocardiography is a painless test that uses sound waves to create images of your heart. It provides your doctor with information about the size and shape of your heart and how well your heart's chambers and valves are working. This procedure takes approximately one hour. There are no restrictions for this procedure.   Your physician encouraged you to lose weight for better health.  Your physician recommends that you continue on your current medications as directed. Please refer to the Current Medication list given to you today.  Your physician wants you to follow-up in:  Dr. Charlton Haws in 1 year.  You will receive a reminder letter in the mail two months in advance. If you don't receive a letter, please call our office to schedule the follow-up appointment.  Your physician has requested that you have a carotid duplex in July 2015  This test is an ultrasound of the carotid arteries in your neck. It looks at blood flow through these arteries that supply the brain with blood. Allow one hour for this exam. There are no restrictions or special instructions.  Remember to have cholesterol checked again next May 2015 with your primary care doctor

## 2012-12-11 NOTE — Assessment & Plan Note (Signed)
Stable. Repeat carotid Dopplers in July of 2015

## 2012-12-11 NOTE — Assessment & Plan Note (Signed)
Good control. No change in medical therapy. Continue to encourage weight loss.

## 2012-12-17 DIAGNOSIS — H2510 Age-related nuclear cataract, unspecified eye: Secondary | ICD-10-CM | POA: Diagnosis not present

## 2012-12-17 DIAGNOSIS — H4011X0 Primary open-angle glaucoma, stage unspecified: Secondary | ICD-10-CM | POA: Diagnosis not present

## 2012-12-31 DIAGNOSIS — H1030 Unspecified acute conjunctivitis, unspecified eye: Secondary | ICD-10-CM | POA: Diagnosis not present

## 2012-12-31 DIAGNOSIS — H20019 Primary iridocyclitis, unspecified eye: Secondary | ICD-10-CM | POA: Diagnosis not present

## 2013-01-05 ENCOUNTER — Encounter: Payer: Self-pay | Admitting: Internal Medicine

## 2013-01-05 ENCOUNTER — Ambulatory Visit (HOSPITAL_COMMUNITY): Payer: Medicare Other | Attending: Cardiology

## 2013-01-05 DIAGNOSIS — I350 Nonrheumatic aortic (valve) stenosis: Secondary | ICD-10-CM

## 2013-01-05 DIAGNOSIS — Z87891 Personal history of nicotine dependence: Secondary | ICD-10-CM | POA: Insufficient documentation

## 2013-01-05 DIAGNOSIS — Z951 Presence of aortocoronary bypass graft: Secondary | ICD-10-CM | POA: Insufficient documentation

## 2013-01-05 DIAGNOSIS — I080 Rheumatic disorders of both mitral and aortic valves: Secondary | ICD-10-CM | POA: Diagnosis not present

## 2013-01-05 DIAGNOSIS — R011 Cardiac murmur, unspecified: Secondary | ICD-10-CM | POA: Diagnosis not present

## 2013-01-05 DIAGNOSIS — I739 Peripheral vascular disease, unspecified: Secondary | ICD-10-CM | POA: Insufficient documentation

## 2013-01-05 DIAGNOSIS — I10 Essential (primary) hypertension: Secondary | ICD-10-CM | POA: Diagnosis not present

## 2013-01-05 DIAGNOSIS — E785 Hyperlipidemia, unspecified: Secondary | ICD-10-CM | POA: Insufficient documentation

## 2013-01-05 DIAGNOSIS — I2510 Atherosclerotic heart disease of native coronary artery without angina pectoris: Secondary | ICD-10-CM | POA: Diagnosis not present

## 2013-01-05 NOTE — Progress Notes (Signed)
Echocardiogram performed.  

## 2013-01-07 DIAGNOSIS — H1030 Unspecified acute conjunctivitis, unspecified eye: Secondary | ICD-10-CM | POA: Diagnosis not present

## 2013-01-07 DIAGNOSIS — H20019 Primary iridocyclitis, unspecified eye: Secondary | ICD-10-CM | POA: Diagnosis not present

## 2013-01-29 DIAGNOSIS — H20019 Primary iridocyclitis, unspecified eye: Secondary | ICD-10-CM | POA: Diagnosis not present

## 2013-01-29 DIAGNOSIS — H1030 Unspecified acute conjunctivitis, unspecified eye: Secondary | ICD-10-CM | POA: Diagnosis not present

## 2013-02-11 ENCOUNTER — Encounter: Payer: Self-pay | Admitting: Podiatry

## 2013-02-11 ENCOUNTER — Ambulatory Visit (INDEPENDENT_AMBULATORY_CARE_PROVIDER_SITE_OTHER): Payer: Medicare Other | Admitting: Podiatry

## 2013-02-11 VITALS — BP 132/71 | HR 50 | Ht 67.0 in | Wt 184.0 lb

## 2013-02-11 DIAGNOSIS — M79609 Pain in unspecified limb: Secondary | ICD-10-CM

## 2013-02-11 DIAGNOSIS — B351 Tinea unguium: Secondary | ICD-10-CM

## 2013-02-11 NOTE — Patient Instructions (Signed)
Seen for hypertrophic nails. All nails debrided. Return in 3 months or as needed.  

## 2013-02-11 NOTE — Progress Notes (Signed)
Subjective:  79 y.o. year old male patient presents complaining of painful nails. Patient requests toe nails trimmed.   Objective:  Pedal pulses not palpable, bilateral.  Thick mycotic nails x 9 (2nd right missing).  Hallux valgus with bunion on left.  All epicritic and tactile sensations grossly intact.  No acute or abnormal skin lesions noted.   Assessment:  Mycotic nails x 9.  PVD.  Venous insufficiency.   Plan:  All nails debrided.

## 2013-03-06 DIAGNOSIS — H1130 Conjunctival hemorrhage, unspecified eye: Secondary | ICD-10-CM | POA: Diagnosis not present

## 2013-03-06 DIAGNOSIS — H4011X0 Primary open-angle glaucoma, stage unspecified: Secondary | ICD-10-CM | POA: Diagnosis not present

## 2013-03-10 ENCOUNTER — Encounter: Payer: Self-pay | Admitting: Internal Medicine

## 2013-03-10 ENCOUNTER — Ambulatory Visit (INDEPENDENT_AMBULATORY_CARE_PROVIDER_SITE_OTHER): Payer: Medicare Other | Admitting: Internal Medicine

## 2013-03-10 VITALS — BP 142/84 | HR 80 | Temp 99.8°F | Resp 16 | Wt 180.0 lb

## 2013-03-10 DIAGNOSIS — I2510 Atherosclerotic heart disease of native coronary artery without angina pectoris: Secondary | ICD-10-CM

## 2013-03-10 DIAGNOSIS — M545 Low back pain, unspecified: Secondary | ICD-10-CM

## 2013-03-10 DIAGNOSIS — I6529 Occlusion and stenosis of unspecified carotid artery: Secondary | ICD-10-CM

## 2013-03-10 DIAGNOSIS — I10 Essential (primary) hypertension: Secondary | ICD-10-CM | POA: Diagnosis not present

## 2013-03-10 MED ORDER — NITROGLYCERIN 0.4 MG SL SUBL: 0.4000 mg | SUBLINGUAL_TABLET | SUBLINGUAL | Status: DC | PRN

## 2013-03-10 MED ORDER — LOSARTAN POTASSIUM 100 MG PO TABS: 100.0000 mg | ORAL_TABLET | Freq: Every day | ORAL | Status: DC

## 2013-03-10 MED ORDER — CLOPIDOGREL BISULFATE 75 MG PO TABS: 75.0000 mg | ORAL_TABLET | Freq: Every day | ORAL | Status: DC

## 2013-03-10 MED ORDER — METOPROLOL-HYDROCHLOROTHIAZIDE 100-25 MG PO TABS: 1.0000 | ORAL_TABLET | Freq: Every day | ORAL | Status: DC

## 2013-03-10 MED ORDER — AMLODIPINE BESYLATE 5 MG PO TABS: 5.0000 mg | ORAL_TABLET | Freq: Every day | ORAL | Status: DC

## 2013-03-10 MED ORDER — ROSUVASTATIN CALCIUM 40 MG PO TABS: 40.0000 mg | ORAL_TABLET | Freq: Every day | ORAL | Status: DC

## 2013-03-10 MED ORDER — EZETIMIBE 10 MG PO TABS: 10.0000 mg | ORAL_TABLET | Freq: Every day | ORAL | Status: DC

## 2013-03-10 MED ORDER — ISOSORBIDE MONONITRATE ER 60 MG PO TB24: 60.0000 mg | ORAL_TABLET | Freq: Every day | ORAL | Status: DC

## 2013-03-10 NOTE — Progress Notes (Signed)
   Subjective:     HPI   The patient presents for a follow-up of chronic dyslipidemia, CAD, elev glu controlled with medicines and HTN. F/u R hand CTS - shot helped some   Wt Readings from Last 3 Encounters:  03/10/13 180 lb (81.647 kg)  02/11/13 184 lb (83.462 kg)  12/11/12 185 lb (83.915 kg)   BP Readings from Last 3 Encounters:  03/10/13 142/84  02/11/13 132/71  12/11/12 122/72      Review of Systems  Constitutional: Negative for appetite change, fatigue and unexpected weight change.  HENT: Negative for congestion, nosebleeds, sneezing, sore throat and trouble swallowing.   Eyes: Negative for itching and visual disturbance.  Respiratory: Negative for cough.   Cardiovascular: Negative for chest pain, palpitations and leg swelling.  Gastrointestinal: Negative for nausea, diarrhea, blood in stool and abdominal distention.  Genitourinary: Negative for frequency and hematuria.  Musculoskeletal: Negative for back pain, gait problem, joint swelling and neck pain.  Skin: Negative for rash.  Neurological: Negative for dizziness, tremors, speech difficulty and weakness.  Psychiatric/Behavioral: Negative for sleep disturbance, dysphoric mood and agitation. The patient is not nervous/anxious.        Objective:   Physical Exam  Constitutional: He is oriented to person, place, and time. He appears well-developed.  Obese   HENT:  Mouth/Throat: Oropharynx is clear and moist.  Eyes: Conjunctivae are normal. Pupils are equal, round, and reactive to light.  Neck: Normal range of motion. No JVD present. No thyromegaly present.  Cardiovascular: Normal rate, regular rhythm and intact distal pulses.  Exam reveals no gallop and no friction rub.   Murmur (2-3/6 sys) heard. Pulmonary/Chest: Effort normal and breath sounds normal. No respiratory distress. He has no wheezes. He has no rales. He exhibits no tenderness.  Abdominal: Soft. Bowel sounds are normal. He exhibits no distension and  no mass. There is no tenderness. There is no rebound and no guarding.  Musculoskeletal: Normal range of motion. He exhibits no edema and no tenderness.  Lymphadenopathy:    He has no cervical adenopathy.  Neurological: He is alert and oriented to person, place, and time. He has normal reflexes. No cranial nerve deficit. He exhibits normal muscle tone. Coordination normal.  Skin: Skin is warm and dry. No rash noted.  Psychiatric: He has a normal mood and affect. His behavior is normal. Judgment and thought content normal.  B shins are brownish L>R Trace edema CTS signs neg on R,L  Lab Results  Component Value Date   WBC 6.8 03/12/2012   HGB 14.3 03/12/2012   HCT 40.7 03/12/2012   PLT 198 03/12/2012   GLUCOSE 96 12/10/2012   CHOL 173 09/09/2012   TRIG 77.0 09/09/2012   HDL 60.60 09/09/2012   LDLDIRECT 136.2 09/28/2009   LDLCALC 97 09/09/2012   ALT 31 01/10/2012   AST 32 01/10/2012   NA 139 12/10/2012   K 4.0 12/10/2012   CL 106 12/10/2012   CREATININE 1.2 12/10/2012   BUN 18 12/10/2012   CO2 26 12/10/2012   TSH 1.49 10/08/2011   PSA 0.41 07/02/2011   INR 1.1 ratio* 10/06/2009   HGBA1C 5.7 12/10/2012         Assessment & Plan:

## 2013-03-10 NOTE — Assessment & Plan Note (Signed)
Continue with current prescription therapy as reflected on the Med list.  

## 2013-03-10 NOTE — Assessment & Plan Note (Signed)
Better  

## 2013-03-10 NOTE — Progress Notes (Signed)
Pre visit review using our clinic review tool, if applicable. No additional management support is needed unless otherwise documented below in the visit note. 

## 2013-03-11 ENCOUNTER — Other Ambulatory Visit (INDEPENDENT_AMBULATORY_CARE_PROVIDER_SITE_OTHER): Payer: Medicare Other

## 2013-03-11 DIAGNOSIS — I10 Essential (primary) hypertension: Secondary | ICD-10-CM | POA: Diagnosis not present

## 2013-03-11 LAB — BASIC METABOLIC PANEL
BUN: 18 mg/dL (ref 6–23)
CO2: 25 mEq/L (ref 19–32)
Chloride: 105 mEq/L (ref 96–112)
Creatinine, Ser: 1.1 mg/dL (ref 0.4–1.5)
Glucose, Bld: 107 mg/dL — ABNORMAL HIGH (ref 70–99)
Potassium: 4.1 mEq/L (ref 3.5–5.1)
Sodium: 137 mEq/L (ref 135–145)

## 2013-03-18 DIAGNOSIS — H04129 Dry eye syndrome of unspecified lacrimal gland: Secondary | ICD-10-CM | POA: Diagnosis not present

## 2013-03-18 DIAGNOSIS — H4011X0 Primary open-angle glaucoma, stage unspecified: Secondary | ICD-10-CM | POA: Diagnosis not present

## 2013-04-29 DIAGNOSIS — H2589 Other age-related cataract: Secondary | ICD-10-CM | POA: Diagnosis not present

## 2013-04-29 DIAGNOSIS — H4011X0 Primary open-angle glaucoma, stage unspecified: Secondary | ICD-10-CM | POA: Diagnosis not present

## 2013-05-06 DIAGNOSIS — H4011X0 Primary open-angle glaucoma, stage unspecified: Secondary | ICD-10-CM | POA: Diagnosis not present

## 2013-05-06 DIAGNOSIS — H1130 Conjunctival hemorrhage, unspecified eye: Secondary | ICD-10-CM | POA: Diagnosis not present

## 2013-05-15 ENCOUNTER — Ambulatory Visit: Payer: Medicare Other | Admitting: Podiatry

## 2013-05-19 ENCOUNTER — Ambulatory Visit (INDEPENDENT_AMBULATORY_CARE_PROVIDER_SITE_OTHER): Payer: Medicare Other | Admitting: Podiatry

## 2013-05-19 ENCOUNTER — Encounter: Payer: Self-pay | Admitting: Podiatry

## 2013-05-19 VITALS — BP 114/45 | HR 56 | Ht 67.0 in | Wt 176.0 lb

## 2013-05-19 DIAGNOSIS — B351 Tinea unguium: Secondary | ICD-10-CM

## 2013-05-19 DIAGNOSIS — M25579 Pain in unspecified ankle and joints of unspecified foot: Secondary | ICD-10-CM

## 2013-05-19 NOTE — Progress Notes (Signed)
Subjective:  80 y.o. year old male patient presents complaining of painful nails. Patient requests toe nails trimmed.  Stated that he has no new problems.   Objective:  Pedal pulses not palpable, bilateral.  Thick mycotic nails x 9 (2nd right missing).  Hallux valgus with bunion on left.  All epicritic and tactile sensations grossly intact.  No acute or abnormal skin lesions noted.   Assessment:  Mycotic nails x 9.  PVD.  Venous insufficiency.   Plan:  All nails debrided.

## 2013-05-19 NOTE — Patient Instructions (Signed)
Seen for hypertrophic nails. All nails debrided. Return in 3 months or as needed.  

## 2013-06-10 ENCOUNTER — Encounter: Payer: Self-pay | Admitting: Internal Medicine

## 2013-06-10 ENCOUNTER — Ambulatory Visit (INDEPENDENT_AMBULATORY_CARE_PROVIDER_SITE_OTHER): Payer: Medicare Other | Admitting: Internal Medicine

## 2013-06-10 VITALS — BP 150/90 | HR 80 | Temp 99.5°F | Resp 16 | Wt 173.0 lb

## 2013-06-10 DIAGNOSIS — I10 Essential (primary) hypertension: Secondary | ICD-10-CM

## 2013-06-10 DIAGNOSIS — E785 Hyperlipidemia, unspecified: Secondary | ICD-10-CM

## 2013-06-10 DIAGNOSIS — M545 Low back pain, unspecified: Secondary | ICD-10-CM | POA: Diagnosis not present

## 2013-06-10 DIAGNOSIS — N320 Bladder-neck obstruction: Secondary | ICD-10-CM

## 2013-06-10 DIAGNOSIS — I2510 Atherosclerotic heart disease of native coronary artery without angina pectoris: Secondary | ICD-10-CM

## 2013-06-10 DIAGNOSIS — R7309 Other abnormal glucose: Secondary | ICD-10-CM

## 2013-06-10 DIAGNOSIS — L853 Xerosis cutis: Secondary | ICD-10-CM

## 2013-06-10 DIAGNOSIS — R739 Hyperglycemia, unspecified: Secondary | ICD-10-CM

## 2013-06-10 DIAGNOSIS — L738 Other specified follicular disorders: Secondary | ICD-10-CM

## 2013-06-10 DIAGNOSIS — Z0000 Encounter for general adult medical examination without abnormal findings: Secondary | ICD-10-CM

## 2013-06-10 NOTE — Assessment & Plan Note (Signed)
Continue with current prescription therapy as reflected on the Med list.  

## 2013-06-10 NOTE — Progress Notes (Signed)
Pre visit review using our clinic review tool, if applicable. No additional management support is needed unless otherwise documented below in the visit note. 

## 2013-06-10 NOTE — Progress Notes (Signed)
   Subjective:     HPI   The patient presents for a follow-up of chronic dyslipidemia, CAD, elev glu controlled with medicines and HTN.  F/u R hand CTS - doing ok C/o dry skin   Wt Readings from Last 3 Encounters:  06/10/13 173 lb (78.472 kg)  05/19/13 176 lb (79.833 kg)  03/10/13 180 lb (81.647 kg)   BP Readings from Last 3 Encounters:  06/10/13 150/90  05/19/13 114/45  03/10/13 142/84      Review of Systems  Constitutional: Negative for appetite change, fatigue and unexpected weight change.  HENT: Negative for congestion, nosebleeds, sneezing, sore throat and trouble swallowing.   Eyes: Negative for itching and visual disturbance.  Respiratory: Negative for cough.   Cardiovascular: Negative for chest pain, palpitations and leg swelling.  Gastrointestinal: Negative for nausea, diarrhea, blood in stool and abdominal distention.  Genitourinary: Negative for frequency and hematuria.  Musculoskeletal: Negative for back pain, gait problem, joint swelling and neck pain.  Skin: Negative for rash.  Neurological: Negative for dizziness, tremors, speech difficulty and weakness.  Psychiatric/Behavioral: Negative for sleep disturbance, dysphoric mood and agitation. The patient is not nervous/anxious.        Objective:   Physical Exam  Constitutional: He is oriented to person, place, and time. He appears well-developed.  Obese   HENT:  Mouth/Throat: Oropharynx is clear and moist.  Eyes: Conjunctivae are normal. Pupils are equal, round, and reactive to light.  Neck: Normal range of motion. No JVD present. No thyromegaly present.  Cardiovascular: Normal rate, regular rhythm and intact distal pulses.  Exam reveals no gallop and no friction rub.   Murmur (2-3/6 sys) heard. Pulmonary/Chest: Effort normal and breath sounds normal. No respiratory distress. He has no wheezes. He has no rales. He exhibits no tenderness.  Abdominal: Soft. Bowel sounds are normal. He exhibits no  distension and no mass. There is no tenderness. There is no rebound and no guarding.  Musculoskeletal: Normal range of motion. He exhibits no edema and no tenderness.  Lymphadenopathy:    He has no cervical adenopathy.  Neurological: He is alert and oriented to person, place, and time. He has normal reflexes. No cranial nerve deficit. He exhibits normal muscle tone. Coordination normal.  Skin: Skin is warm and dry. No rash noted.  Psychiatric: He has a normal mood and affect. His behavior is normal. Judgment and thought content normal.  B shins are brownish L>R Trace edema   Lab Results  Component Value Date   WBC 6.8 03/12/2012   HGB 14.3 03/12/2012   HCT 40.7 03/12/2012   PLT 198 03/12/2012   GLUCOSE 107* 03/11/2013   CHOL 173 09/09/2012   TRIG 77.0 09/09/2012   HDL 60.60 09/09/2012   LDLDIRECT 136.2 09/28/2009   LDLCALC 97 09/09/2012   ALT 31 01/10/2012   AST 32 01/10/2012   NA 137 03/11/2013   K 4.1 03/11/2013   CL 105 03/11/2013   CREATININE 1.1 03/11/2013   BUN 18 03/11/2013   CO2 25 03/11/2013   TSH 1.49 10/08/2011   PSA 0.41 07/02/2011   INR 1.1 ratio* 10/06/2009   HGBA1C 5.7 12/10/2012         Assessment & Plan:

## 2013-06-10 NOTE — Assessment & Plan Note (Signed)
Lotion Use less soap

## 2013-06-12 ENCOUNTER — Telehealth: Payer: Self-pay | Admitting: Internal Medicine

## 2013-06-12 NOTE — Telephone Encounter (Signed)
Relevant patient education mailed to patient.  

## 2013-06-29 DIAGNOSIS — H4011X0 Primary open-angle glaucoma, stage unspecified: Secondary | ICD-10-CM | POA: Diagnosis not present

## 2013-08-17 ENCOUNTER — Encounter: Payer: Self-pay | Admitting: Podiatry

## 2013-08-17 ENCOUNTER — Ambulatory Visit (INDEPENDENT_AMBULATORY_CARE_PROVIDER_SITE_OTHER): Payer: Medicare Other | Admitting: Podiatry

## 2013-08-17 VITALS — BP 112/47 | HR 66 | Ht 67.0 in | Wt 168.0 lb

## 2013-08-17 DIAGNOSIS — M79609 Pain in unspecified limb: Secondary | ICD-10-CM

## 2013-08-17 DIAGNOSIS — B351 Tinea unguium: Secondary | ICD-10-CM | POA: Diagnosis not present

## 2013-08-17 DIAGNOSIS — M79606 Pain in leg, unspecified: Secondary | ICD-10-CM | POA: Insufficient documentation

## 2013-08-17 NOTE — Patient Instructions (Signed)
Seen for hypertrophic nails. All nails debrided. Return in 3 months or as needed.  

## 2013-08-17 NOTE — Progress Notes (Signed)
Subjective:  80 y.o. year old male patient presents complaining of painful nails. Patient requests toe nails trimmed. Right great toe nail gets ingrown nail and hurts to walk.  Objective:  Pedal pulses not palpable, bilateral.  Thick mycotic nails x 9 (2nd right missing).  Ingrown nail right great toe without infection or inflammation.  Hallux valgus with bunion on left.  All epicritic and tactile sensations grossly intact.  No acute or abnormal skin lesions noted.   Assessment:  Mycotic nails x 9.  PVD.  Venous insufficiency.   Plan:  All nails debrided.

## 2013-08-26 DIAGNOSIS — H2589 Other age-related cataract: Secondary | ICD-10-CM | POA: Diagnosis not present

## 2013-08-26 DIAGNOSIS — H4011X0 Primary open-angle glaucoma, stage unspecified: Secondary | ICD-10-CM | POA: Diagnosis not present

## 2013-09-04 ENCOUNTER — Other Ambulatory Visit (INDEPENDENT_AMBULATORY_CARE_PROVIDER_SITE_OTHER): Payer: Medicare Other

## 2013-09-04 DIAGNOSIS — L738 Other specified follicular disorders: Secondary | ICD-10-CM

## 2013-09-04 DIAGNOSIS — I2510 Atherosclerotic heart disease of native coronary artery without angina pectoris: Secondary | ICD-10-CM | POA: Diagnosis not present

## 2013-09-04 DIAGNOSIS — I10 Essential (primary) hypertension: Secondary | ICD-10-CM | POA: Diagnosis not present

## 2013-09-04 DIAGNOSIS — Z0000 Encounter for general adult medical examination without abnormal findings: Secondary | ICD-10-CM

## 2013-09-04 DIAGNOSIS — M545 Low back pain, unspecified: Secondary | ICD-10-CM | POA: Diagnosis not present

## 2013-09-04 DIAGNOSIS — R739 Hyperglycemia, unspecified: Secondary | ICD-10-CM

## 2013-09-04 DIAGNOSIS — L853 Xerosis cutis: Secondary | ICD-10-CM

## 2013-09-04 DIAGNOSIS — E785 Hyperlipidemia, unspecified: Secondary | ICD-10-CM

## 2013-09-04 DIAGNOSIS — N320 Bladder-neck obstruction: Secondary | ICD-10-CM | POA: Diagnosis not present

## 2013-09-04 DIAGNOSIS — R7309 Other abnormal glucose: Secondary | ICD-10-CM

## 2013-09-04 DIAGNOSIS — Z79899 Other long term (current) drug therapy: Secondary | ICD-10-CM | POA: Diagnosis not present

## 2013-09-04 DIAGNOSIS — Z125 Encounter for screening for malignant neoplasm of prostate: Secondary | ICD-10-CM

## 2013-09-04 LAB — HEPATIC FUNCTION PANEL
ALT: 41 U/L (ref 0–53)
AST: 46 U/L — AB (ref 0–37)
Albumin: 3.7 g/dL (ref 3.5–5.2)
Alkaline Phosphatase: 61 U/L (ref 39–117)
BILIRUBIN DIRECT: 0.2 mg/dL (ref 0.0–0.3)
Total Bilirubin: 1.3 mg/dL — ABNORMAL HIGH (ref 0.2–1.2)
Total Protein: 6.2 g/dL (ref 6.0–8.3)

## 2013-09-04 LAB — URINALYSIS
Bilirubin Urine: NEGATIVE
HGB URINE DIPSTICK: NEGATIVE
Ketones, ur: NEGATIVE
Leukocytes, UA: NEGATIVE
NITRITE: NEGATIVE
SPECIFIC GRAVITY, URINE: 1.02 (ref 1.000–1.030)
Total Protein, Urine: NEGATIVE
UROBILINOGEN UA: 0.2 (ref 0.0–1.0)
Urine Glucose: NEGATIVE
pH: 5.5 (ref 5.0–8.0)

## 2013-09-04 LAB — BASIC METABOLIC PANEL
BUN: 30 mg/dL — ABNORMAL HIGH (ref 6–23)
CALCIUM: 9.4 mg/dL (ref 8.4–10.5)
CO2: 25 mEq/L (ref 19–32)
CREATININE: 1.6 mg/dL — AB (ref 0.4–1.5)
Chloride: 107 mEq/L (ref 96–112)
GFR: 54.71 mL/min — ABNORMAL LOW (ref >60.00)
Glucose, Bld: 93 mg/dL (ref 70–99)
Potassium: 4.2 mEq/L (ref 3.5–5.1)
SODIUM: 140 meq/L (ref 135–145)

## 2013-09-04 LAB — CBC WITH DIFFERENTIAL/PLATELET
BASOS PCT: 0.3 % (ref 0.0–3.0)
Basophils Absolute: 0 10*3/uL (ref 0.0–0.1)
EOS PCT: 3.8 % (ref 0.0–5.0)
Eosinophils Absolute: 0.3 10*3/uL (ref 0.0–0.7)
HCT: 37.2 % — ABNORMAL LOW (ref 39.0–52.0)
HEMOGLOBIN: 12.6 g/dL — AB (ref 13.0–17.0)
LYMPHS PCT: 18.9 % (ref 12.0–46.0)
Lymphs Abs: 1.5 10*3/uL (ref 0.7–4.0)
MCHC: 33.9 g/dL (ref 30.0–36.0)
MCV: 88.8 fl (ref 78.0–100.0)
Monocytes Absolute: 0.6 10*3/uL (ref 0.1–1.0)
Monocytes Relative: 7.8 % (ref 3.0–12.0)
Neutro Abs: 5.4 10*3/uL (ref 1.4–7.7)
Neutrophils Relative %: 69.2 % (ref 43.0–77.0)
Platelets: 220 10*3/uL (ref 150.0–400.0)
RBC: 4.19 Mil/uL — AB (ref 4.22–5.81)
RDW: 13.8 % (ref 11.5–15.5)
WBC: 7.8 10*3/uL (ref 4.0–10.5)

## 2013-09-04 LAB — LIPID PANEL
Cholesterol: 158 mg/dL (ref 0–200)
HDL: 59 mg/dL (ref >39.00)
LDL CALC: 90 mg/dL (ref 0–99)
TRIGLYCERIDES: 45 mg/dL (ref 0.0–149.0)
Total CHOL/HDL Ratio: 3
VLDL: 9 mg/dL (ref 0.0–40.0)

## 2013-09-04 LAB — TSH: TSH: 0.92 u[IU]/mL (ref 0.35–4.50)

## 2013-09-04 LAB — PSA: PSA: 0.4 ng/mL (ref 0.10–4.00)

## 2013-09-04 LAB — HEMOGLOBIN A1C: Hgb A1c MFr Bld: 5.6 % (ref 4.6–6.5)

## 2013-09-08 ENCOUNTER — Encounter: Payer: Self-pay | Admitting: Internal Medicine

## 2013-09-08 ENCOUNTER — Ambulatory Visit (INDEPENDENT_AMBULATORY_CARE_PROVIDER_SITE_OTHER): Payer: Medicare Other | Admitting: Internal Medicine

## 2013-09-08 VITALS — BP 120/60 | HR 64 | Temp 98.9°F | Resp 18 | Wt 171.0 lb

## 2013-09-08 DIAGNOSIS — I2510 Atherosclerotic heart disease of native coronary artery without angina pectoris: Secondary | ICD-10-CM | POA: Diagnosis not present

## 2013-09-08 DIAGNOSIS — I10 Essential (primary) hypertension: Secondary | ICD-10-CM | POA: Diagnosis not present

## 2013-09-08 DIAGNOSIS — M545 Low back pain, unspecified: Secondary | ICD-10-CM

## 2013-09-08 DIAGNOSIS — R945 Abnormal results of liver function studies: Secondary | ICD-10-CM

## 2013-09-08 NOTE — Assessment & Plan Note (Signed)
Labs

## 2013-09-08 NOTE — Assessment & Plan Note (Signed)
Continue with current prescription therapy as reflected on the Med list.  

## 2013-09-08 NOTE — Assessment & Plan Note (Signed)
Better  

## 2013-09-08 NOTE — Progress Notes (Signed)
Pre visit review using our clinic review tool, if applicable. No additional management support is needed unless otherwise documented below in the visit note. 

## 2013-09-08 NOTE — Progress Notes (Signed)
   Subjective:     HPI   The patient presents for a follow-up of chronic dyslipidemia, CAD, elev glu controlled with medicines and HTN.  F/u R hand CTS - doing ok F/u dry skin - better   Wt Readings from Last 3 Encounters:  09/08/13 171 lb (77.565 kg)  08/17/13 168 lb (76.204 kg)  06/10/13 173 lb (78.472 kg)   BP Readings from Last 3 Encounters:  09/08/13 120/60  08/17/13 112/47  06/10/13 150/90      Review of Systems  Constitutional: Negative for appetite change, fatigue and unexpected weight change.  HENT: Negative for congestion, nosebleeds, sneezing, sore throat and trouble swallowing.   Eyes: Negative for itching and visual disturbance.  Respiratory: Negative for cough.   Cardiovascular: Negative for chest pain, palpitations and leg swelling.  Gastrointestinal: Negative for nausea, diarrhea, blood in stool and abdominal distention.  Genitourinary: Negative for frequency and hematuria.  Musculoskeletal: Negative for back pain, gait problem, joint swelling and neck pain.  Skin: Negative for rash.  Neurological: Negative for dizziness, tremors, speech difficulty and weakness.  Psychiatric/Behavioral: Negative for sleep disturbance, dysphoric mood and agitation. The patient is not nervous/anxious.        Objective:   Physical Exam  Constitutional: He is oriented to person, place, and time. He appears well-developed.  Obese   HENT:  Mouth/Throat: Oropharynx is clear and moist.  Eyes: Conjunctivae are normal. Pupils are equal, round, and reactive to light.  Neck: Normal range of motion. No JVD present. No thyromegaly present.  Cardiovascular: Normal rate, regular rhythm and intact distal pulses.  Exam reveals no gallop and no friction rub.   Murmur (2-3/6 sys) heard. Pulmonary/Chest: Effort normal and breath sounds normal. No respiratory distress. He has no wheezes. He has no rales. He exhibits no tenderness.  Abdominal: Soft. Bowel sounds are normal. He exhibits no  distension and no mass. There is no tenderness. There is no rebound and no guarding.  Musculoskeletal: Normal range of motion. He exhibits no edema and no tenderness.  Lymphadenopathy:    He has no cervical adenopathy.  Neurological: He is alert and oriented to person, place, and time. He has normal reflexes. No cranial nerve deficit. He exhibits normal muscle tone. Coordination normal.  Skin: Skin is warm and dry. No rash noted.  Psychiatric: He has a normal mood and affect. His behavior is normal. Judgment and thought content normal.  B shins are brownish L>R No edema   Lab Results  Component Value Date   WBC 7.8 09/04/2013   HGB 12.6* 09/04/2013   HCT 37.2* 09/04/2013   PLT 220.0 09/04/2013   GLUCOSE 93 09/04/2013   CHOL 158 09/04/2013   TRIG 45.0 09/04/2013   HDL 59.00 09/04/2013   LDLDIRECT 136.2 09/28/2009   LDLCALC 90 09/04/2013   ALT 41 09/04/2013   AST 46* 09/04/2013   NA 140 09/04/2013   K 4.2 09/04/2013   CL 107 09/04/2013   CREATININE 1.6* 09/04/2013   BUN 30* 09/04/2013   CO2 25 09/04/2013   TSH 0.92 09/04/2013   PSA 0.40 09/04/2013   INR 1.1 ratio* 10/06/2009   HGBA1C 5.6 09/04/2013         Assessment & Plan:

## 2013-10-06 ENCOUNTER — Other Ambulatory Visit (HOSPITAL_COMMUNITY): Payer: Self-pay | Admitting: Cardiology

## 2013-10-06 DIAGNOSIS — I6529 Occlusion and stenosis of unspecified carotid artery: Secondary | ICD-10-CM

## 2013-10-14 ENCOUNTER — Ambulatory Visit (HOSPITAL_COMMUNITY): Payer: Medicare Other | Attending: Cardiovascular Disease | Admitting: Cardiology

## 2013-10-14 DIAGNOSIS — E785 Hyperlipidemia, unspecified: Secondary | ICD-10-CM | POA: Diagnosis not present

## 2013-10-14 DIAGNOSIS — I6529 Occlusion and stenosis of unspecified carotid artery: Secondary | ICD-10-CM | POA: Insufficient documentation

## 2013-10-14 DIAGNOSIS — I10 Essential (primary) hypertension: Secondary | ICD-10-CM | POA: Diagnosis not present

## 2013-10-14 DIAGNOSIS — Z87891 Personal history of nicotine dependence: Secondary | ICD-10-CM | POA: Insufficient documentation

## 2013-10-14 DIAGNOSIS — R42 Dizziness and giddiness: Secondary | ICD-10-CM | POA: Diagnosis not present

## 2013-10-14 NOTE — Progress Notes (Signed)
Carotid duplex completed 

## 2013-10-20 ENCOUNTER — Telehealth: Payer: Self-pay | Admitting: Cardiovascular Disease

## 2013-10-20 NOTE — Telephone Encounter (Signed)
New message     Patient want ultrasound results

## 2013-10-20 NOTE — Telephone Encounter (Signed)
Pt is aware of carotid results Horton Chin RN

## 2013-11-17 ENCOUNTER — Ambulatory Visit (INDEPENDENT_AMBULATORY_CARE_PROVIDER_SITE_OTHER): Payer: Medicare Other | Admitting: Podiatry

## 2013-11-17 ENCOUNTER — Encounter: Payer: Self-pay | Admitting: Podiatry

## 2013-11-17 VITALS — BP 127/56 | HR 58

## 2013-11-17 DIAGNOSIS — M79606 Pain in leg, unspecified: Secondary | ICD-10-CM

## 2013-11-17 DIAGNOSIS — M79609 Pain in unspecified limb: Secondary | ICD-10-CM | POA: Diagnosis not present

## 2013-11-17 DIAGNOSIS — B351 Tinea unguium: Secondary | ICD-10-CM

## 2013-11-17 NOTE — Progress Notes (Signed)
Subjective:  80 y.o. year old male patient presents complaining of painful nails. Patient requests toe nails trimmed.  He is wearing compression socks for swollen ankles.   Objective:  Pedal pulses not palpable, bilateral.  Thick mycotic nails x 9 (2nd right missing).  Ingrown nail right great toe without infection or inflammation.  Hallux valgus with bunion on left.  All epicritic and tactile sensations grossly intact.  No acute or abnormal skin lesions noted.   Assessment:  Mycotic nails x 9.  PVD.  Venous insufficiency.   Plan:  All nails debrided.

## 2013-11-17 NOTE — Patient Instructions (Signed)
Seen for hypertrophic nails. All nails debrided. Return in 3 months or as needed.  

## 2013-12-08 ENCOUNTER — Other Ambulatory Visit: Payer: Medicare Other

## 2013-12-09 ENCOUNTER — Other Ambulatory Visit (INDEPENDENT_AMBULATORY_CARE_PROVIDER_SITE_OTHER): Payer: Medicare Other

## 2013-12-09 ENCOUNTER — Encounter: Payer: Self-pay | Admitting: Internal Medicine

## 2013-12-09 ENCOUNTER — Ambulatory Visit (INDEPENDENT_AMBULATORY_CARE_PROVIDER_SITE_OTHER): Payer: Medicare Other | Admitting: Internal Medicine

## 2013-12-09 VITALS — BP 140/68 | HR 60 | Temp 98.6°F | Wt 167.0 lb

## 2013-12-09 DIAGNOSIS — R634 Abnormal weight loss: Secondary | ICD-10-CM

## 2013-12-09 DIAGNOSIS — I2510 Atherosclerotic heart disease of native coronary artery without angina pectoris: Secondary | ICD-10-CM | POA: Diagnosis not present

## 2013-12-09 DIAGNOSIS — R7309 Other abnormal glucose: Secondary | ICD-10-CM

## 2013-12-09 DIAGNOSIS — R945 Abnormal results of liver function studies: Secondary | ICD-10-CM

## 2013-12-09 DIAGNOSIS — I10 Essential (primary) hypertension: Secondary | ICD-10-CM

## 2013-12-09 DIAGNOSIS — M545 Low back pain, unspecified: Secondary | ICD-10-CM

## 2013-12-09 DIAGNOSIS — H6120 Impacted cerumen, unspecified ear: Secondary | ICD-10-CM | POA: Insufficient documentation

## 2013-12-09 DIAGNOSIS — E785 Hyperlipidemia, unspecified: Secondary | ICD-10-CM | POA: Diagnosis not present

## 2013-12-09 DIAGNOSIS — H6121 Impacted cerumen, right ear: Secondary | ICD-10-CM

## 2013-12-09 LAB — HEPATIC FUNCTION PANEL
ALBUMIN: 3.7 g/dL (ref 3.5–5.2)
ALT: 56 U/L — ABNORMAL HIGH (ref 0–53)
AST: 61 U/L — AB (ref 0–37)
Alkaline Phosphatase: 58 U/L (ref 39–117)
BILIRUBIN TOTAL: 1.4 mg/dL — AB (ref 0.2–1.2)
Bilirubin, Direct: 0.2 mg/dL (ref 0.0–0.3)
TOTAL PROTEIN: 6.7 g/dL (ref 6.0–8.3)

## 2013-12-09 LAB — HEMOGLOBIN A1C: Hgb A1c MFr Bld: 5.6 % (ref 4.6–6.5)

## 2013-12-09 LAB — BASIC METABOLIC PANEL
BUN: 19 mg/dL (ref 6–23)
CO2: 21 mEq/L (ref 19–32)
Calcium: 9.1 mg/dL (ref 8.4–10.5)
Chloride: 105 mEq/L (ref 96–112)
Creatinine, Ser: 1.2 mg/dL (ref 0.4–1.5)
GFR: 77.33 mL/min (ref >60.00)
GLUCOSE: 89 mg/dL (ref 70–99)
POTASSIUM: 3.8 meq/L (ref 3.5–5.1)
SODIUM: 135 meq/L (ref 135–145)

## 2013-12-09 LAB — LIPID PANEL
CHOLESTEROL: 149 mg/dL (ref 0–200)
HDL: 58.3 mg/dL (ref >39.00)
LDL CALC: 80 mg/dL (ref 0–99)
NonHDL: 90.7
Total CHOL/HDL Ratio: 3
Triglycerides: 53 mg/dL (ref 0.0–149.0)
VLDL: 10.6 mg/dL (ref 0.0–40.0)

## 2013-12-09 LAB — TSH: TSH: 0.81 u[IU]/mL (ref 0.35–4.50)

## 2013-12-09 MED ORDER — ROSUVASTATIN CALCIUM 40 MG PO TABS: 20.0000 mg | ORAL_TABLET | Freq: Every day | ORAL | Status: DC

## 2013-12-09 NOTE — Assessment & Plan Note (Signed)
Continue with current prescription therapy as reflected on the Med list. Labs  

## 2013-12-09 NOTE — Assessment & Plan Note (Signed)
Repeat labs Crestor 1/2 tab a day

## 2013-12-09 NOTE — Progress Notes (Signed)
Pre visit review using our clinic review tool, if applicable. No additional management support is needed unless otherwise documented below in the visit note. 

## 2013-12-09 NOTE — Assessment & Plan Note (Signed)
See procedure 

## 2013-12-09 NOTE — Assessment & Plan Note (Signed)
A1c Pt lost more wt

## 2013-12-09 NOTE — Assessment & Plan Note (Signed)
Doing fair 

## 2013-12-09 NOTE — Progress Notes (Signed)
Subjective:     HPI   The patient presents for a follow-up of chronic dyslipidemia, CAD, elev glu controlled with medicines and HTN.  F/u R hand CTS - doing ok F/u dry skin - better C/o R ear discomfort   Wt Readings from Last 3 Encounters:  12/09/13 167 lb (75.751 kg)  09/08/13 171 lb (77.565 kg)  08/17/13 168 lb (76.204 kg)   BP Readings from Last 3 Encounters:  12/09/13 140/68  11/17/13 127/56  09/08/13 120/60      Review of Systems  Constitutional: Negative for appetite change, fatigue and unexpected weight change.  HENT: Negative for congestion, nosebleeds, sneezing, sore throat and trouble swallowing.   Eyes: Negative for itching and visual disturbance.  Respiratory: Negative for cough.   Cardiovascular: Negative for chest pain, palpitations and leg swelling.  Gastrointestinal: Negative for nausea, diarrhea, blood in stool and abdominal distention.  Genitourinary: Negative for frequency and hematuria.  Musculoskeletal: Negative for back pain, gait problem, joint swelling and neck pain.  Skin: Negative for rash.  Neurological: Negative for dizziness, tremors, speech difficulty and weakness.  Psychiatric/Behavioral: Negative for sleep disturbance, dysphoric mood and agitation. The patient is not nervous/anxious.        Objective:   Physical Exam  Constitutional: He is oriented to person, place, and time. He appears well-developed.  Obese   HENT:  Mouth/Throat: Oropharynx is clear and moist.  Eyes: Conjunctivae are normal. Pupils are equal, round, and reactive to light.  Neck: Normal range of motion. No JVD present. No thyromegaly present.  Cardiovascular: Normal rate, regular rhythm and intact distal pulses.  Exam reveals no gallop and no friction rub.   Murmur (2-3/6 sys) heard. Pulmonary/Chest: Effort normal and breath sounds normal. No respiratory distress. He has no wheezes. He has no rales. He exhibits no tenderness.  Abdominal: Soft. Bowel sounds are  normal. He exhibits no distension and no mass. There is no tenderness. There is no rebound and no guarding.  Musculoskeletal: Normal range of motion. He exhibits no edema and no tenderness.  Lymphadenopathy:    He has no cervical adenopathy.  Neurological: He is alert and oriented to person, place, and time. He has normal reflexes. No cranial nerve deficit. He exhibits normal muscle tone. Coordination normal.  Skin: Skin is warm and dry. No rash noted.  Psychiatric: He has a normal mood and affect. His behavior is normal. Judgment and thought content normal.  B shins are brownish L>R No edema R ear wax   Lab Results  Component Value Date   WBC 7.8 09/04/2013   HGB 12.6* 09/04/2013   HCT 37.2* 09/04/2013   PLT 220.0 09/04/2013   GLUCOSE 93 09/04/2013   CHOL 158 09/04/2013   TRIG 45.0 09/04/2013   HDL 59.00 09/04/2013   LDLDIRECT 136.2 09/28/2009   LDLCALC 90 09/04/2013   ALT 41 09/04/2013   AST 46* 09/04/2013   NA 140 09/04/2013   K 4.2 09/04/2013   CL 107 09/04/2013   CREATININE 1.6* 09/04/2013   BUN 30* 09/04/2013   CO2 25 09/04/2013   TSH 0.92 09/04/2013   PSA 0.40 09/04/2013   INR 1.1 ratio* 10/06/2009   HGBA1C 5.6 09/04/2013     Procedure Note :     Procedure :  Ear irrigation   Indication:  Cerumen impaction R   Risks, including pain, dizziness, eardrum perforation, bleeding, infection and others as well as benefits were explained to the patient in detail. Verbal consent was obtained and the patient  agreed to proceed.    We used "The Elephant Ear Irrigation Device" filled with lukewarm water for irrigation. A large amount wax was recovered. Procedure has also required manual wax removal with an ear loop.   Tolerated well. Complications: None.   Postprocedure instructions :  Call if problems.       Assessment & Plan:

## 2013-12-09 NOTE — Assessment & Plan Note (Signed)
Continue with current prescription therapy as reflected on the Med list.  

## 2013-12-09 NOTE — Assessment & Plan Note (Addendum)
Doing fair. Mild Reduce Crestor

## 2013-12-10 ENCOUNTER — Other Ambulatory Visit: Payer: Self-pay | Admitting: Internal Medicine

## 2013-12-16 ENCOUNTER — Ambulatory Visit (INDEPENDENT_AMBULATORY_CARE_PROVIDER_SITE_OTHER): Payer: Medicare Other | Admitting: Cardiovascular Disease

## 2013-12-16 ENCOUNTER — Encounter: Payer: Self-pay | Admitting: Cardiovascular Disease

## 2013-12-16 VITALS — BP 130/60 | HR 65 | Ht 67.0 in | Wt 167.0 lb

## 2013-12-16 DIAGNOSIS — I359 Nonrheumatic aortic valve disorder, unspecified: Secondary | ICD-10-CM | POA: Diagnosis not present

## 2013-12-16 DIAGNOSIS — M79606 Pain in leg, unspecified: Secondary | ICD-10-CM

## 2013-12-16 DIAGNOSIS — I872 Venous insufficiency (chronic) (peripheral): Secondary | ICD-10-CM

## 2013-12-16 DIAGNOSIS — M79669 Pain in unspecified lower leg: Secondary | ICD-10-CM

## 2013-12-16 DIAGNOSIS — I350 Nonrheumatic aortic (valve) stenosis: Secondary | ICD-10-CM

## 2013-12-16 DIAGNOSIS — R0989 Other specified symptoms and signs involving the circulatory and respiratory systems: Secondary | ICD-10-CM | POA: Diagnosis not present

## 2013-12-16 DIAGNOSIS — I4510 Unspecified right bundle-branch block: Secondary | ICD-10-CM

## 2013-12-16 DIAGNOSIS — I2510 Atherosclerotic heart disease of native coronary artery without angina pectoris: Secondary | ICD-10-CM

## 2013-12-16 DIAGNOSIS — M79609 Pain in unspecified limb: Secondary | ICD-10-CM

## 2013-12-16 DIAGNOSIS — I25810 Atherosclerosis of coronary artery bypass graft(s) without angina pectoris: Secondary | ICD-10-CM

## 2013-12-16 DIAGNOSIS — E785 Hyperlipidemia, unspecified: Secondary | ICD-10-CM

## 2013-12-16 NOTE — Assessment & Plan Note (Signed)
Given CAD and carotid disease will order ABI's to assess possible claudication in RLE

## 2013-12-16 NOTE — Assessment & Plan Note (Signed)
Cholesterol is at goal.  Continue current dose of statin and diet Rx.  No myalgias or side effects.  F/U  LFT's in 6 months. Lab Results  Component Value Date   LDLCALC 80 12/09/2013

## 2013-12-16 NOTE — Assessment & Plan Note (Signed)
Needs f/u echo 6 months  Discussed possible need for TAVR in future as at current age not likely to be a redo candidate when AS becomes severe

## 2013-12-16 NOTE — Assessment & Plan Note (Signed)
Stable with no angina and good activity level.  Continue medical Rx  

## 2013-12-16 NOTE — Patient Instructions (Signed)
Your physician wants you to follow-up in:   July WITH  DR  Johnsie Cancel   ECHO  AND  CAROTID  SAME DAY  You will receive a reminder letter in the mail two months in advance. If you don't receive a letter, please call our office to schedule the follow-up appointment. Your physician recommends that you continue on your current medications as directed. Please refer to the Current Medication list given to you today. Your physician has requested that you have an ankle brachial index (ABI). During this test an ultrasound and blood pressure cuff are used to evaluate the arteries that supply the arms and legs with blood. Allow thirty minutes for this exam. There are no restrictions or special instructions.

## 2013-12-16 NOTE — Progress Notes (Signed)
Patient ID: [name] [name], male   DOB: [birth year], 80 y.o.   MRN: [id number]   80 y.o. patient previously seen by Dr Verl Blalock.  History of CAD with CABG in 05/07/2000  LIMA to LAD, Radial to PDA SVG to D1 Seq SVG OM1/OM2  EF has been normal with no angina or CHF.  Echo in 2014 showed moderate AS He has little insight into this diagnosis.  Discussed in detail as well as natural history  Study Conclusions  01/05/13  - Left ventricle: The cavity size was normal. Wall thickness was increased in a pattern of mild LVH. Systolic function was normal. The estimated ejection fraction was in the range of 60% to 65%. Wall motion was normal; there were no regional wall motion abnormalities. Doppler parameters are consistent with abnormal left ventricular relaxation (grade 1 diastolic dysfunction). - Aortic valve: Trileaflet; moderately calcified leaflets. There was moderate stenosis. Trivial regurgitation. Mean gradient: 3mm Hg (S). Peak gradient: 74mm Hg (S). Valve area: 1.44cm^2(VTI). - Mitral valve: Mildly calcified annulus. Mild regurgitation. - Left atrium: The atrium was mildly dilated. - Right ventricle: The cavity size was normal. Systolic function was normal. - Tricuspid valve: Peak RV-RA gradient: 67mm Hg (S). - Pulmonary arteries: PA peak pressure: 39mm Hg (S). - Inferior vena cava: The vessel was normal in size; the respirophasic diameter changes were in the normal range (= 50%); findings are consistent with normal central venous pressure. Impressions:  - Normal LV size with mild LV hypertrophy. EF 60-65%. Normal RV size and systolic function. Moderate aortic stenosis.  Also has known bilateral ICA stenosis 40-59% Studies reviewed and due for f/u 2022/11/22  No chest pain.  RLE pain with walking ? Claudication  More swelling in this leg since surgery.  Mild exertional dyspnea and no syncope     ROS: Denies fever, malais, weight loss, blurry vision, decreased visual acuity, cough, sputum,  SOB, hemoptysis, pleuritic pain, palpitaitons, heartburn, abdominal pain, melena, lower extremity edema, claudication, or rash.  All other systems reviewed and negative   General: Affect appropriate Healthy:  appears stated age 80: normal Neck supple with no adenopathy JVP normal bilateral  bruits no thyromegaly Lungs clear with no wheezing and good diaphragmatic motion Heart:  S1/S2 AS murmur,rub, gallop or click PMI normal Abdomen: benighn, BS positve, no tenderness, no AAA no bruit.  No HSM or HJR Distal pulses hard to feel right PT/DP  Radial harvest on left arm  No edema Neuro non-focal Skin warm and dry No muscular weakness  Medications Current Outpatient Prescriptions  Medication Sig Dispense Refill  . amLODipine (NORVASC) 5 MG tablet Take 1 tablet (5 mg total) by mouth daily.  90 tablet  3  . aspirin 325 MG tablet Take 325 mg by mouth daily.       . Cholecalciferol (VITAMIN D3) 1000 UNITS CAPS Take 1 capsule by mouth daily.        . clopidogrel (PLAVIX) 75 MG tablet Take 1 tablet (75 mg total) by mouth daily.  90 tablet  3  . dorzolamide-timolol (COSOPT) 22.3-6.8 MG/ML ophthalmic solution       . ezetimibe (ZETIA) 10 MG tablet Take 1 tablet (10 mg total) by mouth daily.  90 tablet  3  . fluticasone (FLONASE) 50 MCG/ACT nasal spray Place 2 sprays into the nose daily.  16 g  6  . isosorbide mononitrate (IMDUR) 60 MG 24 hr tablet Take 1 tablet (60 mg total) by mouth daily.  90 tablet  3  . losartan (  COZAAR) 100 MG tablet Take 1 tablet (100 mg total) by mouth daily.  90 tablet  3  . metoprolol-hydrochlorothiazide (LOPRESSOR HCT) 100-25 MG per tablet Take 1 tablet by mouth daily.  90 tablet  3  . nitroGLYCERIN (NITROSTAT) 0.4 MG SL tablet Place 1 tablet (0.4 mg total) under the tongue every 5 (five) minutes as needed. For chest pain  20 tablet  3  . Polyethyl Glycol-Propyl Glycol (SYSTANE OP) Place 1 drop into both eyes as needed. For dry eyes      . prednisoLONE acetate  (PRED FORTE) 1 % ophthalmic suspension       . PrednisoLONE Sodium Phosphate (PREDNISOL OP) Place 1 drop into the left eye 4 (four) times daily.      . rosuvastatin (CRESTOR) 40 MG tablet Take 0.5 tablets (20 mg total) by mouth daily.  90 tablet  3  . travoprost, benzalkonium, (TRAVATAN) 0.004 % ophthalmic solution Place 1 drop into both eyes at bedtime.        No current facility-administered medications for this visit.    Allergies Benazepril hcl; Cosopt; Ezetimibe-simvastatin; and Lipitor  Family History: Family History  Problem Relation Age of Onset  . Breast cancer Mother   . Cancer Mother     breast  . Heart disease Father     Social History: History   Social History  . Marital Status: Divorced    Spouse Name: N/A    Number of Children: 3  . Years of Education: N/A   Occupational History  .     Social History Main Topics  . Smoking status: Former Smoker -- 0.75 packs/day for 25 years  . Smokeless tobacco: Never Used     Comment: quit by the time he was 80 yrs old  . Alcohol Use: Yes     Comment: infrequent glass of wine  . Drug Use: No  . Sexual Activity: Not Currently   Other Topics Concern  . Not on file   Social History Narrative  . No narrative on file    Electrocardiogram:  SR RBBB inferolateral T wave changes no change compared to 12/11/12  Assessment and Plan

## 2013-12-16 NOTE — Assessment & Plan Note (Signed)
Chronic no high grade AV block  Yearly ECG 

## 2013-12-16 NOTE — Assessment & Plan Note (Signed)
Related to previous SVG harvest  Low sodium diet / diuretic

## 2013-12-22 ENCOUNTER — Other Ambulatory Visit (HOSPITAL_COMMUNITY): Payer: Self-pay

## 2013-12-22 ENCOUNTER — Ambulatory Visit (HOSPITAL_COMMUNITY): Payer: Medicare Other | Attending: Cardiovascular Disease | Admitting: Cardiology

## 2013-12-22 DIAGNOSIS — I70219 Atherosclerosis of native arteries of extremities with intermittent claudication, unspecified extremity: Secondary | ICD-10-CM | POA: Insufficient documentation

## 2013-12-22 DIAGNOSIS — I2510 Atherosclerotic heart disease of native coronary artery without angina pectoris: Secondary | ICD-10-CM | POA: Insufficient documentation

## 2013-12-22 DIAGNOSIS — I10 Essential (primary) hypertension: Secondary | ICD-10-CM | POA: Insufficient documentation

## 2013-12-22 DIAGNOSIS — M79609 Pain in unspecified limb: Secondary | ICD-10-CM | POA: Diagnosis not present

## 2013-12-22 DIAGNOSIS — I739 Peripheral vascular disease, unspecified: Secondary | ICD-10-CM

## 2013-12-22 DIAGNOSIS — E785 Hyperlipidemia, unspecified: Secondary | ICD-10-CM | POA: Insufficient documentation

## 2013-12-22 DIAGNOSIS — E1159 Type 2 diabetes mellitus with other circulatory complications: Secondary | ICD-10-CM

## 2013-12-22 DIAGNOSIS — Z951 Presence of aortocoronary bypass graft: Secondary | ICD-10-CM | POA: Diagnosis not present

## 2013-12-22 DIAGNOSIS — M79669 Pain in unspecified lower leg: Secondary | ICD-10-CM

## 2013-12-22 NOTE — Progress Notes (Signed)
LEA Doppler/ABI and Duplex performed

## 2013-12-25 ENCOUNTER — Telehealth: Payer: Self-pay | Admitting: Cardiovascular Disease

## 2013-12-25 NOTE — Telephone Encounter (Signed)
New problem   Pt want results of his arterial lower test. Please call pt.

## 2013-12-25 NOTE — Telephone Encounter (Signed)
Spoke with pt and reviewed doppler studies with him.   Appt made for pt to see Dr. Fletcher Anon on December 29, 2013 at 11:00.

## 2013-12-29 ENCOUNTER — Ambulatory Visit (INDEPENDENT_AMBULATORY_CARE_PROVIDER_SITE_OTHER): Payer: Medicare Other | Admitting: Cardiovascular Disease

## 2013-12-29 ENCOUNTER — Encounter: Payer: Self-pay | Admitting: Cardiovascular Disease

## 2013-12-29 VITALS — BP 134/60 | HR 66 | Ht 67.0 in | Wt 167.1 lb

## 2013-12-29 DIAGNOSIS — I739 Peripheral vascular disease, unspecified: Secondary | ICD-10-CM

## 2013-12-29 DIAGNOSIS — I2510 Atherosclerotic heart disease of native coronary artery without angina pectoris: Secondary | ICD-10-CM | POA: Diagnosis not present

## 2013-12-29 DIAGNOSIS — E785 Hyperlipidemia, unspecified: Secondary | ICD-10-CM

## 2013-12-29 NOTE — Assessment & Plan Note (Signed)
The patient overall has mild non-lifestyle limiting calf claudication due to SFA disease bilaterally with mildly reduced ABI. I discussed with him the natural history and management of PAD. Given that his symptoms are currently not lifestyle limiting, I recommend continuing medical therapy. The chance of progression to critical limb ischemia is overall small given that he is not diabetic and does not smoke. He is currently on optimal medical therapy. I will have him followup with me in 6 months or earlier if needed.

## 2013-12-29 NOTE — Progress Notes (Signed)
HPI  Primary cardiologist: Dr. Johnsie Cancel  This is a 80 yo male who was referred by Dr. Johnsie Cancel for evaluation and management of PAD. patient previously seen by Dr Verl Blalock.  History of CAD with CABG in 05/07/2000.  EF has been normal with no angina or CHF.  Echo in 2014 showed moderate AS. Also has known bilateral ICA stenosis 40-59% Studies.  No chest pain.   He complains of bilateral calf pain with walking about 3 or 4 blocks. The symptoms are worse on the right side than the left side his physical activities are somewhat limited due to chronic hip pain due to arthritis. He uses a cane on a regular basis. He wears a lower back brace. He has no history of diabetes and he is not a smoker.  Recent noninvasive vascular evaluation showed an ABI of 0.77 on right and 0.86 on left with evidence of inflow disease based on waveforms. Duplex showed borderline significant bilateral SFA disease.   Allergies  Allergen Reactions  . Benazepril Hcl     REACTION: cough  . Cosopt [Dorzolamide Hcl-Timolol Mal] Other (See Comments)    Eyes water  . Ezetimibe-Simvastatin     REACTION: aches  . Lipitor [Atorvastatin] Other (See Comments)    Muscle aches     Current Outpatient Prescriptions on File Prior to Visit  Medication Sig Dispense Refill  . amLODipine (NORVASC) 5 MG tablet Take 1 tablet (5 mg total) by mouth daily.  90 tablet  3  . aspirin 325 MG tablet Take 325 mg by mouth daily.       . Cholecalciferol (VITAMIN D3) 1000 UNITS CAPS Take 1 capsule by mouth daily.        . clopidogrel (PLAVIX) 75 MG tablet Take 1 tablet (75 mg total) by mouth daily.  90 tablet  3  . dorzolamide-timolol (COSOPT) 22.3-6.8 MG/ML ophthalmic solution       . ezetimibe (ZETIA) 10 MG tablet Take 1 tablet (10 mg total) by mouth daily.  90 tablet  3  . fluticasone (FLONASE) 50 MCG/ACT nasal spray Place 2 sprays into the nose daily.  16 g  6  . isosorbide mononitrate (IMDUR) 60 MG 24 hr tablet Take 1 tablet (60 mg total) by mouth  daily.  90 tablet  3  . losartan (COZAAR) 100 MG tablet Take 1 tablet (100 mg total) by mouth daily.  90 tablet  3  . metoprolol-hydrochlorothiazide (LOPRESSOR HCT) 100-25 MG per tablet Take 1 tablet by mouth daily.  90 tablet  3  . nitroGLYCERIN (NITROSTAT) 0.4 MG SL tablet Place 1 tablet (0.4 mg total) under the tongue every 5 (five) minutes as needed. For chest pain  20 tablet  3  . Polyethyl Glycol-Propyl Glycol (SYSTANE OP) Place 1 drop into both eyes as needed. For dry eyes      . prednisoLONE acetate (PRED FORTE) 1 % ophthalmic suspension       . PrednisoLONE Sodium Phosphate (PREDNISOL OP) Place 1 drop into the left eye 4 (four) times daily.      . rosuvastatin (CRESTOR) 40 MG tablet Take 0.5 tablets (20 mg total) by mouth daily.  90 tablet  3  . travoprost, benzalkonium, (TRAVATAN) 0.004 % ophthalmic solution Place 1 drop into both eyes at bedtime.        No current facility-administered medications on file prior to visit.     Past Medical History  Diagnosis Date  . Peripheral vascular disease   . Carotid artery disease   .  Hyperlipidemia   . Glucose intolerance (impaired glucose tolerance)   . DJD (degenerative joint disease)     right hip  . Low back pain     facet arthropathy  . Hx of colonic polyps 2004    hyperplastic  . Glaucoma   . Herpes zoster 2009    r. scalp and opth  . Postherpetic neuralgia 2009  . Pneumonia 2009    rll with a small effusion  . Lumbago   . Coronary artery disease     sees Dr. Verl Blalock  . Hypertension     sees Dr. Adelene Amas  . GERD (gastroesophageal reflux disease)     hx of  . Headache(784.0)     hx of migraines     Past Surgical History  Procedure Laterality Date  . Coronary artery disease      stent 2010  . Carpal tunnel release      left  . Thumb tendon surgery      left  . Acne cyst removal  1970's    removed x2 off the back  . Coronary artery bypass graft  2002  . Tonsillectomy      age 10  . Cataract extraction w/phaco   02/13/2012    Procedure: CATARACT EXTRACTION PHACO AND INTRAOCULAR LENS PLACEMENT (IOC);  Surgeon: Adonis Brook, MD;  Location: [facility];  Service: Ophthalmology;  Laterality: Left;  . Reposition of lens  03/14/2012    Procedure: REPOSITION OF LENS;  Surgeon: Adonis Brook, MD;  Location: [facility];  Service: Ophthalmology;  Laterality: Left;  Reposition Ocular Lens Left Eye     Family History  Problem Relation Age of Onset  . Breast cancer Mother   . Cancer Mother     breast  . Heart disease Father      History   Social History  . Marital Status: Divorced    Spouse Name: N/A    Number of Children: 3  . Years of Education: N/A   Occupational History  .     Social History Main Topics  . Smoking status: Former Smoker -- 0.75 packs/day for 25 years  . Smokeless tobacco: Never Used     Comment: quit by the time he was 80 yrs old  . Alcohol Use: Yes     Comment: infrequent glass of wine  . Drug Use: No  . Sexual Activity: Not Currently   Other Topics Concern  . Not on file   Social History Narrative  . No narrative on file     ROS A 10 point review of system was performed. It is negative other than that mentioned in the history of present illness.   PHYSICAL EXAM   BP 134/60  Pulse 66  Ht 5\' 7"  (1.702 m)  Wt 167 lb 1.9 oz (75.805 kg)  BMI 26.17 kg/m2  SpO2 99% Constitutional: He is oriented to person, place, and time. He appears well-developed and well-nourished. No distress.  HENT: No nasal discharge.  Head: Normocephalic and atraumatic.  Eyes: Pupils are equal and round.  No discharge. Neck: Normal range of motion. Neck supple. No JVD present. No thyromegaly present.  Cardiovascular: Normal rate, regular rhythm, normal heart sounds. Exam reveals no gallop and no friction rub. There is a 3/6 systolic late peaking murmur with diminished S2. Pulmonary/Chest: Effort normal and breath sounds normal. No stridor. No respiratory distress. He has no wheezes. He has no  rales. He exhibits no tenderness.  Abdominal: Soft. Bowel sounds are normal. He exhibits  no distension. There is no tenderness. There is no rebound and no guarding.  Musculoskeletal: Normal range of motion. He exhibits no edema and no tenderness.  Neurological: He is alert and oriented to person, place, and time. Coordination normal.  Skin: Skin is warm and dry. No rash noted. He is not diaphoretic. No erythema. No pallor.  Psychiatric: He has a normal mood and affect. His behavior is normal. Judgment and thought content normal.  Vascular: Radial pulse is normal on the right side. Left radial artery was harvested for previous bypass. Femoral pulses are normal bilaterally. Distal pulses are not palpable.       ASSESSMENT AND PLAN

## 2013-12-29 NOTE — Assessment & Plan Note (Signed)
Lab Results  Component Value Date   CHOL 149 12/09/2013   HDL 58.30 12/09/2013   LDLCALC 80 12/09/2013        TRIG 53.0 12/09/2013   CHOLHDL 3 12/09/2013

## 2013-12-29 NOTE — Patient Instructions (Signed)
Your physician wants you to follow-up in: 6 months with Dr Fletcher Anon (March 2016). You will receive a reminder letter in the mail two months in advance. If you don't receive a letter, please call our office to schedule the follow-up appointment.

## 2014-02-17 ENCOUNTER — Encounter: Payer: Self-pay | Admitting: Podiatry

## 2014-02-17 ENCOUNTER — Ambulatory Visit (INDEPENDENT_AMBULATORY_CARE_PROVIDER_SITE_OTHER): Payer: Medicare Other | Admitting: Podiatry

## 2014-02-17 VITALS — BP 120/54 | HR 62

## 2014-02-17 DIAGNOSIS — M79606 Pain in leg, unspecified: Secondary | ICD-10-CM

## 2014-02-17 DIAGNOSIS — B351 Tinea unguium: Secondary | ICD-10-CM

## 2014-02-17 NOTE — Patient Instructions (Signed)
Seen for hypertrophic nails. All nails debrided. Return in 3 months or as needed.  

## 2014-02-17 NOTE — Progress Notes (Signed)
Subjective:  80 y.o. year old male patient presents complaining of painful nails. Patient requests toe nails trimmed.   Objective:  Pedal pulses not palpable, bilateral.  Thick mycotic nails x 9 (2nd right missing).  Hallux valgus with bunion on left.  All epicritic and tactile sensations grossly intact.  No acute or abnormal skin lesions noted.   Assessment:  Mycotic nails x 9.  PVD.  Venous insufficiency.   Plan:  All nails debrided.

## 2014-03-15 ENCOUNTER — Encounter: Payer: Self-pay | Admitting: Internal Medicine

## 2014-03-15 ENCOUNTER — Ambulatory Visit (INDEPENDENT_AMBULATORY_CARE_PROVIDER_SITE_OTHER): Payer: Medicare Other | Admitting: Internal Medicine

## 2014-03-15 VITALS — BP 110/60 | HR 59 | Temp 98.6°F | Resp 12 | Ht 68.0 in | Wt 169.4 lb

## 2014-03-15 DIAGNOSIS — M5440 Lumbago with sciatica, unspecified side: Secondary | ICD-10-CM

## 2014-03-15 DIAGNOSIS — I2510 Atherosclerotic heart disease of native coronary artery without angina pectoris: Secondary | ICD-10-CM | POA: Diagnosis not present

## 2014-03-15 DIAGNOSIS — I350 Nonrheumatic aortic (valve) stenosis: Secondary | ICD-10-CM | POA: Diagnosis not present

## 2014-03-15 DIAGNOSIS — E785 Hyperlipidemia, unspecified: Secondary | ICD-10-CM

## 2014-03-15 NOTE — Progress Notes (Signed)
Pre visit review using our clinic review tool, if applicable. No additional management support is needed unless otherwise documented below in the visit note. 

## 2014-03-15 NOTE — Assessment & Plan Note (Signed)
Continue with current prescription therapy as reflected on the Med list.  

## 2014-03-15 NOTE — Progress Notes (Signed)
   Subjective:     HPI   The patient presents for a follow-up of chronic dyslipidemia, CAD, elev glu controlled with medicines and HTN.  F/u R hand CTS - doing ok F/u dry skin - better C/o R knee pain at times   Wt Readings from Last 3 Encounters:  03/15/14 169 lb 6.4 oz (76.839 kg)  12/29/13 167 lb 1.9 oz (75.805 kg)  12/16/13 167 lb (75.751 kg)   BP Readings from Last 3 Encounters:  03/15/14 110/60  02/17/14 120/54  12/29/13 134/60      Review of Systems  Constitutional: Negative for appetite change, fatigue and unexpected weight change.  HENT: Negative for congestion, nosebleeds, sneezing, sore throat and trouble swallowing.   Eyes: Negative for itching and visual disturbance.  Respiratory: Negative for cough.   Cardiovascular: Negative for chest pain, palpitations and leg swelling.  Gastrointestinal: Negative for nausea, diarrhea, blood in stool and abdominal distention.  Genitourinary: Negative for frequency and hematuria.  Musculoskeletal: Negative for back pain, joint swelling, gait problem and neck pain.  Skin: Negative for rash.  Neurological: Negative for dizziness, tremors, speech difficulty and weakness.  Psychiatric/Behavioral: Negative for sleep disturbance, dysphoric mood and agitation. The patient is not nervous/anxious.        Objective:   Physical Exam  Constitutional: He is oriented to person, place, and time. He appears well-developed. No distress.  NAD  HENT:  Mouth/Throat: Oropharynx is clear and moist.  Eyes: Conjunctivae are normal. Pupils are equal, round, and reactive to light.  Neck: Normal range of motion. No JVD present. No thyromegaly present.  Cardiovascular: Normal rate, regular rhythm, normal heart sounds and intact distal pulses.  Exam reveals no gallop and no friction rub.   No murmur heard. Pulmonary/Chest: Effort normal and breath sounds normal. No respiratory distress. He has no wheezes. He has no rales. He exhibits no  tenderness.  Abdominal: Soft. Bowel sounds are normal. He exhibits no distension and no mass. There is no tenderness. There is no rebound and no guarding.  Musculoskeletal: Normal range of motion. He exhibits no edema or tenderness.  Lymphadenopathy:    He has no cervical adenopathy.  Neurological: He is alert and oriented to person, place, and time. He has normal reflexes. No cranial nerve deficit. He exhibits normal muscle tone. He displays a negative Romberg sign. Coordination and gait normal.  No meningeal signs  Skin: Skin is warm and dry. No rash noted.  Psychiatric: He has a normal mood and affect. His behavior is normal. Judgment and thought content normal.  B shins are brownish L>R No edema    Lab Results  Component Value Date   WBC 7.8 09/04/2013   HGB 12.6* 09/04/2013   HCT 37.2* 09/04/2013   PLT 220.0 09/04/2013   GLUCOSE 89 12/09/2013   CHOL 149 12/09/2013   TRIG 53.0 12/09/2013   HDL 58.30 12/09/2013   LDLDIRECT 136.2 09/28/2009   LDLCALC 80 12/09/2013   ALT 56* 12/09/2013   AST 61* 12/09/2013   NA 135 12/09/2013   K 3.8 12/09/2013   CL 105 12/09/2013   CREATININE 1.2 12/09/2013   BUN 19 12/09/2013   CO2 21 12/09/2013   TSH 0.81 12/09/2013   PSA 0.40 09/04/2013   INR 1.1 ratio* 10/06/2009   HGBA1C 5.6 12/09/2013          Assessment & Plan:

## 2014-03-15 NOTE — Assessment & Plan Note (Signed)
MSK/OA Continue with current prn therapy as reflected on the Med list.

## 2014-03-16 ENCOUNTER — Other Ambulatory Visit (INDEPENDENT_AMBULATORY_CARE_PROVIDER_SITE_OTHER): Payer: Medicare Other

## 2014-03-16 DIAGNOSIS — M5440 Lumbago with sciatica, unspecified side: Secondary | ICD-10-CM | POA: Diagnosis not present

## 2014-03-16 DIAGNOSIS — I2510 Atherosclerotic heart disease of native coronary artery without angina pectoris: Secondary | ICD-10-CM

## 2014-03-16 DIAGNOSIS — I350 Nonrheumatic aortic (valve) stenosis: Secondary | ICD-10-CM | POA: Diagnosis not present

## 2014-03-16 DIAGNOSIS — E785 Hyperlipidemia, unspecified: Secondary | ICD-10-CM

## 2014-03-17 LAB — BASIC METABOLIC PANEL
BUN: 21 mg/dL (ref 6–23)
CALCIUM: 9.3 mg/dL (ref 8.4–10.5)
CO2: 23 meq/L (ref 19–32)
Chloride: 109 mEq/L (ref 96–112)
Creatinine, Ser: 1.2 mg/dL (ref 0.4–1.5)
GFR: 76.52 mL/min (ref >60.00)
GLUCOSE: 84 mg/dL (ref 70–99)
Potassium: 4.5 mEq/L (ref 3.5–5.1)
Sodium: 139 mEq/L (ref 135–145)

## 2014-03-17 LAB — HEPATIC FUNCTION PANEL
ALT: 50 U/L (ref 0–53)
AST: 52 U/L — AB (ref 0–37)
Albumin: 4 g/dL (ref 3.5–5.2)
Alkaline Phosphatase: 65 U/L (ref 39–117)
BILIRUBIN DIRECT: 0.2 mg/dL (ref 0.0–0.3)
BILIRUBIN TOTAL: 1.2 mg/dL (ref 0.2–1.2)
Total Protein: 6.6 g/dL (ref 6.0–8.3)

## 2014-04-21 ENCOUNTER — Telehealth: Payer: Self-pay | Admitting: Internal Medicine

## 2014-04-21 NOTE — Telephone Encounter (Signed)
Pt constipated 1/4 took miralax now very loose stools. Pt had to start using depends for freq accidents last day or so. Pls advise.  [phone] or [phone]

## 2014-04-22 NOTE — Telephone Encounter (Signed)
Notified pt with md response.../lmb 

## 2014-04-22 NOTE — Telephone Encounter (Signed)
Try Imodium AD (OTC) prn Thx

## 2014-04-23 ENCOUNTER — Encounter: Payer: Self-pay | Admitting: Cardiovascular Disease

## 2014-05-21 ENCOUNTER — Encounter: Payer: Self-pay | Admitting: Podiatry

## 2014-05-21 ENCOUNTER — Ambulatory Visit (INDEPENDENT_AMBULATORY_CARE_PROVIDER_SITE_OTHER): Payer: Medicare Other | Admitting: Podiatry

## 2014-05-21 VITALS — BP 146/60 | HR 67

## 2014-05-21 DIAGNOSIS — M79606 Pain in leg, unspecified: Secondary | ICD-10-CM

## 2014-05-21 DIAGNOSIS — B351 Tinea unguium: Secondary | ICD-10-CM

## 2014-05-21 NOTE — Progress Notes (Signed)
Subjective:  81 y.o. year old male patient presents complaining of painful nails. He is wearing light compression socks. Patient requests toe nails trimmed.   Objective:  Vascular: Severe lower limb edema R>L. Positive of blister formation on right anterior shin. Dermatologic: Pedal pulses not palpable, bilateral.  Thick mycotic nails x 9 (2nd right missing).  Orthopedic: Hallux valgus with bunion on left.  Neurologic: All epicritic and tactile sensations grossly intact.   Assessment:  Mycotic nails x 9.  PVD.  Venous insufficiency.   Plan:  All nails debrided. Advised to wear compression socks.  Patient has an appointment to see his cardiovascular doctor.

## 2014-05-21 NOTE — Patient Instructions (Signed)
Seen for hypertrophic nails. All nails debrided. Return in 3 months or as needed.  

## 2014-06-29 ENCOUNTER — Encounter: Payer: Self-pay | Admitting: Cardiovascular Disease

## 2014-06-29 ENCOUNTER — Ambulatory Visit (INDEPENDENT_AMBULATORY_CARE_PROVIDER_SITE_OTHER): Payer: Medicare Other | Admitting: Cardiovascular Disease

## 2014-06-29 VITALS — BP 124/62 | HR 68 | Ht 68.0 in | Wt 166.2 lb

## 2014-06-29 DIAGNOSIS — I739 Peripheral vascular disease, unspecified: Secondary | ICD-10-CM

## 2014-06-29 NOTE — Patient Instructions (Signed)
Your physician recommends that you continue on your current medications as directed. Please refer to the Current Medication list given to you today.  Your physician recommends that you schedule a follow-up appointment as needed with Dr. Arida  

## 2014-06-29 NOTE — Progress Notes (Signed)
HPI  Primary cardiologist: Dr. Johnsie Cancel  This is a 81 yo male who  Is here today for a follow-up visit regarding PAD. History of CAD with CABG in 05/07/2000.  EF has been normal with no angina or CHF.  Echo in 2014 showed moderate AS. Also has known bilateral ICA stenosis 40-59% Studies.  No chest pain.   He was seen for bilateral calf pain with walking about 3 or 4 blocks worse on the right side.  Limited physical activities due to chronic hip pain due to arthritis. He uses a cane on a regular basis. He wears a lower back brace. He has no history of diabetes and he is not a smoker.   noninvasive vascular evaluation showed an ABI of 0.77 on right and 0.86 on left with evidence of inflow disease based on waveforms. Duplex showed borderline significant bilateral SFA disease. I recommended medical therapy.   he is actually reporting improved symptoms and currently denies calf claudication  Allergies  Allergen Reactions  . Benazepril Hcl Cough  . Cosopt [Dorzolamide Hcl-Timolol Mal] Other (See Comments)    Eyes water  . Ezetimibe-Simvastatin Other (See Comments)    REACTION: aches  . Lipitor [Atorvastatin] Other (See Comments)    Muscle aches     Current Outpatient Prescriptions on File Prior to Visit  Medication Sig Dispense Refill  . amLODipine (NORVASC) 5 MG tablet Take 1 tablet (5 mg total) by mouth daily. 90 tablet 3  . aspirin 325 MG tablet Take 325 mg by mouth daily.     . Cholecalciferol (VITAMIN D3) 1000 UNITS CAPS Take 1 capsule by mouth daily.      . clopidogrel (PLAVIX) 75 MG tablet Take 1 tablet (75 mg total) by mouth daily. 90 tablet 3  . ezetimibe (ZETIA) 10 MG tablet Take 1 tablet (10 mg total) by mouth daily. 90 tablet 3  . isosorbide mononitrate (IMDUR) 60 MG 24 hr tablet Take 1 tablet (60 mg total) by mouth daily. 90 tablet 3  . losartan (COZAAR) 100 MG tablet Take 1 tablet (100 mg total) by mouth daily. 90 tablet 3  . metoprolol-hydrochlorothiazide (LOPRESSOR HCT)  100-25 MG per tablet Take 1 tablet by mouth daily. 90 tablet 3  . nitroGLYCERIN (NITROSTAT) 0.4 MG SL tablet Place 1 tablet (0.4 mg total) under the tongue every 5 (five) minutes as needed. For chest pain 20 tablet 3  . Polyethyl Glycol-Propyl Glycol (SYSTANE OP) Place 1 drop into both eyes as needed. For dry eyes    . prednisoLONE acetate (PRED FORTE) 1 % ophthalmic suspension     . PrednisoLONE Sodium Phosphate (PREDNISOL OP) Place 1 drop into the left eye as needed.     . rosuvastatin (CRESTOR) 40 MG tablet Take 0.5 tablets (20 mg total) by mouth daily. 90 tablet 3  . travoprost, benzalkonium, (TRAVATAN) 0.004 % ophthalmic solution Place 1 drop into both eyes at bedtime.      No current facility-administered medications on file prior to visit.     Past Medical History  Diagnosis Date  . Peripheral vascular disease   . Carotid artery disease   . Hyperlipidemia   . Glucose intolerance (impaired glucose tolerance)   . DJD (degenerative joint disease)     right hip  . Low back pain     facet arthropathy  . Hx of colonic polyps 2004    hyperplastic  . Glaucoma   . Herpes zoster 2009    r. scalp and opth  . Postherpetic  neuralgia 2009  . Pneumonia 2009    rll with a small effusion  . Lumbago   . Coronary artery disease     sees Dr. Verl Blalock  . Hypertension     sees Dr. Adelene Amas  . GERD (gastroesophageal reflux disease)     hx of  . Headache(784.0)     hx of migraines     Past Surgical History  Procedure Laterality Date  . Coronary artery disease      stent 2010  . Carpal tunnel release      left  . Thumb tendon surgery      left  . Acne cyst removal  1970's    removed x2 off the back  . Coronary artery bypass graft  2002  . Tonsillectomy      age 8  . Cataract extraction w/phaco  02/13/2012    Procedure: CATARACT EXTRACTION PHACO AND INTRAOCULAR LENS PLACEMENT (IOC);  Surgeon: Adonis Brook, MD;  Location: [facility];  Service: Ophthalmology;  Laterality: Left;  .  Reposition of lens  03/14/2012    Procedure: REPOSITION OF LENS;  Surgeon: Adonis Brook, MD;  Location: [facility];  Service: Ophthalmology;  Laterality: Left;  Reposition Ocular Lens Left Eye     Family History  Problem Relation Age of Onset  . Breast cancer Mother   . Cancer Mother     breast  . Heart disease Father      History   Social History  . Marital Status: Divorced    Spouse Name: N/A  . Number of Children: 3  . Years of Education: N/A   Occupational History  .     Social History Main Topics  . Smoking status: Former Smoker -- 0.75 packs/day for 25 years  . Smokeless tobacco: Never Used     Comment: quit by the time he was 81 yrs old  . Alcohol Use: 0.0 oz/week    0 Standard drinks or equivalent per week     Comment: infrequent glass of wine  . Drug Use: No  . Sexual Activity: Not Currently   Other Topics Concern  . Not on file   Social History Narrative     ROS A 10 point review of system was performed. It is negative other than that mentioned in the history of present illness.   PHYSICAL EXAM   BP 124/62 mmHg  Pulse 68  Ht 5\' 8"  (1.727 m)  Wt 166 lb 3.2 oz (75.388 kg)  BMI 25.28 kg/m2 Constitutional: He is oriented to person, place, and time. He appears well-developed and well-nourished. No distress.  HENT: No nasal discharge.  Head: Normocephalic and atraumatic.  Eyes: Pupils are equal and round.  No discharge. Neck: Normal range of motion. Neck supple. No JVD present. No thyromegaly present.  Cardiovascular: Normal rate, regular rhythm, normal heart sounds. Exam reveals no gallop and no friction rub. There is a 3/6 systolic late peaking murmur with diminished S2. Pulmonary/Chest: Effort normal and breath sounds normal. No stridor. No respiratory distress. He has no wheezes. He has no rales. He exhibits no tenderness.  Abdominal: Soft. Bowel sounds are normal. He exhibits no distension. There is no tenderness. There is no rebound and no guarding.    Musculoskeletal: Normal range of motion. He exhibits no edema and no tenderness.  Neurological: He is alert and oriented to person, place, and time. Coordination normal.  Skin: Skin is warm and dry. No rash noted. He is not diaphoretic. No erythema. No pallor.  Psychiatric:  He has a normal mood and affect. His behavior is normal. Judgment and thought content normal.  Vascular: Radial pulse is normal on the right side. Left radial artery was harvested for previous bypass. Femoral pulses are normal bilaterally. Distal pulses are not palpable.       ASSESSMENT AND PLAN

## 2014-06-29 NOTE — Assessment & Plan Note (Signed)
The patient overall has mild non-lifestyle limiting calf claudication due to SFA disease bilaterally with mildly reduced ABI.  He has actually reports resolution of calf discomfort. In the absence of symptoms, there is no indication for revascularization.  I recommend continued medical therapy. I discussed with him the importance of continued exercise and walking program.  He can follow-up with me as needed

## 2014-07-06 ENCOUNTER — Other Ambulatory Visit: Payer: Self-pay | Admitting: Internal Medicine

## 2014-07-08 ENCOUNTER — Telehealth: Payer: Self-pay | Admitting: Internal Medicine

## 2014-07-08 MED ORDER — ISOSORBIDE MONONITRATE ER 60 MG PO TB24: 60.0000 mg | ORAL_TABLET | Freq: Every day | ORAL | Status: DC

## 2014-07-08 NOTE — Telephone Encounter (Signed)
Pt called in and wanted to know if he can get 30 day supple of     isosorbide mononitrate (IMDUR) 60 MG 24 hr tablet [[id number]]       isosorbide mononitrate (IMDUR) 60 MG 24 hr tablet [[id number]]     Till mail order comes in.  He is completely out of this med.  He is coming in next week for fu  [facility] on [address]

## 2014-07-08 NOTE — Telephone Encounter (Signed)
Done. Pt informed.

## 2014-07-14 ENCOUNTER — Encounter: Payer: Self-pay | Admitting: Internal Medicine

## 2014-07-14 ENCOUNTER — Ambulatory Visit (INDEPENDENT_AMBULATORY_CARE_PROVIDER_SITE_OTHER): Payer: Medicare Other | Admitting: Internal Medicine

## 2014-07-14 VITALS — BP 128/64 | HR 63 | Temp 98.0°F | Wt 160.0 lb

## 2014-07-14 DIAGNOSIS — I739 Peripheral vascular disease, unspecified: Secondary | ICD-10-CM | POA: Diagnosis not present

## 2014-07-14 DIAGNOSIS — I2510 Atherosclerotic heart disease of native coronary artery without angina pectoris: Secondary | ICD-10-CM

## 2014-07-14 DIAGNOSIS — Z0000 Encounter for general adult medical examination without abnormal findings: Secondary | ICD-10-CM

## 2014-07-14 DIAGNOSIS — M5440 Lumbago with sciatica, unspecified side: Secondary | ICD-10-CM

## 2014-07-14 DIAGNOSIS — N320 Bladder-neck obstruction: Secondary | ICD-10-CM

## 2014-07-14 DIAGNOSIS — N185 Chronic kidney disease, stage 5: Secondary | ICD-10-CM

## 2014-07-14 DIAGNOSIS — N182 Chronic kidney disease, stage 2 (mild): Secondary | ICD-10-CM

## 2014-07-14 DIAGNOSIS — N189 Chronic kidney disease, unspecified: Secondary | ICD-10-CM

## 2014-07-14 DIAGNOSIS — Z23 Encounter for immunization: Secondary | ICD-10-CM

## 2014-07-14 DIAGNOSIS — N181 Chronic kidney disease, stage 1: Secondary | ICD-10-CM

## 2014-07-14 DIAGNOSIS — I130 Hypertensive heart and chronic kidney disease with heart failure and stage 1 through stage 4 chronic kidney disease, or unspecified chronic kidney disease: Secondary | ICD-10-CM

## 2014-07-14 DIAGNOSIS — I872 Venous insufficiency (chronic) (peripheral): Secondary | ICD-10-CM | POA: Diagnosis not present

## 2014-07-14 DIAGNOSIS — N183 Chronic kidney disease, stage 3 (moderate): Secondary | ICD-10-CM

## 2014-07-14 DIAGNOSIS — N184 Chronic kidney disease, stage 4 (severe): Secondary | ICD-10-CM

## 2014-07-14 MED ORDER — NITROGLYCERIN 0.4 MG SL SUBL
0.4000 mg | SUBLINGUAL_TABLET | SUBLINGUAL | Status: DC | PRN
Start: 2014-07-14 — End: 2018-05-12

## 2014-07-14 NOTE — Assessment & Plan Note (Signed)
Chronic stable Crestor, Zetia, ASA

## 2014-07-14 NOTE — Assessment & Plan Note (Signed)
Better after wt loss 

## 2014-07-14 NOTE — Assessment & Plan Note (Addendum)
Chronic stable angina - rare Crestor, Zetia, ASA, Plavix

## 2014-07-14 NOTE — Progress Notes (Signed)
Pre visit review using our clinic review tool, if applicable. No additional management support is needed unless otherwise documented below in the visit note. 

## 2014-07-14 NOTE — Progress Notes (Signed)
   Subjective:     HPI   The patient presents for a follow-up of chronic dyslipidemia, CAD, elev glu controlled with medicines and HTN. R leg is feeling better w/more exercise  F/u R hand CTS - doing ok F/u dry skin - better F/u R knee pain at times   Wt Readings from Last 3 Encounters:  06/29/14 166 lb 3.2 oz (75.388 kg)  03/15/14 169 lb 6.4 oz (76.839 kg)  12/29/13 167 lb 1.9 oz (75.805 kg)   BP Readings from Last 3 Encounters:  06/29/14 124/62  05/21/14 146/60  03/15/14 110/60      Review of Systems  Constitutional: Negative for appetite change, fatigue and unexpected weight change.  HENT: Negative for congestion, nosebleeds, sneezing, sore throat and trouble swallowing.   Eyes: Negative for itching and visual disturbance.  Respiratory: Negative for cough.   Cardiovascular: Negative for chest pain, palpitations and leg swelling.  Gastrointestinal: Negative for nausea, diarrhea, blood in stool and abdominal distention.  Genitourinary: Negative for frequency and hematuria.  Musculoskeletal: Negative for back pain, joint swelling, gait problem and neck pain.  Skin: Negative for rash.  Neurological: Negative for dizziness, tremors, speech difficulty and weakness.  Psychiatric/Behavioral: Negative for sleep disturbance, dysphoric mood and agitation. The patient is not nervous/anxious.        Objective:   Physical Exam  Constitutional: He is oriented to person, place, and time. He appears well-developed. No distress.  NAD  HENT:  Mouth/Throat: Oropharynx is clear and moist.  Eyes: Conjunctivae are normal. Pupils are equal, round, and reactive to light.  Neck: Normal range of motion. No JVD present. No thyromegaly present.  Cardiovascular: Normal rate, regular rhythm, normal heart sounds and intact distal pulses.  Exam reveals no gallop and no friction rub.   No murmur heard. Pulmonary/Chest: Effort normal and breath sounds normal. No respiratory distress. He has no  wheezes. He has no rales. He exhibits no tenderness.  Abdominal: Soft. Bowel sounds are normal. He exhibits no distension and no mass. There is no tenderness. There is no rebound and no guarding.  Musculoskeletal: Normal range of motion. He exhibits no edema or tenderness.  Lymphadenopathy:    He has no cervical adenopathy.  Neurological: He is alert and oriented to person, place, and time. He has normal reflexes. No cranial nerve deficit. He exhibits normal muscle tone. He displays a negative Romberg sign. Coordination and gait normal.  No meningeal signs  Skin: Skin is warm and dry. No rash noted.  Psychiatric: He has a normal mood and affect. His behavior is normal. Judgment and thought content normal.  B shins are brownish L>R No edema    Lab Results  Component Value Date   WBC 7.8 09/04/2013   HGB 12.6* 09/04/2013   HCT 37.2* 09/04/2013   PLT 220.0 09/04/2013   GLUCOSE 84 03/16/2014   CHOL 149 12/09/2013   TRIG 53.0 12/09/2013   HDL 58.30 12/09/2013   LDLDIRECT 136.2 09/28/2009   LDLCALC 80 12/09/2013   ALT 50 03/16/2014   AST 52* 03/16/2014   NA 139 03/16/2014   K 4.5 03/16/2014   CL 109 03/16/2014   CREATININE 1.2 03/16/2014   BUN 21 03/16/2014   CO2 23 03/16/2014   TSH 0.81 12/09/2013   PSA 0.40 09/04/2013   INR 1.1 ratio* 10/06/2009   HGBA1C 5.6 12/09/2013          Assessment & Plan:

## 2014-07-14 NOTE — Assessment & Plan Note (Signed)
Chronic  OA/MSK Tylenol prn

## 2014-07-14 NOTE — Assessment & Plan Note (Signed)
Lopressor HCT, Losartan 

## 2014-07-20 ENCOUNTER — Telehealth: Payer: Self-pay | Admitting: Internal Medicine

## 2014-07-20 MED ORDER — ISOSORBIDE MONONITRATE ER 60 MG PO TB24: 60.0000 mg | ORAL_TABLET | Freq: Every day | ORAL | Status: DC

## 2014-07-20 MED ORDER — CLOPIDOGREL BISULFATE 75 MG PO TABS: 75.0000 mg | ORAL_TABLET | Freq: Every day | ORAL | Status: DC

## 2014-07-20 MED ORDER — LOSARTAN POTASSIUM 100 MG PO TABS: 100.0000 mg | ORAL_TABLET | Freq: Every day | ORAL | Status: DC

## 2014-07-20 NOTE — Telephone Encounter (Signed)
Notified pt refills has [facility].../lmb

## 2014-07-20 NOTE — Telephone Encounter (Signed)
Patient is requesting scripts for clopidogrel, zetia, isosorbide and losartan to be sent to [facility] on [address]

## 2014-08-02 ENCOUNTER — Other Ambulatory Visit: Payer: Self-pay | Admitting: *Deleted

## 2014-08-02 MED ORDER — ISOSORBIDE MONONITRATE ER 60 MG PO TB24: 60.0000 mg | ORAL_TABLET | Freq: Every day | ORAL | Status: DC

## 2014-08-02 MED ORDER — EZETIMIBE 10 MG PO TABS: 10.0000 mg | ORAL_TABLET | Freq: Every day | ORAL | Status: DC

## 2014-08-20 ENCOUNTER — Encounter: Payer: Self-pay | Admitting: Podiatry

## 2014-08-20 ENCOUNTER — Ambulatory Visit (INDEPENDENT_AMBULATORY_CARE_PROVIDER_SITE_OTHER): Payer: Medicare Other | Admitting: Podiatry

## 2014-08-20 VITALS — BP 168/74 | HR 99

## 2014-08-20 DIAGNOSIS — I872 Venous insufficiency (chronic) (peripheral): Secondary | ICD-10-CM

## 2014-08-20 DIAGNOSIS — B351 Tinea unguium: Secondary | ICD-10-CM

## 2014-08-20 DIAGNOSIS — M79606 Pain in leg, unspecified: Secondary | ICD-10-CM

## 2014-08-20 NOTE — Patient Instructions (Signed)
Seen for hypertrophic nails. All nails debrided. Continue with compression socks. Return in 3 months or as needed.

## 2014-08-20 NOTE — Progress Notes (Signed)
Subjective:  81 y.o. year old male patient presents complaining of painful nails. Having elevated blood pressure today.  Wearing compression socks that are helping.  Objective:  Vascular: Mild lower limb edema R>L.  Dermatologic: Pedal pulses not palpable, bilateral.  Thick mycotic nails x 9 (2nd right missing).  Orthopedic: Hallux valgus with bunion on left.  Neurologic: All epicritic and tactile sensations grossly intact.   Assessment:  Mycotic nails x 9.  PVD.  Venous insufficiency.   Plan:  All nails debrided.

## 2014-10-14 ENCOUNTER — Other Ambulatory Visit (INDEPENDENT_AMBULATORY_CARE_PROVIDER_SITE_OTHER): Payer: Medicare Other

## 2014-10-14 DIAGNOSIS — Z0000 Encounter for general adult medical examination without abnormal findings: Secondary | ICD-10-CM

## 2014-10-14 DIAGNOSIS — E785 Hyperlipidemia, unspecified: Secondary | ICD-10-CM

## 2014-10-14 DIAGNOSIS — N185 Chronic kidney disease, stage 5: Secondary | ICD-10-CM

## 2014-10-14 DIAGNOSIS — I2510 Atherosclerotic heart disease of native coronary artery without angina pectoris: Secondary | ICD-10-CM | POA: Diagnosis not present

## 2014-10-14 DIAGNOSIS — N189 Chronic kidney disease, unspecified: Secondary | ICD-10-CM

## 2014-10-14 DIAGNOSIS — N183 Chronic kidney disease, stage 3 (moderate): Secondary | ICD-10-CM

## 2014-10-14 DIAGNOSIS — I872 Venous insufficiency (chronic) (peripheral): Secondary | ICD-10-CM

## 2014-10-14 DIAGNOSIS — N320 Bladder-neck obstruction: Secondary | ICD-10-CM

## 2014-10-14 DIAGNOSIS — N184 Chronic kidney disease, stage 4 (severe): Secondary | ICD-10-CM

## 2014-10-14 DIAGNOSIS — N182 Chronic kidney disease, stage 2 (mild): Secondary | ICD-10-CM

## 2014-10-14 DIAGNOSIS — I739 Peripheral vascular disease, unspecified: Secondary | ICD-10-CM

## 2014-10-14 DIAGNOSIS — M5440 Lumbago with sciatica, unspecified side: Secondary | ICD-10-CM | POA: Diagnosis not present

## 2014-10-14 DIAGNOSIS — N181 Chronic kidney disease, stage 1: Secondary | ICD-10-CM

## 2014-10-14 DIAGNOSIS — I130 Hypertensive heart and chronic kidney disease with heart failure and stage 1 through stage 4 chronic kidney disease, or unspecified chronic kidney disease: Secondary | ICD-10-CM

## 2014-10-14 LAB — CBC WITH DIFFERENTIAL/PLATELET
BASOS PCT: 0.3 % (ref 0.0–3.0)
Basophils Absolute: 0 10*3/uL (ref 0.0–0.1)
Eosinophils Absolute: 0.4 10*3/uL (ref 0.0–0.7)
Eosinophils Relative: 5.7 % — ABNORMAL HIGH (ref 0.0–5.0)
HEMATOCRIT: 36.7 % — AB (ref 39.0–52.0)
Hemoglobin: 12.3 g/dL — ABNORMAL LOW (ref 13.0–17.0)
LYMPHS ABS: 1.8 10*3/uL (ref 0.7–4.0)
LYMPHS PCT: 29.3 % (ref 12.0–46.0)
MCHC: 33.5 g/dL (ref 30.0–36.0)
MCV: 85 fl (ref 78.0–100.0)
Monocytes Absolute: 0.4 10*3/uL (ref 0.1–1.0)
Monocytes Relative: 7.2 % (ref 3.0–12.0)
Neutro Abs: 3.6 10*3/uL (ref 1.4–7.7)
Neutrophils Relative %: 57.5 % (ref 43.0–77.0)
Platelets: 227 10*3/uL (ref 150.0–400.0)
RBC: 4.32 Mil/uL (ref 4.22–5.81)
RDW: 15 % (ref 11.5–15.5)
WBC: 6.2 10*3/uL (ref 4.0–10.5)

## 2014-10-14 LAB — HEPATIC FUNCTION PANEL
ALT: 27 U/L (ref 0–53)
AST: 37 U/L (ref 0–37)
Albumin: 3.9 g/dL (ref 3.5–5.2)
Alkaline Phosphatase: 68 U/L (ref 39–117)
BILIRUBIN DIRECT: 0.3 mg/dL (ref 0.0–0.3)
BILIRUBIN TOTAL: 1.4 mg/dL — AB (ref 0.2–1.2)
Total Protein: 6.5 g/dL (ref 6.0–8.3)

## 2014-10-14 LAB — LIPID PANEL
Cholesterol: 160 mg/dL (ref 0–200)
HDL: 52.7 mg/dL (ref >39.00)
LDL Cholesterol: 89 mg/dL (ref 0–99)
NonHDL: 107.3
TRIGLYCERIDES: 92 mg/dL (ref 0.0–149.0)
Total CHOL/HDL Ratio: 3
VLDL: 18.4 mg/dL (ref 0.0–40.0)

## 2014-10-14 LAB — BASIC METABOLIC PANEL
BUN: 22 mg/dL (ref 6–23)
CO2: 25 mEq/L (ref 19–32)
Calcium: 9.3 mg/dL (ref 8.4–10.5)
Chloride: 106 mEq/L (ref 96–112)
Creatinine, Ser: 1.25 mg/dL (ref 0.40–1.50)
GFR: 71.5 mL/min (ref >60.00)
Glucose, Bld: 100 mg/dL — ABNORMAL HIGH (ref 70–99)
Potassium: 3.8 mEq/L (ref 3.5–5.1)
SODIUM: 139 meq/L (ref 135–145)

## 2014-10-14 LAB — TSH: TSH: 1.37 u[IU]/mL (ref 0.35–4.50)

## 2014-10-14 LAB — URINALYSIS
Bilirubin Urine: NEGATIVE
Hgb urine dipstick: NEGATIVE
Leukocytes, UA: NEGATIVE
Nitrite: NEGATIVE
PH: 5.5 (ref 5.0–8.0)
Specific Gravity, Urine: 1.02 (ref 1.000–1.030)
Urine Glucose: NEGATIVE
Urobilinogen, UA: 1 (ref 0.0–1.0)

## 2014-10-14 LAB — PSA: PSA: 0.38 ng/mL (ref 0.10–4.00)

## 2014-10-15 ENCOUNTER — Ambulatory Visit (INDEPENDENT_AMBULATORY_CARE_PROVIDER_SITE_OTHER): Payer: Medicare Other | Admitting: Internal Medicine

## 2014-10-15 ENCOUNTER — Encounter: Payer: Self-pay | Admitting: Internal Medicine

## 2014-10-15 VITALS — BP 90/54 | HR 57 | Wt 162.0 lb

## 2014-10-15 DIAGNOSIS — I2510 Atherosclerotic heart disease of native coronary artery without angina pectoris: Secondary | ICD-10-CM

## 2014-10-15 DIAGNOSIS — L723 Sebaceous cyst: Secondary | ICD-10-CM

## 2014-10-15 DIAGNOSIS — I739 Peripheral vascular disease, unspecified: Secondary | ICD-10-CM | POA: Diagnosis not present

## 2014-10-15 DIAGNOSIS — N189 Chronic kidney disease, unspecified: Secondary | ICD-10-CM

## 2014-10-15 DIAGNOSIS — N181 Chronic kidney disease, stage 1: Secondary | ICD-10-CM

## 2014-10-15 DIAGNOSIS — N182 Chronic kidney disease, stage 2 (mild): Secondary | ICD-10-CM

## 2014-10-15 DIAGNOSIS — N185 Chronic kidney disease, stage 5: Secondary | ICD-10-CM

## 2014-10-15 DIAGNOSIS — I1310 Hypertensive heart and chronic kidney disease without heart failure, with stage 1 through stage 4 chronic kidney disease, or unspecified chronic kidney disease: Secondary | ICD-10-CM | POA: Diagnosis not present

## 2014-10-15 DIAGNOSIS — N183 Chronic kidney disease, stage 3 (moderate): Secondary | ICD-10-CM

## 2014-10-15 DIAGNOSIS — N184 Chronic kidney disease, stage 4 (severe): Secondary | ICD-10-CM

## 2014-10-15 MED ORDER — ROSUVASTATIN CALCIUM 20 MG PO TABS: 20.0000 mg | ORAL_TABLET | Freq: Every day | ORAL | Status: DC

## 2014-10-15 NOTE — Assessment & Plan Note (Signed)
Crestor, Zetia, ASA, Plavix 

## 2014-10-15 NOTE — Assessment & Plan Note (Signed)
Painful - will remove

## 2014-10-15 NOTE — Progress Notes (Signed)
Subjective:  Patient ID: [name] [name], male    DOB: 05-26-1932  Age: 81 y.o. MRN: [id number]  CC: No chief complaint on file.   HPI [name] [name] presents for HTN, DM, CAD. C/o lump in L underarm  Outpatient Prescriptions Prior to Visit  Medication Sig Dispense Refill  . amLODipine (NORVASC) 5 MG tablet TAKE 1 TABLET DAILY 90 tablet 3  . aspirin 325 MG tablet Take 325 mg by mouth daily.     . Cholecalciferol (VITAMIN D3) 1000 UNITS CAPS Take 1 capsule by mouth daily.      . clopidogrel (PLAVIX) 75 MG tablet Take 1 tablet (75 mg total) by mouth daily. 90 tablet 3  . ezetimibe (ZETIA) 10 MG tablet Take 1 tablet (10 mg total) by mouth daily. 90 tablet 3  . isosorbide mononitrate (IMDUR) 60 MG 24 hr tablet Take 1 tablet (60 mg total) by mouth daily. 90 tablet 3  . losartan (COZAAR) 100 MG tablet Take 1 tablet (100 mg total) by mouth daily. 90 tablet 3  . metoprolol-hydrochlorothiazide (LOPRESSOR HCT) 100-25 MG per tablet TAKE 1 TABLET DAILY 90 tablet 3  . nitroGLYCERIN (NITROSTAT) 0.4 MG SL tablet Place 1 tablet (0.4 mg total) under the tongue every 5 (five) minutes as needed. For chest pain 20 tablet 3  . Polyethyl Glycol-Propyl Glycol (SYSTANE OP) Place 1 drop into both eyes as needed. For dry eyes    . prednisoLONE acetate (PRED FORTE) 1 % ophthalmic suspension     . PrednisoLONE Sodium Phosphate (PREDNISOL OP) Place 1 drop into the left eye as needed.     . rosuvastatin (CRESTOR) 40 MG tablet Take 0.5 tablets (20 mg total) by mouth daily. 90 tablet 3  . travoprost, benzalkonium, (TRAVATAN) 0.004 % ophthalmic solution Place 1 drop into both eyes at bedtime.      No facility-administered medications prior to visit.    ROS Review of Systems  Constitutional: Negative for fever, diaphoresis and appetite change.  HENT: Negative for rhinorrhea.   Respiratory: Negative for choking, chest tightness and wheezing.   Gastrointestinal: Negative for constipation.  Genitourinary: Negative for  urgency.  Musculoskeletal: Negative for neck pain.  Neurological: Negative for light-headedness.  Psychiatric/Behavioral: The patient is not nervous/anxious.     Objective:  BP 90/54 mmHg  Pulse 57  Wt 162 lb (73.483 kg)  SpO2 96%  BP Readings from Last 3 Encounters:  10/15/14 90/54  08/20/14 168/74  07/14/14 128/64    Wt Readings from Last 3 Encounters:  10/15/14 162 lb (73.483 kg)  07/14/14 160 lb (72.576 kg)  06/29/14 166 lb 3.2 oz (75.388 kg)    Physical Exam  Constitutional: He is oriented to person, place, and time. He appears well-developed. No distress.  NAD  HENT:  Mouth/Throat: Oropharynx is clear and moist.  Eyes: Conjunctivae are normal. Pupils are equal, round, and reactive to light.  Neck: Normal range of motion. No JVD present. No thyromegaly present.  Cardiovascular: Normal rate, regular rhythm, normal heart sounds and intact distal pulses.  Exam reveals no gallop and no friction rub.   No murmur heard. Pulmonary/Chest: Effort normal and breath sounds normal. No respiratory distress. He has no wheezes. He has no rales. He exhibits no tenderness.  Abdominal: Soft. Bowel sounds are normal. He exhibits no distension and no mass. There is no tenderness. There is no rebound and no guarding.  Musculoskeletal: Normal range of motion. He exhibits no edema or tenderness.  Lymphadenopathy:    He has  no cervical adenopathy.  Neurological: He is alert and oriented to person, place, and time. He has normal reflexes. No cranial nerve deficit. He exhibits normal muscle tone. He displays a negative Romberg sign. Coordination and gait normal.  Skin: Skin is warm and dry. No rash noted.  Psychiatric: He has a normal mood and affect. His behavior is normal. Judgment and thought content normal.  L axilla 22x20 mm sq mass 3 mm L shoulder mole  Lab Results  Component Value Date   WBC 6.2 10/14/2014   HGB 12.3* 10/14/2014   HCT 36.7* 10/14/2014   PLT 227.0 10/14/2014    GLUCOSE 100* 10/14/2014   CHOL 160 10/14/2014   TRIG 92.0 10/14/2014   HDL 52.70 10/14/2014   LDLDIRECT 136.2 09/28/2009   LDLCALC 89 10/14/2014   ALT 27 10/14/2014   AST 37 10/14/2014   NA 139 10/14/2014   K 3.8 10/14/2014   CL 106 10/14/2014   CREATININE 1.25 10/14/2014   BUN 22 10/14/2014   CO2 25 10/14/2014   TSH 1.37 10/14/2014   PSA 0.38 10/14/2014   INR 1.1 ratio* 10/06/2009   HGBA1C 5.6 12/09/2013    No results found.  Assessment & Plan:   There are no diagnoses linked to this encounter. I am having [name] maintain his aspirin, travoprost (benzalkonium), Vitamin D3, Polyethyl Glycol-Propyl Glycol (SYSTANE OP), PrednisoLONE Sodium Phosphate (PREDNISOL OP), prednisoLONE acetate, rosuvastatin, metoprolol-hydrochlorothiazide, amLODipine, nitroGLYCERIN, losartan, clopidogrel, isosorbide mononitrate, and ezetimibe.  No orders of the defined types were placed in this encounter.     Follow-up: No Follow-up on file.  Walker Kehr, MD

## 2014-10-15 NOTE — Assessment & Plan Note (Addendum)
ASA, Plavix Nl BP at home per pt Lopressor HCT, Losartan

## 2014-10-15 NOTE — Progress Notes (Signed)
Pre visit review using our clinic review tool, if applicable. No additional management support is needed unless otherwise documented below in the visit note. 

## 2014-11-19 ENCOUNTER — Ambulatory Visit (INDEPENDENT_AMBULATORY_CARE_PROVIDER_SITE_OTHER): Payer: Medicare Other | Admitting: Podiatry

## 2014-11-19 ENCOUNTER — Encounter: Payer: Self-pay | Admitting: Podiatry

## 2014-11-19 VITALS — BP 122/58 | HR 60

## 2014-11-19 DIAGNOSIS — B351 Tinea unguium: Secondary | ICD-10-CM | POA: Diagnosis not present

## 2014-11-19 DIAGNOSIS — M79606 Pain in leg, unspecified: Secondary | ICD-10-CM | POA: Diagnosis not present

## 2014-11-19 NOTE — Progress Notes (Signed)
Subjective:  81 y.o. year old male patient presents complaining of painful nails.   Objective:  Vascular: No edema or erythema noted. Pedal pulses are not palpable.  Dermatologic: Pedal pulses not palpable, bilateral.  Thick mycotic nails x 9 (2nd right missing).  Orthopedic: Hallux valgus with bunion on left.  Neurologic: All epicritic and tactile sensations grossly intact.   Assessment:  Mycotic nails x 9.  PVD.   Plan:  All nails debrided

## 2014-11-19 NOTE — Patient Instructions (Signed)
Seen for hypertrophic nails. All nails debrided. Return in 3 months or as needed.  

## 2015-01-17 ENCOUNTER — Ambulatory Visit (INDEPENDENT_AMBULATORY_CARE_PROVIDER_SITE_OTHER): Payer: Medicare Other | Admitting: Internal Medicine

## 2015-01-17 ENCOUNTER — Other Ambulatory Visit (INDEPENDENT_AMBULATORY_CARE_PROVIDER_SITE_OTHER): Payer: Medicare Other

## 2015-01-17 ENCOUNTER — Encounter: Payer: Self-pay | Admitting: Internal Medicine

## 2015-01-17 VITALS — BP 100/52 | HR 66 | Wt 162.0 lb

## 2015-01-17 DIAGNOSIS — E785 Hyperlipidemia, unspecified: Secondary | ICD-10-CM

## 2015-01-17 DIAGNOSIS — I2510 Atherosclerotic heart disease of native coronary artery without angina pectoris: Secondary | ICD-10-CM | POA: Diagnosis not present

## 2015-01-17 DIAGNOSIS — L723 Sebaceous cyst: Secondary | ICD-10-CM

## 2015-01-17 LAB — BASIC METABOLIC PANEL
BUN: 18 mg/dL (ref 6–23)
CHLORIDE: 106 meq/L (ref 96–112)
CO2: 24 mEq/L (ref 19–32)
CREATININE: 1.1 mg/dL (ref 0.40–1.50)
Calcium: 9.3 mg/dL (ref 8.4–10.5)
GFR: 82.81 mL/min (ref >60.00)
Glucose, Bld: 93 mg/dL (ref 70–99)
Potassium: 4 mEq/L (ref 3.5–5.1)
SODIUM: 139 meq/L (ref 135–145)

## 2015-01-17 NOTE — Assessment & Plan Note (Signed)
Crestor, Zetia, ASA, Plavix 

## 2015-01-17 NOTE — Progress Notes (Signed)
Pre visit review using our clinic review tool, if applicable. No additional management support is needed unless otherwise documented below in the visit note. 

## 2015-01-17 NOTE — Assessment & Plan Note (Signed)
L axilla seb cyst - self-drained/resolved

## 2015-01-17 NOTE — Progress Notes (Signed)
Subjective:  Patient ID: [name] [name], male    DOB: [birth year]  Age: 81 y.o. MRN: [id number]  CC: No chief complaint on file.   HPI [name] [name] presents for HTN, CAD, dyslipidemia f/up.  Outpatient Prescriptions Prior to Visit  Medication Sig Dispense Refill  . amLODipine (NORVASC) 5 MG tablet TAKE 1 TABLET DAILY 90 tablet 3  . aspirin 325 MG tablet Take 325 mg by mouth daily.     . Cholecalciferol (VITAMIN D3) 1000 UNITS CAPS Take 1 capsule by mouth daily.      . clopidogrel (PLAVIX) 75 MG tablet Take 1 tablet (75 mg total) by mouth daily. 90 tablet 3  . ezetimibe (ZETIA) 10 MG tablet Take 1 tablet (10 mg total) by mouth daily. 90 tablet 3  . isosorbide mononitrate (IMDUR) 60 MG 24 hr tablet Take 1 tablet (60 mg total) by mouth daily. 90 tablet 3  . losartan (COZAAR) 100 MG tablet Take 1 tablet (100 mg total) by mouth daily. 90 tablet 3  . metoprolol-hydrochlorothiazide (LOPRESSOR HCT) 100-25 MG per tablet TAKE 1 TABLET DAILY 90 tablet 3  . nitroGLYCERIN (NITROSTAT) 0.4 MG SL tablet Place 1 tablet (0.4 mg total) under the tongue every 5 (five) minutes as needed. For chest pain 20 tablet 3  . Polyethyl Glycol-Propyl Glycol (SYSTANE OP) Place 1 drop into both eyes as needed. For dry eyes    . prednisoLONE acetate (PRED FORTE) 1 % ophthalmic suspension     . PrednisoLONE Sodium Phosphate (PREDNISOL OP) Place 1 drop into the left eye as needed.     . rosuvastatin (CRESTOR) 20 MG tablet Take 1 tablet (20 mg total) by mouth daily. 90 tablet 3  . travoprost, benzalkonium, (TRAVATAN) 0.004 % ophthalmic solution Place 1 drop into both eyes at bedtime.      No facility-administered medications prior to visit.    ROS Review of Systems  Constitutional: Negative for appetite change, fatigue and unexpected weight change.  HENT: Negative for congestion, nosebleeds, sneezing, sore throat and trouble swallowing.   Eyes: Negative for itching and visual disturbance.  Respiratory: Negative for  cough.   Cardiovascular: Negative for chest pain, palpitations and leg swelling.  Gastrointestinal: Negative for nausea, diarrhea, blood in stool and abdominal distention.  Genitourinary: Negative for frequency and hematuria.  Musculoskeletal: Negative for back pain, joint swelling, gait problem and neck pain.  Skin: Negative for rash.  Neurological: Negative for dizziness, tremors, speech difficulty and weakness.  Psychiatric/Behavioral: Negative for sleep disturbance, dysphoric mood and agitation. The patient is not nervous/anxious.     Objective:  BP 100/52 mmHg  Pulse 66  Wt 162 lb (73.483 kg)  SpO2 97%  BP Readings from Last 3 Encounters:  01/17/15 100/52  11/19/14 122/58  10/15/14 90/54    Wt Readings from Last 3 Encounters:  01/17/15 162 lb (73.483 kg)  10/15/14 162 lb (73.483 kg)  07/14/14 160 lb (72.576 kg)    Physical Exam  Constitutional: He is oriented to person, place, and time. He appears well-developed. No distress.  NAD  HENT:  Mouth/Throat: Oropharynx is clear and moist.  Eyes: Conjunctivae are normal. Pupils are equal, round, and reactive to light.  Neck: Normal range of motion. No JVD present. No thyromegaly present.  Cardiovascular: Normal rate, regular rhythm, normal heart sounds and intact distal pulses.  Exam reveals no gallop and no friction rub.   No murmur heard. Pulmonary/Chest: Effort normal and breath sounds normal. No respiratory distress. He has no wheezes. He  has no rales. He exhibits no tenderness.  Abdominal: Soft. Bowel sounds are normal. He exhibits no distension and no mass. There is no tenderness. There is no rebound and no guarding.  Musculoskeletal: Normal range of motion. He exhibits no edema or tenderness.  Lymphadenopathy:    He has no cervical adenopathy.  Neurological: He is alert and oriented to person, place, and time. He has normal reflexes. No cranial nerve deficit. He exhibits normal muscle tone. He displays a negative  Romberg sign. Coordination abnormal. Gait normal.  Skin: Skin is warm and dry. No rash noted.  Psychiatric: He has a normal mood and affect. His behavior is normal. Judgment and thought content normal.  L axilla seb cyst - self-drained/resolved  Lab Results  Component Value Date   WBC 6.2 10/14/2014   HGB 12.3* 10/14/2014   HCT 36.7* 10/14/2014   PLT 227.0 10/14/2014   GLUCOSE 100* 10/14/2014   CHOL 160 10/14/2014   TRIG 92.0 10/14/2014   HDL 52.70 10/14/2014   LDLDIRECT 136.2 09/28/2009   LDLCALC 89 10/14/2014   ALT 27 10/14/2014   AST 37 10/14/2014   NA 139 10/14/2014   K 3.8 10/14/2014   CL 106 10/14/2014   CREATININE 1.25 10/14/2014   BUN 22 10/14/2014   CO2 25 10/14/2014   TSH 1.37 10/14/2014   PSA 0.38 10/14/2014   INR 1.1 ratio* 10/06/2009   HGBA1C 5.6 12/09/2013    No results found.  Assessment & Plan:   There are no diagnoses linked to this encounter. I am having [name] maintain his aspirin, travoprost (benzalkonium), Vitamin D3, Polyethyl Glycol-Propyl Glycol (SYSTANE OP), PrednisoLONE Sodium Phosphate (PREDNISOL OP), prednisoLONE acetate, metoprolol-hydrochlorothiazide, amLODipine, nitroGLYCERIN, losartan, clopidogrel, isosorbide mononitrate, ezetimibe, and rosuvastatin.  No orders of the defined types were placed in this encounter.     Follow-up: No Follow-up on file.  Walker Kehr, MD

## 2015-01-27 ENCOUNTER — Other Ambulatory Visit: Payer: Self-pay | Admitting: *Deleted

## 2015-01-27 MED ORDER — LOSARTAN POTASSIUM 100 MG PO TABS: 100.0000 mg | ORAL_TABLET | Freq: Every day | ORAL | Status: DC

## 2015-01-27 MED ORDER — CLOPIDOGREL BISULFATE 75 MG PO TABS
75.0000 mg | ORAL_TABLET | Freq: Every day | ORAL | Status: DC
Start: 2015-01-27 — End: 2016-01-11

## 2015-02-18 ENCOUNTER — Ambulatory Visit: Payer: Medicare Other | Admitting: Podiatry

## 2015-03-14 ENCOUNTER — Telehealth: Payer: Self-pay | Admitting: Internal Medicine

## 2015-03-14 NOTE — Telephone Encounter (Signed)
Pt called stated he need referral from Dr. Camila Li to go to Dr. Marcene Corning on [facility] [place]. Pt has an appt on 03/18/15, please get this done before his appt, pt has medicare and mail handler. Please call pt to let him know.

## 2015-03-17 NOTE — Telephone Encounter (Signed)
Patient's insurance does not require a referral. Spoke w/Dr. Gertie Exon office to verify. Lm on patient's vm.

## 2015-03-18 DIAGNOSIS — H401132 Primary open-angle glaucoma, bilateral, moderate stage: Secondary | ICD-10-CM | POA: Diagnosis not present

## 2015-03-18 DIAGNOSIS — H16223 Keratoconjunctivitis sicca, not specified as Sjogren's, bilateral: Secondary | ICD-10-CM | POA: Diagnosis not present

## 2015-03-18 DIAGNOSIS — H25811 Combined forms of age-related cataract, right eye: Secondary | ICD-10-CM | POA: Diagnosis not present

## 2015-04-20 ENCOUNTER — Encounter: Payer: Self-pay | Admitting: Podiatry

## 2015-04-20 ENCOUNTER — Ambulatory Visit (INDEPENDENT_AMBULATORY_CARE_PROVIDER_SITE_OTHER): Payer: Medicare Other | Admitting: Podiatry

## 2015-04-20 VITALS — BP 104/47 | HR 58

## 2015-04-20 DIAGNOSIS — B351 Tinea unguium: Secondary | ICD-10-CM | POA: Diagnosis not present

## 2015-04-20 DIAGNOSIS — M79606 Pain in leg, unspecified: Secondary | ICD-10-CM | POA: Diagnosis not present

## 2015-04-20 NOTE — Patient Instructions (Signed)
Seen for hypertrophic nails. All nails debrided. Return in 3 months or as needed.  

## 2015-04-20 NOTE — Progress Notes (Signed)
Subjective:  82 y.o. year old male patient presents complaining of painful nails and requesting them trimmed.  Objective:  Vascular: No edema or erythema noted. Pedal pulses are not palpable.  Dermatologic: Pedal pulses not palpable, bilateral.  Thick mycotic nails x 9 (2nd right missing).  Orthopedic: Hallux valgus with bunion on left.  Neurologic: All epicritic and tactile sensations grossly intact.   Assessment:  Mycotic nails x 9.  PVD.   Plan:  All nails debrided

## 2015-05-20 ENCOUNTER — Encounter: Payer: Self-pay | Admitting: Internal Medicine

## 2015-05-20 ENCOUNTER — Ambulatory Visit (INDEPENDENT_AMBULATORY_CARE_PROVIDER_SITE_OTHER): Payer: Medicare Other | Admitting: Internal Medicine

## 2015-05-20 ENCOUNTER — Other Ambulatory Visit (INDEPENDENT_AMBULATORY_CARE_PROVIDER_SITE_OTHER): Payer: Medicare Other

## 2015-05-20 VITALS — BP 108/52 | HR 67 | Wt 164.0 lb

## 2015-05-20 DIAGNOSIS — H6590 Unspecified nonsuppurative otitis media, unspecified ear: Secondary | ICD-10-CM | POA: Insufficient documentation

## 2015-05-20 DIAGNOSIS — I2510 Atherosclerotic heart disease of native coronary artery without angina pectoris: Secondary | ICD-10-CM

## 2015-05-20 DIAGNOSIS — I739 Peripheral vascular disease, unspecified: Secondary | ICD-10-CM

## 2015-05-20 DIAGNOSIS — G5601 Carpal tunnel syndrome, right upper limb: Secondary | ICD-10-CM | POA: Diagnosis not present

## 2015-05-20 DIAGNOSIS — H6521 Chronic serous otitis media, right ear: Secondary | ICD-10-CM

## 2015-05-20 LAB — BASIC METABOLIC PANEL
BUN: 29 mg/dL — AB (ref 6–23)
CALCIUM: 9.6 mg/dL (ref 8.4–10.5)
CO2: 27 meq/L (ref 19–32)
Chloride: 109 mEq/L (ref 96–112)
Creatinine, Ser: 1.29 mg/dL (ref 0.40–1.50)
GFR: 68.84 mL/min (ref >60.00)
GLUCOSE: 97 mg/dL (ref 70–99)
Potassium: 3.8 mEq/L (ref 3.5–5.1)
SODIUM: 143 meq/L (ref 135–145)

## 2015-05-20 MED ORDER — MELOXICAM 7.5 MG PO TABS: 7.5000 mg | ORAL_TABLET | Freq: Every day | ORAL | Status: DC

## 2015-05-20 NOTE — Progress Notes (Signed)
Subjective:  Patient ID: [name] [name], male    DOB: April 15, 1933  Age: 82 y.o. MRN: [id number]  CC: No chief complaint on file.   HPI ZHEN AUGUSTA presents for HTN, dyslipidemia, CAD f/u. C/o periodic numbness in fingers # 1-2-3 on the R. He is r handed. C/o R ear crackling  Outpatient Prescriptions Prior to Visit  Medication Sig Dispense Refill  . amLODipine (NORVASC) 5 MG tablet TAKE 1 TABLET DAILY 90 tablet 3  . aspirin 325 MG tablet Take 325 mg by mouth daily.     . Cholecalciferol (VITAMIN D3) 1000 UNITS CAPS Take 1 capsule by mouth daily.      . clopidogrel (PLAVIX) 75 MG tablet Take 1 tablet (75 mg total) by mouth daily. 90 tablet 3  . ezetimibe (ZETIA) 10 MG tablet Take 1 tablet (10 mg total) by mouth daily. 90 tablet 3  . isosorbide mononitrate (IMDUR) 60 MG 24 hr tablet Take 1 tablet (60 mg total) by mouth daily. 90 tablet 3  . losartan (COZAAR) 100 MG tablet Take 1 tablet (100 mg total) by mouth daily. 90 tablet 3  . metoprolol-hydrochlorothiazide (LOPRESSOR HCT) 100-25 MG per tablet TAKE 1 TABLET DAILY 90 tablet 3  . nitroGLYCERIN (NITROSTAT) 0.4 MG SL tablet Place 1 tablet (0.4 mg total) under the tongue every 5 (five) minutes as needed. For chest pain 20 tablet 3  . Polyethyl Glycol-Propyl Glycol (SYSTANE OP) Place 1 drop into both eyes as needed. For dry eyes    . prednisoLONE acetate (PRED FORTE) 1 % ophthalmic suspension     . PrednisoLONE Sodium Phosphate (PREDNISOL OP) Place 1 drop into the left eye as needed.     . rosuvastatin (CRESTOR) 20 MG tablet Take 1 tablet (20 mg total) by mouth daily. 90 tablet 3  . travoprost, benzalkonium, (TRAVATAN) 0.004 % ophthalmic solution Place 1 drop into both eyes at bedtime.      No facility-administered medications prior to visit.    ROS Review of Systems  Objective:  BP 108/52 mmHg  Pulse 67  Wt 164 lb (74.39 kg)  SpO2 99%  BP Readings from Last 3 Encounters:  05/20/15 108/52  04/20/15 104/47  01/17/15 100/52     Wt Readings from Last 3 Encounters:  05/20/15 164 lb (74.39 kg)  01/17/15 162 lb (73.483 kg)  10/15/14 162 lb (73.483 kg)    Physical Exam  Lab Results  Component Value Date   WBC 6.2 10/14/2014   HGB 12.3* 10/14/2014   HCT 36.7* 10/14/2014   PLT 227.0 10/14/2014   GLUCOSE 93 01/17/2015   CHOL 160 10/14/2014   TRIG 92.0 10/14/2014   HDL 52.70 10/14/2014   LDLDIRECT 136.2 09/28/2009   LDLCALC 89 10/14/2014   ALT 27 10/14/2014   AST 37 10/14/2014   NA 139 01/17/2015   K 4.0 01/17/2015   CL 106 01/17/2015   CREATININE 1.10 01/17/2015   BUN 18 01/17/2015   CO2 24 01/17/2015   TSH 1.37 10/14/2014   PSA 0.38 10/14/2014   INR 1.1 ratio* 10/06/2009   HGBA1C 5.6 12/09/2013    No results found.  Assessment & Plan:   There are no diagnoses linked to this encounter. I am having [name] maintain his aspirin, travoprost (benzalkonium), Vitamin D3, Polyethyl Glycol-Propyl Glycol (SYSTANE OP), PrednisoLONE Sodium Phosphate (PREDNISOL OP), prednisoLONE acetate, metoprolol-hydrochlorothiazide, amLODipine, nitroGLYCERIN, isosorbide mononitrate, ezetimibe, rosuvastatin, clopidogrel, and losartan.  No orders of the defined types were placed in this encounter.  Follow-up: No Follow-up on file.  Walker Kehr, MD

## 2015-05-20 NOTE — Assessment & Plan Note (Signed)
2/17 R ear Use Afrin prn

## 2015-05-20 NOTE — Assessment & Plan Note (Addendum)
Use a brace Meloxicam x 1-2 weeks

## 2015-05-20 NOTE — Assessment & Plan Note (Signed)
Crestor, Zetia, ASA, Plavix 

## 2015-05-20 NOTE — Patient Instructions (Signed)
Use Afrin nasal spray for a few days 

## 2015-05-20 NOTE — Progress Notes (Signed)
Pre visit review using our clinic review tool, if applicable. No additional management support is needed unless otherwise documented below in the visit note. 

## 2015-06-20 ENCOUNTER — Other Ambulatory Visit: Payer: Self-pay | Admitting: Internal Medicine

## 2015-07-17 ENCOUNTER — Other Ambulatory Visit: Payer: Self-pay | Admitting: Internal Medicine

## 2015-07-19 ENCOUNTER — Encounter: Payer: Self-pay | Admitting: Podiatry

## 2015-07-19 ENCOUNTER — Ambulatory Visit (INDEPENDENT_AMBULATORY_CARE_PROVIDER_SITE_OTHER): Payer: Medicare Other | Admitting: Podiatry

## 2015-07-19 VITALS — BP 108/48 | HR 60

## 2015-07-19 DIAGNOSIS — M79606 Pain in leg, unspecified: Secondary | ICD-10-CM

## 2015-07-19 DIAGNOSIS — L03032 Cellulitis of left toe: Secondary | ICD-10-CM

## 2015-07-19 DIAGNOSIS — B351 Tinea unguium: Secondary | ICD-10-CM | POA: Diagnosis not present

## 2015-07-19 DIAGNOSIS — L600 Ingrowing nail: Secondary | ICD-10-CM

## 2015-07-19 DIAGNOSIS — M79673 Pain in unspecified foot: Secondary | ICD-10-CM | POA: Diagnosis not present

## 2015-07-19 NOTE — Patient Instructions (Signed)
Ingrown nail surgery was done on left great toe medial border.. Follow soaking instruction.  Some redness and drainage is expected. Call the office if the area gets feverish with increased redness and drainage.  

## 2015-07-19 NOTE — Progress Notes (Signed)
Subjective:  82 y.o. year old male patient presents complaining of painful draining nail on left great toe and thick nails on other digits. Been having ingrown nail problem for over 4 years on left great toe.   Objective:  Vascular: Pedal pulses are not palpable bilateral. Mild forefoot edema bilateral. Dermatologic:  Thick mycotic nails x 9 (2nd right missing).  Draining ingrown nail left great toe with proud flesh. Orthopedic: Hallux valgus with bunion on left.  Neurologic: All epicritic and tactile sensations grossly intact.   Assessment:  Infected ingrown nail left great toe medial border. Mycotic nails x 9.  PVD.   Plan:  All nails debride.  Phenol and Alcohol matrixectomy done on left great toe medial border. Procedure one as follow.  Affected left great toe was anesthetized with total 33ml mixture of 50/50 0.5% Marcaine plain and 1% Xylocaine plain. Affected medial nail border was reflected with a nail elevator and excised with nail nipper. Proximal nail matrix tissue was cauterized with Phenol soaked cotton applicator x 4 and neutralized with Alcohol soaked cotton applicator. The wound was dressed with Amerigel ointment dressing. Home care instructions and supply dispensed.  Return in 1 week for follow up.

## 2015-07-26 ENCOUNTER — Encounter: Payer: Self-pay | Admitting: Podiatry

## 2015-07-26 ENCOUNTER — Ambulatory Visit (INDEPENDENT_AMBULATORY_CARE_PROVIDER_SITE_OTHER): Payer: Medicare Other | Admitting: Podiatry

## 2015-07-26 DIAGNOSIS — L600 Ingrowing nail: Secondary | ICD-10-CM

## 2015-07-26 NOTE — Progress Notes (Signed)
Post op wound left great toe medial border clean and dry. Having minor discomfort if hit against something. Continue to soak till no more tenderness. Return in 3 month for routine foot care.

## 2015-07-26 NOTE — Patient Instructions (Signed)
Post op wound healing well on left great toe. Return in 3 month for routine foot care.

## 2015-08-17 ENCOUNTER — Other Ambulatory Visit (INDEPENDENT_AMBULATORY_CARE_PROVIDER_SITE_OTHER): Payer: Medicare Other

## 2015-08-17 ENCOUNTER — Ambulatory Visit (INDEPENDENT_AMBULATORY_CARE_PROVIDER_SITE_OTHER)
Admission: RE | Admit: 2015-08-17 | Discharge: 2015-08-17 | Disposition: A | Discharge status: Home / Self Care | Payer: Medicare Other | Source: Ambulatory Visit | Attending: Internal Medicine | Admitting: Internal Medicine

## 2015-08-17 ENCOUNTER — Ambulatory Visit (INDEPENDENT_AMBULATORY_CARE_PROVIDER_SITE_OTHER): Payer: Medicare Other | Admitting: Internal Medicine

## 2015-08-17 ENCOUNTER — Encounter: Payer: Self-pay | Admitting: Internal Medicine

## 2015-08-17 VITALS — BP 100/54 | HR 63 | Wt 156.0 lb

## 2015-08-17 DIAGNOSIS — I130 Hypertensive heart and chronic kidney disease with heart failure and stage 1 through stage 4 chronic kidney disease, or unspecified chronic kidney disease: Secondary | ICD-10-CM

## 2015-08-17 DIAGNOSIS — R7309 Other abnormal glucose: Secondary | ICD-10-CM

## 2015-08-17 DIAGNOSIS — Z23 Encounter for immunization: Secondary | ICD-10-CM | POA: Diagnosis not present

## 2015-08-17 DIAGNOSIS — I739 Peripheral vascular disease, unspecified: Secondary | ICD-10-CM

## 2015-08-17 DIAGNOSIS — R634 Abnormal weight loss: Secondary | ICD-10-CM

## 2015-08-17 DIAGNOSIS — R109 Unspecified abdominal pain: Secondary | ICD-10-CM

## 2015-08-17 DIAGNOSIS — R600 Localized edema: Secondary | ICD-10-CM

## 2015-08-17 DIAGNOSIS — R609 Edema, unspecified: Secondary | ICD-10-CM | POA: Insufficient documentation

## 2015-08-17 DIAGNOSIS — I2510 Atherosclerotic heart disease of native coronary artery without angina pectoris: Secondary | ICD-10-CM

## 2015-08-17 DIAGNOSIS — M5440 Lumbago with sciatica, unspecified side: Secondary | ICD-10-CM

## 2015-08-17 DIAGNOSIS — R5383 Other fatigue: Secondary | ICD-10-CM | POA: Diagnosis not present

## 2015-08-17 LAB — BASIC METABOLIC PANEL
BUN: 27 mg/dL — ABNORMAL HIGH (ref 6–23)
CALCIUM: 9.1 mg/dL (ref 8.4–10.5)
CHLORIDE: 107 meq/L (ref 96–112)
CO2: 26 meq/L (ref 19–32)
CREATININE: 1.25 mg/dL (ref 0.40–1.50)
GFR: 71.34 mL/min (ref >60.00)
GLUCOSE: 95 mg/dL (ref 70–99)
Potassium: 4 mEq/L (ref 3.5–5.1)
Sodium: 140 mEq/L (ref 135–145)

## 2015-08-17 LAB — URINALYSIS
Bilirubin Urine: NEGATIVE
Hgb urine dipstick: NEGATIVE
Ketones, ur: NEGATIVE
Leukocytes, UA: NEGATIVE
Nitrite: NEGATIVE
SPECIFIC GRAVITY, URINE: 1.015 (ref 1.000–1.030)
Total Protein, Urine: NEGATIVE
URINE GLUCOSE: NEGATIVE
Urobilinogen, UA: 0.2 (ref 0.0–1.0)
pH: 6.5 (ref 5.0–8.0)

## 2015-08-17 LAB — CBC WITH DIFFERENTIAL/PLATELET
BASOS ABS: 0 10*3/uL (ref 0.0–0.1)
Basophils Relative: 0.3 % (ref 0.0–3.0)
EOS ABS: 0.3 10*3/uL (ref 0.0–0.7)
Eosinophils Relative: 5.7 % — ABNORMAL HIGH (ref 0.0–5.0)
HCT: 29.6 % — ABNORMAL LOW (ref 39.0–52.0)
Hemoglobin: 9.8 g/dL — ABNORMAL LOW (ref 13.0–17.0)
LYMPHS ABS: 1.2 10*3/uL (ref 0.7–4.0)
Lymphocytes Relative: 25.7 % (ref 12.0–46.0)
MCHC: 33 g/dL (ref 30.0–36.0)
MCV: 81.2 fl (ref 78.0–100.0)
MONO ABS: 0.5 10*3/uL (ref 0.1–1.0)
MONOS PCT: 10.1 % (ref 3.0–12.0)
NEUTROS ABS: 2.8 10*3/uL (ref 1.4–7.7)
NEUTROS PCT: 58.2 % (ref 43.0–77.0)
PLATELETS: 221 10*3/uL (ref 150.0–400.0)
RBC: 3.65 Mil/uL — AB (ref 4.22–5.81)
RDW: 14.9 % (ref 11.5–15.5)
WBC: 4.8 10*3/uL (ref 4.0–10.5)

## 2015-08-17 LAB — SEDIMENTATION RATE: SED RATE: 26 mm/h — AB (ref 0–22)

## 2015-08-17 LAB — HEPATIC FUNCTION PANEL
ALK PHOS: 55 U/L (ref 39–117)
ALT: 17 U/L (ref 0–53)
AST: 28 U/L (ref 0–37)
Albumin: 3.9 g/dL (ref 3.5–5.2)
BILIRUBIN DIRECT: 0.2 mg/dL (ref 0.0–0.3)
BILIRUBIN TOTAL: 0.9 mg/dL (ref 0.2–1.2)
Total Protein: 6.3 g/dL (ref 6.0–8.3)

## 2015-08-17 LAB — TSH: TSH: 1.1 u[IU]/mL (ref 0.35–4.50)

## 2015-08-17 NOTE — Progress Notes (Signed)
Pre visit review using our clinic review tool, if applicable. No additional management support is needed unless otherwise documented below in the visit note. 

## 2015-08-17 NOTE — Assessment & Plan Note (Signed)
5/17 ?etiology Labs CXR

## 2015-08-17 NOTE — Progress Notes (Signed)
Subjective:  Patient ID: [name] [name], male    DOB: 1933/02/22  Age: 82 y.o. MRN: [id number]  CC: No chief complaint on file.   HPI [name] [name] presents for CAD, HTN, CRI f/u. C/o LBP - L side  Outpatient Prescriptions Prior to Visit  Medication Sig Dispense Refill  . amLODipine (NORVASC) 5 MG tablet TAKE 1 TABLET DAILY 90 tablet 3  . aspirin 325 MG tablet Take 325 mg by mouth daily.     . Cholecalciferol (VITAMIN D3) 1000 UNITS CAPS Take 1 capsule by mouth daily.      . clopidogrel (PLAVIX) 75 MG tablet Take 1 tablet (75 mg total) by mouth daily. 90 tablet 3  . ezetimibe (ZETIA) 10 MG tablet Take 1 tablet (10 mg total) by mouth daily. 90 tablet 3  . isosorbide mononitrate (IMDUR) 60 MG 24 hr tablet TAKE 1 TABLET DAILY 90 tablet 3  . losartan (COZAAR) 100 MG tablet Take 1 tablet (100 mg total) by mouth daily. 90 tablet 3  . meloxicam (MOBIC) 7.5 MG tablet Take 1 tablet (7.5 mg total) by mouth daily. 30 tablet 0  . metoprolol-hydrochlorothiazide (LOPRESSOR HCT) 100-25 MG tablet TAKE 1 TABLET DAILY 90 tablet 3  . nitroGLYCERIN (NITROSTAT) 0.4 MG SL tablet Place 1 tablet (0.4 mg total) under the tongue every 5 (five) minutes as needed. For chest pain 20 tablet 3  . Polyethyl Glycol-Propyl Glycol (SYSTANE OP) Place 1 drop into both eyes as needed. For dry eyes    . prednisoLONE acetate (PRED FORTE) 1 % ophthalmic suspension     . PrednisoLONE Sodium Phosphate (PREDNISOL OP) Place 1 drop into the left eye as needed.     . rosuvastatin (CRESTOR) 20 MG tablet Take 1 tablet (20 mg total) by mouth daily. 90 tablet 3  . travoprost, benzalkonium, (TRAVATAN) 0.004 % ophthalmic solution Place 1 drop into both eyes at bedtime.      No facility-administered medications prior to visit.    ROS Review of Systems  Constitutional: Positive for fatigue and unexpected weight change. Negative for appetite change.  HENT: Negative for congestion, nosebleeds, sneezing, sore throat and trouble swallowing.    Eyes: Negative for itching and visual disturbance.  Respiratory: Negative for cough.   Cardiovascular: Negative for chest pain, palpitations and leg swelling.  Gastrointestinal: Negative for nausea, diarrhea, blood in stool and abdominal distention.  Genitourinary: Negative for frequency and hematuria.  Musculoskeletal: Negative for back pain, joint swelling, gait problem and neck pain.  Skin: Negative for rash.  Neurological: Negative for dizziness, tremors, speech difficulty and weakness.  Psychiatric/Behavioral: Negative for suicidal ideas, sleep disturbance, dysphoric mood and agitation. The patient is not nervous/anxious.     Objective:  BP 100/54 mmHg  Pulse 63  Wt 156 lb (70.761 kg)  SpO2 97%  BP Readings from Last 3 Encounters:  08/17/15 100/54  07/19/15 108/48  05/20/15 108/52    Wt Readings from Last 3 Encounters:  08/17/15 156 lb (70.761 kg)  05/20/15 164 lb (74.39 kg)  01/17/15 162 lb (73.483 kg)    Physical Exam  Constitutional: He is oriented to person, place, and time. He appears well-developed. No distress.  NAD  HENT:  Mouth/Throat: Oropharynx is clear and moist.  Eyes: Conjunctivae are normal. Pupils are equal, round, and reactive to light.  Neck: Normal range of motion. No JVD present. No thyromegaly present.  Cardiovascular: Normal rate, regular rhythm, normal heart sounds and intact distal pulses.  Exam reveals no gallop and no  friction rub.   No murmur heard. Pulmonary/Chest: Effort normal and breath sounds normal. No respiratory distress. He has no wheezes. He has no rales. He exhibits no tenderness.  Abdominal: Soft. Bowel sounds are normal. He exhibits no distension and no mass. There is no tenderness. There is no rebound and no guarding.  Musculoskeletal: Normal range of motion. He exhibits no edema or tenderness.  Lymphadenopathy:    He has no cervical adenopathy.  Neurological: He is alert and oriented to person, place, and time. He has normal  reflexes. No cranial nerve deficit. He exhibits normal muscle tone. He displays a negative Romberg sign. Coordination and gait normal.  Skin: Skin is warm and dry. No rash noted.  Psychiatric: He has a normal mood and affect. His behavior is normal. Judgment and thought content normal.  LS tender R shin 1+, L shin trace swelling  Lab Results  Component Value Date   WBC 6.2 10/14/2014   HGB 12.3* 10/14/2014   HCT 36.7* 10/14/2014   PLT 227.0 10/14/2014   GLUCOSE 97 05/20/2015   CHOL 160 10/14/2014   TRIG 92.0 10/14/2014   HDL 52.70 10/14/2014   LDLDIRECT 136.2 09/28/2009   LDLCALC 89 10/14/2014   ALT 27 10/14/2014   AST 37 10/14/2014   NA 143 05/20/2015   K 3.8 05/20/2015   CL 109 05/20/2015   CREATININE 1.29 05/20/2015   BUN 29* 05/20/2015   CO2 27 05/20/2015   TSH 1.37 10/14/2014   PSA 0.38 10/14/2014   INR 1.1 ratio* 10/06/2009   HGBA1C 5.6 12/09/2013    No results found.  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 maintain his aspirin, travoprost (benzalkonium), Vitamin D3, Polyethyl Glycol-Propyl Glycol (SYSTANE OP), PrednisoLONE Sodium Phosphate (PREDNISOL OP), prednisoLONE acetate, nitroGLYCERIN, ezetimibe, rosuvastatin, clopidogrel, losartan, meloxicam, amLODipine, metoprolol-hydrochlorothiazide, isosorbide mononitrate, and TRAVATAN Z.  Meds ordered this encounter  Medications  . TRAVATAN Z 0.004 % SOLN ophthalmic solution    Sig: Place 1 drop into both eyes daily.     Follow-up: No Follow-up on file.  Walker Kehr, MD

## 2015-08-17 NOTE — Assessment & Plan Note (Signed)
Nl BP at home per pt Lopressor HCT, Losartan

## 2015-08-17 NOTE — Assessment & Plan Note (Signed)
Crestor, Zetia, ASA, Plavix 

## 2015-08-17 NOTE — Assessment & Plan Note (Signed)
B LEs R>>L x years (post cardiac surgery)

## 2015-08-17 NOTE — Assessment & Plan Note (Signed)
Monitoring labs 

## 2015-08-17 NOTE — Assessment & Plan Note (Signed)
L LBP [place] abd UA/labs

## 2015-08-18 ENCOUNTER — Other Ambulatory Visit: Payer: Self-pay | Admitting: Internal Medicine

## 2015-08-18 DIAGNOSIS — R634 Abnormal weight loss: Secondary | ICD-10-CM

## 2015-08-18 DIAGNOSIS — D509 Iron deficiency anemia, unspecified: Secondary | ICD-10-CM

## 2015-08-18 NOTE — Addendum Note (Signed)
Addended by: Cresenciano Lick on: 08/18/2015 08:25 AM   Modules accepted: Orders

## 2015-08-26 ENCOUNTER — Other Ambulatory Visit: Payer: Medicare Other

## 2015-08-30 ENCOUNTER — Telehealth: Payer: Self-pay | Admitting: Internal Medicine

## 2015-08-30 ENCOUNTER — Encounter: Payer: Self-pay | Admitting: Internal Medicine

## 2015-08-30 NOTE — Telephone Encounter (Signed)
error 

## 2015-08-31 ENCOUNTER — Ambulatory Visit
Admission: RE | Admit: 2015-08-31 | Discharge: 2015-08-31 | Disposition: A | Discharge status: Home / Self Care | Payer: Medicare Other | Source: Ambulatory Visit | Attending: Internal Medicine | Admitting: Internal Medicine

## 2015-08-31 ENCOUNTER — Encounter: Payer: Self-pay | Admitting: Internal Medicine

## 2015-08-31 DIAGNOSIS — K8020 Calculus of gallbladder without cholecystitis without obstruction: Secondary | ICD-10-CM | POA: Diagnosis not present

## 2015-08-31 DIAGNOSIS — R10A2 Flank pain, left side: Secondary | ICD-10-CM

## 2015-08-31 DIAGNOSIS — I130 Hypertensive heart and chronic kidney disease with heart failure and stage 1 through stage 4 chronic kidney disease, or unspecified chronic kidney disease: Secondary | ICD-10-CM

## 2015-08-31 DIAGNOSIS — R109 Unspecified abdominal pain: Secondary | ICD-10-CM

## 2015-08-31 DIAGNOSIS — R634 Abnormal weight loss: Secondary | ICD-10-CM

## 2015-09-05 ENCOUNTER — Telehealth: Payer: Self-pay | Admitting: Internal Medicine

## 2015-09-05 NOTE — Telephone Encounter (Signed)
Patient is returning your call. He states that he will be available for the remainder of the day for call back

## 2015-09-05 NOTE — Telephone Encounter (Signed)
Pt informed   Notes Recorded by Cresenciano Lick, CMA on 09/05/2015 at 10:52 AM Left mess for patient to call back. Notes Recorded by Cassandria Anger, MD on 08/31/2015 at 10:08 PM Erline Levine, please, inform patient that his abd [place] is normal except for gallstones - they do not seem to be causing problems Thx

## 2015-10-24 ENCOUNTER — Telehealth: Payer: Self-pay | Admitting: Internal Medicine

## 2015-10-24 NOTE — Telephone Encounter (Signed)
Pt's daughter called hopping to speak to the assistant concern about [name] [name] meds. [place] and he forgot some of his meds ( he does not know which med he need). Please call him back ASAP, he is out of one of his med.

## 2015-10-25 ENCOUNTER — Ambulatory Visit: Payer: Medicare Other | Admitting: Podiatry

## 2015-10-25 ENCOUNTER — Telehealth: Payer: Self-pay

## 2015-10-25 NOTE — Telephone Encounter (Signed)
i am calling in 3 pills of imdur to patient's daughter's pharm----she lives 3 hours from patient and patient came to visit her for 3 days but forgot to bring this med with him---patient's daughter, Mateo Flow is ok with paying out of pocket for three day supply should insurance refuse to cover---[phone] [facility] patient request----patient's daughter, Mateo Flow is not on patient's DPR, but i did speak with patient on phone to verify his name and date of birth and obtain his consent to talk with patient's daughter over the phone about his medications---daughter/valerie will be coming with father at his next appt to take care of signing appropriate DPR papers

## 2015-10-25 NOTE — Telephone Encounter (Signed)
Called and left message for valerie to call tamara back----i can't find valerie on patient's DPR, i will need to get permission from patient to discuss med list with daughter, valerie----valerie needs to talk with tamara

## 2015-10-27 NOTE — Telephone Encounter (Signed)
[name] [name] called back, we went thru med list and i have sent in rx for 3 day supply of med that patient forgot to bring---can see other note generated

## 2015-11-01 ENCOUNTER — Ambulatory Visit: Payer: Medicare Other | Admitting: Internal Medicine

## 2015-11-08 ENCOUNTER — Other Ambulatory Visit: Payer: Self-pay | Admitting: Internal Medicine

## 2015-11-16 ENCOUNTER — Ambulatory Visit: Payer: Medicare Other | Admitting: Internal Medicine

## 2015-12-09 DIAGNOSIS — H401132 Primary open-angle glaucoma, bilateral, moderate stage: Secondary | ICD-10-CM | POA: Diagnosis not present

## 2015-12-09 DIAGNOSIS — H16223 Keratoconjunctivitis sicca, not specified as Sjogren's, bilateral: Secondary | ICD-10-CM | POA: Diagnosis not present

## 2015-12-09 DIAGNOSIS — H25811 Combined forms of age-related cataract, right eye: Secondary | ICD-10-CM | POA: Diagnosis not present

## 2015-12-21 ENCOUNTER — Encounter: Payer: Self-pay | Admitting: Internal Medicine

## 2015-12-21 ENCOUNTER — Ambulatory Visit (INDEPENDENT_AMBULATORY_CARE_PROVIDER_SITE_OTHER): Payer: Medicare Other | Admitting: Internal Medicine

## 2015-12-21 ENCOUNTER — Telehealth: Payer: Self-pay | Admitting: Internal Medicine

## 2015-12-21 VITALS — BP 118/68 | HR 52 | Temp 98.2°F | Resp 20 | Wt 145.0 lb

## 2015-12-21 DIAGNOSIS — I2510 Atherosclerotic heart disease of native coronary artery without angina pectoris: Secondary | ICD-10-CM | POA: Diagnosis not present

## 2015-12-21 DIAGNOSIS — R197 Diarrhea, unspecified: Secondary | ICD-10-CM | POA: Diagnosis not present

## 2015-12-21 DIAGNOSIS — R634 Abnormal weight loss: Secondary | ICD-10-CM | POA: Diagnosis not present

## 2015-12-21 DIAGNOSIS — J309 Allergic rhinitis, unspecified: Secondary | ICD-10-CM

## 2015-12-21 NOTE — Progress Notes (Signed)
Pre visit review using our clinic review tool, if applicable. No additional management support is needed unless otherwise documented below in the visit note. 

## 2015-12-21 NOTE — Assessment & Plan Note (Signed)
Mild to mod, for depomedrol IM x 1,  to f/u any worsening symptoms or concerns

## 2015-12-21 NOTE — Assessment & Plan Note (Signed)
Etiology unclear, afeb and non bloody, for GI pathogen panel, stool cx, celiac panel, f/u GI in am; hold any antibx, doubt c diff based on exam

## 2015-12-21 NOTE — Progress Notes (Signed)
Subjective:    Patient ID: [name] [name], male    DOB: Feb 04, 1933, 82 y.o.   MRN: [id number]  HPI  Here with c/o acute onset diarrhea x 4 days, watery and loose without blood, and no n/v, abd pain, fever, or hx of recent antibx or c diff.  No prior recent hx or sick contacts.  No laxative use. Has some gurgling to left abd and knows now to head to [place] to avoid incontinence.  Did take immodium this am per family with good results to get here.  Does have also early satiety and weight loss for unclear reason, appetite has been less.  No recent hx of worsening rheum or known malignant dz.   Incidentally has appt tomorrow with GI  No recent sick contacts Wt Readings from Last 3 Encounters:  12/21/15 145 lb (65.8 kg)  08/17/15 156 lb (70.8 kg)  05/20/15 164 lb (74.4 kg)  Does also have several wks ongoing nasal allergy symptoms with clearish congestion, itch and sneezing, without fever, pain, ST, cough, swelling or wheezing. Past Medical History:  Diagnosis Date  . Carotid artery disease ([place])   . Coronary artery disease    sees Dr. Verl Blalock  . DJD (degenerative joint disease)    right hip  . GERD (gastroesophageal reflux disease)    hx of  . Glaucoma   . Glucose intolerance (impaired glucose tolerance)   . Headache(784.0)    hx of migraines  . Herpes zoster 2009   r. scalp and opth  . Hx of colonic polyps 2004   hyperplastic  . Hyperlipidemia   . Hypertension    sees Dr. Adelene Amas  . Internal and external hemorrhoids without complication   . Low back pain    facet arthropathy  . Lumbago   . Peripheral vascular disease ([place])   . Pneumonia 2009   rll with a small effusion  . Postherpetic neuralgia 2009   Past Surgical History:  Procedure Laterality Date  . ACNE CYST REMOVAL  1970's   removed x2 off the back  . CARPAL TUNNEL RELEASE     left  . CATARACT EXTRACTION W/PHACO  02/13/2012   Procedure: CATARACT EXTRACTION PHACO AND INTRAOCULAR LENS PLACEMENT (IOC);  Surgeon: Adonis Brook, MD;  Location: [place];  Service: Ophthalmology;  Laterality: Left;  . COLONOSCOPY    . CORONARY ARTERY BYPASS GRAFT  2002  . coronary artery disease     stent 2010  . REPOSITION OF LENS  03/14/2012   Procedure: REPOSITION OF LENS;  Surgeon: Adonis Brook, MD;  Location: [facility];  Service: Ophthalmology;  Laterality: Left;  Reposition Ocular Lens Left Eye  . thumb tendon surgery     left  . TONSILLECTOMY     age 52    reports that he has quit smoking. He has a 18.75 pack-year smoking history. He has never used smokeless tobacco. He reports that he drinks alcohol. He reports that he does not use drugs. family history includes Breast cancer in his mother; Heart disease in his father. Allergies  Allergen Reactions  . Benazepril Hcl Cough  . Cosopt [Dorzolamide Hcl-Timolol Mal] Other (See Comments)    Eyes water  . Ezetimibe-Simvastatin Other (See Comments)    REACTION: aches  . Lipitor [Atorvastatin] Other (See Comments)    Muscle aches   Current Outpatient Prescriptions on File Prior to Visit  Medication Sig Dispense Refill  . amLODipine (NORVASC) 5 MG tablet TAKE 1 TABLET DAILY 90 tablet 3  .  Cholecalciferol (VITAMIN D3) 1000 UNITS CAPS Take 1 capsule by mouth daily.      . clopidogrel (PLAVIX) 75 MG tablet Take 1 tablet (75 mg total) by mouth daily. 90 tablet 3  . ezetimibe (ZETIA) 10 MG tablet Take 1 tablet (10 mg total) by mouth daily. 90 tablet 3  . isosorbide mononitrate (IMDUR) 60 MG 24 hr tablet TAKE 1 TABLET DAILY 90 tablet 3  . losartan (COZAAR) 100 MG tablet Take 1 tablet (100 mg total) by mouth daily. 90 tablet 3  . metoprolol-hydrochlorothiazide (LOPRESSOR HCT) 100-25 MG tablet TAKE 1 TABLET DAILY 90 tablet 3  . nitroGLYCERIN (NITROSTAT) 0.4 MG SL tablet Place 1 tablet (0.4 mg total) under the tongue every 5 (five) minutes as needed. For chest pain 20 tablet 3  . rosuvastatin (CRESTOR) 20 MG tablet TAKE 1 TABLET DAILY 90 tablet 3  . TRAVATAN Z 0.004 % SOLN ophthalmic  solution Place 1 drop into both eyes daily.    . travoprost, benzalkonium, (TRAVATAN) 0.004 % ophthalmic solution Place 1 drop into both eyes at bedtime.     Marland Kitchen aspirin 325 MG tablet Take 325 mg by mouth daily.     . meloxicam (MOBIC) 7.5 MG tablet Take 1 tablet (7.5 mg total) by mouth daily. (Patient not taking: Reported on 12/21/2015) 30 tablet 0  . Polyethyl Glycol-Propyl Glycol (SYSTANE OP) Place 1 drop into both eyes as needed. For dry eyes    . prednisoLONE acetate (PRED FORTE) 1 % ophthalmic suspension     . PrednisoLONE Sodium Phosphate (PREDNISOL OP) Place 1 drop into the left eye as needed.      No current facility-administered medications on file prior to visit.    Review of Systems  Constitutional: Negative for unusual diaphoresis or night sweats HENT: Negative for ear swelling or discharge Eyes: Negative for worsening visual haziness  Respiratory: Negative for choking and stridor.   Gastrointestinal: Negative for distension or worsening eructation Genitourinary: Negative for retention or change in urine volume.  Musculoskeletal: Negative for other MSK pain or swelling Skin: Negative for color change and worsening wound Neurological: Negative for tremors and numbness other than noted  Psychiatric/Behavioral: Negative for decreased concentration or agitation other than above       Objective:   Physical Exam BP 118/68   Pulse (!) 52   Temp 98.2 F (36.8 C) (Oral)   Resp 20   Wt 145 lb (65.8 kg)   SpO2 98%   BMI 22.05 kg/m  VS noted, not ill appearing Constitutional: Pt appears in no apparent distress HENT: Head: NCAT.  Right Ear: External ear normal.  Left Ear: External ear normal.  Bilat tm's with mild erythema.  Max sinus areas non tender.  Pharynx with mild erythema, no exudate Eyes: . Pupils are equal, round, and reactive to light. Conjunctivae and EOM are normal Neck: Normal range of motion. Neck supple.  Cardiovascular: Normal rate and regular rhythm.     Pulmonary/Chest: Effort normal and breath sounds without rales or wheezing.  Abd:  Soft, NT, ND, + BS - benign exam Neurological: Pt is alert. Not confused , motor grossly intact Skin: Skin is warm. No rash, no LE edema Psychiatric: Pt behavior is normal. No agitation.     Assessment & Plan:

## 2015-12-21 NOTE — Assessment & Plan Note (Signed)
Etiology unclear, for cxr and labs ordered, I suspect likely to be considered for EGD per GI, has appt tomorrow

## 2015-12-21 NOTE — Patient Instructions (Addendum)
You had the steroid shot today  Please continue all other medications as before, and refills have been done if requested.  Please have the pharmacy call with any other refills you may need.  Please keep your appointments with your specialists as you may have planned - Gastroenterology tomorrow  Please go to the XRAY Department in the Basement (go straight as you get off the elevator) for the x-ray testing  Please go to the LAB in the Basement (turn left off the elevator) for the tests to be done tomorrow  You will be contacted by phone if any changes need to be made immediately.  Otherwise, you will receive a letter about your results with an explanation, but please check with MyChart first.  Please remember to sign up for MyChart if you have not done so, as this will be important to you in the future with finding out test results, communicating by private email, and scheduling acute appointments online when needed.

## 2015-12-21 NOTE — Telephone Encounter (Signed)
Patient is rescheduled to tomorrow with Ellouise Newer, PA

## 2015-12-22 ENCOUNTER — Other Ambulatory Visit (INDEPENDENT_AMBULATORY_CARE_PROVIDER_SITE_OTHER): Payer: Medicare Other

## 2015-12-22 ENCOUNTER — Encounter (INDEPENDENT_AMBULATORY_CARE_PROVIDER_SITE_OTHER): Payer: Self-pay

## 2015-12-22 ENCOUNTER — Telehealth: Payer: Self-pay | Admitting: Emergency Medicine

## 2015-12-22 ENCOUNTER — Encounter: Payer: Self-pay | Admitting: Physician Assistant

## 2015-12-22 ENCOUNTER — Encounter: Payer: Self-pay | Admitting: Internal Medicine

## 2015-12-22 ENCOUNTER — Ambulatory Visit (INDEPENDENT_AMBULATORY_CARE_PROVIDER_SITE_OTHER): Payer: Medicare Other | Admitting: Physician Assistant

## 2015-12-22 VITALS — BP 80/30 | HR 56 | Ht 67.0 in | Wt 143.8 lb

## 2015-12-22 DIAGNOSIS — I2510 Atherosclerotic heart disease of native coronary artery without angina pectoris: Secondary | ICD-10-CM

## 2015-12-22 DIAGNOSIS — J309 Allergic rhinitis, unspecified: Secondary | ICD-10-CM | POA: Diagnosis not present

## 2015-12-22 DIAGNOSIS — R6881 Early satiety: Secondary | ICD-10-CM

## 2015-12-22 DIAGNOSIS — R634 Abnormal weight loss: Secondary | ICD-10-CM | POA: Diagnosis not present

## 2015-12-22 DIAGNOSIS — R197 Diarrhea, unspecified: Secondary | ICD-10-CM | POA: Diagnosis not present

## 2015-12-22 DIAGNOSIS — K591 Functional diarrhea: Secondary | ICD-10-CM

## 2015-12-22 DIAGNOSIS — D649 Anemia, unspecified: Secondary | ICD-10-CM | POA: Diagnosis not present

## 2015-12-22 LAB — URINALYSIS, ROUTINE W REFLEX MICROSCOPIC
Bilirubin Urine: NEGATIVE
Hgb urine dipstick: NEGATIVE
KETONES UR: NEGATIVE
Leukocytes, UA: NEGATIVE
Nitrite: NEGATIVE
PH: 5.5 (ref 5.0–8.0)
RBC / HPF: NONE SEEN (ref 0–?)
SPECIFIC GRAVITY, URINE: 1.02 (ref 1.000–1.030)
Total Protein, Urine: NEGATIVE
Urine Glucose: NEGATIVE
Urobilinogen, UA: 0.2 (ref 0.0–1.0)

## 2015-12-22 LAB — IRON AND TIBC
%SAT: 10 % — AB (ref 15–60)
IRON: 50 ug/dL (ref 50–180)
TIBC: 478 ug/dL — ABNORMAL HIGH (ref 250–425)
UIBC: 428 ug/dL — AB (ref 125–400)

## 2015-12-22 LAB — HEPATIC FUNCTION PANEL
ALK PHOS: 81 U/L (ref 39–117)
ALT: 12 U/L (ref 0–53)
AST: 19 U/L (ref 0–37)
Albumin: 4.3 g/dL (ref 3.5–5.2)
BILIRUBIN DIRECT: 0.2 mg/dL (ref 0.0–0.3)
BILIRUBIN TOTAL: 0.9 mg/dL (ref 0.2–1.2)
TOTAL PROTEIN: 7.3 g/dL (ref 6.0–8.3)

## 2015-12-22 LAB — CBC WITH DIFFERENTIAL/PLATELET
BASOS PCT: 0.3 % (ref 0.0–3.0)
Basophils Absolute: 0 10*3/uL (ref 0.0–0.1)
EOS PCT: 5.1 % — AB (ref 0.0–5.0)
Eosinophils Absolute: 0.4 10*3/uL (ref 0.0–0.7)
HEMATOCRIT: 33.2 % — AB (ref 39.0–52.0)
Hemoglobin: 11 g/dL — ABNORMAL LOW (ref 13.0–17.0)
LYMPHS ABS: 1.6 10*3/uL (ref 0.7–4.0)
LYMPHS PCT: 22.5 % (ref 12.0–46.0)
MCHC: 33.1 g/dL (ref 30.0–36.0)
MCV: 76.8 fl — AB (ref 78.0–100.0)
MONOS PCT: 11.8 % (ref 3.0–12.0)
Monocytes Absolute: 0.8 10*3/uL (ref 0.1–1.0)
NEUTROS ABS: 4.2 10*3/uL (ref 1.4–7.7)
NEUTROS PCT: 60.3 % (ref 43.0–77.0)
PLATELETS: 267 10*3/uL (ref 150.0–400.0)
RBC: 4.32 Mil/uL (ref 4.22–5.81)
RDW: 16.3 % — AB (ref 11.5–15.5)
WBC: 6.9 10*3/uL (ref 4.0–10.5)

## 2015-12-22 LAB — BASIC METABOLIC PANEL
BUN: 46 mg/dL — ABNORMAL HIGH (ref 6–23)
CALCIUM: 9.5 mg/dL (ref 8.4–10.5)
CO2: 19 meq/L (ref 19–32)
Chloride: 110 mEq/L (ref 96–112)
Creatinine, Ser: 2.32 mg/dL — ABNORMAL HIGH (ref 0.40–1.50)
GFR: 34.92 mL/min — AB (ref >60.00)
Glucose, Bld: 65 mg/dL — ABNORMAL LOW (ref 70–99)
Potassium: 4.2 mEq/L (ref 3.5–5.1)
SODIUM: 139 meq/L (ref 135–145)

## 2015-12-22 LAB — FERRITIN: FERRITIN: 9 ng/mL — AB (ref 22.0–322.0)

## 2015-12-22 LAB — IBC PANEL
IRON: 50 ug/dL (ref 42–165)
Saturation Ratios: 9.3 % — ABNORMAL LOW (ref 20.0–50.0)
Transferrin: 386 mg/dL — ABNORMAL HIGH (ref 212.0–360.0)

## 2015-12-22 LAB — TSH: TSH: 1.34 u[IU]/mL (ref 0.35–4.50)

## 2015-12-22 NOTE — Progress Notes (Signed)
Chief Complaint: Anemia, weight loss, diarrhea, early satiety  HPI:  [name] [name] is an 82 year old African-American male, with past medical history of multiple medical problems as below, including coronary artery disease for which the patient is on Plavix, who presents to clinic today, accompanied by his daughter, who was referred to me by Plotnikov, Evie Lacks, MD for a complaint of anemia, weight loss, diarrhea and early satiety.  Patient follows with Dr. Carlean Purl for his screening colonoscopies and was last seen in 2004. Colonoscopy at that time showed a hyperplastic polyp and hemorrhoids, patient was told to return for screening in 10 years.   Most recent labs completed 08/17/15 show a CBC with a hemoglobin low at 9.8 compared to 12.3 a year previously, normal MCV, BMP was within normal limits, LFTs were within normal limits, ESR was elevated at 26 and TSH was normal. Patient did have labs ordered by his PCP yesterday, but has not gotten them done yet. Recent ultrasound was completed on 08/31/15 and showed cholelithiasis.   Per chart review patient weighed 164 pounds back in February of this year and is now at 143.   Today, the patient's daughter does assist with this history. She explains that the patient was not eating at all until he came to live with her a month or 2 ago and still has a decreased appetite and a change in taste which has been going on over the past year. He has a weight loss of 20 pounds and she has had to buy him all new clothes. The patient also tells me that he feels "full very fast", this has been going on for quite some time.     Most recently, the patient experienced an acute onset of loose watery stool which has been occurring over the past 4-5 days. Patient does have stool studies ordered which were not returned as of yet. He actually tells me that this has been slightly improving over the past 2 days, although his daughter tells me that she did give him an Imodium yesterday.  Patient does describe some nocturnal symptoms of diarrhea and urgency, he describes at least 4-5 loose stools throughout the day. Associated symptoms include some epigastric/abdominal discomfort, patient describes this as more of a "gas pain", that he can feel moving from his epigastrium over to his "descending colon", before he has a bowel movement. This is typically relieved after a bowel movement.   The patient's daughter is concerned in regards to how much blood pressure medication the patient is on as he seems to have a very low blood pressure normally.   The patient also tells me that he is somewhat dizzy today, though he did take his blood pressure medicine this morning and "I'm not supposed to do that anymore".  Patient denies fever, chills, blood in his stool, melena, change in diet, change in medications, sick contacts, recent travel, nausea, vomiting, heartburn, reflux, dysphagia or increasing gas or bloat.    Past Medical History:  Diagnosis Date  . Anxiety   . Arthritis   . Carotid artery disease ([place])   . Coronary artery disease    sees Dr. Verl Blalock  . Depression   . DJD (degenerative joint disease)    right hip  . GERD (gastroesophageal reflux disease)    hx of  . Glaucoma   . Glucose intolerance (impaired glucose tolerance)   . Headache(784.0)    hx of migraines  . Herpes zoster 2009   r. scalp and opth  .  Hx of colonic polyps 2004   hyperplastic  . Hyperlipidemia   . Hypertension    sees Dr. Adelene Amas  . Internal and external hemorrhoids without complication   . Low back pain    facet arthropathy  . Lumbago   . Peripheral vascular disease ([place])   . Pneumonia 2009   rll with a small effusion  . Postherpetic neuralgia 2009    Past Surgical History:  Procedure Laterality Date  . ACNE CYST REMOVAL  1970's   removed x2 off the back  . CARPAL TUNNEL RELEASE     left  . CATARACT EXTRACTION W/PHACO  02/13/2012   Procedure: CATARACT EXTRACTION PHACO AND  INTRAOCULAR LENS PLACEMENT (IOC);  Surgeon: Adonis Brook, MD;  Location: [place];  Service: Ophthalmology;  Laterality: Left;  . COLONOSCOPY    . CORONARY ARTERY BYPASS GRAFT  2002  . REPOSITION OF LENS  03/14/2012   Procedure: REPOSITION OF LENS;  Surgeon: Adonis Brook, MD;  Location: [place];  Service: Ophthalmology;  Laterality: Left;  Reposition Ocular Lens Left Eye  . thumb tendon surgery     left  . TONSILLECTOMY     age 89    Current Outpatient Prescriptions  Medication Sig Dispense Refill  . amLODipine (NORVASC) 5 MG tablet TAKE 1 TABLET DAILY 90 tablet 3  . aspirin 325 MG tablet Take 325 mg by mouth daily.     . Cholecalciferol (VITAMIN D3) 1000 UNITS CAPS Take 1 capsule by mouth daily.      . clopidogrel (PLAVIX) 75 MG tablet Take 1 tablet (75 mg total) by mouth daily. 90 tablet 3  . ezetimibe (ZETIA) 10 MG tablet Take 1 tablet (10 mg total) by mouth daily. 90 tablet 3  . isosorbide mononitrate (IMDUR) 60 MG 24 hr tablet TAKE 1 TABLET DAILY 90 tablet 3  . losartan (COZAAR) 100 MG tablet Take 1 tablet (100 mg total) by mouth daily. 90 tablet 3  . meloxicam (MOBIC) 7.5 MG tablet Take 1 tablet (7.5 mg total) by mouth daily. 30 tablet 0  . metoprolol-hydrochlorothiazide (LOPRESSOR HCT) 100-25 MG tablet TAKE 1 TABLET DAILY 90 tablet 3  . nitroGLYCERIN (NITROSTAT) 0.4 MG SL tablet Place 1 tablet (0.4 mg total) under the tongue every 5 (five) minutes as needed. For chest pain 20 tablet 3  . Polyethyl Glycol-Propyl Glycol (SYSTANE OP) Place 1 drop into both eyes as needed. For dry eyes    . prednisoLONE acetate (PRED FORTE) 1 % ophthalmic suspension     . PrednisoLONE Sodium Phosphate (PREDNISOL OP) Place 1 drop into the left eye as needed.     . rosuvastatin (CRESTOR) 20 MG tablet TAKE 1 TABLET DAILY 90 tablet 3  . TRAVATAN Z 0.004 % SOLN ophthalmic solution Place 1 drop into both eyes daily.    . travoprost, benzalkonium, (TRAVATAN) 0.004 % ophthalmic solution Place 1 drop into both eyes  at bedtime.      No current facility-administered medications for this visit.     Allergies as of 12/22/2015 - Review Complete 12/22/2015  Allergen Reaction Noted  . Benazepril hcl Cough 03/31/2010  . Cosopt [dorzolamide hcl-timolol mal] Other (See Comments) 09/08/2013  . Ezetimibe-simvastatin Other (See Comments) 05/01/2007  . Lipitor [atorvastatin] Other (See Comments) 09/08/2013    Family History  Problem Relation Age of Onset  . Breast cancer Mother   . Heart disease Father   . Colon cancer Neg Hx   . Esophageal cancer Neg Hx     Social History  Social History  . Marital status: Divorced    Spouse name: N/A  . Number of children: 3  . Years of education: N/A   Occupational History  .  Retired   Social History Main Topics  . Smoking status: Former Smoker    Packs/day: 0.75    Years: 25.00  . Smokeless tobacco: Never Used     Comment: quit at age 39  . Alcohol use 0.0 oz/week     Comment: infrequent glass of wine  . Drug use: No  . Sexual activity: Not Currently   Other Topics Concern  . Not on file   Social History Narrative  . No narrative on file    Review of Systems:    Constitutional: Positive for weight loss and weakness No fever or chills HEENT: Eyes: No change in vision               Ears, Nose, Throat:  No change in hearing or congestion Skin: No rash or itching Cardiovascular: No chest pain, chest pressure or palpitations Respiratory: No SOB or cough Gastrointestinal: See HPI and otherwise negative Genitourinary: No dysuria or change in urinary frequency Neurological: Positive for dizziness No headache or syncope Musculoskeletal: No new muscle or joint pain Hematologic: Positive for anemia No bleeding Psychiatric: No history of depression or anxiety   Physical Exam:  Vital signs: BP (!) 80/30   Pulse (!) 56   Ht 5' 7"  (1.702 m)   Wt 143 lb 12.8 oz (65.2 kg)   BMI 22.52 kg/m   General:   Pleasant Elderly African-American male  appears to be in NAD, Well developed, Well nourished, alert and cooperative Head:  Normocephalic and atraumatic. Eyes:   PEERL, EOMI. No icterus. Conjunctiva pink. Ears:  Normal auditory acuity. Neck:  Supple Throat: Oral cavity and pharynx without inflammation, swelling or lesion.  Lungs: Respirations even and unlabored. Lungs clear to auscultation bilaterally.   No wheezes, crackles, or rhonchi.  Heart: Normal S1, S2. No MRG. Regular rate and rhythm. No peripheral edema, cyanosis or pallor.  Abdomen:  Soft, nondistended, nontender. No rebound or guarding. Normal bowel sounds. No appreciable masses or hepatomegaly. Rectal:  Not performed.  Msk:  Symmetrical without gross deformities.  Extremities:  Without edema, no deformity or joint abnormality.  Neurologic:  Alert and  oriented x4;  grossly normal neurologically.  Skin:   Dry and intact without significant lesions or rashes. Psychiatric: Oriented to person, place and time. Demonstrates good judgement and reason without abnormal affect or behaviors.  RELEVANT LABS AND IMAGING: CBC    Component Value Date/Time   WBC 4.8 08/17/2015 1513   RBC 3.65 (L) 08/17/2015 1513   HGB 9.8 (L) 08/17/2015 1513   HCT 29.6 (L) 08/17/2015 1513   PLT 221.0 08/17/2015 1513   MCV 81.2 08/17/2015 1513   MCH 30.0 03/12/2012 1349   MCHC 33.0 08/17/2015 1513   RDW 14.9 08/17/2015 1513   LYMPHSABS 1.2 08/17/2015 1513   MONOABS 0.5 08/17/2015 1513   EOSABS 0.3 08/17/2015 1513   BASOSABS 0.0 08/17/2015 1513    CMP     Component Value Date/Time   NA 140 08/17/2015 1513   K 4.0 08/17/2015 1513   CL 107 08/17/2015 1513   CO2 26 08/17/2015 1513   GLUCOSE 95 08/17/2015 1513   BUN 27 (H) 08/17/2015 1513   CREATININE 1.25 08/17/2015 1513   CALCIUM 9.1 08/17/2015 1513   PROT 6.3 08/17/2015 1513   ALBUMIN 3.9 08/17/2015 1513   AST  28 08/17/2015 1513   ALT 17 08/17/2015 1513   ALKPHOS 55 08/17/2015 1513   BILITOT 0.9 08/17/2015 1513   GFRNONAA 63 (L)  03/12/2012 1349   GFRAA 73 (L) 03/12/2012 1349    Assessment: 1. Anemia: Per labs on 08/17/15 hemoglobin 9.8, repeat labs are ordered and iron studies were added, symptoms of early satiety and diarrhea; consider upper versus lower GI source of blood loss versus hematologic cause 2. Weight loss: A total of 20 pounds without trying since February along with decreased appetite 3. Early satiety: Patient reports feeling "full fast", this has been going on for over 6 months; consider gastritis versus H. pylori versus other 4. Diarrhea: Acute onset over the past 4-5 days, actually some improvement over the past 1-2 days, stool studies ordered  Plan: 1. Recommend patient proceed with labs ordered yesterday by PCP including CBC, BMP, hepatic function panel, TSH, urinalysis, GI pathogen panel, stool culture, celiac studies and reticulin antibody. Will add iron studies and C. difficile. 2. Discussed with the patient and his daughter that due to weight loss and anemia, would recommend an EGD and colonoscopy for further evaluation. Discussed risks, benefits, limitations and alternatives this procedure and the patient agrees to proceed. This was scheduled with Dr. Carlean Purl. 3. Patient was advised to hold his Plavix for 5 days prior to the procedure. We will communicate with his cardiologist/primary care physician to ensure that holding his Plavix is acceptable. 4. Patient should supplement his diet with ensure/Boost shakes. 5. Patient to follow up in clinic per Dr. Celesta Aver recommendations after time of procedures. 6. Discussed hypotension today and dizziness, recommend patient hold blood pressure medications for now and contact his PCP for titration of these in the future. Recommend his daughter keep track of blood pressure twice daily until further recs from them.   Ellouise Newer, PA-C [facility] 12/22/2015, 2:04 PM  Cc: Cassandria Anger, MD

## 2015-12-22 NOTE — Patient Instructions (Signed)
You have been scheduled for an endoscopy and colonoscopy. Please follow the written instructions given to you at your visit today. Please pick up your prep supplies at the pharmacy within the next 1-3 days. If you use inhalers (even only as needed), please bring them with you on the day of your procedure. Your physician has requested that you go to www.startemmi.com and enter the access code given to you at your visit today. This web site gives a general overview about your procedure. However, you should still follow specific instructions given to you by our office regarding your preparation for the procedure.  Your physician has requested that you go to the basement for lab work before leaving today. 

## 2015-12-22 NOTE — Telephone Encounter (Signed)
OK to stop per your protocol Thx

## 2015-12-22 NOTE — Telephone Encounter (Signed)
12/22/2015   RE: NATHANIE DETTLING DOB: October 06, 1932 MRN: [id number]   Dear Dr. Alain Marion,    We have scheduled the above patient for an endoscopic procedure. Our records show that he is on anticoagulation therapy.   Please advise as to how long the patient may come off his therapy of Plavix prior to the procedure, which is scheduled for 01/12/16.  Please fax back/ or route the completed form to [facility].   Sincerely,    Tinnie Gens

## 2015-12-23 ENCOUNTER — Inpatient Hospital Stay
Admission: EM | Admit: 2015-12-23 | Discharge: 2015-12-25 | DRG: 684 | Disposition: A | Discharge status: Home / Self Care | Payer: Medicare Other | Attending: Internal Medicine | Admitting: Internal Medicine

## 2015-12-23 ENCOUNTER — Telehealth: Payer: Self-pay | Admitting: Internal Medicine

## 2015-12-23 DIAGNOSIS — Z86010 Personal history of colonic polyps: Secondary | ICD-10-CM | POA: Diagnosis not present

## 2015-12-23 DIAGNOSIS — Z961 Presence of intraocular lens: Secondary | ICD-10-CM | POA: Diagnosis present

## 2015-12-23 DIAGNOSIS — M545 Low back pain: Secondary | ICD-10-CM | POA: Diagnosis present

## 2015-12-23 DIAGNOSIS — Z8249 Family history of ischemic heart disease and other diseases of the circulatory system: Secondary | ICD-10-CM | POA: Diagnosis not present

## 2015-12-23 DIAGNOSIS — N170 Acute kidney failure with tubular necrosis: Secondary | ICD-10-CM | POA: Diagnosis present

## 2015-12-23 DIAGNOSIS — I959 Hypotension, unspecified: Secondary | ICD-10-CM | POA: Diagnosis not present

## 2015-12-23 DIAGNOSIS — Z87891 Personal history of nicotine dependence: Secondary | ICD-10-CM | POA: Diagnosis not present

## 2015-12-23 DIAGNOSIS — B9620 Unspecified Escherichia coli [E. coli] as the cause of diseases classified elsewhere: Secondary | ICD-10-CM | POA: Diagnosis present

## 2015-12-23 DIAGNOSIS — H409 Unspecified glaucoma: Secondary | ICD-10-CM | POA: Diagnosis present

## 2015-12-23 DIAGNOSIS — E785 Hyperlipidemia, unspecified: Secondary | ICD-10-CM | POA: Diagnosis present

## 2015-12-23 DIAGNOSIS — R197 Diarrhea, unspecified: Secondary | ICD-10-CM | POA: Diagnosis present

## 2015-12-23 DIAGNOSIS — R634 Abnormal weight loss: Secondary | ICD-10-CM | POA: Diagnosis present

## 2015-12-23 DIAGNOSIS — K219 Gastro-esophageal reflux disease without esophagitis: Secondary | ICD-10-CM | POA: Diagnosis present

## 2015-12-23 DIAGNOSIS — I739 Peripheral vascular disease, unspecified: Secondary | ICD-10-CM | POA: Diagnosis present

## 2015-12-23 DIAGNOSIS — I2510 Atherosclerotic heart disease of native coronary artery without angina pectoris: Secondary | ICD-10-CM | POA: Diagnosis present

## 2015-12-23 DIAGNOSIS — N19 Unspecified kidney failure: Secondary | ICD-10-CM | POA: Diagnosis not present

## 2015-12-23 DIAGNOSIS — N179 Acute kidney failure, unspecified: Secondary | ICD-10-CM | POA: Diagnosis not present

## 2015-12-23 DIAGNOSIS — Z951 Presence of aortocoronary bypass graft: Secondary | ICD-10-CM

## 2015-12-23 DIAGNOSIS — Z9842 Cataract extraction status, left eye: Secondary | ICD-10-CM

## 2015-12-23 DIAGNOSIS — Z803 Family history of malignant neoplasm of breast: Secondary | ICD-10-CM

## 2015-12-23 DIAGNOSIS — M1990 Unspecified osteoarthritis, unspecified site: Secondary | ICD-10-CM | POA: Diagnosis present

## 2015-12-23 DIAGNOSIS — I10 Essential (primary) hypertension: Secondary | ICD-10-CM | POA: Diagnosis present

## 2015-12-23 DIAGNOSIS — Z79899 Other long term (current) drug therapy: Secondary | ICD-10-CM

## 2015-12-23 DIAGNOSIS — Z888 Allergy status to other drugs, medicaments and biological substances status: Secondary | ICD-10-CM | POA: Diagnosis not present

## 2015-12-23 LAB — CBC WITH DIFFERENTIAL/PLATELET
Basophils Absolute: 0 10*3/uL (ref 0–0.1)
Basophils Relative: 0 %
EOS ABS: 0.5 10*3/uL (ref 0–0.7)
EOS PCT: 7 %
HCT: 34.5 % — ABNORMAL LOW (ref 40.0–52.0)
HEMOGLOBIN: 11.2 g/dL — AB (ref 13.0–18.0)
LYMPHS ABS: 1.7 10*3/uL (ref 1.0–3.6)
LYMPHS PCT: 25 %
MCH: 25.4 pg — ABNORMAL LOW (ref 26.0–34.0)
MCHC: 32.5 g/dL (ref 32.0–36.0)
MCV: 77.9 fL — AB (ref 80.0–100.0)
MONOS PCT: 10 %
Monocytes Absolute: 0.7 10*3/uL (ref 0.2–1.0)
Neutro Abs: 3.9 10*3/uL (ref 1.4–6.5)
Neutrophils Relative %: 58 %
PLATELETS: 246 10*3/uL (ref 150–440)
RBC: 4.42 MIL/uL (ref 4.40–5.90)
RDW: 15.8 % — ABNORMAL HIGH (ref 11.5–14.5)
WBC: 6.7 10*3/uL (ref 3.8–10.6)

## 2015-12-23 LAB — COMPREHENSIVE METABOLIC PANEL
ALK PHOS: 76 U/L (ref 38–126)
ALT: 13 U/L — AB (ref 17–63)
AST: 23 U/L (ref 15–41)
Albumin: 4.1 g/dL (ref 3.5–5.0)
Anion gap: 8 (ref 5–15)
BUN: 50 mg/dL — AB (ref 6–20)
CHLORIDE: 112 mmol/L — AB (ref 101–111)
CO2: 18 mmol/L — AB (ref 22–32)
CREATININE: 2.06 mg/dL — AB (ref 0.61–1.24)
Calcium: 9.4 mg/dL (ref 8.9–10.3)
GFR calc Af Amer: 33 mL/min — ABNORMAL LOW (ref >60)
GFR, EST NON AFRICAN AMERICAN: 29 mL/min — AB (ref >60)
Glucose, Bld: 92 mg/dL (ref 65–99)
Potassium: 3.8 mmol/L (ref 3.5–5.1)
SODIUM: 138 mmol/L (ref 135–145)
Total Bilirubin: 1 mg/dL (ref 0.3–1.2)
Total Protein: 7.1 g/dL (ref 6.5–8.1)

## 2015-12-23 LAB — URINALYSIS COMPLETE WITH MICROSCOPIC (ARMC ONLY)
BACTERIA UA: NONE SEEN
Bilirubin Urine: NEGATIVE
Glucose, UA: NEGATIVE mg/dL
Hgb urine dipstick: NEGATIVE
Ketones, ur: NEGATIVE mg/dL
Leukocytes, UA: NEGATIVE
Nitrite: NEGATIVE
PROTEIN: NEGATIVE mg/dL
RBC / HPF: NONE SEEN RBC/hpf (ref 0–5)
Specific Gravity, Urine: 1.005 (ref 1.005–1.030)
Squamous Epithelial / LPF: NONE SEEN
pH: 5 (ref 5.0–8.0)

## 2015-12-23 LAB — TISSUE TRANSGLUTAMINASE, IGA: TISSUE TRANSGLUTAMINASE AB, IGA: 1 U/mL (ref <4)

## 2015-12-23 LAB — GLIADIN ANTIBODIES, SERUM
GLIADIN IGG: 2 U (ref <20)
Gliadin IgA: 2 Units (ref <20)

## 2015-12-23 MED ORDER — ENOXAPARIN SODIUM 40 MG/0.4ML ~~LOC~~ SOLN: 30.0000 mg | SUBCUTANEOUS | Status: DC

## 2015-12-23 MED ORDER — ACETAMINOPHEN 325 MG PO TABS: 650.0000 mg | ORAL_TABLET | Freq: Four times a day (QID) | ORAL | Status: DC | PRN

## 2015-12-23 MED ORDER — EZETIMIBE 10 MG PO TABS
10.0000 mg | ORAL_TABLET | Freq: Every day | ORAL | Status: DC
  Administered 2015-12-24 – 2015-12-25 (×2): 10 mg via ORAL
  Filled 2015-12-23 (×3): qty 1

## 2015-12-23 MED ORDER — ROSUVASTATIN CALCIUM 20 MG PO TABS
20.0000 mg | ORAL_TABLET | Freq: Every day | ORAL | Status: DC
  Administered 2015-12-24 – 2015-12-25 (×2): 20 mg via ORAL
  Filled 2015-12-23 (×2): qty 1

## 2015-12-23 MED ORDER — ACETAMINOPHEN 650 MG RE SUPP
650.0000 mg | Freq: Four times a day (QID) | RECTAL | Status: DC | PRN
Start: 2015-12-23 — End: 2015-12-25

## 2015-12-23 MED ORDER — NITROGLYCERIN 0.4 MG SL SUBL: 0.4000 mg | SUBLINGUAL_TABLET | SUBLINGUAL | Status: DC | PRN

## 2015-12-23 MED ORDER — SODIUM CHLORIDE 0.9 % IV SOLN
INTRAVENOUS | Status: DC
  Administered 2015-12-24 – 2015-12-25 (×3): via INTRAVENOUS

## 2015-12-23 MED ORDER — CLOPIDOGREL BISULFATE 75 MG PO TABS
75.0000 mg | ORAL_TABLET | Freq: Every day | ORAL | Status: DC
  Administered 2015-12-24 – 2015-12-25 (×2): 75 mg via ORAL
  Filled 2015-12-23 (×2): qty 1

## 2015-12-23 MED ORDER — VITAMIN D 1000 UNITS PO TABS
1000.0000 [IU] | ORAL_TABLET | Freq: Every day | ORAL | Status: DC
  Administered 2015-12-24 – 2015-12-25 (×2): 1000 [IU] via ORAL
  Filled 2015-12-23 (×2): qty 1

## 2015-12-23 MED ORDER — CYCLOSPORINE 0.05 % OP EMUL
1.0000 [drp] | Freq: Two times a day (BID) | OPHTHALMIC | Status: DC
  Administered 2015-12-24 – 2015-12-25 (×4): 1 [drp] via OPHTHALMIC
  Filled 2015-12-23 (×5): qty 1

## 2015-12-23 MED ORDER — LATANOPROST 0.005 % OP SOLN
1.0000 [drp] | Freq: Every day | OPHTHALMIC | Status: DC
  Administered 2015-12-24 (×2): 1 [drp] via OPHTHALMIC
  Filled 2015-12-23: qty 2.5

## 2015-12-23 MED ORDER — SODIUM CHLORIDE 0.9 % IV BOLUS (SEPSIS)
1000.0000 mL | Freq: Once | INTRAVENOUS | Status: AC
  Administered 2015-12-23: 1000 mL via INTRAVENOUS

## 2015-12-23 NOTE — ED Provider Notes (Signed)
[facility] Emergency Department Provider Note ____________________________________________   I have reviewed the triage vital signs and the triage nursing note.  HISTORY  Chief Complaint Back Pain   Historian Patient and daughters  HPI [name] [name] is a 82 y.o. male sent here by his primary care physician after being called with the results of the kidney function test showing acute renal failure, blood drawn yesterday. Patient had an appointment for 40 pound weight loss over the past several months, and some back pain. From he describes the back pain is intermittent and occasionally goes down both legs when he points he's pointing to his sacral area more so than his flank. Denies dysuria or hematuria.  He was told that his doctor stated that his blood pressure medications may have been the cause of the acute renal failure.    Past Medical History:  Diagnosis Date  . Anxiety   . Arthritis   . Carotid artery disease ([place])   . Coronary artery disease    sees Dr. Verl Blalock  . Depression   . DJD (degenerative joint disease)    right hip  . GERD (gastroesophageal reflux disease)    hx of  . Glaucoma   . Glucose intolerance (impaired glucose tolerance)   . Headache(784.0)    hx of migraines  . Herpes zoster 2009   r. scalp and opth  . Hx of colonic polyps 2004   hyperplastic  . Hyperlipidemia   . Hypertension    sees Dr. Adelene Amas  . Internal and external hemorrhoids without complication   . Low back pain    facet arthropathy  . Lumbago   . Peripheral vascular disease ([place])   . Pneumonia 2009   rll with a small effusion  . Postherpetic neuralgia 2009    Patient Active Problem List   Diagnosis Date Noted  . Diarrhea 12/21/2015  . Asymptomatic gallstones 08/31/2015  . Loss of weight 08/17/2015  . Edema 08/17/2015  . Serous otitis media 05/20/2015  . Sebaceous cyst of left axilla 10/15/2014  . Peripheral arterial disease ([place]) 12/29/2013  .  Cerumen impaction 12/09/2013  . Pain in lower limb 08/17/2013  . Dry skin 06/10/2013  . Aortic valve stenosis 12/11/2012  . Allergic rhinitis 08/19/2012  . Onychomycosis 08/12/2012  . Pain in joint, ankle and foot 08/12/2012  . CTS (carpal tunnel syndrome) 05/13/2012  . Chronic venous insufficiency 05/13/2012  . Right bundle branch block 12/10/2011  . Pneumonia   . Cough 03/31/2010  . TOBACCO USE, QUIT 04/01/2009  . CORONARY ATHEROSCLEROSIS NATIVE CORONARY ARTERY 03/25/2009  . CAROTID ARTERY STENOSIS, WITHOUT INFARCTION 02/11/2009  . Abdominal pain, epigastric 11/17/2008  . PLEURAL EFFUSION, RIGHT 02/03/2008  . DYSPNEA 02/03/2008  . FOOT PAIN 09/22/2007  . ABNORMAL LIVER FUNCTION TESTS 09/22/2007  . POSTHERPETIC NEURALGIA 07/17/2007  . LOW BACK PAIN 07/17/2007  . COLONIC POLYPS, HX OF 07/17/2007  . SHINGLES 07/03/2007  . Dyslipidemia 05/01/2007  . Benign hypertensive heart and renal disease with renal failure 05/01/2007  . MYALGIA 05/01/2007  . ABNORMAL GLUCOSE NEC 05/01/2007    Past Surgical History:  Procedure Laterality Date  . ACNE CYST REMOVAL  1970's   removed x2 off the back  . CARPAL TUNNEL RELEASE     left  . CATARACT EXTRACTION W/PHACO  02/13/2012   Procedure: CATARACT EXTRACTION PHACO AND INTRAOCULAR LENS PLACEMENT (IOC);  Surgeon: Adonis Brook, MD;  Location: [facility];  Service: Ophthalmology;  Laterality: Left;  . COLONOSCOPY    .  CORONARY ARTERY BYPASS GRAFT  2002  . REPOSITION OF LENS  03/14/2012   Procedure: REPOSITION OF LENS;  Surgeon: Adonis Brook, MD;  Location: [place];  Service: Ophthalmology;  Laterality: Left;  Reposition Ocular Lens Left Eye  . thumb tendon surgery     left  . TONSILLECTOMY     age 42    Prior to Admission medications   Medication Sig Start Date End Date Taking? Authorizing Provider  amLODipine (NORVASC) 5 MG tablet TAKE 1 TABLET DAILY 06/20/15   Cassandria Anger, MD  aspirin 325 MG tablet Take 325 mg by mouth daily.      Historical Provider, MD  Cholecalciferol (VITAMIN D3) 1000 UNITS CAPS Take 1 capsule by mouth daily.      Historical Provider, MD  clopidogrel (PLAVIX) 75 MG tablet Take 1 tablet (75 mg total) by mouth daily. 01/27/15   Evie Lacks Plotnikov, MD  ezetimibe (ZETIA) 10 MG tablet Take 1 tablet (10 mg total) by mouth daily. 08/02/14   Cassandria Anger, MD  isosorbide mononitrate (IMDUR) 60 MG 24 hr tablet TAKE 1 TABLET DAILY 07/18/15   Cassandria Anger, MD  losartan (COZAAR) 100 MG tablet Take 1 tablet (100 mg total) by mouth daily. 01/27/15   Evie Lacks Plotnikov, MD  meloxicam (MOBIC) 7.5 MG tablet Take 1 tablet (7.5 mg total) by mouth daily. 05/20/15   Evie Lacks Plotnikov, MD  metoprolol-hydrochlorothiazide (LOPRESSOR HCT) 100-25 MG tablet TAKE 1 TABLET DAILY 06/20/15   Cassandria Anger, MD  nitroGLYCERIN (NITROSTAT) 0.4 MG SL tablet Place 1 tablet (0.4 mg total) under the tongue every 5 (five) minutes as needed. For chest pain 07/14/14   Cassandria Anger, MD  Polyethyl Glycol-Propyl Glycol (SYSTANE OP) Place 1 drop into both eyes as needed. For dry eyes    Historical Provider, MD  prednisoLONE acetate (PRED FORTE) 1 % ophthalmic suspension  11/05/12   Historical Provider, MD  PrednisoLONE Sodium Phosphate (PREDNISOL OP) Place 1 drop into the left eye as needed.     Historical Provider, MD  rosuvastatin (CRESTOR) 20 MG tablet TAKE 1 TABLET DAILY 11/08/15   Evie Lacks Plotnikov, MD  TRAVATAN Z 0.004 % SOLN ophthalmic solution Place 1 drop into both eyes daily. 06/21/15   Historical Provider, MD  travoprost, benzalkonium, (TRAVATAN) 0.004 % ophthalmic solution Place 1 drop into both eyes at bedtime.     Historical Provider, MD    Allergies  Allergen Reactions  . Benazepril Hcl Cough  . Cosopt [Dorzolamide Hcl-Timolol Mal] Other (See Comments)    Eyes water  . Ezetimibe-Simvastatin Other (See Comments)    REACTION: aches  . Lipitor [Atorvastatin] Other (See Comments)    Muscle aches    Family  History  Problem Relation Age of Onset  . Breast cancer Mother   . Heart disease Father   . Colon cancer Neg Hx   . Esophageal cancer Neg Hx     Social History Social History  Substance Use Topics  . Smoking status: Former Smoker    Packs/day: 0.75    Years: 25.00  . Smokeless tobacco: Never Used     Comment: quit at age 78  . Alcohol use 0.0 oz/week     Comment: infrequent glass of wine    Review of Systems  Constitutional: Negative for fever. Eyes: Negative for visual changes. ENT: Negative for sore throat. Cardiovascular: Negative for chest pain. Respiratory: Negative for shortness of breath. Gastrointestinal: Negative for abdominal pain, vomiting and diarrhea. Genitourinary: Negative  for dysuria. Musculoskeletal: Negative for extremity pain. Skin: Negative for rash. Neurological: Negative for headache. 10 point Review of Systems otherwise negative ____________________________________________   PHYSICAL EXAM:  VITAL SIGNS: ED Triage Vitals  Enc Vitals Group     BP 12/23/15 1733 (!) 114/48     Pulse Rate 12/23/15 1733 (!) 54     Resp 12/23/15 2030 15     Temp 12/23/15 1733 97.5 F (36.4 C)     Temp Source 12/23/15 1733 Oral     SpO2 12/23/15 1733 98 %     Weight 12/23/15 1733 143 lb (64.9 kg)     Height 12/23/15 1733 5\' 7"  (1.702 m)     Head Circumference --      Peak Flow --      Pain Score 12/23/15 1733 6     Pain Loc --      Pain Edu? --      Excl. in [place]? --      Constitutional: Alert and oriented. Well appearing and in no distress. HEENT   Head: Normocephalic and atraumatic.      Eyes: Conjunctivae are normal. PERRL. Normal extraocular movements.      Ears:         Nose: No congestion/rhinnorhea.   Mouth/Throat: Mucous membranes are moist.   Neck: No stridor. Cardiovascular/Chest: Normal rate, regular rhythm.  No murmurs, rubs, or gallops. Respiratory: Normal respiratory effort without tachypnea nor retractions. Breath sounds are  clear and equal bilaterally. No wheezes/rales/rhonchi. Gastrointestinal: Soft. No distention, no guarding, no rebound. Nontender.    Genitourinary/rectal:Deferred Musculoskeletal: Nontender with normal range of motion in all extremities. No joint effusions.  No lower extremity tenderness.  No edema. Neurologic:  Normal speech and language. No gross or focal neurologic deficits are appreciated. Skin:  Skin is warm, dry and intact. No rash noted. Psychiatric: Mood and affect are normal. Speech and behavior are normal. Patient exhibits appropriate insight and judgment.   ____________________________________________  LABS (pertinent positives/negatives)  Labs Reviewed  CBC WITH DIFFERENTIAL/PLATELET - Abnormal; Notable for the following:       Result Value   Hemoglobin 11.2 (*)    HCT 34.5 (*)    MCV 77.9 (*)    MCH 25.4 (*)    RDW 15.8 (*)    All other components within normal limits  COMPREHENSIVE METABOLIC PANEL - Abnormal; Notable for the following:    Chloride 112 (*)    CO2 18 (*)    BUN 50 (*)    Creatinine, Ser 2.06 (*)    ALT 13 (*)    GFR calc non Af Amer 29 (*)    GFR calc Af Amer 33 (*)    All other components within normal limits  URINALYSIS COMPLETEWITH MICROSCOPIC ([facility] ONLY)  UA pending  ____________________________________________    EKG I, Lisa Roca, MD, the attending physician have personally viewed and interpreted all ECGs.  None ____________________________________________  RADIOLOGY All Xrays were viewed by me. Imaging interpreted by Radiologist.  Renal ultrasound - pending. __________________________________________  PROCEDURES  Procedure(s) performed: None  Critical Care performed: None  ____________________________________________   ED COURSE / ASSESSMENT AND PLAN  Pertinent labs & imaging results that were available during my care of the patient were reviewed by me and considered in my medical decision making (see chart for  details).   Mr. Huda was told to come for eval for acute renal failure with complaints of ongoing weakness, weight loss and intermittent back pain.  I reviewed the prior  baseline creatinine around 1.2, and today creatinine is 2.06, with elevated BUN. He does deny black or bloody stools and hemoglobin is actually up at 11 from prior around 9 in terms of any concern for GI bleeding.  Stable vital signs.  Received IV fluid bolus in triage/subwait area.  I visualized his urine sample and it was extremely clear and normal in appearance, I doubt hematuria or UTI.  I am going to order a renal ultrasound. In any case, I have discussed with the hospitalist for admission.   CONSULTATIONS:   Hospitalist for admission.   Patient / Family / Caregiver informed of clinical course, medical decision-making process, and agree with plan.   ___________________________________________   FINAL CLINICAL IMPRESSION(S) / ED DIAGNOSES   Final diagnoses:  Acute renal failure, unspecified acute renal failure type [facility])              Note: This dictation was prepared with Dragon dictation. Any transcriptional errors that result from this process are unintentional    Lisa Roca, MD 12/23/15 2138

## 2015-12-23 NOTE — Telephone Encounter (Signed)
Left message on daughters voicemail

## 2015-12-23 NOTE — ED Notes (Signed)
Pt in via triage; pt sent over from PCP, states recent blood work shows kidney failure.  Pt with complaints of intermittent left lower back pain for the past couple of months.  Pt denies any pain, blood, difficulty with urination.  Pt family reports 20lb weight loss since February, currently with appointments scheduled with GI to look into that further.  Pt A/Ox4, vitals WDL, no immediate distress noted.

## 2015-12-23 NOTE — Telephone Encounter (Signed)
States that patient seen Dr. Jenny Reichmann and GI recently.  States GI took patient off of Blood Pressure Medication yesterday b/c pressure was really low.  Wants medication to be looked at.  Patient is on his way to ER.  Did inform that patient needed to schedule a ER follow up or Hospital fu once hospital has decided what to do.  Daughter states she does have POA and will fax over.

## 2015-12-23 NOTE — H&P (Signed)
[place] [place] NAME: [name] [name]    MR#:  [id number]  DATE OF BIRTH:  [birth year]  DATE OF ADMISSION:  12/23/2015  PRIMARY CARE PHYSICIAN: Walker Kehr, MD   REQUESTING/REFERRING PHYSICIAN: Dr Lisa Roca  CHIEF COMPLAINT:   Chief Complaint  Patient presents with  . Back Pain    HISTORY OF PRESENT ILLNESS:  [name] [name]  is a 82 y.o. male. Patient told to come to the hospital by his medical doctor for acute kidney injury. Patient states that he's been having diarrhea for 1 week. No blood in the bowel movements. No recent antibiotics. No travel out of the country. He is also having back pain on and off. Family states that his blood pressure is been low and he's been off balance and he has had poor appetite. He's lost a tremendous amount of weight recently. He is also felt woozy.  PAST MEDICAL HISTORY:   Past Medical History:  Diagnosis Date  . Anxiety   . Arthritis   . Carotid artery disease ([place])   . Coronary artery disease    sees Dr. Verl Blalock  . Depression   . DJD (degenerative joint disease)    right hip  . GERD (gastroesophageal reflux disease)    hx of  . Glaucoma   . Glucose intolerance (impaired glucose tolerance)   . Headache(784.0)    hx of migraines  . Herpes zoster 2009   r. scalp and opth  . Hx of colonic polyps 2004   hyperplastic  . Hyperlipidemia   . Hypertension    sees Dr. Adelene Amas  . Internal and external hemorrhoids without complication   . Low back pain    facet arthropathy  . Lumbago   . Peripheral vascular disease ([place])   . Pneumonia 2009   rll with a small effusion  . Postherpetic neuralgia 2009    PAST SURGICAL HISTORY:   Past Surgical History:  Procedure Laterality Date  . ACNE CYST REMOVAL  1970's   removed x2 off the back  . CARPAL TUNNEL RELEASE     left  . CATARACT EXTRACTION W/PHACO  02/13/2012   Procedure: CATARACT EXTRACTION PHACO AND INTRAOCULAR LENS PLACEMENT  (IOC);  Surgeon: Adonis Brook, MD;  Location: [facility];  Service: Ophthalmology;  Laterality: Left;  . COLONOSCOPY    . CORONARY ARTERY BYPASS GRAFT  2002  . REPOSITION OF LENS  03/14/2012   Procedure: REPOSITION OF LENS;  Surgeon: Adonis Brook, MD;  Location: [facility];  Service: Ophthalmology;  Laterality: Left;  Reposition Ocular Lens Left Eye  . thumb tendon surgery     left  . TONSILLECTOMY     age 82    SOCIAL HISTORY:   Social History  Substance Use Topics  . Smoking status: Former Smoker    Packs/day: 0.75    Years: 25.00  . Smokeless tobacco: Never Used     Comment: quit at age 75  . Alcohol use 0.0 oz/week     Comment: infrequent glass of wine    FAMILY HISTORY:   Family History  Problem Relation Age of Onset  . Breast cancer Mother   . Pulmonary embolism Father   . Colon cancer Neg Hx   . Esophageal cancer Neg Hx     DRUG ALLERGIES:   Allergies  Allergen Reactions  . Benazepril Hcl Cough  . Cosopt [Dorzolamide Hcl-Timolol Mal] Other (See Comments)    Eyes water  . Ezetimibe-Simvastatin Other (See Comments)  REACTION: aches  . Lipitor [Atorvastatin] Other (See Comments)    Muscle aches    REVIEW OF SYSTEMS:  CONSTITUTIONAL: No fever, Positive for weakness. Positive for weight loss EYES: No blurred or double vision. Wears glasses and has glaucoma and dry eyes. EARS, NOSE, AND THROAT: No tinnitus or ear pain. Positive for sore throat. Positive for runny nose. RESPIRATORY: No cough, shortness of breath, wheezing or hemoptysis.  CARDIOVASCULAR: No chest pain, orthopnea, edema.  GASTROINTESTINAL: No nausea, vomiting. Positive for diarrhea, abdominal cramping. No blood in bowel movements GENITOURINARY: No dysuria, hematuria.  ENDOCRINE: No polyuria, nocturia,  HEMATOLOGY: No anemia, easy bruising or bleeding SKIN: No rash or lesion. Dry skin. MUSCULOSKELETAL: Shoulder and neck pain NEUROLOGIC: Positive for dizziness PSYCHIATRY: No anxiety or depression.    MEDICATIONS AT HOME:   Prior to Admission medications   Medication Sig Start Date End Date Taking? Authorizing Provider  Cholecalciferol (VITAMIN D3) 1000 UNITS CAPS Take 1 capsule by mouth daily.     Yes Historical Provider, MD  clopidogrel (PLAVIX) 75 MG tablet Take 1 tablet (75 mg total) by mouth daily. 01/27/15  Yes Evie Lacks Plotnikov, MD  cycloSPORINE (RESTASIS) 0.05 % ophthalmic emulsion Place 1 drop into both eyes 2 (two) times daily.   Yes Historical Provider, MD  ezetimibe (ZETIA) 10 MG tablet Take 1 tablet (10 mg total) by mouth daily. 08/02/14  Yes Evie Lacks Plotnikov, MD  isosorbide mononitrate (IMDUR) 60 MG 24 hr tablet TAKE 1 TABLET DAILY 07/18/15  Yes Evie Lacks Plotnikov, MD  nitroGLYCERIN (NITROSTAT) 0.4 MG SL tablet Place 1 tablet (0.4 mg total) under the tongue every 5 (five) minutes as needed. For chest pain 07/14/14  Yes Evie Lacks Plotnikov, MD  rosuvastatin (CRESTOR) 20 MG tablet TAKE 1 TABLET DAILY 11/08/15  Yes Evie Lacks Plotnikov, MD  TRAVATAN Z 0.004 % SOLN ophthalmic solution Place 1 drop into both eyes daily. 06/21/15  Yes Historical Provider, MD  amLODipine (NORVASC) 5 MG tablet TAKE 1 TABLET DAILY Patient not taking: Reported on 12/23/2015 06/20/15   Evie Lacks Plotnikov, MD  losartan (COZAAR) 100 MG tablet Take 1 tablet (100 mg total) by mouth daily. Patient not taking: Reported on 12/23/2015 01/27/15   Evie Lacks Plotnikov, MD  meloxicam (MOBIC) 7.5 MG tablet Take 1 tablet (7.5 mg total) by mouth daily. Patient not taking: Reported on 12/23/2015 05/20/15   Cassandria Anger, MD  metoprolol-hydrochlorothiazide (LOPRESSOR HCT) 100-25 MG tablet TAKE 1 TABLET DAILY Patient not taking: Reported on 12/23/2015 06/20/15   Cassandria Anger, MD      VITAL SIGNS:  Blood pressure (!) 112/51, pulse (!) 53, temperature 97.5 F (36.4 C), temperature source Oral, resp. rate 15, height 5\' 7"  (1.702 m), weight 64.9 kg (143 lb), SpO2 100 %.  PHYSICAL EXAMINATION:  GENERAL:  82  y.o.-year-old patient lying in the bed with no acute distress.  EYES: Pupils equal, round, reactive to light and accommodation. No scleral icterus. Extraocular muscles intact.  HEENT: Head atraumatic, normocephalic. Oropharynx and nasopharynx clear.  NECK:  Supple, no jugular venous distention. No thyroid enlargement, no tenderness.  LUNGS: Normal breath sounds bilaterally, no wheezing, rales,rhonchi or crepitation. No use of accessory muscles of respiration.  CARDIOVASCULAR: S1, S2 normal. 3 and a 6 systolic murmurs, no rubs, or gallops.  ABDOMEN: Soft, nontender, nondistended. Bowel sounds present. No organomegaly or mass.  EXTREMITIES: No pedal edema, cyanosis, or clubbing.  NEUROLOGIC: Cranial nerves II through XII are intact. Muscle strength 5/5 in all extremities. Sensation intact.  Gait not checked.  PSYCHIATRIC: The patient is alert and oriented x 3.  SKIN: No rash, lesion, or ulcer.   LABORATORY PANEL:   CBC  Recent Labs Lab 12/23/15 1751  WBC 6.7  HGB 11.2*  HCT 34.5*  PLT 246   ------------------------------------------------------------------------------------------------------------------  Chemistries   Recent Labs Lab 12/23/15 1751  NA 138  K 3.8  CL 112*  CO2 18*  GLUCOSE 92  BUN 50*  CREATININE 2.06*  CALCIUM 9.4  AST 23  ALT 13*  ALKPHOS 76  BILITOT 1.0   ------------------------------------------------------------------------------------------------------------------   IMPRESSION AND PLAN:   1. Acute kidney injury and hypotension. Give IV fluid hydration. Likely this is ATN from hypotension. Hold Norvasc, losartan, metoprolol HCT, indoor and meloxicam. Monitor blood pressure off medications. Continue to monitor creatinine on a daily basis. Renal ultrasound. 2. Diarrhea. If any further diarrhea will send off to lab. Try to hold off on antibiotics at this time. 3. History of coronary artery disease. Continue aspirin and Plavix. 4. Glaucoma continue  eyedrops 5. Hyperlipidemia unspecified. Continue Crestor and Zetia 6. Weight loss. Can consider a CT scan of the abdomen and pelvis with contrast as outpatient once kidney function improves. Family states that he set up for an endoscopy and colonoscopy at the end of the month.   All the records are reviewed and case discussed with ED provider. Management plans discussed with the patient, family and they are in agreement.  CODE STATUS: Full code  TOTAL TIME TAKING CARE OF THIS PATIENT: 50 minutes.    Loletha Grayer M.D on 12/23/2015 at 10:29 PM  Between 7am to 6pm - Pager - [phone]  After 6pm call admission pager [phone]  [employer] Office  [phone]  CC: Primary care physician; Walker Kehr, MD

## 2015-12-23 NOTE — Telephone Encounter (Signed)
Pls sch post-ER f/u w/me Thx

## 2015-12-23 NOTE — ED Triage Notes (Addendum)
Pt arrives to ER via POV with family for flank pain, left side greater than right. Pt dx with acute kidney failure today, sent to ER.

## 2015-12-24 ENCOUNTER — Inpatient Hospital Stay: Payer: Medicare Other

## 2015-12-24 LAB — GASTROINTESTINAL PANEL BY PCR, STOOL (REPLACES STOOL CULTURE)
Adenovirus F40/41: NOT DETECTED
Astrovirus: NOT DETECTED
CAMPYLOBACTER SPECIES: NOT DETECTED
CRYPTOSPORIDIUM: NOT DETECTED
CYCLOSPORA CAYETANENSIS: NOT DETECTED
ENTEROTOXIGENIC E COLI (ETEC): DETECTED — AB
Entamoeba histolytica: NOT DETECTED
Enteroaggregative E coli (EAEC): NOT DETECTED
Enteropathogenic E coli (EPEC): NOT DETECTED
Giardia lamblia: NOT DETECTED
Norovirus GI/GII: NOT DETECTED
PLESIMONAS SHIGELLOIDES: NOT DETECTED
ROTAVIRUS A: NOT DETECTED
SAPOVIRUS (I, II, IV, AND V): NOT DETECTED
SHIGA LIKE TOXIN PRODUCING E COLI (STEC): NOT DETECTED
Salmonella species: NOT DETECTED
Shigella/Enteroinvasive E coli (EIEC): NOT DETECTED
VIBRIO SPECIES: NOT DETECTED
Vibrio cholerae: NOT DETECTED
YERSINIA ENTEROCOLITICA: NOT DETECTED

## 2015-12-24 LAB — CBC
HEMATOCRIT: 29.6 % — AB (ref 40.0–52.0)
HEMOGLOBIN: 9.8 g/dL — AB (ref 13.0–18.0)
MCH: 25.8 pg — AB (ref 26.0–34.0)
MCHC: 33.1 g/dL (ref 32.0–36.0)
MCV: 78 fL — AB (ref 80.0–100.0)
Platelets: 173 10*3/uL (ref 150–440)
RBC: 3.79 MIL/uL — AB (ref 4.40–5.90)
RDW: 16.3 % — ABNORMAL HIGH (ref 11.5–14.5)
WBC: 5.2 10*3/uL (ref 3.8–10.6)

## 2015-12-24 LAB — C DIFFICILE QUICK SCREEN W PCR REFLEX
C DIFFICILE (CDIFF) INTERP: NOT DETECTED
C Diff antigen: NEGATIVE
C Diff toxin: NEGATIVE

## 2015-12-24 LAB — GLUCOSE, CAPILLARY: Glucose-Capillary: 98 mg/dL (ref 65–99)

## 2015-12-24 LAB — BASIC METABOLIC PANEL
ANION GAP: 8 (ref 5–15)
BUN: 39 mg/dL — ABNORMAL HIGH (ref 6–20)
CHLORIDE: 115 mmol/L — AB (ref 101–111)
CO2: 18 mmol/L — AB (ref 22–32)
Calcium: 8.7 mg/dL — ABNORMAL LOW (ref 8.9–10.3)
Creatinine, Ser: 1.42 mg/dL — ABNORMAL HIGH (ref 0.61–1.24)
GFR calc non Af Amer: 45 mL/min — ABNORMAL LOW (ref >60)
GFR, EST AFRICAN AMERICAN: 52 mL/min — AB (ref >60)
Glucose, Bld: 85 mg/dL (ref 65–99)
POTASSIUM: 3.7 mmol/L (ref 3.5–5.1)
Sodium: 141 mmol/L (ref 135–145)

## 2015-12-24 LAB — RETICULIN ANTIBODIES, IGA W TITER: Reticulin Ab, IgA: NEGATIVE

## 2015-12-24 MED ORDER — ENOXAPARIN SODIUM 40 MG/0.4ML ~~LOC~~ SOLN
40.0000 mg | SUBCUTANEOUS | Status: DC
  Administered 2015-12-24: 40 mg via SUBCUTANEOUS
  Filled 2015-12-24: qty 0.4

## 2015-12-24 MED ORDER — CIPROFLOXACIN HCL 500 MG PO TABS
500.0000 mg | ORAL_TABLET | Freq: Two times a day (BID) | ORAL | Status: DC
  Administered 2015-12-24 – 2015-12-25 (×3): 500 mg via ORAL
  Filled 2015-12-24 (×3): qty 1

## 2015-12-24 NOTE — Progress Notes (Signed)
Physical Therapy Evaluation Patient Details Name: [name] [name] MRN: [id number] DOB: 1932-07-06 Today's Date: 12/24/2015   History of Present Illness  [name] [name]  is a 82 y.o. male. Patient told to come to the hospital by his medical doctor for acute kidney injury. Patient states that he's been having diarrhea for 1 week. No blood in the bowel movements. No recent antibiotics. No travel out of the country. He is also having back pain on and off. Family states that his blood pressure is been low and he's been off balance and he has had poor appetite. He's lost a tremendous amount of weight recently. He is also felt woozy.  Clinical Impression  Patient needs SBA for transfers and ambulation with RW 200 feet. He is independent with bed mobility and reports minimal back pain . He has good static standing balance and fair dynamic standing balance and needs UE support during ambulation. He will benefit from HHPT at [place].     Follow Up Recommendations Home health PT    Equipment Recommendations  Rolling walker with 5" wheels    Recommendations for Other Services       Precautions / Restrictions Restrictions Weight Bearing Restrictions: No      Mobility  Bed Mobility Overal bed mobility: Modified Independent             General bed mobility comments:  (SBA)  Transfers Overall transfer level: Needs assistance Equipment used: Rolling walker (2 wheeled) Transfers: Sit to/from Stand Sit to Stand: Supervision         General transfer comment:  (SBA with RW)  Ambulation/Gait Ambulation/Gait assistance: Supervision Ambulation Distance (Feet): 200 Feet Assistive device: Rolling walker (2 wheeled) Gait Pattern/deviations: Step-through pattern   Gait velocity interpretation: <1.8 ft/sec, indicative of risk for recurrent falls General Gait Details:  (SBA with RW)  Stairs            Wheelchair Mobility    Modified Rankin (Stroke Patients Only)       Balance Overall  balance assessment: Modified Independent                                           Pertinent Vitals/Pain Pain Assessment: 0-10 Pain Score: 1  Pain Location:  (back) Pain Descriptors / Indicators: Aching Pain Intervention(s): Limited activity within patient's tolerance    Home Living Family/patient expects to be discharged to:: Private residence Living Arrangements: Children Available Help at Discharge: Family Type of Home: House Home Access: Stairs to enter Entrance Stairs-Rails: Right Entrance Stairs-Number of Steps: 3   Home Equipment: Environmental consultant - 2 wheels      Prior Function Level of Independence: Independent with assistive device(s)               Hand Dominance        Extremity/Trunk Assessment               Lower Extremity Assessment: Overall WFL for tasks assessed         Communication   Communication: No difficulties  Cognition Arousal/Alertness: Awake/alert Behavior During Therapy: WFL for tasks assessed/performed Overall Cognitive Status: Within Functional Limits for tasks assessed                      General Comments      Exercises        Assessment/Plan    PT Assessment  Patient needs continued PT services  PT Diagnosis Difficulty walking   PT Problem List Decreased mobility;Pain  PT Treatment Interventions     PT Goals (Current goals can be found in the Care Plan section) Acute Rehab PT Goals Patient Stated Goal: to go hoome PT Goal Formulation: With patient Time For Goal Achievement: 01/06/16 Potential to Achieve Goals: Good    Frequency Min 2X/week   Barriers to discharge        Co-evaluation               End of Session Equipment Utilized During Treatment: Gait belt Activity Tolerance: Patient tolerated treatment well Patient left: with bed alarm set;in bed           Time: 0930-1000 PT Time Calculation (min) (ACUTE ONLY): 30 min   Charges:   PT Evaluation $PT Eval Low  Complexity: 1 Procedure PT Treatments $Gait Training: 8-22 mins   PT G Codes:      Alanson Puls, PT, DPT  Glandorf, Minette Headland S 12/24/2015, 11:23 AM

## 2015-12-24 NOTE — Progress Notes (Signed)
82 y/o M ordered Lovenox 30 mg daily for DVT prophylaxis. Due to improved SCr, will increase dosing of Lovenox to 40 mg daily.   Ulice Dash, PharmD Clinical Pharmacist

## 2015-12-24 NOTE — Progress Notes (Signed)
[facility] [place] NAME: [name] [name]    MR#:  [id number]  DATE OF BIRTH:  July 22, 1932  SUBJECTIVE:  CHIEF COMPLAINT:   Chief Complaint  Patient presents with  . Back Pain   -Admitted with diarrhea and abdominal pain and weakness. -Renal function improving with IV fluids. Diarrhea has resolved at this time.  REVIEW OF SYSTEMS:  Review of Systems  Constitutional: Positive for malaise/fatigue and weight loss. Negative for chills and fever.  HENT: Negative for ear discharge, ear pain, nosebleeds and tinnitus.   Eyes: Negative for blurred vision and double vision.  Respiratory: Negative for cough, shortness of breath and wheezing.   Cardiovascular: Negative for chest pain, palpitations and leg swelling.  Gastrointestinal: Positive for abdominal pain and diarrhea. Negative for constipation, nausea and vomiting.  Genitourinary: Negative for dysuria and urgency.  Musculoskeletal: Negative for back pain, myalgias and neck pain.  Neurological: Negative for dizziness, sensory change, speech change, focal weakness, seizures and headaches.  Psychiatric/Behavioral: Negative for depression.    DRUG ALLERGIES:   Allergies  Allergen Reactions  . Benazepril Hcl Cough  . Cosopt [Dorzolamide Hcl-Timolol Mal] Other (See Comments)    Eyes water  . Ezetimibe-Simvastatin Other (See Comments)    REACTION: aches  . Lipitor [Atorvastatin] Other (See Comments)    Muscle aches    VITALS:  Blood pressure (!) 131/50, pulse 63, temperature 97.7 F (36.5 C), temperature source Oral, resp. rate 20, height 5\' 7"  (1.702 m), weight 64.9 kg (143 lb), SpO2 100 %.  PHYSICAL EXAMINATION:  Physical Exam  GENERAL:  82 y.o.-year-old patient lying in the bed with no acute distress.  EYES: Pupils equal, round, reactive to light and accommodation. No scleral icterus. Extraocular muscles intact.  HEENT: Head atraumatic, normocephalic. Oropharynx and nasopharynx  clear.  NECK:  Supple, no jugular venous distention. No thyroid enlargement, no tenderness.  LUNGS: Normal breath sounds bilaterally, no wheezing, rales,rhonchi or crepitation. No use of accessory muscles of respiration.  CARDIOVASCULAR: S1, S2 normal. No  rubs, or gallops. Loud 3/6 systolic murmur all over the precordial area ABDOMEN: Soft, nontender, nondistended. Bowel sounds present. No organomegaly or mass.  EXTREMITIES: No pedal edema, cyanosis, or clubbing.  NEUROLOGIC: Cranial nerves II through XII are intact. Muscle strength 5/5 in all extremities. Sensation intact. Gait not checked.  PSYCHIATRIC: The patient is alert and oriented x 3.  SKIN: No obvious rash, lesion, or ulcer.    LABORATORY PANEL:   CBC  Recent Labs Lab 12/24/15 0538  WBC 5.2  HGB 9.8*  HCT 29.6*  PLT 173   ------------------------------------------------------------------------------------------------------------------  Chemistries   Recent Labs Lab 12/23/15 1751 12/24/15 0538  NA 138 141  K 3.8 3.7  CL 112* 115*  CO2 18* 18*  GLUCOSE 92 85  BUN 50* 39*  CREATININE 2.06* 1.42*  CALCIUM 9.4 8.7*  AST 23  --   ALT 13*  --   ALKPHOS 76  --   BILITOT 1.0  --    ------------------------------------------------------------------------------------------------------------------  Cardiac Enzymes No results for input(s): TROPONINI in the last 168 hours. ------------------------------------------------------------------------------------------------------------------  RADIOLOGY:  No results found.  EKG:   Orders placed or performed in visit on 12/16/13  . EKG 12-Lead    ASSESSMENT AND PLAN:   82 year old male with past medical history significant for arthritis, coronary artery disease, degenerative disc disease, GERD, hypertension, PVD presents to hospital secondary to weakness and noted to be in renal failure \ #1 acute renal failure-secondary to  GI losses and prerenal. -Continue IV  fluids. Creatinine is improving. Creatinine at 1.4 today. Baseline creatinine seems to be around 1.2  #2 diarrhea-stool studies with enterotoxigenic Escherichia coli infection -C. difficile is negative. Started on ciprofloxacin. Symptoms have improved. No recent travel.  #3 CAD-continue  Plavix and statin. Stable  #4 weight loss-recent weight loss of greater than 20 pounds as outpatient. Being followed by PCP and GI for the same. Has endoscopy and colonoscopy scheduled in a few weeks  #5 DVT prophylaxis-on Lovenox  Appreciate physical therapy consult. Possible discharge tomorrow with home health    All the records are reviewed and case discussed with Care Management/Social Workerr. Management plans discussed with the patient, family and they are in agreement.  CODE STATUS: Full code  TOTAL TIME TAKING CARE OF THIS PATIENT: 37 minutes.   POSSIBLE D/C IN 1-2 DAYS, DEPENDING ON CLINICAL CONDITION.   Gladstone Lighter M.D on 12/24/2015 at 12:48 PM  Between 7am to 6pm - Pager - [phone]  After 6pm go to [email or web address] - password [email or web address] [facility]  Office  [phone]  CC: Primary care physician; Walker Kehr, MD

## 2015-12-25 LAB — BASIC METABOLIC PANEL
ANION GAP: 4 — AB (ref 5–15)
BUN: 23 mg/dL — AB (ref 6–20)
CHLORIDE: 117 mmol/L — AB (ref 101–111)
CO2: 20 mmol/L — AB (ref 22–32)
Calcium: 8.7 mg/dL — ABNORMAL LOW (ref 8.9–10.3)
Creatinine, Ser: 0.9 mg/dL (ref 0.61–1.24)
GFR calc Af Amer: >60 mL/min (ref >60)
GLUCOSE: 85 mg/dL (ref 65–99)
POTASSIUM: 3.9 mmol/L (ref 3.5–5.1)
Sodium: 141 mmol/L (ref 135–145)

## 2015-12-25 MED ORDER — CIPROFLOXACIN HCL 500 MG PO TABS: 500.0000 mg | ORAL_TABLET | Freq: Two times a day (BID) | ORAL | 0 refills | Status: DC

## 2015-12-25 NOTE — Care Management Note (Signed)
Case Management Note  Patient Details  Name: [name] [name] MRN: [id number] Date of Birth: 1933/01/24  Subjective/Objective:  Discussed discharge planning with [name] [name] and his daughter [name] [name] is taking [name] [name] home to stay with her for [name] few weeks at [address], [place], [place], [place] [address]. Ph: [phone]. They chose [facility] to be [name] [name]'s home health provider for PT and RN services. [name] referral was called to [name] [name] at [facility] and [name] [name] new temporary address was given to [name] [name].                   Action/Plan:   Expected Discharge Date:                  Expected Discharge Plan:     In-House Referral:     Discharge planning Services     Post Acute Care Choice:    Choice offered to:     DME Arranged:    DME Agency:     HH Arranged:    HH Agency:     Status of Service:     If discussed at [employer] of Stay Meetings, dates discussed:    Additional Comments:  [name] Sullenberger A, RN 12/25/2015, 12:47 PM

## 2015-12-26 ENCOUNTER — Telehealth: Payer: Self-pay | Admitting: *Deleted

## 2015-12-26 ENCOUNTER — Other Ambulatory Visit: Payer: Self-pay | Admitting: Internal Medicine

## 2015-12-26 DIAGNOSIS — R778 Other specified abnormalities of plasma proteins: Secondary | ICD-10-CM

## 2015-12-26 LAB — PROTEIN ELECTROPHORESIS, SERUM, WITH REFLEX
ALBUMIN ELP: 4.1 g/dL (ref 3.8–4.8)
ALPHA-1-GLOBULIN: 0.3 g/dL (ref 0.2–0.3)
ALPHA-2-GLOBULIN: 1.1 g/dL — AB (ref 0.5–0.9)
BETA 2: 0.3 g/dL (ref 0.2–0.5)
BETA GLOBULIN: 0.5 g/dL (ref 0.4–0.6)
GAMMA GLOBULIN: 0.7 g/dL — AB (ref 0.8–1.7)
Total Protein, Serum Electrophoresis: 6.9 g/dL (ref 6.1–8.1)

## 2015-12-26 LAB — IFE INTERPRETATION

## 2015-12-26 NOTE — Telephone Encounter (Signed)
Transition Care Management Follow-up Telephone Call   Date discharged? 12/25/15   How have you been since you were released from the hospital? Spoke w/pt he states he is feeling ok   Do you understand why you were in the hospital? YES   Do you understand the discharge instructions? YES, inform pt to bring discharge summary when he come in for appt   Where were you discharged to? Home   Items Reviewed:  Medications reviewed: YES  Allergies reviewed: YES  Dietary changes reviewed: NO  Referrals reviewed: No referral needed   Functional Questionnaire:   Activities of Daily Living (ADLs):   He states he are independent in the following: ambulation, bathing and hygiene, feeding, continence, grooming, toileting and dressing States he doesn't require assistance     Any transportation issues/concerns?: NO   Any patient concerns? NO   Confirmed importance and date/time of follow-up visits scheduled YES, pt had already made appt this am for 12/29/15  Provider Appointment booked with Dr. Alain Marion  Confirmed with patient if condition begins to worsen call PCP or go to the ER.  Patient was given the office number and encouraged to call back with question or concerns.  : YES

## 2015-12-26 NOTE — Discharge Summary (Signed)
[facility] [place] NAME: [name] [name]    MR#:  [id number]  DATE OF BIRTH:  04/18/1932  DATE OF ADMISSION:  12/23/2015   ADMITTING PHYSICIAN: Loletha Grayer, MD  DATE OF DISCHARGE: 12/25/2015  3:08 PM  PRIMARY CARE PHYSICIAN: Walker Kehr, MD   ADMISSION DIAGNOSIS:   Acute renal failure, unspecified acute renal failure type ([place]) [N17.9]  DISCHARGE DIAGNOSIS:   Active Problems:   Acute kidney injury ([place])   SECONDARY DIAGNOSIS:   Past Medical History:  Diagnosis Date  . Anxiety   . Arthritis   . Carotid artery disease ([place])   . Coronary artery disease    sees Dr. Verl Blalock  . Depression   . DJD (degenerative joint disease)    right hip  . GERD (gastroesophageal reflux disease)    hx of  . Glaucoma   . Glucose intolerance (impaired glucose tolerance)   . Headache(784.0)    hx of migraines  . Herpes zoster 2009   r. scalp and opth  . Hx of colonic polyps 2004   hyperplastic  . Hyperlipidemia   . Hypertension    sees Dr. Adelene Amas  . Internal and external hemorrhoids without complication   . Low back pain    facet arthropathy  . Lumbago   . Peripheral vascular disease ([place])   . Pneumonia 2009   rll with a small effusion  . Postherpetic neuralgia 2009    HOSPITAL COURSE:   82 year old male with past medical history significant for arthritis, coronary artery disease, degenerative disc disease, GERD, hypertension, PVD presents to hospital secondary to weakness and noted to be in renal failure \ #1 acute renal failure-secondary to GI losses and prerenal. -Improved with IV fluids. Creatinine is improved. Baseline creatinine seems to be around 1.2  #2 diarrhea-stool studies with enterotoxigenic Escherichia coli infection -C. difficile is negative. on ciprofloxacin. Symptoms have improved. No recent travel.  #3 CAD-continue  Plavix and statin. Stable  #4 weight loss-recent weight loss of greater than 20 pounds  as outpatient. Being followed by PCP and GI for the same. Has endoscopy and colonoscopy scheduled in a few weeks  Stable for discharge. Home health will be set up.   DISCHARGE CONDITIONS:   Stable  CONSULTS OBTAINED:   None  DRUG ALLERGIES:   Allergies  Allergen Reactions  . Benazepril Hcl Cough  . Cosopt [Dorzolamide Hcl-Timolol Mal] Other (See Comments)    Eyes water  . Ezetimibe-Simvastatin Other (See Comments)    REACTION: aches  . Lipitor [Atorvastatin] Other (See Comments)    Muscle aches   DISCHARGE MEDICATIONS:     Medication List    STOP taking these medications   isosorbide mononitrate 60 MG 24 hr tablet Commonly known as:  IMDUR   losartan 100 MG tablet Commonly known as:  COZAAR   meloxicam 7.5 MG tablet Commonly known as:  MOBIC   metoprolol-hydrochlorothiazide 100-25 MG tablet Commonly known as:  LOPRESSOR HCT     TAKE these medications   amLODipine 5 MG tablet Commonly known as:  NORVASC TAKE 1 TABLET DAILY   ciprofloxacin 500 MG tablet Commonly known as:  CIPRO Take 1 tablet (500 mg total) by mouth 2 (two) times daily. X 2 more days   clopidogrel 75 MG tablet Commonly known as:  PLAVIX Take 1 tablet (75 mg total) by mouth daily.   cycloSPORINE 0.05 % ophthalmic emulsion Commonly known as:  RESTASIS Place 1 drop into both eyes 2 (two)  times daily.   ezetimibe 10 MG tablet Commonly known as:  ZETIA Take 1 tablet (10 mg total) by mouth daily.   nitroGLYCERIN 0.4 MG SL tablet Commonly known as:  NITROSTAT Place 1 tablet (0.4 mg total) under the tongue every 5 (five) minutes as needed. For chest pain   rosuvastatin 20 MG tablet Commonly known as:  CRESTOR TAKE 1 TABLET DAILY   TRAVATAN Z 0.004 % Soln ophthalmic solution Generic drug:  Travoprost (BAK Free) Place 1 drop into both eyes daily.   Vitamin D3 1000 units Caps Take 1 capsule by mouth daily.        DISCHARGE INSTRUCTIONS:   1. PCP f/u in 1 week for further weight  loss work up 2. Scheduled for EGD and colonoscopy as outpatient  DIET:   Cardiac diet  ACTIVITY:   Activity as tolerated  OXYGEN:   Home Oxygen: No.  Oxygen Delivery: room air  DISCHARGE LOCATION:   home   If you experience worsening of your admission symptoms, develop shortness of breath, life threatening emergency, suicidal or homicidal thoughts you must seek medical attention immediately by calling 911 or calling your MD immediately  if symptoms less severe.  You Must read complete instructions/literature along with all the possible adverse reactions/side effects for all the Medicines you take and that have been prescribed to you. Take any new Medicines after you have completely understood and accpet all the possible adverse reactions/side effects.   Please note  You were cared for by a hospitalist during your hospital stay. If you have any questions about your discharge medications or the care you received while you were in the hospital after you are discharged, you can call the unit and asked to speak with the hospitalist on call if the hospitalist that took care of you is not available. Once you are discharged, your primary care physician will handle any further medical issues. Please note that NO REFILLS for any discharge medications will be authorized once you are discharged, as it is imperative that you return to your primary care physician (or establish a relationship with a primary care physician if you do not have one) for your aftercare needs so that they can reassess your need for medications and monitor your lab values.    On the day of Discharge:  VITAL SIGNS:   Blood pressure (!) 125/58, pulse 63, temperature 98.3 F (36.8 C), temperature source Oral, resp. rate 17, height 5\' 7"  (1.702 m), weight 64.1 kg (141 lb 4.8 oz), SpO2 100 %.  PHYSICAL EXAMINATION:    GENERAL:  82 y.o.-year-old patient lying in the bed with no acute distress.  EYES: Pupils equal, round,  reactive to light and accommodation. No scleral icterus. Extraocular muscles intact.  HEENT: Head atraumatic, normocephalic. Oropharynx and nasopharynx clear.  NECK:  Supple, no jugular venous distention. No thyroid enlargement, no tenderness.  LUNGS: Normal breath sounds bilaterally, no wheezing, rales,rhonchi or crepitation. No use of accessory muscles of respiration.  CARDIOVASCULAR: S1, S2 normal. No  rubs, or gallops. Loud 3/6 systolic murmur all over the precordial area ABDOMEN: Soft, nontender, nondistended. Bowel sounds present. No organomegaly or mass.  EXTREMITIES: No pedal edema, cyanosis, or clubbing.  NEUROLOGIC: Cranial nerves II through XII are intact. Muscle strength 5/5 in all extremities. Sensation intact. Gait not checked.  PSYCHIATRIC: The patient is alert and oriented x 3.  SKIN: No obvious rash, lesion, or ulcer.    DATA REVIEW:   CBC  Recent Labs Lab 12/24/15 [phone]  WBC 5.2  HGB 9.8*  HCT 29.6*  PLT 173    Chemistries   Recent Labs Lab 12/23/15 1751  12/25/15 0622  NA 138  < > 141  K 3.8  < > 3.9  CL 112*  < > 117*  CO2 18*  < > 20*  GLUCOSE 92  < > 85  BUN 50*  < > 23*  CREATININE 2.06*  < > 0.90  CALCIUM 9.4  < > 8.7*  AST 23  --   --   ALT 13*  --   --   ALKPHOS 76  --   --   BILITOT 1.0  --   --   < > = values in this interval not displayed.   Microbiology Results  Results for orders placed or performed during the hospital encounter of 12/23/15  C difficile quick scan w PCR reflex     Status: None   Collection Time: 12/24/15  6:02 AM  Result Value Ref Range Status   C Diff antigen NEGATIVE NEGATIVE Final   C Diff toxin NEGATIVE NEGATIVE Final   C Diff interpretation No C. difficile detected.  Final  Gastrointestinal Panel by PCR , Stool     Status: Abnormal   Collection Time: 12/24/15  6:02 AM  Result Value Ref Range Status   Campylobacter species NOT DETECTED NOT DETECTED Final   Plesimonas shigelloides NOT DETECTED NOT DETECTED  Final   Salmonella species NOT DETECTED NOT DETECTED Final   Yersinia enterocolitica NOT DETECTED NOT DETECTED Final   Vibrio species NOT DETECTED NOT DETECTED Final   Vibrio cholerae NOT DETECTED NOT DETECTED Final   Enteroaggregative E coli (EAEC) NOT DETECTED NOT DETECTED Final   Enteropathogenic E coli (EPEC) NOT DETECTED NOT DETECTED Final   Enterotoxigenic E coli (ETEC) DETECTED (A) NOT DETECTED Final    Comment: CRITICAL RESULT CALLED TO, READ BACK BY AND VERIFIED WITH: Haynes Hoehn @ [phone] 12/24/15 by [facility]    Shiga like toxin producing E coli (STEC) NOT DETECTED NOT DETECTED Final   Shigella/Enteroinvasive E coli (EIEC) NOT DETECTED NOT DETECTED Final   Cryptosporidium NOT DETECTED NOT DETECTED Final   Cyclospora cayetanensis NOT DETECTED NOT DETECTED Final   Entamoeba histolytica NOT DETECTED NOT DETECTED Final   Giardia lamblia NOT DETECTED NOT DETECTED Final   Adenovirus F40/41 NOT DETECTED NOT DETECTED Final   Astrovirus NOT DETECTED NOT DETECTED Final   Norovirus GI/GII NOT DETECTED NOT DETECTED Final   Rotavirus A NOT DETECTED NOT DETECTED Final   Sapovirus (I, II, IV, and V) NOT DETECTED NOT DETECTED Final    RADIOLOGY:  No results found.   Management plans discussed with the patient, family and they are in agreement.  CODE STATUS:  Code Status History    Date Active Date Inactive Code Status Order ID Comments User Context   12/23/2015 10:25 PM 12/25/2015  6:13 PM Full Code [id number]  Loletha Grayer, MD ED      TOTAL TIME TAKING CARE OF THIS PATIENT: 37 minutes.    Gladstone Lighter M.D on 12/26/2015 at 6:18 PM  Between 7am to 6pm - Pager - [phone]  After 6pm go to [email or web address] - [employer] [facility]  Office  [phone]  CC: Primary care physician; Walker Kehr, MD   Note: This dictation was prepared with Dragon dictation along with smaller phrase technology. Any transcriptional errors that result from this  process are unintentional.

## 2015-12-26 NOTE — Telephone Encounter (Signed)
Tammy informed to call pt and schedule ER f/u.

## 2015-12-27 DIAGNOSIS — F419 Anxiety disorder, unspecified: Secondary | ICD-10-CM | POA: Diagnosis not present

## 2015-12-27 DIAGNOSIS — M6281 Muscle weakness (generalized): Secondary | ICD-10-CM | POA: Diagnosis not present

## 2015-12-27 DIAGNOSIS — I2510 Atherosclerotic heart disease of native coronary artery without angina pectoris: Secondary | ICD-10-CM | POA: Diagnosis not present

## 2015-12-27 DIAGNOSIS — I739 Peripheral vascular disease, unspecified: Secondary | ICD-10-CM | POA: Diagnosis not present

## 2015-12-27 DIAGNOSIS — I10 Essential (primary) hypertension: Secondary | ICD-10-CM | POA: Diagnosis not present

## 2015-12-27 DIAGNOSIS — Z87891 Personal history of nicotine dependence: Secondary | ICD-10-CM | POA: Diagnosis not present

## 2015-12-27 DIAGNOSIS — Z9181 History of falling: Secondary | ICD-10-CM | POA: Diagnosis not present

## 2015-12-27 DIAGNOSIS — M4727 Other spondylosis with radiculopathy, lumbosacral region: Secondary | ICD-10-CM | POA: Diagnosis not present

## 2015-12-27 DIAGNOSIS — F329 Major depressive disorder, single episode, unspecified: Secondary | ICD-10-CM | POA: Diagnosis not present

## 2015-12-27 DIAGNOSIS — Z7902 Long term (current) use of antithrombotics/antiplatelets: Secondary | ICD-10-CM | POA: Diagnosis not present

## 2015-12-27 NOTE — Telephone Encounter (Signed)
Left message on daughters voicemail

## 2015-12-28 DIAGNOSIS — I2510 Atherosclerotic heart disease of native coronary artery without angina pectoris: Secondary | ICD-10-CM | POA: Diagnosis not present

## 2015-12-28 DIAGNOSIS — M4727 Other spondylosis with radiculopathy, lumbosacral region: Secondary | ICD-10-CM | POA: Diagnosis not present

## 2015-12-28 DIAGNOSIS — F419 Anxiety disorder, unspecified: Secondary | ICD-10-CM | POA: Diagnosis not present

## 2015-12-28 DIAGNOSIS — M6281 Muscle weakness (generalized): Secondary | ICD-10-CM | POA: Diagnosis not present

## 2015-12-28 DIAGNOSIS — I10 Essential (primary) hypertension: Secondary | ICD-10-CM | POA: Diagnosis not present

## 2015-12-28 DIAGNOSIS — I739 Peripheral vascular disease, unspecified: Secondary | ICD-10-CM | POA: Diagnosis not present

## 2015-12-29 ENCOUNTER — Encounter: Payer: Self-pay | Admitting: Internal Medicine

## 2015-12-29 ENCOUNTER — Ambulatory Visit (INDEPENDENT_AMBULATORY_CARE_PROVIDER_SITE_OTHER): Payer: Medicare Other | Admitting: Internal Medicine

## 2015-12-29 ENCOUNTER — Other Ambulatory Visit: Payer: Medicare Other

## 2015-12-29 DIAGNOSIS — R197 Diarrhea, unspecified: Secondary | ICD-10-CM

## 2015-12-29 DIAGNOSIS — A09 Infectious gastroenteritis and colitis, unspecified: Secondary | ICD-10-CM

## 2015-12-29 DIAGNOSIS — J309 Allergic rhinitis, unspecified: Secondary | ICD-10-CM | POA: Diagnosis not present

## 2015-12-29 DIAGNOSIS — I2510 Atherosclerotic heart disease of native coronary artery without angina pectoris: Secondary | ICD-10-CM

## 2015-12-29 DIAGNOSIS — R634 Abnormal weight loss: Secondary | ICD-10-CM

## 2015-12-29 DIAGNOSIS — D649 Anemia, unspecified: Secondary | ICD-10-CM

## 2015-12-29 DIAGNOSIS — Z5189 Encounter for other specified aftercare: Secondary | ICD-10-CM

## 2015-12-29 DIAGNOSIS — N179 Acute kidney failure, unspecified: Secondary | ICD-10-CM

## 2015-12-29 DIAGNOSIS — R6881 Early satiety: Secondary | ICD-10-CM

## 2015-12-29 NOTE — Assessment & Plan Note (Signed)
Monitoring wt

## 2015-12-29 NOTE — Assessment & Plan Note (Signed)
9/17 Enterotoxic E coli (+) diarrhea w/ARF Labs On Norvasc

## 2015-12-29 NOTE — Assessment & Plan Note (Signed)
Crestor, Zetia, ASA, Plavix 

## 2015-12-29 NOTE — Assessment & Plan Note (Signed)
9/17 Enterotoxic E coli (+) diarrhea w/ARF

## 2015-12-29 NOTE — Progress Notes (Signed)
Agree w/ Ms. Lemmon's magament

## 2015-12-29 NOTE — Progress Notes (Signed)
Pre visit review using our clinic review tool, if applicable. No additional management support is needed unless otherwise documented below in the visit note. 

## 2015-12-29 NOTE — Progress Notes (Signed)
Subjective:  Patient ID: [name] [name], male    DOB: 24-Jan-1933  Age: 82 y.o. MRN: [id number]  CC: No chief complaint on file.   HPI AKSHATH SAPIO presents for ARF and Enterotoxic E coli (+) diarrhea - recent admission reviewed. He was d/c'd on 9/10. F/u wt loss  Outpatient Medications Prior to Visit  Medication Sig Dispense Refill  . amLODipine (NORVASC) 5 MG tablet TAKE 1 TABLET DAILY 90 tablet 3  . Cholecalciferol (VITAMIN D3) 1000 UNITS CAPS Take 1 capsule by mouth daily.      . clopidogrel (PLAVIX) 75 MG tablet Take 1 tablet (75 mg total) by mouth daily. 90 tablet 3  . cycloSPORINE (RESTASIS) 0.05 % ophthalmic emulsion Place 1 drop into both eyes 2 (two) times daily.    Marland Kitchen ezetimibe (ZETIA) 10 MG tablet Take 1 tablet (10 mg total) by mouth daily. 90 tablet 3  . nitroGLYCERIN (NITROSTAT) 0.4 MG SL tablet Place 1 tablet (0.4 mg total) under the tongue every 5 (five) minutes as needed. For chest pain 20 tablet 3  . rosuvastatin (CRESTOR) 20 MG tablet TAKE 1 TABLET DAILY 90 tablet 3  . TRAVATAN Z 0.004 % SOLN ophthalmic solution Place 1 drop into both eyes daily.    . ciprofloxacin (CIPRO) 500 MG tablet Take 1 tablet (500 mg total) by mouth 2 (two) times daily. X 2 more days (Patient not taking: Reported on 12/29/2015) 4 tablet 0   No facility-administered medications prior to visit.     ROS Review of Systems  Constitutional: Negative for appetite change, fatigue and unexpected weight change.  HENT: Negative for congestion, nosebleeds, sneezing, sore throat and trouble swallowing.   Eyes: Negative for itching and visual disturbance.  Respiratory: Negative for cough.   Cardiovascular: Negative for chest pain, palpitations and leg swelling.  Gastrointestinal: Negative for abdominal distention, blood in stool, diarrhea and nausea.  Genitourinary: Negative for frequency and hematuria.  Musculoskeletal: Negative for back pain, gait problem, joint swelling and neck pain.  Skin: Negative  for rash.  Neurological: Negative for dizziness, tremors, speech difficulty and weakness.  Psychiatric/Behavioral: Negative for agitation, dysphoric mood and sleep disturbance. The patient is not nervous/anxious.     Objective:  BP 140/78   Pulse 78   Wt 150 lb (68 kg)   SpO2 98%   BMI 23.49 kg/m   BP Readings from Last 3 Encounters:  12/29/15 140/78  12/25/15 (!) 125/58  12/22/15 (!) 80/30    Wt Readings from Last 3 Encounters:  12/29/15 150 lb (68 kg)  12/25/15 141 lb 4.8 oz (64.1 kg)  12/22/15 143 lb 12.8 oz (65.2 kg)    Physical Exam  Constitutional: He is oriented to person, place, and time. He appears well-developed. No distress.  NAD  HENT:  Mouth/Throat: Oropharynx is clear and moist.  Eyes: Conjunctivae are normal. Pupils are equal, round, and reactive to light.  Neck: Normal range of motion. No JVD present. No thyromegaly present.  Cardiovascular: Normal rate, regular rhythm, normal heart sounds and intact distal pulses.  Exam reveals no gallop and no friction rub.   No murmur heard. Pulmonary/Chest: Effort normal and breath sounds normal. No respiratory distress. He has no wheezes. He has no rales. He exhibits no tenderness.  Abdominal: Soft. Bowel sounds are normal. He exhibits no distension and no mass. There is no tenderness. There is no rebound and no guarding.  Musculoskeletal: Normal range of motion. He exhibits no edema or tenderness.  Lymphadenopathy:  He has no cervical adenopathy.  Neurological: He is alert and oriented to person, place, and time. He has normal reflexes. No cranial nerve deficit. He exhibits normal muscle tone. He displays a negative Romberg sign. Coordination and gait normal.  Skin: Skin is warm and dry. No rash noted.  Psychiatric: He has a normal mood and affect. His behavior is normal. Judgment and thought content normal.  Thin  Lab Results  Component Value Date   WBC 5.2 12/24/2015   HGB 9.8 (L) 12/24/2015   HCT 29.6 (L)  12/24/2015   PLT 173 12/24/2015   GLUCOSE 85 12/25/2015   CHOL 160 10/14/2014   TRIG 92.0 10/14/2014   HDL 52.70 10/14/2014   LDLDIRECT 136.2 09/28/2009   LDLCALC 89 10/14/2014   ALT 13 (L) 12/23/2015   AST 23 12/23/2015   NA 141 12/25/2015   K 3.9 12/25/2015   CL 117 (H) 12/25/2015   CREATININE 0.90 12/25/2015   BUN 23 (H) 12/25/2015   CO2 20 (L) 12/25/2015   TSH 1.34 12/22/2015   PSA 0.38 10/14/2014   INR 1.1 ratio (H) 10/06/2009   HGBA1C 5.6 12/09/2013    US Renal  Result Date: 12/24/2015 CLINICAL DATA:  Back pain, renal failure EXAM: RENAL / URINARY TRACT ULTRASOUND COMPLETE COMPARISON:  08/31/2015 FINDINGS: Right Kidney: Length: 11 cm. Normal echogenicity. No hydronephrosis. A cyst in upper pole measures 3.2 x 2.6 cm. A cyst in lower pole measures 1.5 x 1.2 cm. Left Kidney: Length: 11.5 cm. Normal echogenicity. No hydronephrosis. Cyst in lower pole measures 1.9 by 1.2 cm. Second left renal cyst measures 8 x 7 mm. Bladder: Appears normal for degree of bladder distention. Bilateral ureteral jets are visualized. IMPRESSION: 1. No hydronephrosis. Bilateral renal cysts. Unremarkable urinary bladder. Electronically Signed   By: Lahoma Crocker M.D.   On: 12/24/2015 12:53    Assessment & Plan:   There are no diagnoses linked to this encounter. I have discontinued Mr. Hawe's ciprofloxacin. I am also having him maintain his Vitamin D3, nitroGLYCERIN, ezetimibe, clopidogrel, amLODipine, TRAVATAN Z, rosuvastatin, and cycloSPORINE.  No orders of the defined types were placed in this encounter.    Follow-up: No Follow-up on file.  Walker Kehr, MD

## 2015-12-29 NOTE — Patient Instructions (Signed)
Enterotoxic E coli (+) diarrhea

## 2015-12-30 DIAGNOSIS — F419 Anxiety disorder, unspecified: Secondary | ICD-10-CM | POA: Diagnosis not present

## 2015-12-30 DIAGNOSIS — M6281 Muscle weakness (generalized): Secondary | ICD-10-CM | POA: Diagnosis not present

## 2015-12-30 DIAGNOSIS — I10 Essential (primary) hypertension: Secondary | ICD-10-CM | POA: Diagnosis not present

## 2015-12-30 DIAGNOSIS — M4727 Other spondylosis with radiculopathy, lumbosacral region: Secondary | ICD-10-CM | POA: Diagnosis not present

## 2015-12-30 DIAGNOSIS — I739 Peripheral vascular disease, unspecified: Secondary | ICD-10-CM | POA: Diagnosis not present

## 2015-12-30 DIAGNOSIS — I2510 Atherosclerotic heart disease of native coronary artery without angina pectoris: Secondary | ICD-10-CM | POA: Diagnosis not present

## 2015-12-30 LAB — GASTROINTESTINAL PATHOGEN PANEL PCR
C. difficile Tox A/B, PCR: NOT DETECTED
Campylobacter, PCR: NOT DETECTED
Cryptosporidium, PCR: NOT DETECTED
E COLI (ETEC) LT/ST, PCR: NOT DETECTED
E COLI 0157, PCR: NOT DETECTED
E coli (STEC) stx1/stx2, PCR: NOT DETECTED
Giardia lamblia, PCR: NOT DETECTED
Norovirus, PCR: NOT DETECTED
Rotavirus A, PCR: NOT DETECTED
SALMONELLA, PCR: NOT DETECTED
SHIGELLA, PCR: NOT DETECTED

## 2015-12-30 LAB — C. DIFFICILE GDH AND TOXIN A/B
C. difficile GDH: NOT DETECTED
C. difficile Toxin A/B: NOT DETECTED

## 2016-01-02 LAB — STOOL CULTURE

## 2016-01-03 DIAGNOSIS — M4727 Other spondylosis with radiculopathy, lumbosacral region: Secondary | ICD-10-CM | POA: Diagnosis not present

## 2016-01-03 DIAGNOSIS — M6281 Muscle weakness (generalized): Secondary | ICD-10-CM | POA: Diagnosis not present

## 2016-01-03 DIAGNOSIS — I2510 Atherosclerotic heart disease of native coronary artery without angina pectoris: Secondary | ICD-10-CM | POA: Diagnosis not present

## 2016-01-03 DIAGNOSIS — I739 Peripheral vascular disease, unspecified: Secondary | ICD-10-CM | POA: Diagnosis not present

## 2016-01-03 DIAGNOSIS — I10 Essential (primary) hypertension: Secondary | ICD-10-CM | POA: Diagnosis not present

## 2016-01-03 DIAGNOSIS — F419 Anxiety disorder, unspecified: Secondary | ICD-10-CM | POA: Diagnosis not present

## 2016-01-05 DIAGNOSIS — I10 Essential (primary) hypertension: Secondary | ICD-10-CM | POA: Diagnosis not present

## 2016-01-05 DIAGNOSIS — I2510 Atherosclerotic heart disease of native coronary artery without angina pectoris: Secondary | ICD-10-CM | POA: Diagnosis not present

## 2016-01-05 DIAGNOSIS — F419 Anxiety disorder, unspecified: Secondary | ICD-10-CM | POA: Diagnosis not present

## 2016-01-05 DIAGNOSIS — I739 Peripheral vascular disease, unspecified: Secondary | ICD-10-CM | POA: Diagnosis not present

## 2016-01-05 DIAGNOSIS — M4727 Other spondylosis with radiculopathy, lumbosacral region: Secondary | ICD-10-CM | POA: Diagnosis not present

## 2016-01-05 DIAGNOSIS — M6281 Muscle weakness (generalized): Secondary | ICD-10-CM | POA: Diagnosis not present

## 2016-01-08 ENCOUNTER — Other Ambulatory Visit: Payer: Self-pay | Admitting: Internal Medicine

## 2016-01-09 DIAGNOSIS — F419 Anxiety disorder, unspecified: Secondary | ICD-10-CM | POA: Diagnosis not present

## 2016-01-09 DIAGNOSIS — I739 Peripheral vascular disease, unspecified: Secondary | ICD-10-CM | POA: Diagnosis not present

## 2016-01-09 DIAGNOSIS — M4727 Other spondylosis with radiculopathy, lumbosacral region: Secondary | ICD-10-CM | POA: Diagnosis not present

## 2016-01-09 DIAGNOSIS — I10 Essential (primary) hypertension: Secondary | ICD-10-CM | POA: Diagnosis not present

## 2016-01-09 DIAGNOSIS — M6281 Muscle weakness (generalized): Secondary | ICD-10-CM | POA: Diagnosis not present

## 2016-01-09 DIAGNOSIS — I2510 Atherosclerotic heart disease of native coronary artery without angina pectoris: Secondary | ICD-10-CM | POA: Diagnosis not present

## 2016-01-10 ENCOUNTER — Telehealth: Payer: Self-pay | Admitting: Hematology and Oncology

## 2016-01-10 ENCOUNTER — Encounter: Payer: Self-pay | Admitting: Hematology and Oncology

## 2016-01-10 DIAGNOSIS — I10 Essential (primary) hypertension: Secondary | ICD-10-CM | POA: Diagnosis not present

## 2016-01-10 DIAGNOSIS — F419 Anxiety disorder, unspecified: Secondary | ICD-10-CM | POA: Diagnosis not present

## 2016-01-10 DIAGNOSIS — I2510 Atherosclerotic heart disease of native coronary artery without angina pectoris: Secondary | ICD-10-CM | POA: Diagnosis not present

## 2016-01-10 DIAGNOSIS — M4727 Other spondylosis with radiculopathy, lumbosacral region: Secondary | ICD-10-CM | POA: Diagnosis not present

## 2016-01-10 DIAGNOSIS — M6281 Muscle weakness (generalized): Secondary | ICD-10-CM | POA: Diagnosis not present

## 2016-01-10 DIAGNOSIS — I739 Peripheral vascular disease, unspecified: Secondary | ICD-10-CM | POA: Diagnosis not present

## 2016-01-10 NOTE — Telephone Encounter (Signed)
Appt scheduled with Dr. Lindi Adie for Tues. 10/17 at 1pm. Patient aware to arrive 30 minutes early. Demographics verifed. Letter mailed to the pt.

## 2016-01-11 ENCOUNTER — Other Ambulatory Visit: Payer: Self-pay | Admitting: Internal Medicine

## 2016-01-12 ENCOUNTER — Encounter: Payer: Medicare Other | Admitting: Internal Medicine

## 2016-01-13 DIAGNOSIS — M4727 Other spondylosis with radiculopathy, lumbosacral region: Secondary | ICD-10-CM | POA: Diagnosis not present

## 2016-01-13 DIAGNOSIS — F419 Anxiety disorder, unspecified: Secondary | ICD-10-CM | POA: Diagnosis not present

## 2016-01-13 DIAGNOSIS — M6281 Muscle weakness (generalized): Secondary | ICD-10-CM | POA: Diagnosis not present

## 2016-01-13 DIAGNOSIS — I739 Peripheral vascular disease, unspecified: Secondary | ICD-10-CM | POA: Diagnosis not present

## 2016-01-13 DIAGNOSIS — I10 Essential (primary) hypertension: Secondary | ICD-10-CM | POA: Diagnosis not present

## 2016-01-13 DIAGNOSIS — I2510 Atherosclerotic heart disease of native coronary artery without angina pectoris: Secondary | ICD-10-CM | POA: Diagnosis not present

## 2016-01-17 DIAGNOSIS — I739 Peripheral vascular disease, unspecified: Secondary | ICD-10-CM | POA: Diagnosis not present

## 2016-01-17 DIAGNOSIS — M4727 Other spondylosis with radiculopathy, lumbosacral region: Secondary | ICD-10-CM | POA: Diagnosis not present

## 2016-01-17 DIAGNOSIS — F419 Anxiety disorder, unspecified: Secondary | ICD-10-CM | POA: Diagnosis not present

## 2016-01-17 DIAGNOSIS — I10 Essential (primary) hypertension: Secondary | ICD-10-CM | POA: Diagnosis not present

## 2016-01-17 DIAGNOSIS — M6281 Muscle weakness (generalized): Secondary | ICD-10-CM | POA: Diagnosis not present

## 2016-01-17 DIAGNOSIS — I2510 Atherosclerotic heart disease of native coronary artery without angina pectoris: Secondary | ICD-10-CM | POA: Diagnosis not present

## 2016-01-18 DIAGNOSIS — M4727 Other spondylosis with radiculopathy, lumbosacral region: Secondary | ICD-10-CM | POA: Diagnosis not present

## 2016-01-18 DIAGNOSIS — M6281 Muscle weakness (generalized): Secondary | ICD-10-CM | POA: Diagnosis not present

## 2016-01-18 DIAGNOSIS — I739 Peripheral vascular disease, unspecified: Secondary | ICD-10-CM | POA: Diagnosis not present

## 2016-01-18 DIAGNOSIS — F419 Anxiety disorder, unspecified: Secondary | ICD-10-CM | POA: Diagnosis not present

## 2016-01-18 DIAGNOSIS — I2510 Atherosclerotic heart disease of native coronary artery without angina pectoris: Secondary | ICD-10-CM | POA: Diagnosis not present

## 2016-01-18 DIAGNOSIS — I10 Essential (primary) hypertension: Secondary | ICD-10-CM | POA: Diagnosis not present

## 2016-01-31 ENCOUNTER — Ambulatory Visit (HOSPITAL_BASED_OUTPATIENT_CLINIC_OR_DEPARTMENT_OTHER): Payer: Medicare Other | Admitting: Hematology and Oncology

## 2016-01-31 ENCOUNTER — Encounter: Payer: Self-pay | Admitting: Hematology and Oncology

## 2016-01-31 VITALS — BP 165/59 | HR 83 | Temp 98.5°F | Resp 18 | Wt 148.6 lb

## 2016-01-31 DIAGNOSIS — D472 Monoclonal gammopathy: Secondary | ICD-10-CM

## 2016-01-31 NOTE — Assessment & Plan Note (Signed)
Elevated IgG lambda monoclonal protein, could not be measured Found on routine testing for dizziness and lightheadedness and weight loss.  Pathology review: I discussed the lab report in great detail. I explained to him the difference between an abnormal monoclonal protein versus normal protein. I discussed with him the pathogenesis of immunoglobulins from plasma cells. I discussed with them the difference between MGUS and multiple myeloma. The risk of transformation MGUS to myeloma is about 1% per year.  Dizziness and lightheadedness: I discussed with them that it is not related to the MGUS. Weight loss: Also not related to MGUS. Renal failure: Related to his prior antihypertensive regimen. It has improved since he stopped taking his antihypertensive medications.  Recommendation: Annual blood work for serum protein electrophoresis. Patient will return back to see [place] in 1 year for labs and follow-up.

## 2016-01-31 NOTE — Progress Notes (Signed)
[facility] CONSULT NOTE  Patient Care Team: Cassandria Anger, MD as PCP - General  CHIEF COMPLAINTS/PURPOSE OF CONSULTATION:  Elevated monoclonal protein  HISTORY OF PRESENTING ILLNESS:  [name] [name] 82 y.o. male is here because of recent diagnosis of abnormal serum protein electrophoresis which showed a small amount of IgG lambda monoclonal protein. Patient had dizziness and lightheadedness and fatigue and weight loss. Her primary care physician obtain all of this testing. The serum protein electrophoresis revealed a very small amount of monoclonal protein that could not even be measured. It was IgG lambda type. She is here today accompanied by his daughter. Patient was previously on diuretics and they were stopped and since then he is feeling a lot better.  I reviewed her records extensively and collaborated the history with the patient.  MEDICAL HISTORY:  Past Medical History:  Diagnosis Date  . Anxiety   . Arthritis   . Carotid artery disease ([place])   . Coronary artery disease    sees Dr. Verl Blalock  . Depression   . DJD (degenerative joint disease)    right hip  . GERD (gastroesophageal reflux disease)    hx of  . Glaucoma   . Glucose intolerance (impaired glucose tolerance)   . Headache(784.0)    hx of migraines  . Herpes zoster 2009   r. scalp and opth  . Hx of colonic polyps 2004   hyperplastic  . Hyperlipidemia   . Hypertension    sees Dr. Adelene Amas  . Internal and external hemorrhoids without complication   . Low back pain    facet arthropathy  . Lumbago   . Peripheral vascular disease ([place])   . Pneumonia 2009   rll with a small effusion  . Postherpetic neuralgia 2009    SURGICAL HISTORY: Past Surgical History:  Procedure Laterality Date  . ACNE CYST REMOVAL  1970's   removed x2 off the back  . CARPAL TUNNEL RELEASE     left  . CATARACT EXTRACTION W/PHACO  02/13/2012   Procedure: CATARACT EXTRACTION PHACO AND INTRAOCULAR LENS PLACEMENT  (IOC);  Surgeon: Adonis Brook, MD;  Location: [facility];  Service: Ophthalmology;  Laterality: Left;  . COLONOSCOPY    . CORONARY ARTERY BYPASS GRAFT  2002  . REPOSITION OF LENS  03/14/2012   Procedure: REPOSITION OF LENS;  Surgeon: Adonis Brook, MD;  Location: [facility];  Service: Ophthalmology;  Laterality: Left;  Reposition Ocular Lens Left Eye  . thumb tendon surgery     left  . TONSILLECTOMY     age 67    SOCIAL HISTORY: Social History   Social History  . Marital status: Divorced    Spouse name: N/A  . Number of children: 3  . Years of education: N/A   Occupational History  .  Retired   Social History Main Topics  . Smoking status: Former Smoker    Packs/day: 0.75    Years: 25.00  . Smokeless tobacco: Never Used     Comment: quit at age 22  . Alcohol use 0.0 oz/week     Comment: infrequent glass of wine  . Drug use: No  . Sexual activity: Not Currently   Other Topics Concern  . Not on file   Social History Narrative  . No narrative on file    FAMILY HISTORY: Family History  Problem Relation Age of Onset  . Breast cancer Mother   . Pulmonary embolism Father   . Colon cancer Neg Hx   .  Esophageal cancer Neg Hx     ALLERGIES:  is allergic to benazepril hcl; cosopt [dorzolamide hcl-timolol mal]; ezetimibe-simvastatin; and lipitor [atorvastatin].  MEDICATIONS:  Current Outpatient Prescriptions  Medication Sig Dispense Refill  . amLODipine (NORVASC) 5 MG tablet TAKE 1 TABLET DAILY 90 tablet 3  . Cholecalciferol (VITAMIN D3) 1000 UNITS CAPS Take 1 capsule by mouth daily.      . clopidogrel (PLAVIX) 75 MG tablet TAKE 1 TABLET DAILY 90 tablet 3  . cycloSPORINE (RESTASIS) 0.05 % ophthalmic emulsion Place 1 drop into both eyes 2 (two) times daily.    Marland Kitchen ezetimibe (ZETIA) 10 MG tablet TAKE 1 TABLET DAILY 90 tablet 3  . nitroGLYCERIN (NITROSTAT) 0.4 MG SL tablet Place 1 tablet (0.4 mg total) under the tongue every 5 (five) minutes as needed. For chest pain 20 tablet 3  .  rosuvastatin (CRESTOR) 20 MG tablet TAKE 1 TABLET DAILY 90 tablet 3  . TRAVATAN Z 0.004 % SOLN ophthalmic solution Place 1 drop into both eyes daily.     No current facility-administered medications for this visit.     REVIEW OF SYSTEMS:   Constitutional: Denies fevers, chills or abnormal night sweats Eyes: Denies blurriness of vision, double vision or watery eyes Ears, nose, mouth, throat, and face: Denies mucositis or sore throat Respiratory: Denies cough, dyspnea or wheezes Cardiovascular: Denies palpitation, chest discomfort or lower extremity swelling Gastrointestinal:  Denies nausea, heartburn or change in bowel habits Skin: Denies abnormal skin rashes Lymphatics: Denies new lymphadenopathy or easy bruising Neurological:Denies numbness, tingling or new weaknesses Behavioral/Psych: Mood is stable, no new changes   All other systems were reviewed with the patient and are negative.  PHYSICAL EXAMINATION: ECOG PERFORMANCE STATUS: 1 - Symptomatic but completely ambulatory  Vitals:   01/31/16 1329  BP: (!) 165/59  Pulse: 83  Resp: 18  Temp: 98.5 F (36.9 C)   Filed Weights   01/31/16 1329  Weight: 148 lb 9.6 oz (67.4 kg)    GENERAL:alert, no distress and comfortable SKIN: skin color, texture, turgor are normal, no rashes or significant lesions EYES: normal, conjunctiva are pink and non-injected, sclera clear OROPHARYNX:no exudate, no erythema and lips, buccal mucosa, and tongue normal  NECK: supple, thyroid normal size, non-tender, without nodularity LYMPH:  no palpable lymphadenopathy in the cervical, axillary or inguinal LUNGS: clear to auscultation and percussion with normal breathing effort HEART: Pansystolic murmur in the mitral area, lower extremity swelling especially on the right leg ABDOMEN:abdomen soft, non-tender and normal bowel sounds Musculoskeletal:no cyanosis of digits and no clubbing  PSYCH: alert & oriented x 3 with fluent speech NEURO: no focal  motor/sensory deficits  LABORATORY DATA:  I have reviewed the data as listed Lab Results  Component Value Date   WBC 5.2 12/24/2015   HGB 9.8 (L) 12/24/2015   HCT 29.6 (L) 12/24/2015   MCV 78.0 (L) 12/24/2015   PLT 173 12/24/2015   Lab Results  Component Value Date   NA 141 12/25/2015   K 3.9 12/25/2015   CL 117 (H) 12/25/2015   CO2 20 (L) 12/25/2015    RADIOGRAPHIC STUDIES: I have personally reviewed the radiological reports and agreed with the findings in the report.  ASSESSMENT AND PLAN:  MGUS (monoclonal gammopathy of unknown significance) Elevated IgG lambda monoclonal protein, could not be measured Found on routine testing for dizziness and lightheadedness and weight loss.  Pathology review: I discussed the lab report in great detail. I explained to him the difference between an abnormal monoclonal protein versus  normal protein. I discussed with him the pathogenesis of immunoglobulins from plasma cells. I discussed with them the difference between MGUS and multiple myeloma. The risk of transformation MGUS to myeloma is about 1% per year.  Dizziness and lightheadedness: I discussed with them that it is not related to the MGUS. Weight loss: Also not related to MGUS. Renal failure: Related to his prior antihypertensive regimen. It has improved since he stopped taking his antihypertensive medications.  Recommendation: Annual blood work for serum protein electrophoresis. Patient will return back to see [place] in 1 year for labs and follow-up.   All questions were answered. The patient knows to call the clinic with any problems, questions or concerns.    Rulon Eisenmenger, MD 01/31/16

## 2016-02-13 ENCOUNTER — Encounter: Payer: Self-pay | Admitting: Internal Medicine

## 2016-02-13 ENCOUNTER — Ambulatory Visit (INDEPENDENT_AMBULATORY_CARE_PROVIDER_SITE_OTHER): Payer: Medicare Other | Admitting: Internal Medicine

## 2016-02-13 DIAGNOSIS — R42 Dizziness and giddiness: Secondary | ICD-10-CM | POA: Diagnosis not present

## 2016-02-13 DIAGNOSIS — I2510 Atherosclerotic heart disease of native coronary artery without angina pectoris: Secondary | ICD-10-CM

## 2016-02-13 DIAGNOSIS — R634 Abnormal weight loss: Secondary | ICD-10-CM | POA: Diagnosis not present

## 2016-02-13 DIAGNOSIS — N179 Acute kidney failure, unspecified: Secondary | ICD-10-CM | POA: Diagnosis not present

## 2016-02-13 MED ORDER — LORATADINE 10 MG PO TABS: 10.0000 mg | ORAL_TABLET | Freq: Every day | ORAL | 3 refills | Status: DC

## 2016-02-13 NOTE — Progress Notes (Signed)
Pre visit review using our clinic review tool, if applicable. No additional management support is needed unless otherwise documented below in the visit note. 

## 2016-02-13 NOTE — Assessment & Plan Note (Signed)
Improving post E coli enteritis - on diet

## 2016-02-13 NOTE — Assessment & Plan Note (Signed)
ENT ref 

## 2016-02-13 NOTE — Progress Notes (Signed)
Subjective:  Patient ID: [name] [name], male    DOB: 29-Dec-1932  Age: 82 y.o. MRN: [id number]  CC: No chief complaint on file.   HPI [name] [name] presents for wt loss - better, dizziness, ARF  better f/u  Outpatient Medications Prior to Visit  Medication Sig Dispense Refill  . amLODipine (NORVASC) 5 MG tablet TAKE 1 TABLET DAILY 90 tablet 3  . Cholecalciferol (VITAMIN D3) 1000 UNITS CAPS Take 1 capsule by mouth daily.      . clopidogrel (PLAVIX) 75 MG tablet TAKE 1 TABLET DAILY 90 tablet 3  . cycloSPORINE (RESTASIS) 0.05 % ophthalmic emulsion Place 1 drop into both eyes 2 (two) times daily.    Marland Kitchen ezetimibe (ZETIA) 10 MG tablet TAKE 1 TABLET DAILY 90 tablet 3  . nitroGLYCERIN (NITROSTAT) 0.4 MG SL tablet Place 1 tablet (0.4 mg total) under the tongue every 5 (five) minutes as needed. For chest pain 20 tablet 3  . rosuvastatin (CRESTOR) 20 MG tablet TAKE 1 TABLET DAILY 90 tablet 3  . TRAVATAN Z 0.004 % SOLN ophthalmic solution Place 1 drop into both eyes daily.     No facility-administered medications prior to visit.     ROS Review of Systems  Constitutional: Negative for appetite change, fatigue and unexpected weight change.  HENT: Negative for congestion, nosebleeds, sneezing, sore throat and trouble swallowing.   Eyes: Negative for itching and visual disturbance.  Respiratory: Negative for cough.   Cardiovascular: Negative for chest pain, palpitations and leg swelling.  Gastrointestinal: Negative for abdominal distention, blood in stool, diarrhea and nausea.  Genitourinary: Negative for frequency and hematuria.  Musculoskeletal: Positive for gait problem. Negative for back pain, joint swelling and neck pain.  Skin: Negative for rash.  Neurological: Negative for dizziness, tremors, speech difficulty and weakness.  Psychiatric/Behavioral: Negative for agitation, dysphoric mood and sleep disturbance. The patient is not nervous/anxious.     Objective:  BP (!) 150/68   Pulse 70    Wt 153 lb (69.4 kg)   SpO2 97%   BMI 23.96 kg/m   BP Readings from Last 3 Encounters:  02/13/16 (!) 150/68  01/31/16 (!) 165/59  12/29/15 140/78    Wt Readings from Last 3 Encounters:  02/13/16 153 lb (69.4 kg)  01/31/16 148 lb 9.6 oz (67.4 kg)  12/29/15 150 lb (68 kg)    Physical Exam  Constitutional: He is oriented to person, place, and time. He appears well-developed. No distress.  NAD  HENT:  Mouth/Throat: Oropharynx is clear and moist.  Eyes: Conjunctivae are normal. Pupils are equal, round, and reactive to light.  Neck: Normal range of motion. No JVD present. No thyromegaly present.  Cardiovascular: Normal rate, regular rhythm and intact distal pulses.  Exam reveals no gallop and no friction rub.   Murmur heard. Pulmonary/Chest: Effort normal and breath sounds normal. No respiratory distress. He has no wheezes. He has no rales. He exhibits no tenderness.  Abdominal: Soft. Bowel sounds are normal. He exhibits no distension and no mass. There is no tenderness. There is no rebound and no guarding.  Musculoskeletal: Normal range of motion. He exhibits no edema or tenderness.  Lymphadenopathy:    He has no cervical adenopathy.  Neurological: He is alert and oriented to person, place, and time. He has normal reflexes. No cranial nerve deficit. He exhibits normal muscle tone. He displays a negative Romberg sign. Coordination and gait normal.  Skin: Skin is warm and dry. No rash noted.  Psychiatric: He has a  normal mood and affect. His behavior is normal. Judgment and thought content normal.  Slow movements c/w age  Lab Results  Component Value Date   WBC 5.2 12/24/2015   HGB 9.8 (L) 12/24/2015   HCT 29.6 (L) 12/24/2015   PLT 173 12/24/2015   GLUCOSE 85 12/25/2015   CHOL 160 10/14/2014   TRIG 92.0 10/14/2014   HDL 52.70 10/14/2014   LDLDIRECT 136.2 09/28/2009   LDLCALC 89 10/14/2014   ALT 13 (L) 12/23/2015   AST 23 12/23/2015   NA 141 12/25/2015   K 3.9 12/25/2015     CL 117 (H) 12/25/2015   CREATININE 0.90 12/25/2015   BUN 23 (H) 12/25/2015   CO2 20 (L) 12/25/2015   TSH 1.34 12/22/2015   PSA 0.38 10/14/2014   INR 1.1 ratio (H) 10/06/2009   HGBA1C 5.6 12/09/2013    US Renal  Result Date: 12/24/2015 CLINICAL DATA:  Back pain, renal failure EXAM: RENAL / URINARY TRACT ULTRASOUND COMPLETE COMPARISON:  08/31/2015 FINDINGS: Right Kidney: Length: 11 cm. Normal echogenicity. No hydronephrosis. A cyst in upper pole measures 3.2 x 2.6 cm. A cyst in lower pole measures 1.5 x 1.2 cm. Left Kidney: Length: 11.5 cm. Normal echogenicity. No hydronephrosis. Cyst in lower pole measures 1.9 by 1.2 cm. Second left renal cyst measures 8 x 7 mm. Bladder: Appears normal for degree of bladder distention. Bilateral ureteral jets are visualized. IMPRESSION: 1. No hydronephrosis. Bilateral renal cysts. Unremarkable urinary bladder. Electronically Signed   By: Lahoma Crocker M.D.   On: 12/24/2015 12:53    Assessment & Plan:   There are no diagnoses linked to this encounter. I am having [name] maintain his Vitamin D3, nitroGLYCERIN, amLODipine, TRAVATAN Z, rosuvastatin, cycloSPORINE, ezetimibe, and clopidogrel.  No orders of the defined types were placed in this encounter.    Follow-up: No Follow-up on file.  Walker Kehr, MD

## 2016-02-14 ENCOUNTER — Encounter: Payer: Self-pay | Admitting: Internal Medicine

## 2016-02-14 NOTE — Assessment & Plan Note (Signed)
Lab Results  Component Value Date   CREATININE 0.90 12/25/2015

## 2016-02-29 ENCOUNTER — Ambulatory Visit: Payer: Medicare Other | Admitting: Internal Medicine

## 2016-03-05 DIAGNOSIS — M6281 Muscle weakness (generalized): Secondary | ICD-10-CM | POA: Diagnosis not present

## 2016-03-05 DIAGNOSIS — F329 Major depressive disorder, single episode, unspecified: Secondary | ICD-10-CM

## 2016-03-05 DIAGNOSIS — Z87891 Personal history of nicotine dependence: Secondary | ICD-10-CM

## 2016-03-05 DIAGNOSIS — G10 Huntington's disease: Secondary | ICD-10-CM | POA: Diagnosis not present

## 2016-03-05 DIAGNOSIS — I2510 Atherosclerotic heart disease of native coronary artery without angina pectoris: Secondary | ICD-10-CM | POA: Diagnosis not present

## 2016-03-05 DIAGNOSIS — Z9181 History of falling: Secondary | ICD-10-CM

## 2016-03-05 DIAGNOSIS — Z7902 Long term (current) use of antithrombotics/antiplatelets: Secondary | ICD-10-CM

## 2016-03-05 DIAGNOSIS — I739 Peripheral vascular disease, unspecified: Secondary | ICD-10-CM

## 2016-03-05 DIAGNOSIS — M4727 Other spondylosis with radiculopathy, lumbosacral region: Secondary | ICD-10-CM | POA: Diagnosis not present

## 2016-03-05 DIAGNOSIS — F419 Anxiety disorder, unspecified: Secondary | ICD-10-CM

## 2016-03-07 DIAGNOSIS — H25811 Combined forms of age-related cataract, right eye: Secondary | ICD-10-CM | POA: Diagnosis not present

## 2016-03-07 DIAGNOSIS — H16223 Keratoconjunctivitis sicca, not specified as Sjogren's, bilateral: Secondary | ICD-10-CM | POA: Diagnosis not present

## 2016-03-07 DIAGNOSIS — H401132 Primary open-angle glaucoma, bilateral, moderate stage: Secondary | ICD-10-CM | POA: Diagnosis not present

## 2016-03-29 DIAGNOSIS — J310 Chronic rhinitis: Secondary | ICD-10-CM | POA: Diagnosis not present

## 2016-03-29 DIAGNOSIS — R42 Dizziness and giddiness: Secondary | ICD-10-CM | POA: Diagnosis not present

## 2016-06-05 ENCOUNTER — Telehealth: Payer: Self-pay | Admitting: Internal Medicine

## 2016-06-05 NOTE — Telephone Encounter (Signed)
Called patient to schedule awv. Lvm for patient to call office to schedule appt.  °

## 2016-06-07 DIAGNOSIS — H04123 Dry eye syndrome of bilateral lacrimal glands: Secondary | ICD-10-CM | POA: Diagnosis not present

## 2016-06-07 DIAGNOSIS — H25811 Combined forms of age-related cataract, right eye: Secondary | ICD-10-CM | POA: Diagnosis not present

## 2016-06-07 DIAGNOSIS — H401132 Primary open-angle glaucoma, bilateral, moderate stage: Secondary | ICD-10-CM | POA: Diagnosis not present

## 2016-06-12 ENCOUNTER — Other Ambulatory Visit: Payer: Self-pay | Admitting: *Deleted

## 2016-06-12 MED ORDER — AMLODIPINE BESYLATE 5 MG PO TABS: 5.0000 mg | ORAL_TABLET | Freq: Every day | ORAL | 2 refills | Status: DC

## 2016-06-15 ENCOUNTER — Ambulatory Visit: Payer: Medicare Other | Admitting: Internal Medicine

## 2016-06-28 NOTE — Progress Notes (Signed)
Pre visit review using our clinic review tool, if applicable. No additional management support is needed unless otherwise documented below in the visit note. 

## 2016-06-28 NOTE — Progress Notes (Addendum)
Subjective:   [name] [name] is a 83 y.o. male who presents for an Initial Medicare Annual Wellness Visit.  Review of Systems  No ROS.  Medicare Wellness Visit.  Cardiac Risk Factors include: advanced age (>57men, >52 women);dyslipidemia;hypertension;male gender Sleep patterns: no sleep issues, feels rested on waking, gets up 1 times nightly to void and sleeps 7-8 hours nightly.   Home Safety/Smoke Alarms: Feels safe in home. Smoke alarms in place.   Living environment; residence and Firearm Safety: 1-story house/ trailer, equipment: Radio producer, Type: [place], no firearms. Lives with daughter Seat Belt Safety/Bike Helmet: Wears seat belt.   Counseling:   Eye Exam- every 4 months Dental- resources given  Male:   CCS- N/A aged out    PSA-  Lab Results  Component Value Date   PSA 0.38 10/14/2014   PSA 0.40 09/04/2013   PSA 0.41 07/02/2011       Objective:    Today's Vitals   06/29/16 1440  BP: (!) 168/64  Pulse: 85  Resp: 16  Temp: 98.5 F (36.9 C)  TempSrc: Oral  SpO2: 98%  Weight: 152 lb (68.9 kg)  Height: 5\' 7"  (1.702 m)   Body mass index is 23.81 kg/m.  Current Medications (verified) Outpatient Encounter Prescriptions as of 06/29/2016  Medication Sig  . amLODipine (NORVASC) 5 MG tablet Take 1 tablet (5 mg total) by mouth daily.  . Cholecalciferol (VITAMIN D3) 1000 UNITS CAPS Take 1 capsule by mouth daily.    . clopidogrel (PLAVIX) 75 MG tablet TAKE 1 TABLET DAILY  . cycloSPORINE (RESTASIS) 0.05 % ophthalmic emulsion Place 1 drop into both eyes 2 (two) times daily.  Marland Kitchen ezetimibe (ZETIA) 10 MG tablet TAKE 1 TABLET DAILY  . ibuprofen (ADVIL,MOTRIN) 200 MG tablet Take 200 mg by mouth every 6 (six) hours as needed.  . loratadine (CLARITIN) 10 MG tablet Take 1 tablet (10 mg total) by mouth daily.  . nitroGLYCERIN (NITROSTAT) 0.4 MG SL tablet Place 1 tablet (0.4 mg total) under the tongue every 5 (five) minutes as needed. For chest pain  . rosuvastatin  (CRESTOR) 20 MG tablet TAKE 1 TABLET DAILY  . TRAVATAN Z 0.004 % SOLN ophthalmic solution Place 1 drop into both eyes daily.   No facility-administered encounter medications on file as of 06/29/2016.     Allergies (verified) Benazepril hcl; Cosopt [dorzolamide hcl-timolol mal]; Ezetimibe-simvastatin; and Lipitor [atorvastatin]   History: Past Medical History:  Diagnosis Date  . Anxiety   . Arthritis   . Carotid artery disease ([place])   . Coronary artery disease    sees Dr. Verl Blalock  . Depression   . DJD (degenerative joint disease)    right hip  . GERD (gastroesophageal reflux disease)    hx of  . Glaucoma   . Glucose intolerance (impaired glucose tolerance)   . Headache(784.0)    hx of migraines  . Herpes zoster 2009   r. scalp and opth  . Hx of colonic polyps 2004   hyperplastic  . Hyperlipidemia   . Hypertension    sees Dr. Adelene Amas  . Internal and external hemorrhoids without complication   . Low back pain    facet arthropathy  . Lumbago   . Peripheral vascular disease ([place])   . Pneumonia 2009   rll with a small effusion  . Postherpetic neuralgia 2009   Past Surgical History:  Procedure Laterality Date  . ACNE CYST REMOVAL  1970's   removed x2 off the back  . CARPAL TUNNEL  RELEASE     left  . CATARACT EXTRACTION W/PHACO  02/13/2012   Procedure: CATARACT EXTRACTION PHACO AND INTRAOCULAR LENS PLACEMENT (IOC);  Surgeon: Adonis Brook, MD;  Location: [facility];  Service: Ophthalmology;  Laterality: Left;  . COLONOSCOPY    . CORONARY ARTERY BYPASS GRAFT  2002  . REPOSITION OF LENS  03/14/2012   Procedure: REPOSITION OF LENS;  Surgeon: Adonis Brook, MD;  Location: [facility];  Service: Ophthalmology;  Laterality: Left;  Reposition Ocular Lens Left Eye  . thumb tendon surgery     left  . TONSILLECTOMY     age 50   Family History  Problem Relation Age of Onset  . Breast cancer Mother   . Pulmonary embolism Father   . Colon cancer Neg Hx   . Esophageal cancer Neg Hx     Social History   Occupational History  .  Retired   Social History Main Topics  . Smoking status: Former Smoker    Packs/day: 0.75    Years: 25.00  . Smokeless tobacco: Never Used     Comment: quit at age 52  . Alcohol use 0.0 oz/week     Comment: infrequent glass of wine  . Drug use: No  . Sexual activity: Not Currently   Tobacco Counseling Counseling given: Not Answered   Activities of Daily Living In your present state of health, do you have any difficulty performing the following activities: 06/29/2016 12/23/2015  Hearing? N N  Vision? N N  Difficulty concentrating or making decisions? Y N  Walking or climbing stairs? N N  Dressing or bathing? N N  Doing errands, shopping? Y N  Preparing Food and eating ? N -  Using the Toilet? N -  In the past six months, have you accidently leaked urine? Y -  Do you have problems with loss of bowel control? N -  Managing your Medications? N -  Managing your Finances? N -  Housekeeping or managing your Housekeeping? N -  Some recent data might be hidden    Immunizations and Health Maintenance Immunization History  Administered Date(s) Administered  . Pneumococcal Conjugate-13 07/14/2014  . Pneumococcal Polysaccharide-23 08/17/2015  . Td 10/03/2009   Health Maintenance Due  Topic Date Due  . FOOT EXAM  11/29/1944  . OPHTHALMOLOGY EXAM  11/29/1944  . URINE MICROALBUMIN  11/29/1944  . HEMOGLOBIN A1C  06/11/2014    Patient Care Team: Cassandria Anger, MD as PCP - General  Indicate any recent Medical Services you may have received from other than [facility] providers in the past year (date may be approximate).    Assessment:   This is a routine wellness examination for [name] [name]. Physical assessment deferred to PCP.   Hearing/Vision screen Hearing Screening Comments: States right ear eustatchian tube blockage, treated by audiologist Vision Screening Comments: Wear glasses, glaucoma  Dietary issues and exercise activities  discussed: Current Exercise Habits: Home exercise routine, Type of exercise: walking;stretching, Frequency (Times/Week): 3, Intensity: Mild, Exercise limited by: None identified Diet (meal preparation, eat out, water intake, caffeinated beverages, dairy products, fruits and vegetables): in general, a "healthy" diet  , well balanced, low fat/ cholesterol, low salt  Drinks 3-4 bottles of water per day  Goals    . maintain current  health status          Continue to eat healthy and exercise      Depression Screen PHQ 2/9 Scores 06/29/2016 08/17/2015 07/14/2014  PHQ - 2 Score 1 2 0  PHQ-  9 Score - 4 -    Fall Risk Fall Risk  06/29/2016 06/29/2016 08/17/2015 07/14/2014  Falls in the past year? - Yes No Yes  Number falls in past yr: - 1 - 1  Injury with Fall? No No - Yes  Risk for fall due to : - Impaired vision;Impaired mobility - -  Follow up - Falls prevention discussed;Education provided - -    Cognitive Function: MMSE - Mini Mental State Exam 06/29/2016  Orientation to time 4  Orientation to Place 5  Registration 3  Attention/ Calculation 4  Recall 1  Language- name 2 objects 2  Language- repeat 1  Language- follow 3 step command 2  Language- read & follow direction 1  Write a sentence 1  Copy design 1  Total score 25        Screening Tests Health Maintenance  Topic Date Due  . FOOT EXAM  11/29/1944  . OPHTHALMOLOGY EXAM  11/29/1944  . URINE MICROALBUMIN  11/29/1944  . HEMOGLOBIN A1C  06/11/2014  . INFLUENZA VACCINE  07/14/2017 (Originally 11/15/2015)  . TETANUS/TDAP  10/04/2019  . PNA vac Low Risk Adult  Completed        Plan:    Continue to eat heart healthy diet (full of fruits, vegetables, whole grains, lean protein, water--limit salt, fat, and sugar intake) and increase physical activity as tolerated.  Start doing brain stimulating activities (puzzles, reading, adult coloring books, staying active) to keep memory sharp.   Patient will keep feet clean and dry,  will apply Lamisil per Dr. Judeen Hammans recommendation.   During the course of the visit Saint was educated and counseled about the following appropriate screening and preventive services:   Vaccines to include Pneumoccal, Influenza, Hepatitis B, Td, Zostavax, HCV  Colorectal cancer screening  Cardiovascular disease screening  Diabetes screening  Glaucoma screening  Nutrition counseling  Prostate cancer screening  Patient Instructions (the written plan) were given to the patient.   Michiel Cowboy, RN   06/29/2016

## 2016-06-29 ENCOUNTER — Other Ambulatory Visit (INDEPENDENT_AMBULATORY_CARE_PROVIDER_SITE_OTHER): Payer: Medicare Other

## 2016-06-29 ENCOUNTER — Encounter: Payer: Self-pay | Admitting: Internal Medicine

## 2016-06-29 ENCOUNTER — Ambulatory Visit (INDEPENDENT_AMBULATORY_CARE_PROVIDER_SITE_OTHER): Payer: Medicare Other | Admitting: Internal Medicine

## 2016-06-29 VITALS — BP 168/64 | HR 85 | Temp 98.5°F | Resp 16 | Ht 67.0 in | Wt 152.0 lb

## 2016-06-29 DIAGNOSIS — N320 Bladder-neck obstruction: Secondary | ICD-10-CM | POA: Diagnosis not present

## 2016-06-29 DIAGNOSIS — Z0000 Encounter for general adult medical examination without abnormal findings: Secondary | ICD-10-CM

## 2016-06-29 DIAGNOSIS — B353 Tinea pedis: Secondary | ICD-10-CM | POA: Insufficient documentation

## 2016-06-29 DIAGNOSIS — E785 Hyperlipidemia, unspecified: Secondary | ICD-10-CM

## 2016-06-29 DIAGNOSIS — Z125 Encounter for screening for malignant neoplasm of prostate: Secondary | ICD-10-CM

## 2016-06-29 LAB — CBC WITH DIFFERENTIAL/PLATELET
BASOS ABS: 0 10*3/uL (ref 0.0–0.1)
Basophils Relative: 0.2 % (ref 0.0–3.0)
Eosinophils Absolute: 0.1 10*3/uL (ref 0.0–0.7)
Eosinophils Relative: 1.5 % (ref 0.0–5.0)
HCT: 34.6 % — ABNORMAL LOW (ref 39.0–52.0)
Hemoglobin: 11 g/dL — ABNORMAL LOW (ref 13.0–17.0)
LYMPHS ABS: 1.4 10*3/uL (ref 0.7–4.0)
Lymphocytes Relative: 20.4 % (ref 12.0–46.0)
MCHC: 31.7 g/dL (ref 30.0–36.0)
MCV: 72.9 fl — AB (ref 78.0–100.0)
MONO ABS: 0.6 10*3/uL (ref 0.1–1.0)
Monocytes Relative: 8.5 % (ref 3.0–12.0)
NEUTROS ABS: 4.9 10*3/uL (ref 1.4–7.7)
Neutrophils Relative %: 69.4 % (ref 43.0–77.0)
Platelets: 233 10*3/uL (ref 150.0–400.0)
RBC: 4.75 Mil/uL (ref 4.22–5.81)
RDW: 17.3 % — ABNORMAL HIGH (ref 11.5–15.5)
WBC: 7.1 10*3/uL (ref 4.0–10.5)

## 2016-06-29 LAB — URINALYSIS
BILIRUBIN URINE: NEGATIVE
HGB URINE DIPSTICK: NEGATIVE
Ketones, ur: NEGATIVE
Leukocytes, UA: NEGATIVE
NITRITE: NEGATIVE
Specific Gravity, Urine: 1.025 (ref 1.000–1.030)
TOTAL PROTEIN, URINE-UPE24: NEGATIVE
URINE GLUCOSE: NEGATIVE
UROBILINOGEN UA: 0.2 (ref 0.0–1.0)
pH: 5.5 (ref 5.0–8.0)

## 2016-06-29 LAB — BASIC METABOLIC PANEL
BUN: 18 mg/dL (ref 6–23)
CALCIUM: 9.8 mg/dL (ref 8.4–10.5)
CO2: 26 meq/L (ref 19–32)
Chloride: 104 mEq/L (ref 96–112)
Creatinine, Ser: 1.01 mg/dL (ref 0.40–1.50)
GFR: 91.05 mL/min (ref >60.00)
GLUCOSE: 87 mg/dL (ref 70–99)
Potassium: 4.1 mEq/L (ref 3.5–5.1)
Sodium: 136 mEq/L (ref 135–145)

## 2016-06-29 LAB — PSA: PSA: 0.33 ng/mL (ref 0.10–4.00)

## 2016-06-29 LAB — TSH: TSH: 0.96 u[IU]/mL (ref 0.35–4.50)

## 2016-06-29 LAB — HEPATIC FUNCTION PANEL
ALT: 18 U/L (ref 0–53)
AST: 26 U/L (ref 0–37)
Albumin: 4.3 g/dL (ref 3.5–5.2)
Alkaline Phosphatase: 78 U/L (ref 39–117)
BILIRUBIN DIRECT: 0.2 mg/dL (ref 0.0–0.3)
BILIRUBIN TOTAL: 0.8 mg/dL (ref 0.2–1.2)
Total Protein: 6.6 g/dL (ref 6.0–8.3)

## 2016-06-29 NOTE — Progress Notes (Signed)
Subjective:  Patient ID: [name] [name], male    DOB: April 23, 1932  Age: 83 y.o. MRN: [id number]  CC: Medicare Wellness   HPI [name] [name] presents for CAD, PAD, HTN f/u  Outpatient Medications Prior to Visit  Medication Sig Dispense Refill  . amLODipine (NORVASC) 5 MG tablet Take 1 tablet (5 mg total) by mouth daily. 90 tablet 2  . Cholecalciferol (VITAMIN D3) 1000 UNITS CAPS Take 1 capsule by mouth daily.      . clopidogrel (PLAVIX) 75 MG tablet TAKE 1 TABLET DAILY 90 tablet 3  . cycloSPORINE (RESTASIS) 0.05 % ophthalmic emulsion Place 1 drop into both eyes 2 (two) times daily.    Marland Kitchen ezetimibe (ZETIA) 10 MG tablet TAKE 1 TABLET DAILY 90 tablet 3  . loratadine (CLARITIN) 10 MG tablet Take 1 tablet (10 mg total) by mouth daily. 100 tablet 3  . nitroGLYCERIN (NITROSTAT) 0.4 MG SL tablet Place 1 tablet (0.4 mg total) under the tongue every 5 (five) minutes as needed. For chest pain 20 tablet 3  . rosuvastatin (CRESTOR) 20 MG tablet TAKE 1 TABLET DAILY 90 tablet 3  . TRAVATAN Z 0.004 % SOLN ophthalmic solution Place 1 drop into both eyes daily.     No facility-administered medications prior to visit.     ROS Review of Systems  Constitutional: Negative for appetite change, fatigue and unexpected weight change.  HENT: Negative for congestion, nosebleeds, sneezing, sore throat and trouble swallowing.   Eyes: Negative for itching and visual disturbance.  Respiratory: Negative for cough.   Cardiovascular: Negative for chest pain, palpitations and leg swelling.  Gastrointestinal: Negative for abdominal distention, blood in stool, diarrhea and nausea.  Genitourinary: Negative for frequency and hematuria.  Musculoskeletal: Negative for back pain, gait problem, joint swelling and neck pain.  Skin: Negative for rash.  Neurological: Negative for dizziness, tremors, speech difficulty and weakness.  Psychiatric/Behavioral: Positive for decreased concentration. Negative for agitation, dysphoric  mood, sleep disturbance and suicidal ideas. The patient is not nervous/anxious.     Objective:  BP (!) 168/64   Pulse 85   Temp 98.5 F (36.9 C) (Oral)   Resp 16   Ht 5\' 7"  (1.702 m)   Wt 152 lb (68.9 kg)   SpO2 98%   BMI 23.81 kg/m   BP Readings from Last 3 Encounters:  06/29/16 (!) 168/64  02/13/16 (!) 150/68  01/31/16 (!) 165/59    Wt Readings from Last 3 Encounters:  06/29/16 152 lb (68.9 kg)  02/13/16 153 lb (69.4 kg)  01/31/16 148 lb 9.6 oz (67.4 kg)    Physical Exam  Constitutional: He is oriented to person, place, and time. He appears well-developed. No distress.  NAD  HENT:  Mouth/Throat: Oropharynx is clear and moist.  Eyes: Conjunctivae are normal. Pupils are equal, round, and reactive to light.  Neck: Normal range of motion. No JVD present. No thyromegaly present.  Cardiovascular: Normal rate, regular rhythm, normal heart sounds and intact distal pulses.  Exam reveals no gallop and no friction rub.   No murmur heard. Pulmonary/Chest: Effort normal and breath sounds normal. No respiratory distress. He has no wheezes. He has no rales. He exhibits no tenderness.  Abdominal: Soft. Bowel sounds are normal. He exhibits no distension and no mass. There is no tenderness. There is no rebound and no guarding.  Musculoskeletal: Normal range of motion. He exhibits no edema or tenderness.  Lymphadenopathy:    He has no cervical adenopathy.  Neurological: He is alert and  oriented to person, place, and time. He has normal reflexes. No cranial nerve deficit. He exhibits normal muscle tone. He displays a negative Romberg sign. Coordination and gait normal.  Skin: Skin is warm and dry. Rash noted.  Psychiatric: He has a normal mood and affect. His behavior is normal. Judgment and thought content normal.  T pedis rash  Lab Results  Component Value Date   WBC 5.2 12/24/2015   HGB 9.8 (L) 12/24/2015   HCT 29.6 (L) 12/24/2015   PLT 173 12/24/2015   GLUCOSE 85 12/25/2015    CHOL 160 10/14/2014   TRIG 92.0 10/14/2014   HDL 52.70 10/14/2014   LDLDIRECT 136.2 09/28/2009   LDLCALC 89 10/14/2014   ALT 13 (L) 12/23/2015   AST 23 12/23/2015   NA 141 12/25/2015   K 3.9 12/25/2015   CL 117 (H) 12/25/2015   CREATININE 0.90 12/25/2015   BUN 23 (H) 12/25/2015   CO2 20 (L) 12/25/2015   TSH 1.34 12/22/2015   PSA 0.38 10/14/2014   INR 1.1 ratio (H) 10/06/2009   HGBA1C 5.6 12/09/2013    US Renal  Result Date: 12/24/2015 CLINICAL DATA:  Back pain, renal failure EXAM: RENAL / URINARY TRACT ULTRASOUND COMPLETE COMPARISON:  08/31/2015 FINDINGS: Right Kidney: Length: 11 cm. Normal echogenicity. No hydronephrosis. A cyst in upper pole measures 3.2 x 2.6 cm. A cyst in lower pole measures 1.5 x 1.2 cm. Left Kidney: Length: 11.5 cm. Normal echogenicity. No hydronephrosis. Cyst in lower pole measures 1.9 by 1.2 cm. Second left renal cyst measures 8 x 7 mm. Bladder: Appears normal for degree of bladder distention. Bilateral ureteral jets are visualized. IMPRESSION: 1. No hydronephrosis. Bilateral renal cysts. Unremarkable urinary bladder. Electronically Signed   By: Lahoma Crocker M.D.   On: 12/24/2015 12:53    Assessment & Plan:   [name] [name] was seen today for medicare wellness.  Diagnoses and all orders for this visit:  Screening for prostate cancer   I am having Mr. Rea maintain his Vitamin D3, nitroGLYCERIN, TRAVATAN Z, rosuvastatin, cycloSPORINE, ezetimibe, clopidogrel, loratadine, amLODipine, and ibuprofen.  Meds ordered this encounter  Medications  . ibuprofen (ADVIL,MOTRIN) 200 MG tablet    Sig: Take 200 mg by mouth every 6 (six) hours as needed.     Follow-up: No Follow-up on file.  Walker Kehr, MD

## 2016-06-29 NOTE — Assessment & Plan Note (Signed)
Lamisil cream between the toes

## 2016-06-29 NOTE — Patient Instructions (Addendum)
Continue to eat heart healthy diet (full of fruits, vegetables, whole grains, lean protein, water--limit salt, fat, and sugar intake) and increase physical activity as tolerated.  Start doing brain stimulating activities (puzzles, reading, adult coloring books, staying active) to keep memory sharp.   Dr. Alain Marion stated to get Lamisil to treat fungus on feet, keep feet clean and dry. [name] , Thank you for taking time to come for your Medicare Wellness Visit. I appreciate your ongoing commitment to your health goals. Please review the following plan we discussed and let me know if I can assist you in the future.   These are the goals we discussed: Goals    None      This is a list of the screening recommended for you and due dates:  Health Maintenance  Topic Date Due  . Complete foot exam   11/29/1944  . Eye exam for diabetics  11/29/1944  . Urine Protein Check  11/29/1944  . Hemoglobin A1C  06/11/2014  . Flu Shot  07/14/2017*  . Tetanus Vaccine  10/04/2019  . Pneumonia vaccines  Completed  *Topic was postponed. The date shown is not the original due date.   It is important to avoid accidents which may result in broken bones.  Here are a few ideas on how to make your home safer so you will be less likely to trip or fall.  1. Use nonskid mats or non slip strips in your shower or tub, on your bathroom floor and around sinks.  If you know that you have spilled water, wipe it up! 2. In the bathroom, it is important to have properly installed grab bars on the walls or on the edge of the tub.  Towel racks are NOT strong enough for you to hold onto or to pull on for support. 3. Stairs and hallways should have enough light.  Add lamps or night lights if you need ore light. 4. It is good to have handrails on both sides of the stairs if possible.  Always fix broken handrails right away. 5. It is important to see the edges of steps.  Paint the edges of outdoor steps white so you can see them  better.  Put colored tape on the edge of inside steps. 6. Throw-rugs are dangerous because they can slide.  Removing the rugs is the best idea, but if they must stay, add adhesive carpet tape to prevent slipping. 7. Do not keep things on stairs or in the halls.  Remove small furniture that blocks the halls as it may cause you to trip.  Keep telephone and electrical cords out of the way where you walk. 8. Always were sturdy, rubber-soled shoes for good support.  Never wear just socks, especially on the stairs.  Socks may cause you to slip or fall.  Do not wear full-length housecoats as you can easily trip on the bottom.  9. Place the things you use the most on the shelves that are the easiest to reach.  If you use a stepstool, make sure it is in good condition.  If you feel unsteady, DO NOT climb, ask for help. 10. If a health professional advises you to use a cane or walker, do not be ashamed.  These items can keep you from falling and breaking your bones.    Fall Prevention in the Home Falls can cause injuries. They can happen to people of all ages. There are many things you can do to make your home safe  and to help prevent falls. What can I do on the outside of my home?  Regularly fix the edges of walkways and driveways and fix any cracks.  Remove anything that might make you trip as you walk through a door, such as a raised step or threshold.  Trim any bushes or trees on the path to your home.  Use bright outdoor lighting.  Clear any walking paths of anything that might make someone trip, such as rocks or tools.  Regularly check to see if handrails are loose or broken. Make sure that both sides of any steps have handrails.  Any raised decks and porches should have guardrails on the edges.  Have any leaves, snow, or ice cleared regularly.  Use sand or salt on walking paths during winter.  Clean up any spills in your garage right away. This includes oil or grease spills. What can I  do in the bathroom?  Use night lights.  Install grab bars by the toilet and in the tub and shower. Do not use towel bars as grab bars.  Use non-skid mats or decals in the tub or shower.  If you need to sit down in the shower, use a plastic, non-slip stool.  Keep the floor dry. Clean up any water that spills on the floor as soon as it happens.  Remove soap buildup in the tub or shower regularly.  Attach bath mats securely with double-sided non-slip rug tape.  Do not have throw rugs and other things on the floor that can make you trip. What can I do in the bedroom?  Use night lights.  Make sure that you have a light by your bed that is easy to reach.  Do not use any sheets or blankets that are too big for your bed. They should not hang down onto the floor.  Have a firm chair that has side arms. You can use this for support while you get dressed.  Do not have throw rugs and other things on the floor that can make you trip. What can I do in the kitchen?  Clean up any spills right away.  Avoid walking on wet floors.  Keep items that you use a lot in easy-to-reach places.  If you need to reach something above you, use a strong step stool that has a grab bar.  Keep electrical cords out of the way.  Do not use floor polish or wax that makes floors slippery. If you must use wax, use non-skid floor wax.  Do not have throw rugs and other things on the floor that can make you trip. What can I do with my stairs?  Do not leave any items on the stairs.  Make sure that there are handrails on both sides of the stairs and use them. Fix handrails that are broken or loose. Make sure that handrails are as long as the stairways.  Check any carpeting to make sure that it is firmly attached to the stairs. Fix any carpet that is loose or worn.  Avoid having throw rugs at the top or bottom of the stairs. If you do have throw rugs, attach them to the floor with carpet tape.  Make sure that  you have a light switch at the top of the stairs and the bottom of the stairs. If you do not have them, ask someone to add them for you. What else can I do to help prevent falls?  Wear shoes that:  Do not have high  heels.  Have rubber bottoms.  Are comfortable and fit you well.  Are closed at the toe. Do not wear sandals.  If you use a stepladder:  Make sure that it is fully opened. Do not climb a closed stepladder.  Make sure that both sides of the stepladder are locked into place.  Ask someone to hold it for you, if possible.  Clearly mark and make sure that you can see:  Any grab bars or handrails.  First and last steps.  Where the edge of each step is.  Use tools that help you move around (mobility aids) if they are needed. These include:  Canes.  Walkers.  Scooters.  Crutches.  Turn on the lights when you go into a dark area. Replace any light bulbs as soon as they burn out.  Set up your furniture so you have a clear path. Avoid moving your furniture around.  If any of your floors are uneven, fix them.  If there are any pets around you, be aware of where they are.  Review your medicines with your doctor. Some medicines can make you feel dizzy. This can increase your chance of falling. Ask your doctor what other things that you can do to help prevent falls. This information is not intended to replace advice given to you by your health care provider. Make sure you discuss any questions you have with your health care provider. Document Released: 01/27/2009 Document Revised: 09/08/2015 Document Reviewed: 05/07/2014 Elsevier Interactive Patient Education  2017 [employer]. 11.

## 2016-06-29 NOTE — Progress Notes (Signed)
Pre-visit discussion using our clinic review tool. No additional management support is needed unless otherwise documented below in the visit note.  

## 2016-06-29 NOTE — Assessment & Plan Note (Signed)
Labs

## 2016-06-29 NOTE — Assessment & Plan Note (Signed)
F/u w/Hem-Onc 

## 2016-06-29 NOTE — Assessment & Plan Note (Signed)
Zetia, Crestor, Plavix, ASA

## 2016-06-29 NOTE — Assessment & Plan Note (Signed)
Crestor, Zetia, ASA, Plavix Labs

## 2016-10-22 ENCOUNTER — Other Ambulatory Visit: Payer: Self-pay | Admitting: Internal Medicine

## 2016-12-23 ENCOUNTER — Other Ambulatory Visit: Payer: Self-pay | Admitting: Internal Medicine

## 2016-12-26 ENCOUNTER — Ambulatory Visit (INDEPENDENT_AMBULATORY_CARE_PROVIDER_SITE_OTHER): Payer: Medicare Other | Admitting: Podiatry

## 2016-12-26 ENCOUNTER — Encounter: Payer: Self-pay | Admitting: Podiatry

## 2016-12-26 DIAGNOSIS — M79676 Pain in unspecified toe(s): Secondary | ICD-10-CM | POA: Diagnosis not present

## 2016-12-26 DIAGNOSIS — B351 Tinea unguium: Secondary | ICD-10-CM | POA: Diagnosis not present

## 2016-12-26 NOTE — Progress Notes (Signed)
   Subjective:    Patient ID: [name] [name], male    DOB: Nov 27, 1932, 83 y.o.   MRN: [id number]  HPI    Review of Systems  Cardiovascular: Positive for chest pain, palpitations and leg swelling.       Calf pain with walking  Gastrointestinal: Positive for constipation.  Endocrine: Positive for polyuria.  Musculoskeletal: Positive for back pain.  Psychiatric/Behavioral: Positive for confusion.  All other systems reviewed and are negative.      Objective:   Physical Exam        Assessment & Plan:

## 2016-12-28 NOTE — Progress Notes (Signed)
   SUBJECTIVE Patient presents to office today complaining of elongated, thickened nails. Pain while ambulating in shoes. He also reports a possible bunion to the medial left great toe that is intermittently painful. He states he is also concerned for a possible ingrown left great toenail. He reports the medial border of the nail appears to be growing into the skin. Patient is unable to trim their own nails.   Past Medical History:  Diagnosis Date  . Anxiety   . Arthritis   . Carotid artery disease ([place])   . Coronary artery disease    sees Dr. Verl Blalock  . Depression   . DJD (degenerative joint disease)    right hip  . GERD (gastroesophageal reflux disease)    hx of  . Glaucoma   . Glucose intolerance (impaired glucose tolerance)   . Headache(784.0)    hx of migraines  . Herpes zoster 2009   r. scalp and opth  . Hx of colonic polyps 2004   hyperplastic  . Hyperlipidemia   . Hypertension    sees Dr. Adelene Amas  . Internal and external hemorrhoids without complication   . Low back pain    facet arthropathy  . Lumbago   . Peripheral vascular disease ([place])   . Pneumonia 2009   rll with a small effusion  . Postherpetic neuralgia 2009    OBJECTIVE General Patient is awake, alert, and oriented x 3 and in no acute distress. Derm Skin is dry and supple bilateral. Negative open lesions or macerations. Remaining integument unremarkable. Nails are tender, long, thickened and dystrophic with subungual debris, consistent with onychomycosis, 1-5 bilateral. No signs of infection noted. Vasc  DP and PT pedal pulses palpable bilaterally. Temperature gradient within normal limits.  Neuro Epicritic and protective threshold sensation diminished bilaterally.  Musculoskeletal Exam No symptomatic pedal deformities noted bilateral. Muscular strength within normal limits.  ASSESSMENT 1. Onychodystrophic nails 1-5 bilateral with hyperkeratosis of nails.  2. Onychomycosis of nail due to dermatophyte  bilateral 3. Pain in foot bilateral  PLAN OF CARE 1. Patient evaluated today.  2. Instructed to maintain good pedal hygiene and foot care.  3. Mechanical debridement of nails 1-5 bilaterally performed using a nail nipper. Filed with dremel without incident.  4. Recommended Formula 3 Antifungal treatment. 5. Return to clinic in 3 mos.    Edrick Kins, DPM [facility]  Dr. Edrick Kins, [address]                                        [place], [place] [address]                Office [phone]  Fax [phone]

## 2017-01-16 ENCOUNTER — Ambulatory Visit (INDEPENDENT_AMBULATORY_CARE_PROVIDER_SITE_OTHER): Payer: Medicare Other | Admitting: Internal Medicine

## 2017-01-16 ENCOUNTER — Other Ambulatory Visit (INDEPENDENT_AMBULATORY_CARE_PROVIDER_SITE_OTHER): Payer: Medicare Other

## 2017-01-16 ENCOUNTER — Encounter: Payer: Self-pay | Admitting: Internal Medicine

## 2017-01-16 ENCOUNTER — Ambulatory Visit (INDEPENDENT_AMBULATORY_CARE_PROVIDER_SITE_OTHER)
Admission: RE | Admit: 2017-01-16 | Discharge: 2017-01-16 | Disposition: A | Discharge status: Home / Self Care | Payer: Medicare Other | Source: Ambulatory Visit | Attending: Internal Medicine | Admitting: Internal Medicine

## 2017-01-16 VITALS — BP 162/80 | HR 76 | Temp 100.0°F | Ht 67.0 in | Wt 147.0 lb

## 2017-01-16 DIAGNOSIS — R040 Epistaxis: Secondary | ICD-10-CM | POA: Diagnosis not present

## 2017-01-16 DIAGNOSIS — R634 Abnormal weight loss: Secondary | ICD-10-CM | POA: Diagnosis not present

## 2017-01-16 DIAGNOSIS — R05 Cough: Secondary | ICD-10-CM | POA: Diagnosis not present

## 2017-01-16 DIAGNOSIS — N179 Acute kidney failure, unspecified: Secondary | ICD-10-CM

## 2017-01-16 DIAGNOSIS — R059 Cough, unspecified: Secondary | ICD-10-CM

## 2017-01-16 DIAGNOSIS — Z23 Encounter for immunization: Secondary | ICD-10-CM

## 2017-01-16 LAB — CBC WITH DIFFERENTIAL/PLATELET
BASOS PCT: 0.2 % (ref 0.0–3.0)
Basophils Absolute: 0 10*3/uL (ref 0.0–0.1)
EOS PCT: 1.9 % (ref 0.0–5.0)
Eosinophils Absolute: 0.2 10*3/uL (ref 0.0–0.7)
HEMATOCRIT: 38.4 % — AB (ref 39.0–52.0)
HEMOGLOBIN: 12.5 g/dL — AB (ref 13.0–17.0)
LYMPHS PCT: 16.1 % (ref 12.0–46.0)
Lymphs Abs: 1.4 10*3/uL (ref 0.7–4.0)
MCHC: 32.5 g/dL (ref 30.0–36.0)
MCV: 80.8 fl (ref 78.0–100.0)
MONOS PCT: 8.8 % (ref 3.0–12.0)
Monocytes Absolute: 0.8 10*3/uL (ref 0.1–1.0)
Neutro Abs: 6.2 10*3/uL (ref 1.4–7.7)
Neutrophils Relative %: 73 % (ref 43.0–77.0)
Platelets: 206 10*3/uL (ref 150.0–400.0)
RBC: 4.75 Mil/uL (ref 4.22–5.81)
RDW: 16.8 % — AB (ref 11.5–15.5)
WBC: 8.5 10*3/uL (ref 4.0–10.5)

## 2017-01-16 LAB — HEPATIC FUNCTION PANEL
ALBUMIN: 4 g/dL (ref 3.5–5.2)
ALK PHOS: 81 U/L (ref 39–117)
ALT: 12 U/L (ref 0–53)
AST: 23 U/L (ref 0–37)
Bilirubin, Direct: 0.3 mg/dL (ref 0.0–0.3)
TOTAL PROTEIN: 7 g/dL (ref 6.0–8.3)
Total Bilirubin: 1.3 mg/dL — ABNORMAL HIGH (ref 0.2–1.2)

## 2017-01-16 LAB — BASIC METABOLIC PANEL
BUN: 14 mg/dL (ref 6–23)
CO2: 27 mEq/L (ref 19–32)
Calcium: 9.4 mg/dL (ref 8.4–10.5)
Chloride: 101 mEq/L (ref 96–112)
Creatinine, Ser: 1.1 mg/dL (ref 0.40–1.50)
GFR: 82.39 mL/min (ref >60.00)
GLUCOSE: 98 mg/dL (ref 70–99)
POTASSIUM: 3.9 meq/L (ref 3.5–5.1)
Sodium: 137 mEq/L (ref 135–145)

## 2017-01-16 LAB — TSH: TSH: 1.79 u[IU]/mL (ref 0.35–4.50)

## 2017-01-16 MED ORDER — ASPIRIN 81 MG PO TABS: 81.0000 mg | ORAL_TABLET | Freq: Every day | ORAL | 3 refills | Status: DC

## 2017-01-16 MED ORDER — PROMETHAZINE-CODEINE 6.25-10 MG/5ML PO SYRP: 5.0000 mL | ORAL_SOLUTION | ORAL | 0 refills | Status: DC | PRN

## 2017-01-16 NOTE — Assessment & Plan Note (Signed)
TSH 

## 2017-01-16 NOTE — Progress Notes (Signed)
Subjective:  Patient ID: [name] [name], male    DOB: 10-11-1932  Age: 83 y.o. MRN: [id number]  CC: No chief complaint on file.   HPI [name] [name] presents for HTN, CAD, PVD f/u C/o nose bleeds, bruising. The pt stopped Plavix >1 mo ago. Not taking ASA C/o cough  Outpatient Medications Prior to Visit  Medication Sig Dispense Refill  . amLODipine (NORVASC) 5 MG tablet Take 1 tablet (5 mg total) by mouth daily. 90 tablet 2  . Cholecalciferol (VITAMIN D3) 1000 UNITS CAPS Take 1 capsule by mouth daily.      . rosuvastatin (CRESTOR) 20 MG tablet TAKE 1 TABLET DAILY 90 tablet 2  . cycloSPORINE (RESTASIS) 0.05 % ophthalmic emulsion Place 1 drop into both eyes 2 (two) times daily.    Marland Kitchen ezetimibe (ZETIA) 10 MG tablet Take 1 tablet (10 mg total) by mouth daily. Patient needs office visit before refills will be given (Patient not taking: Reported on 01/16/2017) 90 tablet 0  . fluticasone (FLONASE) 50 MCG/ACT nasal spray     . ibuprofen (ADVIL,MOTRIN) 200 MG tablet Take 200 mg by mouth every 6 (six) hours as needed.    . loratadine (CLARITIN) 10 MG tablet Take 1 tablet (10 mg total) by mouth daily. (Patient not taking: Reported on 01/16/2017) 100 tablet 3  . nitroGLYCERIN (NITROSTAT) 0.4 MG SL tablet Place 1 tablet (0.4 mg total) under the tongue every 5 (five) minutes as needed. For chest pain (Patient not taking: Reported on 01/16/2017) 20 tablet 3  . TRAVATAN Z 0.004 % SOLN ophthalmic solution Place 1 drop into both eyes daily.    . clopidogrel (PLAVIX) 75 MG tablet Take 1 tablet (75 mg total) by mouth daily. Patient needs office visit before refills will be given (Patient not taking: Reported on 01/16/2017) 90 tablet 0   No facility-administered medications prior to visit.     ROS Review of Systems  Constitutional: Positive for fatigue. Negative for appetite change and unexpected weight change.  HENT: Positive for nosebleeds. Negative for congestion, sneezing, sore throat and trouble swallowing.    Eyes: Negative for itching and visual disturbance.  Respiratory: Positive for cough.   Cardiovascular: Negative for chest pain, palpitations and leg swelling.  Gastrointestinal: Negative for abdominal distention, blood in stool, diarrhea and nausea.  Genitourinary: Negative for frequency and hematuria.  Musculoskeletal: Negative for back pain, gait problem, joint swelling and neck pain.  Skin: Negative for pallor and rash.  Neurological: Negative for dizziness, tremors, speech difficulty and weakness.  Psychiatric/Behavioral: Negative for agitation, dysphoric mood and sleep disturbance. The patient is not nervous/anxious.     Objective:  BP (!) 162/80 (BP Location: Right Arm, Patient Position: Sitting, Cuff Size: Normal)   Pulse 76   Temp 100 F (37.8 C) (Oral)   Ht 5\' 7"  (1.702 m)   Wt 147 lb (66.7 kg)   SpO2 98%   BMI 23.02 kg/m   BP Readings from Last 3 Encounters:  01/16/17 (!) 162/80  06/29/16 (!) 168/64  02/13/16 (!) 150/68    Wt Readings from Last 3 Encounters:  01/16/17 147 lb (66.7 kg)  06/29/16 152 lb (68.9 kg)  02/13/16 153 lb (69.4 kg)    Physical Exam  Constitutional: He is oriented to person, place, and time. He appears well-developed. No distress.  NAD  HENT:  Mouth/Throat: Oropharynx is clear and moist.  Eyes: Pupils are equal, round, and reactive to light. Conjunctivae are normal.  Neck: Normal range of motion. No JVD  present. No thyromegaly present.  Cardiovascular: Normal rate, regular rhythm, normal heart sounds and intact distal pulses.  Exam reveals no gallop and no friction rub.   No murmur heard. Pulmonary/Chest: Effort normal. No respiratory distress. He has wheezes. He has no rales. He exhibits no tenderness.  Abdominal: Soft. Bowel sounds are normal. He exhibits no distension and no mass. There is no tenderness. There is no rebound and no guarding.  Musculoskeletal: Normal range of motion. He exhibits no edema or tenderness.    Lymphadenopathy:    He has no cervical adenopathy.  Neurological: He is alert and oriented to person, place, and time. He has normal reflexes. No cranial nerve deficit. He exhibits normal muscle tone. He displays a negative Romberg sign. Coordination and gait normal.  Skin: Skin is warm and dry. No rash noted.  Psychiatric: He has a normal mood and affect. His behavior is normal. Judgment and thought content normal.    Lab Results  Component Value Date   WBC 7.1 06/29/2016   HGB 11.0 (L) 06/29/2016   HCT 34.6 (L) 06/29/2016   PLT 233.0 06/29/2016   GLUCOSE 87 06/29/2016   CHOL 160 10/14/2014   TRIG 92.0 10/14/2014   HDL 52.70 10/14/2014   LDLDIRECT 136.2 09/28/2009   LDLCALC 89 10/14/2014   ALT 18 06/29/2016   AST 26 06/29/2016   NA 136 06/29/2016   K 4.1 06/29/2016   CL 104 06/29/2016   CREATININE 1.01 06/29/2016   BUN 18 06/29/2016   CO2 26 06/29/2016   TSH 0.96 06/29/2016   PSA 0.33 06/29/2016   INR 1.1 ratio (H) 10/06/2009   HGBA1C 5.6 12/09/2013    US Renal  Result Date: 12/24/2015 CLINICAL DATA:  Back pain, renal failure EXAM: RENAL / URINARY TRACT ULTRASOUND COMPLETE COMPARISON:  08/31/2015 FINDINGS: Right Kidney: Length: 11 cm. Normal echogenicity. No hydronephrosis. A cyst in upper pole measures 3.2 x 2.6 cm. A cyst in lower pole measures 1.5 x 1.2 cm. Left Kidney: Length: 11.5 cm. Normal echogenicity. No hydronephrosis. Cyst in lower pole measures 1.9 by 1.2 cm. Second left renal cyst measures 8 x 7 mm. Bladder: Appears normal for degree of bladder distention. Bilateral ureteral jets are visualized. IMPRESSION: 1. No hydronephrosis. Bilateral renal cysts. Unremarkable urinary bladder. Electronically Signed   By: Lahoma Crocker M.D.   On: 12/24/2015 12:53    Assessment & Plan:   There are no diagnoses linked to this encounter. I have discontinued [name] [name]'s clopidogrel. I am also having him maintain his Vitamin D3, nitroGLYCERIN, TRAVATAN Z, cycloSPORINE, loratadine,  amLODipine, ibuprofen, rosuvastatin, ezetimibe, and fluticasone.  No orders of the defined types were placed in this encounter.    Follow-up: No Follow-up on file.  Walker Kehr, MD

## 2017-01-16 NOTE — Patient Instructions (Signed)
MC well w/Jill yearly

## 2017-01-16 NOTE — Assessment & Plan Note (Signed)
Plavix - pt stopped it. Start baby ASA

## 2017-01-16 NOTE — Assessment & Plan Note (Signed)
Labs

## 2017-01-16 NOTE — Assessment & Plan Note (Signed)
CXR

## 2017-01-23 ENCOUNTER — Other Ambulatory Visit: Payer: Medicare Other

## 2017-01-24 NOTE — Addendum Note (Signed)
Addended by: Karren Cobble on: 01/24/2017 01:55 PM   Modules accepted: Orders

## 2017-01-25 ENCOUNTER — Telehealth: Payer: Self-pay | Admitting: Hematology and Oncology

## 2017-01-25 NOTE — Telephone Encounter (Signed)
Left message for patient regarding upcoming appointment updates. Appointment moved from 10/17 to 10/16.

## 2017-01-29 ENCOUNTER — Ambulatory Visit: Payer: Medicare Other | Admitting: Hematology and Oncology

## 2017-01-29 NOTE — Assessment & Plan Note (Deleted)
Elevated IgG lambda monoclonal protein, could not be measured Found on routine testing for dizziness and lightheadedness and weight loss.  Lab review:01/16/2017: Hemoglobin 12.5, rest of CBC normal,albumin 4, creatinine normal at 1.1,TSH 1.79 SPEP is pending  Return to clinic in 1 year with labs done ahead of time and follow up

## 2017-01-30 ENCOUNTER — Ambulatory Visit: Payer: Medicare Other | Admitting: Hematology and Oncology

## 2017-02-25 ENCOUNTER — Other Ambulatory Visit: Payer: Self-pay | Admitting: Internal Medicine

## 2017-03-27 ENCOUNTER — Encounter: Payer: Self-pay | Admitting: Podiatry

## 2017-03-27 ENCOUNTER — Ambulatory Visit (INDEPENDENT_AMBULATORY_CARE_PROVIDER_SITE_OTHER): Payer: Medicare Other | Admitting: Podiatry

## 2017-03-27 DIAGNOSIS — M79676 Pain in unspecified toe(s): Secondary | ICD-10-CM | POA: Diagnosis not present

## 2017-03-27 DIAGNOSIS — B351 Tinea unguium: Secondary | ICD-10-CM

## 2017-03-27 NOTE — Progress Notes (Signed)
Complaint:  Visit Type: Patient returns to my office for continued preventative foot care services. Complaint: Patient states" my nails have grown long and thick and become painful to walk and wear shoes" Patient has been diagnosed with peripheral arterial disease. The patient presents for preventative foot care services. No changes to ROS  Podiatric Exam: Vascular: dorsalis pedis and posterior tibial pulses are palpable bilateral. Capillary return is immediate. Temperature gradient is WNL. Skin turgor WNL  Sensorium: Normal Semmes Weinstein monofilament test. Normal tactile sensation bilaterally. Nail Exam: Pt has thick disfigured discolored nails with subungual debris noted bilateral entire nail hallux through fifth toenails Ulcer Exam: There is no evidence of ulcer or pre-ulcerative changes or infection. Orthopedic Exam: Muscle tone and strength are WNL. No limitations in general ROM. No crepitus or effusions noted. Foot type and digits show no abnormalities. Bony prominences are unremarkable. Skin: No Porokeratosis. No infection or ulcers  Diagnosis:  Onychomycosis, , Pain in right toe, pain in left toes  Treatment & Plan Procedures and Treatment: Consent by patient was obtained for treatment procedures.   Debridement of mycotic and hypertrophic toenails, 1 through 5 bilateral and clearing of subungual debris. No ulceration, no infection noted.  Return Visit-Office Procedure: Patient instructed to return to the office for a follow up visit 3 months for continued evaluation and treatment.    [name] [name] DPM 

## 2017-04-02 ENCOUNTER — Other Ambulatory Visit: Payer: Self-pay | Admitting: Internal Medicine

## 2017-04-10 IMAGING — DX DG CHEST 2V
2 series · 2 of 2 positions shown · non-contrast
Comparison: 02/06/2012

CLINICAL DATA: Fatigue and unexplained weight loss.

EXAM:
CHEST  2 VIEW

[chest pa]
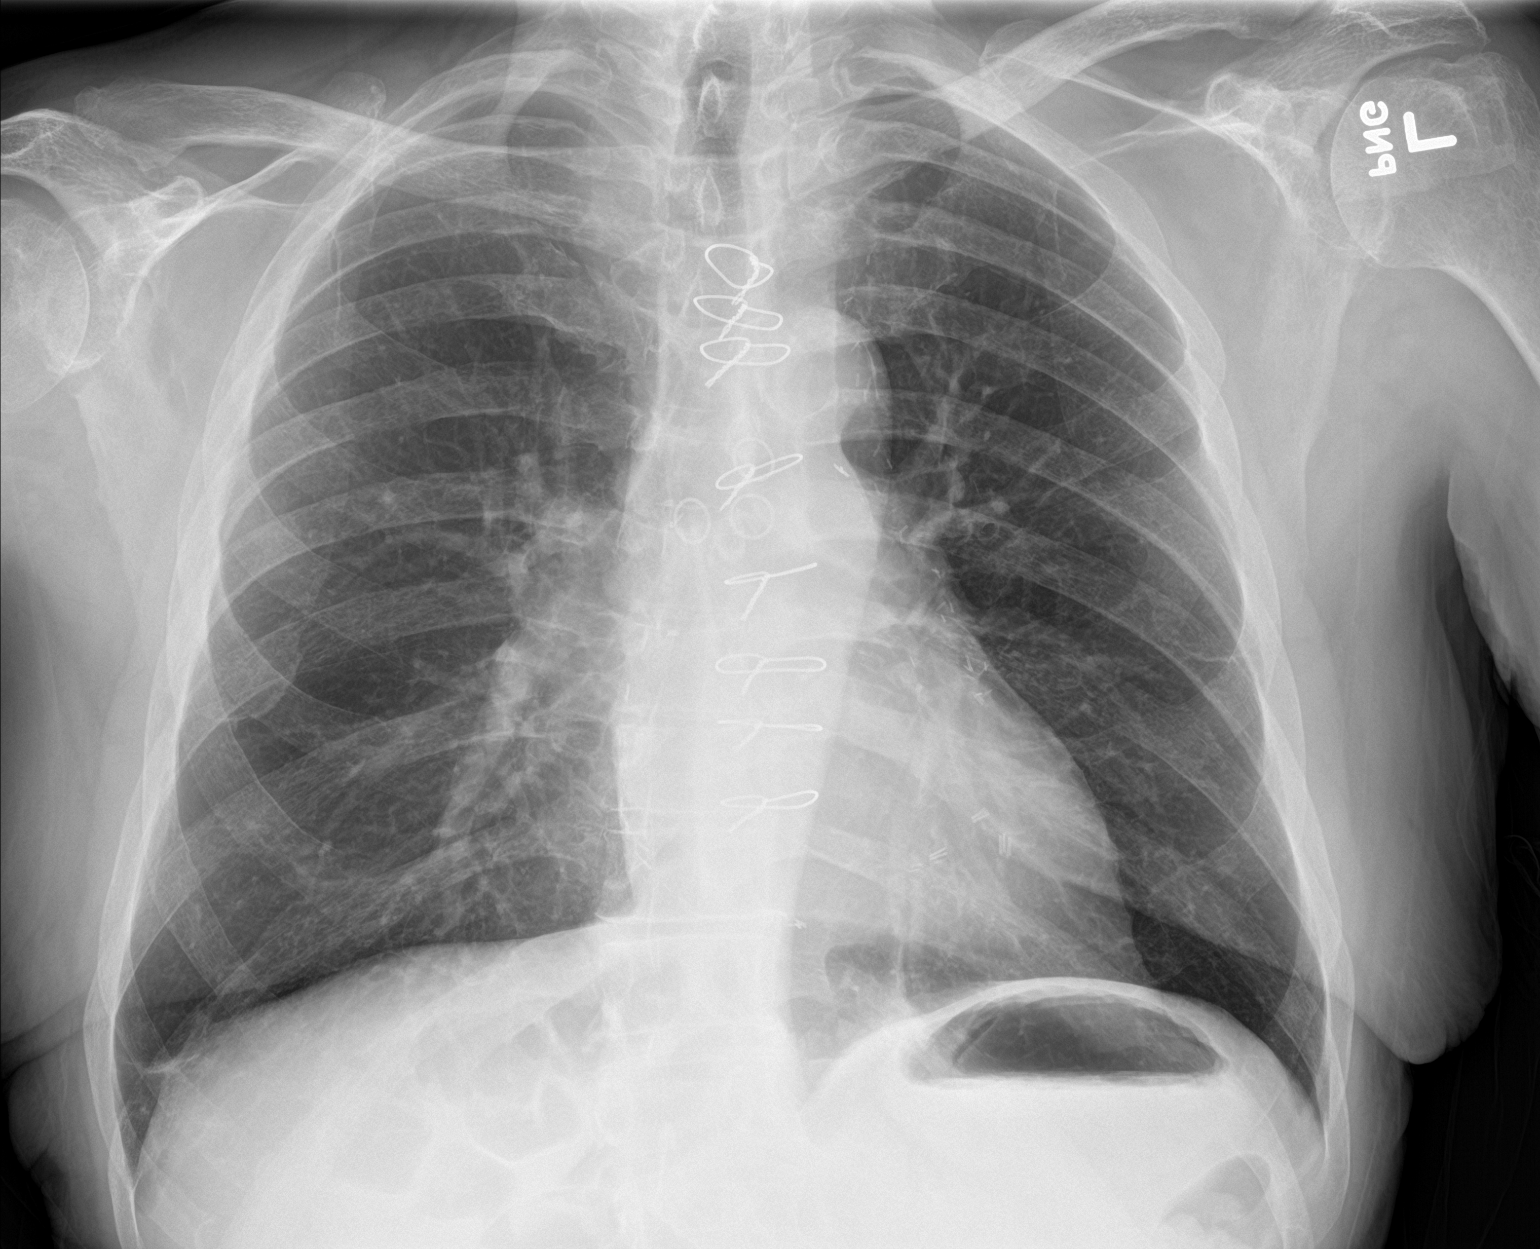

[chest lat]
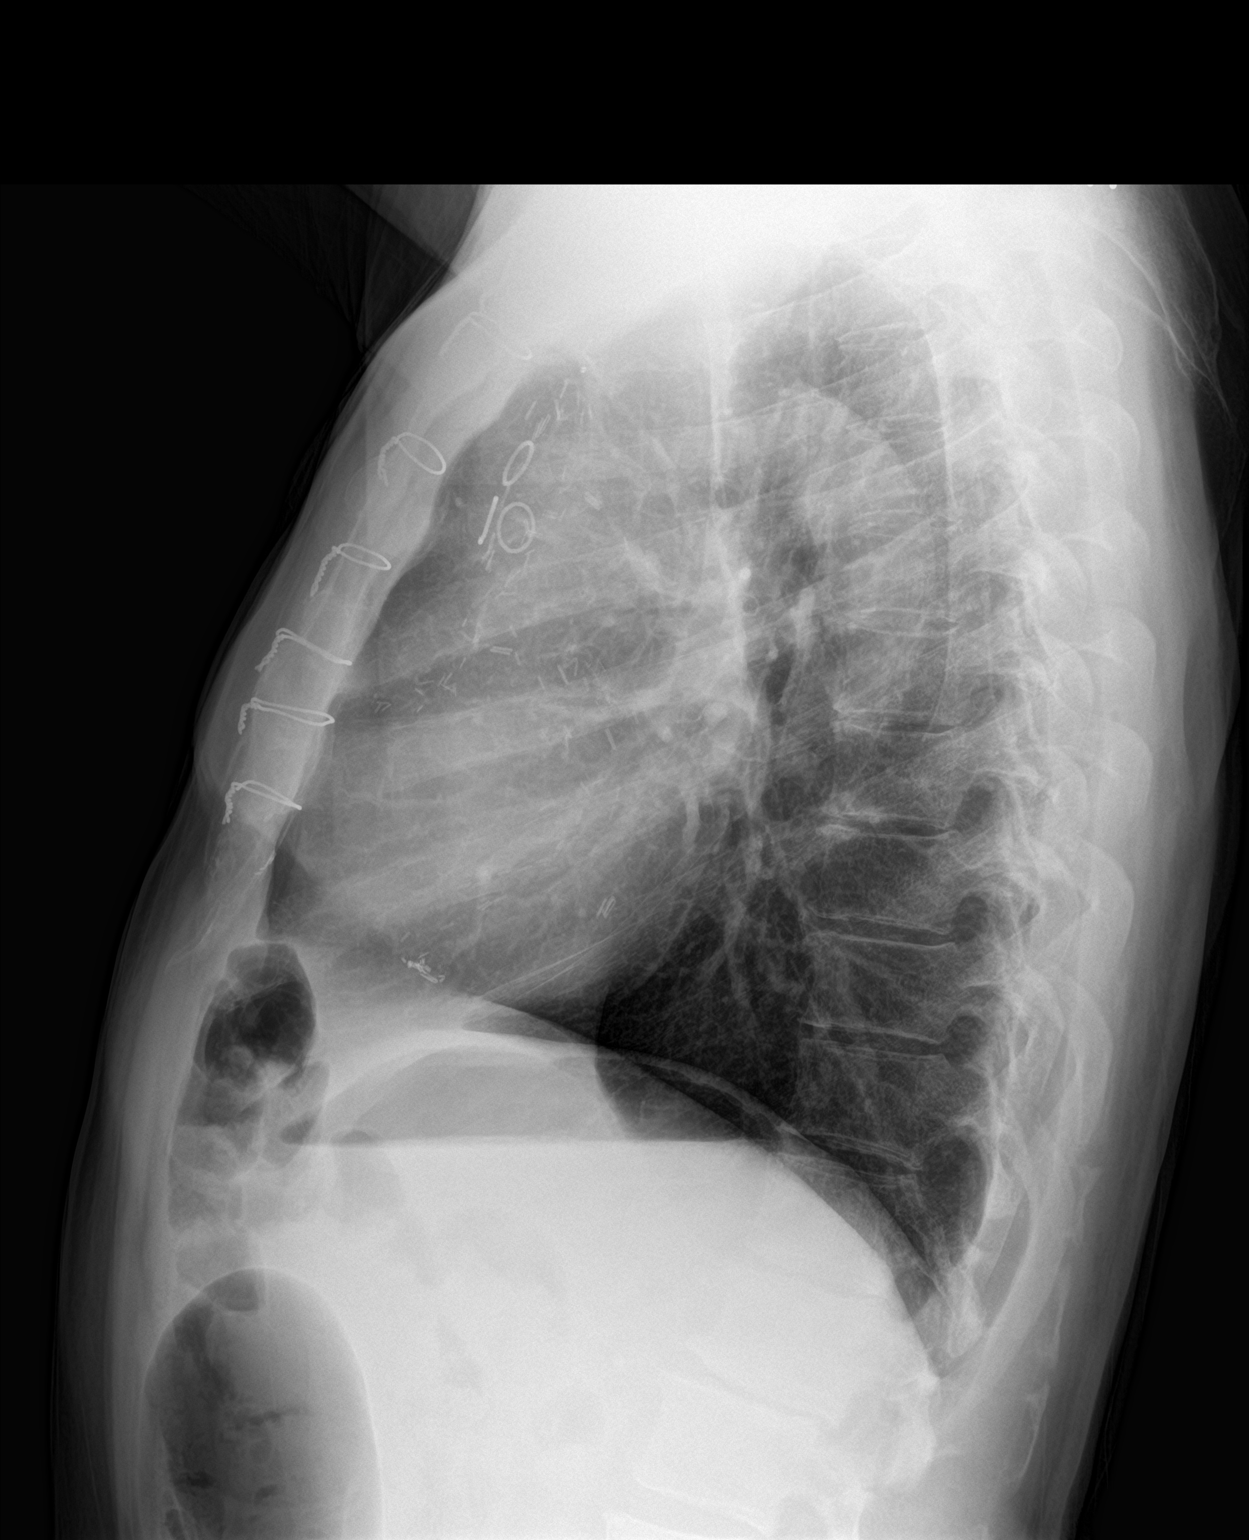

[2 of 2 positions shown; findings below may reference images not displayed]

FINDINGS: The heart size and pulmonary vascularity are normal and the lungs
are clear except for slight scarring at the right lung base
laterally and at the left lung base posterior medially. CABG. No
acute bone abnormality.
IMPRESSION: No acute abnormality.

## 2017-06-26 ENCOUNTER — Encounter: Payer: Self-pay | Admitting: Podiatry

## 2017-06-26 ENCOUNTER — Ambulatory Visit (INDEPENDENT_AMBULATORY_CARE_PROVIDER_SITE_OTHER): Payer: Medicare Other | Admitting: Podiatry

## 2017-06-26 DIAGNOSIS — M79676 Pain in unspecified toe(s): Secondary | ICD-10-CM

## 2017-06-26 DIAGNOSIS — B351 Tinea unguium: Secondary | ICD-10-CM

## 2017-06-26 DIAGNOSIS — D689 Coagulation defect, unspecified: Secondary | ICD-10-CM

## 2017-06-26 NOTE — Progress Notes (Signed)
Complaint:  Visit Type: Patient returns to my office for continued preventative foot care services. Complaint: Patient states" my nails have grown long and thick and become painful to walk and wear shoes" Patient has been diagnosed with peripheral arterial disease.. The patient presents for preventative foot care services. No changes to ROS.  Patient is taking plavix.  Podiatric Exam: Vascular: dorsalis pedis and posterior tibial pulses are palpable bilateral. Capillary return is immediate. Temperature gradient is WNL. Skin turgor WNL  Sensorium: Normal Semmes Weinstein monofilament test. Normal tactile sensation bilaterally. Nail Exam: Pt has thick disfigured discolored nails with subungual debris noted bilateral entire nail hallux through fifth toenails Ulcer Exam: There is no evidence of ulcer or pre-ulcerative changes or infection. Orthopedic Exam: Muscle tone and strength are WNL. No limitations in general ROM. No crepitus or effusions noted. Foot type and digits show no abnormalities. Bony prominences are unremarkable. Skin: No Porokeratosis. No infection or ulcers  Diagnosis:  Onychomycosis, , Pain in right toe, pain in left toes  Treatment & Plan Procedures and Treatment: Consent by patient was obtained for treatment procedures.   Debridement of mycotic and hypertrophic toenails, 1 through 5 bilateral and clearing of subungual debris. No ulceration, no infection noted. Dispense padding. Return Visit-Office Procedure: Patient instructed to return to the office for a follow up visit 3 months for continued evaluation and treatment.    Izabela Ow DPM 

## 2017-06-27 ENCOUNTER — Other Ambulatory Visit: Payer: Self-pay | Admitting: Internal Medicine

## 2017-07-02 ENCOUNTER — Other Ambulatory Visit: Payer: Self-pay | Admitting: Internal Medicine

## 2017-07-12 ENCOUNTER — Ambulatory Visit: Payer: Medicare Other | Admitting: Internal Medicine

## 2017-07-23 ENCOUNTER — Ambulatory Visit (INDEPENDENT_AMBULATORY_CARE_PROVIDER_SITE_OTHER): Payer: Medicare Other | Admitting: Internal Medicine

## 2017-07-23 ENCOUNTER — Other Ambulatory Visit (INDEPENDENT_AMBULATORY_CARE_PROVIDER_SITE_OTHER): Payer: Medicare Other

## 2017-07-23 ENCOUNTER — Encounter: Payer: Self-pay | Admitting: Internal Medicine

## 2017-07-23 VITALS — BP 144/82 | HR 62 | Temp 99.0°F | Ht 67.0 in | Wt 149.0 lb

## 2017-07-23 DIAGNOSIS — D472 Monoclonal gammopathy: Secondary | ICD-10-CM | POA: Diagnosis not present

## 2017-07-23 DIAGNOSIS — I2510 Atherosclerotic heart disease of native coronary artery without angina pectoris: Secondary | ICD-10-CM | POA: Diagnosis not present

## 2017-07-23 DIAGNOSIS — R7309 Other abnormal glucose: Secondary | ICD-10-CM | POA: Diagnosis not present

## 2017-07-23 DIAGNOSIS — N320 Bladder-neck obstruction: Secondary | ICD-10-CM | POA: Diagnosis not present

## 2017-07-23 DIAGNOSIS — I130 Hypertensive heart and chronic kidney disease with heart failure and stage 1 through stage 4 chronic kidney disease, or unspecified chronic kidney disease: Secondary | ICD-10-CM | POA: Diagnosis not present

## 2017-07-23 DIAGNOSIS — M5440 Lumbago with sciatica, unspecified side: Secondary | ICD-10-CM | POA: Diagnosis not present

## 2017-07-23 DIAGNOSIS — E785 Hyperlipidemia, unspecified: Secondary | ICD-10-CM

## 2017-07-23 LAB — HEPATIC FUNCTION PANEL
ALT: 18 U/L (ref 0–53)
AST: 30 U/L (ref 0–37)
Albumin: 4.3 g/dL (ref 3.5–5.2)
Alkaline Phosphatase: 74 U/L (ref 39–117)
BILIRUBIN DIRECT: 0.2 mg/dL (ref 0.0–0.3)
BILIRUBIN TOTAL: 0.8 mg/dL (ref 0.2–1.2)
TOTAL PROTEIN: 6.7 g/dL (ref 6.0–8.3)

## 2017-07-23 LAB — CBC WITH DIFFERENTIAL/PLATELET
BASOS PCT: 0.5 % (ref 0.0–3.0)
Basophils Absolute: 0 10*3/uL (ref 0.0–0.1)
EOS PCT: 3.8 % (ref 0.0–5.0)
Eosinophils Absolute: 0.3 10*3/uL (ref 0.0–0.7)
HEMATOCRIT: 35.2 % — AB (ref 39.0–52.0)
HEMOGLOBIN: 11.4 g/dL — AB (ref 13.0–17.0)
LYMPHS PCT: 21.6 % (ref 12.0–46.0)
Lymphs Abs: 1.5 10*3/uL (ref 0.7–4.0)
MCHC: 32.4 g/dL (ref 30.0–36.0)
MCV: 80.1 fl (ref 78.0–100.0)
Monocytes Absolute: 0.6 10*3/uL (ref 0.1–1.0)
Monocytes Relative: 9 % (ref 3.0–12.0)
Neutro Abs: 4.4 10*3/uL (ref 1.4–7.7)
Neutrophils Relative %: 65.1 % (ref 43.0–77.0)
PLATELETS: 197 10*3/uL (ref 150.0–400.0)
RBC: 4.39 Mil/uL (ref 4.22–5.81)
RDW: 14.7 % (ref 11.5–15.5)
WBC: 6.8 10*3/uL (ref 4.0–10.5)

## 2017-07-23 LAB — TSH: TSH: 1.32 u[IU]/mL (ref 0.35–4.50)

## 2017-07-23 LAB — BASIC METABOLIC PANEL
BUN: 17 mg/dL (ref 6–23)
CALCIUM: 9.4 mg/dL (ref 8.4–10.5)
CO2: 29 mEq/L (ref 19–32)
Chloride: 106 mEq/L (ref 96–112)
Creatinine, Ser: 1.05 mg/dL (ref 0.40–1.50)
GFR: 86.83 mL/min (ref >60.00)
GLUCOSE: 90 mg/dL (ref 70–99)
Potassium: 4.6 mEq/L (ref 3.5–5.1)
Sodium: 140 mEq/L (ref 135–145)

## 2017-07-23 LAB — PSA: PSA: 0.37 ng/mL (ref 0.10–4.00)

## 2017-07-23 NOTE — Progress Notes (Signed)
Subjective:  Patient ID: [name] [name], male    DOB: 06/15/1932  Age: 84 y.o. MRN: [id number]  CC: No chief complaint on file.   HPI [name] [name] presents for MGUS. C/o some lower back pain from the fall 5 mo ago off and on. F/u on HTN, CAD  Outpatient Medications Prior to Visit  Medication Sig Dispense Refill  . amLODipine (NORVASC) 5 MG tablet Take 1 tablet (5 mg total) daily by mouth. 90 tablet 3  . aspirin 81 MG tablet Take 1 tablet (81 mg total) by mouth daily. 100 tablet 3  . Cholecalciferol (VITAMIN D3) 1000 UNITS CAPS Take 1 capsule by mouth daily.      . clopidogrel (PLAVIX) 75 MG tablet Take 1 tablet (75 mg total) by mouth daily. 90 tablet 3  . cycloSPORINE (RESTASIS) 0.05 % ophthalmic emulsion Place 1 drop into both eyes 2 (two) times daily.    Marland Kitchen ezetimibe (ZETIA) 10 MG tablet Take 1 tablet (10 mg total) by mouth daily. 90 tablet 3  . fluticasone (FLONASE) 50 MCG/ACT nasal spray     . ibuprofen (ADVIL,MOTRIN) 200 MG tablet Take 200 mg by mouth every 6 (six) hours as needed.    . nitroGLYCERIN (NITROSTAT) 0.4 MG SL tablet Place 1 tablet (0.4 mg total) under the tongue every 5 (five) minutes as needed. For chest pain 20 tablet 3  . promethazine-codeine (PHENERGAN WITH CODEINE) 6.25-10 MG/5ML syrup Take 5 mLs by mouth every 4 (four) hours as needed. 300 mL 0  . rosuvastatin (CRESTOR) 20 MG tablet TAKE 1 TABLET DAILY 90 tablet 2  . TRAVATAN Z 0.004 % SOLN ophthalmic solution Place 1 drop into both eyes daily.    Marland Kitchen loratadine (CLARITIN) 10 MG tablet Take 1 tablet (10 mg total) by mouth daily. (Patient not taking: Reported on 01/16/2017) 100 tablet 3   No facility-administered medications prior to visit.     ROS Review of Systems  Constitutional: Positive for fatigue. Negative for appetite change and unexpected weight change.  HENT: Negative for congestion, nosebleeds, sneezing, sore throat and trouble swallowing.   Eyes: Negative for itching and visual disturbance.    Respiratory: Negative for cough.   Cardiovascular: Negative for chest pain, palpitations and leg swelling.  Gastrointestinal: Negative for abdominal distention, blood in stool, diarrhea and nausea.  Genitourinary: Negative for frequency and hematuria.  Musculoskeletal: Positive for back pain and gait problem. Negative for joint swelling and neck pain.  Skin: Negative for rash.  Neurological: Negative for dizziness, tremors, speech difficulty and weakness.  Psychiatric/Behavioral: Negative for agitation, dysphoric mood and sleep disturbance. The patient is not nervous/anxious.     Objective:  BP (!) 144/82 (BP Location: Left Arm, Patient Position: Sitting, Cuff Size: Normal)   Pulse 62   Temp 99 F (37.2 C) (Oral)   Ht 5\' 7"  (1.702 m)   Wt 149 lb (67.6 kg)   SpO2 98%   BMI 23.34 kg/m   BP Readings from Last 3 Encounters:  07/23/17 (!) 144/82  01/16/17 (!) 162/80  06/29/16 (!) 168/64    Wt Readings from Last 3 Encounters:  07/23/17 149 lb (67.6 kg)  01/16/17 147 lb (66.7 kg)  06/29/16 152 lb (68.9 kg)    Physical Exam  Constitutional: He is oriented to person, place, and time. He appears well-developed. No distress.  NAD  HENT:  Mouth/Throat: Oropharynx is clear and moist.  Eyes: Pupils are equal, round, and reactive to light. Conjunctivae are normal.  Neck: Normal range  of motion. No JVD present. No thyromegaly present.  Cardiovascular: Normal rate, regular rhythm and intact distal pulses. Exam reveals no gallop and no friction rub.  Murmur heard. Pulmonary/Chest: Effort normal and breath sounds normal. No respiratory distress. He has no wheezes. He has no rales. He exhibits no tenderness.  Abdominal: Soft. Bowel sounds are normal. He exhibits no distension and no mass. There is no tenderness. There is no rebound and no guarding.  Musculoskeletal: Normal range of motion. He exhibits no edema or tenderness.  Lymphadenopathy:    He has no cervical adenopathy.   Neurological: He is alert and oriented to person, place, and time. He has normal reflexes. No cranial nerve deficit. He exhibits normal muscle tone. He displays a negative Romberg sign. Coordination abnormal. Gait normal.  Skin: Skin is warm and dry. No rash noted.  Psychiatric: He has a normal mood and affect. His behavior is normal. Judgment and thought content normal.  seb cyst LS spine 6x7 mm  Lab Results  Component Value Date   WBC 8.5 01/16/2017   HGB 12.5 (L) 01/16/2017   HCT 38.4 (L) 01/16/2017   PLT 206.0 01/16/2017   GLUCOSE 98 01/16/2017   CHOL 160 10/14/2014   TRIG 92.0 10/14/2014   HDL 52.70 10/14/2014   LDLDIRECT 136.2 09/28/2009   LDLCALC 89 10/14/2014   ALT 12 01/16/2017   AST 23 01/16/2017   NA 137 01/16/2017   K 3.9 01/16/2017   CL 101 01/16/2017   CREATININE 1.10 01/16/2017   BUN 14 01/16/2017   CO2 27 01/16/2017   TSH 1.79 01/16/2017   PSA 0.33 06/29/2016   INR 1.1 ratio (H) 10/06/2009   HGBA1C 5.6 12/09/2013    Dg Chest 2 View  Result Date: 01/16/2017 CLINICAL DATA:  Two weeks of cough. Gradual 15 pound weight loss over an un certain time. History of coronary artery disease and CABG. Former smoker. EXAM: CHEST  2 VIEW COMPARISON:  PA and lateral chest x-ray of Aug 17, 2015 FINDINGS: The lungs are well-expanded and clear. The heart and pulmonary vascularity are normal. There are post CABG changes. There is calcification in the wall of the aortic arch. There is no pleural effusion or pneumothorax. The bony thorax exhibits no acute abnormalities. IMPRESSION: Mild chronic bronchitic-smoking related changes, stable. No pneumonia, CHF, nor other acute cardiopulmonary abnormality. Thoracic aortic atherosclerosis. Electronically Signed   By: David  Martinique M.D.   On: 01/16/2017 13:31    Assessment & Plan:   There are no diagnoses linked to this encounter. I am having Dominica Severin L. Doenges maintain his Vitamin D3, nitroGLYCERIN, TRAVATAN Z, cycloSPORINE, loratadine,  ibuprofen, fluticasone, aspirin, promethazine-codeine, amLODipine, ezetimibe, clopidogrel, and rosuvastatin.  No orders of the defined types were placed in this encounter.    Follow-up: No follow-ups on file.  Walker Kehr, MD

## 2017-07-23 NOTE — Assessment & Plan Note (Signed)
BMET 

## 2017-07-23 NOTE — Assessment & Plan Note (Addendum)
some lower back pain from the fall 5 mo ago off and on - [facility] Will watch X ray if needed

## 2017-07-23 NOTE — Assessment & Plan Note (Signed)
Dr Lindi Adie - f/u in Oct 2019

## 2017-07-23 NOTE — Assessment & Plan Note (Signed)
Lopressor HCT, Losartan

## 2017-07-23 NOTE — Assessment & Plan Note (Signed)
Crestor, Zetia, ASA, Plavix 

## 2017-07-31 DIAGNOSIS — H25811 Combined forms of age-related cataract, right eye: Secondary | ICD-10-CM | POA: Diagnosis not present

## 2017-07-31 DIAGNOSIS — H401132 Primary open-angle glaucoma, bilateral, moderate stage: Secondary | ICD-10-CM | POA: Diagnosis not present

## 2017-08-28 DIAGNOSIS — H401132 Primary open-angle glaucoma, bilateral, moderate stage: Secondary | ICD-10-CM | POA: Diagnosis not present

## 2017-08-28 DIAGNOSIS — H2511 Age-related nuclear cataract, right eye: Secondary | ICD-10-CM | POA: Diagnosis not present

## 2017-08-28 DIAGNOSIS — H16223 Keratoconjunctivitis sicca, not specified as Sjogren's, bilateral: Secondary | ICD-10-CM | POA: Diagnosis not present

## 2017-09-05 ENCOUNTER — Ambulatory Visit (INDEPENDENT_AMBULATORY_CARE_PROVIDER_SITE_OTHER): Payer: Medicare Other | Admitting: Family Medicine

## 2017-09-05 ENCOUNTER — Encounter: Payer: Self-pay | Admitting: Family Medicine

## 2017-09-05 VITALS — BP 124/64 | HR 60 | Temp 98.6°F | Ht 67.0 in | Wt 150.0 lb

## 2017-09-05 DIAGNOSIS — M7989 Other specified soft tissue disorders: Secondary | ICD-10-CM

## 2017-09-05 NOTE — Progress Notes (Signed)
[name] [name] - 84 y.o. male MRN [id number]  Date of birth: 1932/10/30  SUBJECTIVE:  Including CC & ROS.  Chief Complaint  Patient presents with  . Leg Swelling    [name] [name] is a 84 y.o. male that is presenting with right leg edema. It has been progressing for past month and has not improved. Edema is localized lower leg. Denies pain or tenderness to the touch. He has not been wearing compression regularly, but started to wear it daily last week.He states it is difficult to put the compression stockings on. Denies pain when he is walking. He has chronic skin changes on his right lower leg. No trauma to his leg. Denies any recent travel. No SOB. Has a history of taking anti-coagulation but has been off of it for about 4 months or so. No chest pain   Review of his blood work from 4/9 shows normal creatinine.  It does show an anemia with hemoglobin 11.4.  Review of echo from 2014 shows grade 1 diastolic dysfunction and ejection fraction of 60 to 65%.  Does show moderate stenosis of the aortic valve.  Review of Systems  Constitutional: Negative for fever.  HENT: Negative for congestion.   Respiratory: Negative for cough.   Cardiovascular: Positive for leg swelling. Negative for chest pain.  Gastrointestinal: Negative for abdominal pain.  Musculoskeletal: Positive for gait problem.  Skin: Positive for color change.  Neurological: Negative for weakness.  Hematological: Negative for adenopathy.  Psychiatric/Behavioral: Negative for agitation.    HISTORY: Past Medical, Surgical, Social, and Family History Reviewed & Updated per EMR.   Pertinent Historical Findings include:  Past Medical History:  Diagnosis Date  . Anxiety   . Arthritis   . Carotid artery disease ([place])   . Coronary artery disease    sees Dr. Verl Blalock  . Depression   . DJD (degenerative joint disease)    right hip  . GERD (gastroesophageal reflux disease)    hx of  . Glaucoma   . Glucose intolerance (impaired glucose  tolerance)   . Headache(784.0)    hx of migraines  . Herpes zoster 2009   r. scalp and opth  . Hx of colonic polyps 2004   hyperplastic  . Hyperlipidemia   . Hypertension    sees Dr. Adelene Amas  . Internal and external hemorrhoids without complication   . Low back pain    facet arthropathy  . Lumbago   . Peripheral vascular disease ([place])   . Pneumonia 2009   rll with a small effusion  . Postherpetic neuralgia 2009    Past Surgical History:  Procedure Laterality Date  . ACNE CYST REMOVAL  1970's   removed x2 off the back  . CARPAL TUNNEL RELEASE     left  . CATARACT EXTRACTION W/PHACO  02/13/2012   Procedure: CATARACT EXTRACTION PHACO AND INTRAOCULAR LENS PLACEMENT (IOC);  Surgeon: Adonis Brook, MD;  Location: [facility];  Service: Ophthalmology;  Laterality: Left;  . COLONOSCOPY    . CORONARY ARTERY BYPASS GRAFT  2002  . REPOSITION OF LENS  03/14/2012   Procedure: REPOSITION OF LENS;  Surgeon: Adonis Brook, MD;  Location: [facility];  Service: Ophthalmology;  Laterality: Left;  Reposition Ocular Lens Left Eye  . thumb tendon surgery     left  . TONSILLECTOMY     age 65    Allergies  Allergen Reactions  . Benazepril Hcl Cough  . Cosopt [Dorzolamide Hcl-Timolol Mal] Other (See Comments)    Eyes water  .  Ezetimibe-Simvastatin Other (See Comments)    REACTION: aches  . Lipitor [Atorvastatin] Other (See Comments)    Muscle aches    Family History  Problem Relation Age of Onset  . Breast cancer Mother   . Pulmonary embolism Father   . Colon cancer Neg Hx   . Esophageal cancer Neg Hx      Social History   Socioeconomic History  . Marital status: Divorced    Spouse name: Not on file  . Number of children: 3  . Years of education: Not on file  . Highest education level: Not on file  Occupational History    Employer: RETIRED  Social Needs  . Financial resource strain: Not on file  . Food insecurity:    Worry: Not on file    Inability: Not on file  . Transportation  needs:    Medical: Not on file    Non-medical: Not on file  Tobacco Use  . Smoking status: Former Smoker    Packs/day: 0.75    Years: 25.00    Pack years: 18.75  . Smokeless tobacco: Never Used  . Tobacco comment: quit at age 46  Substance and Sexual Activity  . Alcohol use: Yes    Alcohol/week: 0.0 oz    Comment: infrequent glass of wine  . Drug use: No  . Sexual activity: Not Currently  Lifestyle  . Physical activity:    Days per week: Not on file    Minutes per session: Not on file  . Stress: Not on file  Relationships  . Social connections:    Talks on phone: Not on file    Gets together: Not on file    Attends religious service: Not on file    Active member of club or organization: Not on file    Attends meetings of clubs or organizations: Not on file    Relationship status: Not on file  . Intimate partner violence:    Fear of current or ex partner: Not on file    Emotionally abused: Not on file    Physically abused: Not on file    Forced sexual activity: Not on file  Other Topics Concern  . Not on file  Social History Narrative  . Not on file     PHYSICAL EXAM:  VS: BP 124/64 (BP Location: Left Arm, Patient Position: Sitting, Cuff Size: Normal)   Pulse 60   Temp 98.6 F (37 C) (Oral)   Ht 5\' 7"  (1.702 m)   Wt 150 lb (68 kg)   SpO2 99%   BMI 23.49 kg/m  Physical Exam Gen: NAD, alert, cooperative with exam,  ENT: normal lips, normal nasal mucosa,  Eye: normal EOM, normal conjunctiva and lids CV:  +2 edema of right lower extremity, +2 pedal pulses, S1S2, murmur present  Resp: no accessory muscle use, non-labored, no crackles or wheezing   Skin: venous statis changes on right lower leg  Neuro: normal tone, normal sensation to touch Psych:  normal insight, alert and oriented MSK: normal strength, normal right ankle AROM, no TTP of the anterior right tibia.      ASSESSMENT & PLAN:   Right leg swelling Unilateral leg swelling to suggest clot  formation. Has chronic venous changes but swelling is acute. No recent travel. No SOB  - check venous duplex  - provided sample of xarelto. Stop aspirin and continue plavix.  -Consider treating with an Unna boot with the swelling and chronic changes of his skin.

## 2017-09-05 NOTE — Patient Instructions (Signed)
Please stop the aspirin  We will order an ultrasound to look for a clot  Please take 15 mg tonight and 15 mg tomorrow morning.

## 2017-09-05 NOTE — Assessment & Plan Note (Addendum)
Unilateral leg swelling to suggest clot formation. Has chronic venous changes but swelling is acute. No recent travel. No SOB  - check venous duplex  - provided sample of xarelto. Stop aspirin and continue plavix.  -Consider treating with an Unna boot with the swelling and chronic changes of his skin.

## 2017-09-06 ENCOUNTER — Telehealth: Payer: Self-pay | Admitting: *Deleted

## 2017-09-06 NOTE — Telephone Encounter (Signed)
Spoke with Daughter informed her of Venous duplex scan  appointment on 09/10/17 at 8:00am.

## 2017-09-10 ENCOUNTER — Ambulatory Visit (HOSPITAL_COMMUNITY)
Admission: RE | Admit: 2017-09-10 | Discharge: 2017-09-10 | Disposition: A | Discharge status: Home / Self Care | Payer: Medicare Other | Source: Ambulatory Visit | Attending: Cardiovascular Disease | Admitting: Cardiovascular Disease

## 2017-09-10 ENCOUNTER — Telehealth: Payer: Self-pay | Admitting: Family Medicine

## 2017-09-10 DIAGNOSIS — M7989 Other specified soft tissue disorders: Secondary | ICD-10-CM | POA: Diagnosis not present

## 2017-09-10 MED ORDER — CEPHALEXIN 500 MG PO CAPS: 500.0000 mg | ORAL_CAPSULE | Freq: Two times a day (BID) | ORAL | 0 refills | Status: DC

## 2017-09-10 NOTE — Telephone Encounter (Signed)
Spoke with patient's daughter about his negative duplex. Will stop his anti-coagulation. Still having redness. Will treat for cellulitis.   Rosemarie Ax, MD [facility] 09/10/2017, 10:18 AM

## 2017-09-25 ENCOUNTER — Encounter: Payer: Self-pay | Admitting: Podiatry

## 2017-09-25 ENCOUNTER — Ambulatory Visit (INDEPENDENT_AMBULATORY_CARE_PROVIDER_SITE_OTHER): Payer: Medicare Other | Admitting: Podiatry

## 2017-09-25 DIAGNOSIS — M79676 Pain in unspecified toe(s): Secondary | ICD-10-CM | POA: Diagnosis not present

## 2017-09-25 DIAGNOSIS — B351 Tinea unguium: Secondary | ICD-10-CM

## 2017-09-25 DIAGNOSIS — D689 Coagulation defect, unspecified: Secondary | ICD-10-CM

## 2017-09-25 NOTE — Progress Notes (Signed)
Complaint:  Visit Type: Patient returns to my office for continued preventative foot care services. Complaint: Patient states" my nails have grown long and thick and become painful to walk and wear shoes" Patient has been diagnosed with peripheral arterial disease.. The patient presents for preventative foot care services. No changes to ROS.  Patient is taking plavix.  Podiatric Exam: Vascular: dorsalis pedis and posterior tibial pulses are palpable bilateral. Capillary return is immediate. Temperature gradient is WNL. Skin turgor WNL  Sensorium: Normal Semmes Weinstein monofilament test. Normal tactile sensation bilaterally. Nail Exam: Pt has thick disfigured discolored nails with subungual debris noted bilateral entire nail hallux through fifth toenails Ulcer Exam: There is no evidence of ulcer or pre-ulcerative changes or infection. Orthopedic Exam: Muscle tone and strength are WNL. No limitations in general ROM. No crepitus or effusions noted. Foot type and digits show no abnormalities. Bony prominences are unremarkable. Skin: No Porokeratosis. No infection or ulcers  Diagnosis:  Onychomycosis, , Pain in right toe, pain in left toes  Treatment & Plan Procedures and Treatment: Consent by patient was obtained for treatment procedures.   Debridement of mycotic and hypertrophic toenails, 1 through 5 bilateral and clearing of subungual debris. No ulceration, no infection noted. Dispense padding. Return Visit-Office Procedure: Patient instructed to return to the office for a follow up visit 3 months for continued evaluation and treatment.    Nick Armel DPM 

## 2017-11-12 ENCOUNTER — Telehealth: Payer: Self-pay | Admitting: Internal Medicine

## 2017-11-12 DIAGNOSIS — M7989 Other specified soft tissue disorders: Secondary | ICD-10-CM

## 2017-11-12 NOTE — Telephone Encounter (Signed)
Copied from [place] [id number]. Topic: Referral - Request >> Nov 12, 2017 12:58 PM Synthia Innocent wrote: Reason for CRM: Requesting referral to wound clinic for right leg wound, ongoing for since may. Saw Dr Raeford Razor in May.

## 2017-11-13 NOTE — Telephone Encounter (Signed)
Referral to wound center placed. May need to have follow up if no improvement. Possibly need for ABI's vs referral to vascular   Rosemarie Ax, MD [facility] 11/13/2017, 12:45 PM

## 2017-11-13 NOTE — Telephone Encounter (Signed)
Spoke with daughter-she is concerned her father's leg has not improved. Redness and scabbing still present, minimal swelling. She reports it is difficult for her to take off work to take to him to an appointment would like to know if he can be referred to a wound clinic? Please advise

## 2017-11-14 NOTE — Telephone Encounter (Signed)
Faxed referral to wound care to see if they will be able to see pt.  Pt's daughter is aware and informed her of Dr.Schmitz last note

## 2017-11-19 NOTE — Telephone Encounter (Signed)
Left message for daughter to return call-appointment scheduled 12/10/17 at 9:00am Per wound center this was the earliest appointment- If patient needs to be seen sooner patient/daughter can call to [facility] office [phone]

## 2017-11-22 ENCOUNTER — Encounter: Payer: Self-pay | Admitting: Internal Medicine

## 2017-11-22 ENCOUNTER — Ambulatory Visit (INDEPENDENT_AMBULATORY_CARE_PROVIDER_SITE_OTHER): Payer: Medicare Other | Admitting: Internal Medicine

## 2017-11-22 DIAGNOSIS — R42 Dizziness and giddiness: Secondary | ICD-10-CM

## 2017-11-22 DIAGNOSIS — I872 Venous insufficiency (chronic) (peripheral): Secondary | ICD-10-CM | POA: Diagnosis not present

## 2017-11-22 DIAGNOSIS — R600 Localized edema: Secondary | ICD-10-CM

## 2017-11-22 DIAGNOSIS — I130 Hypertensive heart and chronic kidney disease with heart failure and stage 1 through stage 4 chronic kidney disease, or unspecified chronic kidney disease: Secondary | ICD-10-CM

## 2017-11-22 DIAGNOSIS — I2510 Atherosclerotic heart disease of native coronary artery without angina pectoris: Secondary | ICD-10-CM | POA: Diagnosis not present

## 2017-11-22 MED ORDER — FUROSEMIDE 20 MG PO TABS: ORAL_TABLET | ORAL | 3 refills | Status: DC

## 2017-11-22 MED ORDER — TRIAMCINOLONE ACETONIDE 0.1 % EX OINT: 1.0000 "application " | TOPICAL_OINTMENT | Freq: Two times a day (BID) | CUTANEOUS | 3 refills | Status: DC

## 2017-11-22 NOTE — Assessment & Plan Note (Signed)
D/c Norvasc

## 2017-11-22 NOTE — Assessment & Plan Note (Signed)
Crestor, Zetia, ASA, Plavix

## 2017-11-22 NOTE — Assessment & Plan Note (Signed)
Worse D/c Amlodipine (-) DVT, took abx

## 2017-11-22 NOTE — Progress Notes (Signed)
Subjective:  Patient ID: [name] [name], male    DOB: [birth year]  Age: 84 y.o. MRN: [id number]  CC: No chief complaint on file.   HPI SADE MEHLHOFF presents for anticoag, B LE swelling,   Outpatient Medications Prior to Visit  Medication Sig Dispense Refill  . amLODipine (NORVASC) 5 MG tablet Take 1 tablet (5 mg total) daily by mouth. 90 tablet 3  . aspirin 81 MG tablet Take 1 tablet (81 mg total) by mouth daily. 100 tablet 3  . Cholecalciferol (VITAMIN D3) 1000 UNITS CAPS Take 1 capsule by mouth daily.      . clopidogrel (PLAVIX) 75 MG tablet Take 1 tablet (75 mg total) by mouth daily. 90 tablet 3  . cycloSPORINE (RESTASIS) 0.05 % ophthalmic emulsion Place 1 drop into both eyes 2 (two) times daily.    Marland Kitchen ezetimibe (ZETIA) 10 MG tablet Take 1 tablet (10 mg total) by mouth daily. 90 tablet 3  . fluticasone (FLONASE) 50 MCG/ACT nasal spray     . ibuprofen (ADVIL,MOTRIN) 200 MG tablet Take 200 mg by mouth every 6 (six) hours as needed.    . nitroGLYCERIN (NITROSTAT) 0.4 MG SL tablet Place 1 tablet (0.4 mg total) under the tongue every 5 (five) minutes as needed. For chest pain 20 tablet 3  . rosuvastatin (CRESTOR) 20 MG tablet TAKE 1 TABLET DAILY 90 tablet 2  . TRAVATAN Z 0.004 % SOLN ophthalmic solution Place 1 drop into both eyes daily.    . cephALEXin (KEFLEX) 500 MG capsule Take 1 capsule (500 mg total) by mouth 2 (two) times daily. (Patient not taking: Reported on 11/22/2017) 14 capsule 0   No facility-administered medications prior to visit.     ROS: Review of Systems  Constitutional: Negative for appetite change, fatigue and unexpected weight change.  HENT: Negative for congestion, nosebleeds, sneezing, sore throat and trouble swallowing.   Eyes: Negative for itching and visual disturbance.  Respiratory: Negative for cough.   Cardiovascular: Negative for chest pain, palpitations and leg swelling.  Gastrointestinal: Negative for abdominal distention, blood in stool, diarrhea and  nausea.  Genitourinary: Negative for frequency and hematuria.  Musculoskeletal: Negative for back pain, gait problem, joint swelling and neck pain.  Skin: Negative for rash.  Neurological: Negative for dizziness, tremors, speech difficulty and weakness.  Psychiatric/Behavioral: Negative for agitation, dysphoric mood and sleep disturbance. The patient is not nervous/anxious.     Objective:  BP (!) 160/72 (BP Location: Left Arm, Patient Position: Sitting, Cuff Size: Normal)   Pulse 70   Ht 5\' 7"  (1.702 m)   Wt 154 lb (69.9 kg)   SpO2 97%   BMI 24.12 kg/m   BP Readings from Last 3 Encounters:  11/22/17 (!) 160/72  09/05/17 124/64  07/23/17 (!) 144/82    Wt Readings from Last 3 Encounters:  11/22/17 154 lb (69.9 kg)  09/05/17 150 lb (68 kg)  07/23/17 149 lb (67.6 kg)    Physical Exam  Constitutional: He is oriented to person, place, and time. He appears well-developed. No distress.  NAD  HENT:  Mouth/Throat: Oropharynx is clear and moist.  Eyes: Pupils are equal, round, and reactive to light. Conjunctivae are normal.  Neck: Normal range of motion. No JVD present. No thyromegaly present.  Cardiovascular: Normal rate, regular rhythm, normal heart sounds and intact distal pulses. Exam reveals no gallop and no friction rub.  No murmur heard. Pulmonary/Chest: Effort normal and breath sounds normal. No respiratory distress. He has no wheezes. He has  no rales. He exhibits no tenderness.  Abdominal: Soft. Bowel sounds are normal. He exhibits no distension and no mass. There is no tenderness. There is no rebound and no guarding.  Musculoskeletal: Normal range of motion. He exhibits no edema or tenderness.  Lymphadenopathy:    He has no cervical adenopathy.  Neurological: He is alert and oriented to person, place, and time. He has normal reflexes. No cranial nerve deficit. He exhibits normal muscle tone. He displays a negative Romberg sign. Coordination and gait normal.  Skin: Skin is  warm and dry. No rash noted.  Psychiatric: He has a normal mood and affect. His behavior is normal. Judgment and thought content normal.    Lab Results  Component Value Date   WBC 6.8 07/23/2017   HGB 11.4 (L) 07/23/2017   HCT 35.2 (L) 07/23/2017   PLT 197.0 07/23/2017   GLUCOSE 90 07/23/2017   CHOL 160 10/14/2014   TRIG 92.0 10/14/2014   HDL 52.70 10/14/2014   LDLDIRECT 136.2 09/28/2009   LDLCALC 89 10/14/2014   ALT 18 07/23/2017   AST 30 07/23/2017   NA 140 07/23/2017   K 4.6 07/23/2017   CL 106 07/23/2017   CREATININE 1.05 07/23/2017   BUN 17 07/23/2017   CO2 29 07/23/2017   TSH 1.32 07/23/2017   PSA 0.37 07/23/2017   INR 1.1 ratio (H) 10/06/2009   HGBA1C 5.6 12/09/2013    No results found.  Assessment & Plan:   There are no diagnoses linked to this encounter.   No orders of the defined types were placed in this encounter.    Follow-up: No follow-ups on file.  Walker Kehr, MD

## 2017-11-22 NOTE — Patient Instructions (Addendum)
Stop Amlodipine Elevate legs      Chronic Venous Insufficiency Chronic venous insufficiency, also called venous stasis, is a condition that prevents blood from being pumped effectively through the veins in your legs. Blood may no longer be pumped effectively from the legs back to the heart. This condition can range from mild to severe. With proper treatment, you should be able to continue with an active life. What are the causes? Chronic venous insufficiency occurs when the vein walls become stretched, weakened, or damaged, or when valves within the vein are damaged. Some common causes of this include:  High blood pressure inside the veins (venous hypertension).  Increased blood pressure in the leg veins from long periods of sitting or standing.  A blood clot that blocks blood flow in a vein (deep vein thrombosis, DVT).  Inflammation of a vein (phlebitis) that causes a blood clot to form.  Tumors in the pelvis that cause blood to back up.  What increases the risk? The following factors may make you more likely to develop this condition:  Having a family history of this condition.  Obesity.  Pregnancy.  Living without enough physical activity or exercise (sedentary lifestyle).  Smoking.  Having a job that requires long periods of standing or sitting in one place.  Being a certain age. Women in their 48s and 85s and men in their 41s are more likely to develop this condition.  What are the signs or symptoms? Symptoms of this condition include:  Veins that are enlarged, bulging, or twisted (varicose veins).  Skin breakdown or ulcers.  Reddened or discolored skin on the front of the leg.  Brown, smooth, tight, and painful skin just above the ankle, usually on the inside of the leg (lipodermatosclerosis).  Swelling.  How is this diagnosed? This condition may be diagnosed based on:  Your medical history.  A physical exam.  Tests, such as: ? A procedure that  creates an image of a blood vessel and nearby organs and provides information about blood flow through the blood vessel (duplex ultrasound). ? A procedure that tests blood flow (plethysmography). ? A procedure to look at the veins using X-ray and dye (venogram).  How is this treated? The goals of treatment are to help you return to an active life and to minimize pain or disability. Treatment depends on the severity of your condition, and it may include:  Wearing compression stockings. These can help relieve symptoms and help prevent your condition from getting worse. However, they do not cure the condition.  Sclerotherapy. This is a procedure involving an injection of a material that "dissolves" damaged veins.  Surgery. This may involve: ? Removing a diseased vein (vein stripping). ? Cutting off blood flow through the vein (laser ablation surgery). ? Repairing a valve.  Follow these instructions at home:  Wear compression stockings as told by your health care provider. These stockings help to prevent blood clots and reduce swelling in your legs.  Take over-the-counter and prescription medicines only as told by your health care provider.  Stay active by exercising, walking, or doing different activities. Ask your health care provider what activities are safe for you and how much exercise you need.  Drink enough fluid to keep your urine clear or pale yellow.  Do not use any products that contain nicotine or tobacco, such as cigarettes and e-cigarettes. If you need help quitting, ask your health care provider.  Keep all follow-up visits as told by your health care provider. This is  important. Contact a health care provider if:  You have redness, swelling, or more pain in the affected area.  You see a red streak or line that extends up or down from the affected area.  You have skin breakdown or a loss of skin in the affected area, even if the breakdown is small.  You get an injury in  the affected area. Get help right away if:  You get an injury and an open wound in the affected area.  You have severe pain that does not get better with medicine.  You have sudden numbness or weakness in the foot or ankle below the affected area, or you have trouble moving your foot or ankle.  You have a fever and you have worse or persistent symptoms.  You have chest pain.  You have shortness of breath. Summary  Chronic venous insufficiency, also called venous stasis, is a condition that prevents blood from being pumped effectively through the veins in your legs.  Chronic venous insufficiency occurs when the vein walls become stretched, weakened, or damaged, or when valves within the vein are damaged.  Treatment for this condition depends on how severe your condition is, and it may involve wearing compression stockings or having a procedure.  Make sure you stay active by exercising, walking, or doing different activities. Ask your health care provider what activities are safe for you and how much exercise you need. This information is not intended to replace advice given to you by your health care provider. Make sure you discuss any questions you have with your health care provider. Document Released: 08/06/2006 Document Revised: 02/20/2016 Document Reviewed: 02/20/2016 Elsevier Interactive Patient Education  2017 [employer].

## 2017-12-10 ENCOUNTER — Encounter (HOSPITAL_BASED_OUTPATIENT_CLINIC_OR_DEPARTMENT_OTHER): Payer: Medicare Other | Attending: Internal Medicine

## 2017-12-10 DIAGNOSIS — B354 Tinea corporis: Secondary | ICD-10-CM | POA: Diagnosis not present

## 2017-12-10 DIAGNOSIS — Z951 Presence of aortocoronary bypass graft: Secondary | ICD-10-CM | POA: Diagnosis not present

## 2017-12-10 DIAGNOSIS — L539 Erythematous condition, unspecified: Secondary | ICD-10-CM | POA: Diagnosis not present

## 2017-12-10 DIAGNOSIS — I70203 Unspecified atherosclerosis of native arteries of extremities, bilateral legs: Secondary | ICD-10-CM | POA: Insufficient documentation

## 2017-12-10 DIAGNOSIS — S81801A Unspecified open wound, right lower leg, initial encounter: Secondary | ICD-10-CM | POA: Diagnosis not present

## 2017-12-10 DIAGNOSIS — I872 Venous insufficiency (chronic) (peripheral): Secondary | ICD-10-CM | POA: Insufficient documentation

## 2017-12-10 DIAGNOSIS — S81802A Unspecified open wound, left lower leg, initial encounter: Secondary | ICD-10-CM | POA: Diagnosis not present

## 2017-12-10 DIAGNOSIS — I739 Peripheral vascular disease, unspecified: Secondary | ICD-10-CM | POA: Diagnosis not present

## 2017-12-25 ENCOUNTER — Ambulatory Visit (INDEPENDENT_AMBULATORY_CARE_PROVIDER_SITE_OTHER): Payer: Medicare Other | Admitting: Podiatry

## 2017-12-25 ENCOUNTER — Encounter: Payer: Self-pay | Admitting: Podiatry

## 2017-12-25 DIAGNOSIS — M79676 Pain in unspecified toe(s): Secondary | ICD-10-CM | POA: Diagnosis not present

## 2017-12-25 DIAGNOSIS — D689 Coagulation defect, unspecified: Secondary | ICD-10-CM

## 2017-12-25 DIAGNOSIS — B351 Tinea unguium: Secondary | ICD-10-CM | POA: Diagnosis not present

## 2017-12-25 NOTE — Progress Notes (Signed)
Complaint:  Visit Type: Patient returns to my office for continued preventative foot care services. Complaint: Patient states" my nails have grown long and thick and become painful to walk and wear shoes" Patient has been diagnosed with peripheral arterial disease.. The patient presents for preventative foot care services. No changes to ROS.  Patient is taking plavix.  Podiatric Exam: Vascular: dorsalis pedis and posterior tibial pulses are palpable bilateral. Capillary return is immediate. Temperature gradient is WNL. Skin turgor WNL  Sensorium: Normal Semmes Weinstein monofilament test. Normal tactile sensation bilaterally. Nail Exam: Pt has thick disfigured discolored nails with subungual debris noted bilateral entire nail hallux through fifth toenails Ulcer Exam: There is no evidence of ulcer or pre-ulcerative changes or infection. Orthopedic Exam: Muscle tone and strength are WNL. No limitations in general ROM. No crepitus or effusions noted. Foot type and digits show no abnormalities. Bony prominences are unremarkable. Skin: No Porokeratosis. No infection or ulcers  Diagnosis:  Onychomycosis, , Pain in right toe, pain in left toes  Treatment & Plan Procedures and Treatment: Consent by patient was obtained for treatment procedures.   Debridement of mycotic and hypertrophic toenails, 1 through 5 bilateral and clearing of subungual debris. No ulceration, no infection noted. Dispense padding. Return Visit-Office Procedure: Patient instructed to return to the office for a follow up visit 3 months for continued evaluation and treatment.    Gardiner Barefoot DPM

## 2018-02-14 ENCOUNTER — Other Ambulatory Visit: Payer: Self-pay | Admitting: Internal Medicine

## 2018-03-26 ENCOUNTER — Ambulatory Visit (INDEPENDENT_AMBULATORY_CARE_PROVIDER_SITE_OTHER): Payer: Medicare Other | Admitting: Podiatry

## 2018-03-26 ENCOUNTER — Encounter: Payer: Self-pay | Admitting: Podiatry

## 2018-03-26 DIAGNOSIS — B351 Tinea unguium: Secondary | ICD-10-CM

## 2018-03-26 DIAGNOSIS — M79676 Pain in unspecified toe(s): Secondary | ICD-10-CM

## 2018-03-26 DIAGNOSIS — D689 Coagulation defect, unspecified: Secondary | ICD-10-CM

## 2018-03-26 NOTE — Progress Notes (Addendum)
Complaint:  Visit Type: Patient returns to my office for continued preventative foot care services. Complaint: Patient states" my nails have grown long and thick and become painful to walk and wear shoes" Patient has been diagnosed with peripheral arterial disease.. The patient presents for preventative foot care services. No changes to ROS.  Patient is taking plavix.  Podiatric Exam: Vascular: dorsalis pedis and posterior tibial pulses are palpable bilateral. Capillary return is immediate. Temperature gradient is WNL. Skin turgor WNL  Sensorium: Normal Semmes Weinstein monofilament test. Normal tactile sensation bilaterally. Nail Exam: Pt has thick disfigured discolored nails with subungual debris noted bilateral entire nail hallux through fifth toenails Ulcer Exam: There is no evidence of ulcer or pre-ulcerative changes or infection. Orthopedic Exam: Muscle tone and strength are WNL. No limitations in general ROM. No crepitus or effusions noted. Foot type and digits show no abnormalities. Bony prominences are unremarkable. Skin: No Porokeratosis. No infection or ulcers  Diagnosis:  Onychomycosis, , Pain in right toe, pain in left toes  Treatment & Plan Procedures and Treatment: Consent by patient was obtained for treatment procedures.   Debridement of mycotic and hypertrophic toenails, 1 through 5 bilateral and clearing of subungual debris. No ulceration, no infection noted. ABN signed for 2019. Return Visit-Office Procedure: Patient instructed to return to the office for a follow up visit 3 months for continued evaluation and treatment.    Gardiner Barefoot DPM

## 2018-04-05 ENCOUNTER — Other Ambulatory Visit: Payer: Self-pay | Admitting: Internal Medicine

## 2018-04-19 ENCOUNTER — Other Ambulatory Visit: Payer: Self-pay | Admitting: Internal Medicine

## 2018-05-02 ENCOUNTER — Other Ambulatory Visit (INDEPENDENT_AMBULATORY_CARE_PROVIDER_SITE_OTHER): Payer: Medicare Other

## 2018-05-02 ENCOUNTER — Ambulatory Visit (INDEPENDENT_AMBULATORY_CARE_PROVIDER_SITE_OTHER): Payer: Medicare Other | Admitting: Nurse Practitioner

## 2018-05-02 ENCOUNTER — Encounter: Payer: Self-pay | Admitting: Nurse Practitioner

## 2018-05-02 VITALS — BP 170/78 | HR 62 | Temp 98.1°F | Ht 67.0 in | Wt 144.0 lb

## 2018-05-02 DIAGNOSIS — R29818 Other symptoms and signs involving the nervous system: Secondary | ICD-10-CM

## 2018-05-02 DIAGNOSIS — J309 Allergic rhinitis, unspecified: Secondary | ICD-10-CM | POA: Diagnosis not present

## 2018-05-02 DIAGNOSIS — R202 Paresthesia of skin: Secondary | ICD-10-CM | POA: Diagnosis not present

## 2018-05-02 DIAGNOSIS — R634 Abnormal weight loss: Secondary | ICD-10-CM | POA: Diagnosis not present

## 2018-05-02 DIAGNOSIS — R410 Disorientation, unspecified: Secondary | ICD-10-CM | POA: Diagnosis not present

## 2018-05-02 LAB — CBC
HCT: 37.6 % — ABNORMAL LOW (ref 39.0–52.0)
Hemoglobin: 12 g/dL — ABNORMAL LOW (ref 13.0–17.0)
MCHC: 31.9 g/dL (ref 30.0–36.0)
MCV: 74.8 fl — ABNORMAL LOW (ref 78.0–100.0)
Platelets: 225 10*3/uL (ref 150.0–400.0)
RBC: 5.02 Mil/uL (ref 4.22–5.81)
RDW: 17.4 % — ABNORMAL HIGH (ref 11.5–15.5)
WBC: 6 10*3/uL (ref 4.0–10.5)

## 2018-05-02 LAB — COMPREHENSIVE METABOLIC PANEL
ALK PHOS: 85 U/L (ref 39–117)
ALT: 10 U/L (ref 0–53)
AST: 21 U/L (ref 0–37)
Albumin: 4.2 g/dL (ref 3.5–5.2)
BUN: 17 mg/dL (ref 6–23)
CO2: 24 mEq/L (ref 19–32)
Calcium: 9.7 mg/dL (ref 8.4–10.5)
Chloride: 107 mEq/L (ref 96–112)
Creatinine, Ser: 1.04 mg/dL (ref 0.40–1.50)
GFR: 82.44 mL/min (ref >60.00)
Glucose, Bld: 84 mg/dL (ref 70–99)
Potassium: 3.8 mEq/L (ref 3.5–5.1)
Sodium: 140 mEq/L (ref 135–145)
Total Bilirubin: 1.2 mg/dL (ref 0.2–1.2)
Total Protein: 6.9 g/dL (ref 6.0–8.3)

## 2018-05-02 LAB — URINALYSIS, ROUTINE W REFLEX MICROSCOPIC
Bilirubin Urine: NEGATIVE
Hgb urine dipstick: NEGATIVE
Ketones, ur: NEGATIVE
Leukocytes, UA: NEGATIVE
Nitrite: NEGATIVE
PH: 5.5 (ref 5.0–8.0)
RBC / HPF: NONE SEEN (ref 0–?)
Specific Gravity, Urine: 1.025 (ref 1.000–1.030)
Total Protein, Urine: NEGATIVE
Urine Glucose: NEGATIVE
Urobilinogen, UA: 0.2 (ref 0.0–1.0)

## 2018-05-02 LAB — VITAMIN B12: Vitamin B-12: 460 pg/mL (ref 211–911)

## 2018-05-02 LAB — TSH: TSH: 1.3 u[IU]/mL (ref 0.35–4.50)

## 2018-05-02 NOTE — Assessment & Plan Note (Signed)
Referral to ENT at patient request - Ambulatory referral to ENT

## 2018-05-02 NOTE — Patient Instructions (Addendum)
Head downstairs for labs  You will be contacted to schedule MRI and set up appointments with neurology, ENT  Please return in about 1 month for follow up with dr plotnikov.

## 2018-05-02 NOTE — Progress Notes (Signed)
[name] [name] is a 85 y.o. male with the following history as recorded in EpicCare:  Patient Active Problem List   Diagnosis Date Noted  . Right leg swelling 09/05/2017  . Epistaxis 01/16/2017  . Tinea pedis 06/29/2016  . Vertigo 02/13/2016  . MGUS (monoclonal gammopathy of unknown significance) 01/31/2016  . ARF (acute renal failure) ([place]) 12/29/2015  . Acute kidney injury ([place]) 12/23/2015  . Diarrhea 12/21/2015  . Asymptomatic gallstones 08/31/2015  . Loss of weight 08/17/2015  . Edema 08/17/2015  . Serous otitis media 05/20/2015  . Sebaceous cyst of left axilla 10/15/2014  . Peripheral arterial disease ([place]) 12/29/2013  . Cerumen impaction 12/09/2013  . Pain in lower limb 08/17/2013  . Dry skin 06/10/2013  . Aortic valve stenosis 12/11/2012  . Allergic rhinitis 08/19/2012  . Onychomycosis 08/12/2012  . Pain in joint, ankle and foot 08/12/2012  . CTS (carpal tunnel syndrome) 05/13/2012  . Chronic venous insufficiency 05/13/2012  . Right bundle branch block 12/10/2011  . Pneumonia   . Cough 03/31/2010  . TOBACCO USE, QUIT 04/01/2009  . CORONARY ATHEROSCLEROSIS NATIVE CORONARY ARTERY 03/25/2009  . CAROTID ARTERY STENOSIS, WITHOUT INFARCTION 02/11/2009  . Abdominal pain, epigastric 11/17/2008  . PLEURAL EFFUSION, RIGHT 02/03/2008  . DYSPNEA 02/03/2008  . FOOT PAIN 09/22/2007  . ABNORMAL LIVER FUNCTION TESTS 09/22/2007  . POSTHERPETIC NEURALGIA 07/17/2007  . LOW BACK PAIN 07/17/2007  . COLONIC POLYPS, HX OF 07/17/2007  . SHINGLES 07/03/2007  . Dyslipidemia 05/01/2007  . Benign hypertensive heart and renal disease with renal failure 05/01/2007  . MYALGIA 05/01/2007  . ABNORMAL GLUCOSE NEC 05/01/2007    Current Outpatient Medications  Medication Sig Dispense Refill  . aspirin 81 MG tablet Take 1 tablet (81 mg total) by mouth daily. 100 tablet 3  . Cholecalciferol (VITAMIN D3) 1000 UNITS CAPS Take 1 capsule by mouth daily.      . cycloSPORINE (RESTASIS) 0.05 %  ophthalmic emulsion Place 1 drop into both eyes 2 (two) times daily.    . fluticasone (FLONASE) 50 MCG/ACT nasal spray     . furosemide (LASIX) 20 MG tablet Take on Wed-Fri- Sun prn swelling 30 tablet 3  . ibuprofen (ADVIL,MOTRIN) 200 MG tablet Take 200 mg by mouth every 6 (six) hours as needed.    . nitroGLYCERIN (NITROSTAT) 0.4 MG SL tablet Place 1 tablet (0.4 mg total) under the tongue every 5 (five) minutes as needed. For chest pain 20 tablet 3  . TRAVATAN Z 0.004 % SOLN ophthalmic solution Place 1 drop into both eyes daily.    Marland Kitchen triamcinolone ointment (KENALOG) 0.1 % Apply 1 application topically 2 (two) times daily. 80 g 3  . clopidogrel (PLAVIX) 75 MG tablet Take 1 tablet (75 mg total) by mouth daily. -- Office visit needed for further refills (Patient not taking: Reported on 05/02/2018) 90 tablet 0  . ezetimibe (ZETIA) 10 MG tablet Take 1 tablet (10 mg total) by mouth daily. -- Office visit needed for further refills (Patient not taking: Reported on 05/02/2018) 90 tablet 0  . rosuvastatin (CRESTOR) 20 MG tablet TAKE 1 TABLET DAILY (Patient not taking: Reported on 05/02/2018) 90 tablet 2   No current facility-administered medications for this visit.     Allergies: Atorvastatin; Benazepril hcl; Cosopt [dorzolamide hcl-timolol mal]; Ezetimibe-simvastatin; and Norvasc [amlodipine besylate]  Past Medical History:  Diagnosis Date  . Anxiety   . Arthritis   . Carotid artery disease ([place])   . Coronary artery disease    sees Dr. Verl Blalock  .  Depression   . DJD (degenerative joint disease)    right hip  . GERD (gastroesophageal reflux disease)    hx of  . Glaucoma   . Glucose intolerance (impaired glucose tolerance)   . Headache(784.0)    hx of migraines  . Herpes zoster 2009   r. scalp and opth  . Hx of colonic polyps 2004   hyperplastic  . Hyperlipidemia   . Hypertension    sees Dr. Adelene Amas  . Internal and external hemorrhoids without complication   . Low back pain    facet  arthropathy  . Lumbago   . Peripheral vascular disease ([place])   . Pneumonia 2009   rll with a small effusion  . Postherpetic neuralgia 2009    Past Surgical History:  Procedure Laterality Date  . ACNE CYST REMOVAL  1970's   removed x2 off the back  . CARPAL TUNNEL RELEASE     left  . CATARACT EXTRACTION W/PHACO  02/13/2012   Procedure: CATARACT EXTRACTION PHACO AND INTRAOCULAR LENS PLACEMENT (IOC);  Surgeon: Adonis Brook, MD;  Location: [facility];  Service: Ophthalmology;  Laterality: Left;  . COLONOSCOPY    . CORONARY ARTERY BYPASS GRAFT  2002  . REPOSITION OF LENS  03/14/2012   Procedure: REPOSITION OF LENS;  Surgeon: Adonis Brook, MD;  Location: [facility];  Service: Ophthalmology;  Laterality: Left;  Reposition Ocular Lens Left Eye  . thumb tendon surgery     left  . TONSILLECTOMY     age 49    Family History  Problem Relation Age of Onset  . Breast cancer Mother   . Pulmonary embolism Father   . Colon cancer Neg Hx   . Esophageal cancer Neg Hx     Social History   Tobacco Use  . Smoking status: Former Smoker    Packs/day: 0.75    Years: 25.00    Pack years: 18.75  . Smokeless tobacco: Never Used  . Tobacco comment: quit at age 46  Substance Use Topics  . Alcohol use: Yes    Alcohol/week: 0.0 standard drinks    Comment: infrequent glass of wine     Subjective:  [name] [name] is here today for evaluation of weight loss, hallucinations, accompanied by daughter who he lives with to visit today. She actually tells me that these problems are not new for him, have been ongoing for "a while" and that his PCP stopped several of his medications over this past year to see if this would help him. Daughter says after medication changes, the patient seemed to eat better for a while but more recently has seemed to start losing weight again. THey also tell me that he heard voices calling him outside of the house about 2 nights ago, he went outside but no one was there so he came back in, this  is not the first time he has heard voices but daughter is very concerned that he actually went out of the house in the middle of the night, hes never done that before. Daughter is also requesting ENT referral today, for evaluation of allergies, she says he has suffered from allergies for some time and would like to see specialist.  BP Readings from Last 3 Encounters:  05/02/18 (!) 170/78  11/22/17 (!) 160/72  09/05/17 124/64    Wt Readings from Last 3 Encounters:  05/02/18 144 lb (65.3 kg)  11/22/17 154 lb (69.9 kg)  09/05/17 150 lb (68 kg)   Body mass index is 22.55 kg/m.  Review of Systems  Constitutional: Negative for chills and fever.  Respiratory: Negative for shortness of breath.   Cardiovascular: Negative for chest pain and palpitations.  Gastrointestinal: Negative for abdominal pain, nausea and vomiting.  Genitourinary: Negative for dysuria and hematuria.  Musculoskeletal: Negative for falls.  Neurological: Positive for weakness. Negative for dizziness and loss of consciousness.  Psychiatric/Behavioral: Positive for memory loss.   Objective:  Vitals:   05/02/18 1102  BP: (!) 170/78  Pulse: 62  Temp: 98.1 F (36.7 C)  TempSrc: Oral  SpO2: 99%  Weight: 144 lb (65.3 kg)  Height: 5\' 7"  (1.702 m)    General: Well developed, well nourished, in no acute distress  Skin : Warm and dry.  Head: Normocephalic and atraumatic  Eyes: Sclera and conjunctiva clear; pupils round and reactive to light; extraocular movements intact  Ears: External normal; canals clear; tympanic membranes normal  Oropharynx: Pink, supple. No suspicious lesions  Neck: Supple without thyromegaly, adenopathy  Lungs: Respirations unlabored; clear to auscultation bilaterally without wheeze, rales, rhonchi  CVS exam: normal rate and regular rhythm, S1 and S2 normal.  Musculoskeletal: No deformities; no active joint inflammation  Extremities: No edema, cyanosis, clubbing  Vessels: Symmetric bilaterally   Neurologic: Alert and oriented; speech intact; face symmetrical; moves all extremities well; CNII-XII intact without focal deficit  Psychiatric: Normal mood and affect. Pleasant. Memory is impaired.  Assessment:  1. Confusion   2. Allergic rhinitis, unspecified seasonality, unspecified trigger   3. Weight loss   4. Other symptoms and signs involving the nervous system   5. Paresthesia     Plan:   Confusion Update labs, MRI Recommend referral to neurology for further evaluation, they are agreeable - Comprehensive metabolic panel; Future - CBC; Future - Vitamin B12; Future - TSH; Future - [name] Brain Wo Contrast; Future - Urinalysis, Routine w reflex microscopic; Future - CULTURE, URINE COMPREHENSIVE; Future - Ambulatory referral to Neurology  Weight loss No significant weight loss noted Update labs F/u with PCP as planned - Comprehensive metabolic panel; Future - CBC; Future - TSH; Future  Other symptoms and signs involving the nervous system - [name] Brain Wo Contrast; Future  Paresthesia - Vitamin B12; Future   Return in about 1 month (around 06/02/2018).  Orders Placed This Encounter  Procedures  . CULTURE, URINE COMPREHENSIVE    Standing Status:   Future    Standing Expiration Date:   06/02/2018  . [name] Brain Wo Contrast    Standing Status:   Future    Standing Expiration Date:   07/01/2019    Order Specific Question:   What is the patient's sedation requirement?    Answer:   No Sedation    Order Specific Question:   Does the patient have a pacemaker or implanted devices?    Answer:   No    Order Specific Question:   Preferred imaging location?    Answer:   [facility] (table limit-550lbs)    Order Specific Question:   Radiology Contrast Protocol - do NOT remove file path    Answer:   [email or web address]  . Comprehensive metabolic panel    Standing Status:   Future    Standing Expiration Date:   05/03/2019  . CBC    Standing Status:    Future    Standing Expiration Date:   05/03/2019  . Vitamin B12    Standing Status:   Future    Standing Expiration Date:   05/02/2019  . TSH    Standing  Status:   Future    Standing Expiration Date:   05/03/2019  . Urinalysis, Routine w reflex microscopic    Standing Status:   Future    Standing Expiration Date:   05/02/2019  . Ambulatory referral to ENT    Referral Priority:   Routine    Referral Type:   Consultation    Referral Reason:   Specialty Services Required    Requested Specialty:   Otolaryngology    Number of Visits Requested:   1  . Ambulatory referral to Neurology    Referral Priority:   Routine    Referral Type:   Consultation    Referral Reason:   Specialty Services Required    Requested Specialty:   Neurology    Number of Visits Requested:   1    Requested Prescriptions    No prescriptions requested or ordered in this encounter

## 2018-05-05 LAB — CULTURE, URINE COMPREHENSIVE
MICRO NUMBER:: 70926
SPECIMEN QUALITY:: ADEQUATE

## 2018-05-12 ENCOUNTER — Encounter: Payer: Self-pay | Admitting: Internal Medicine

## 2018-05-12 ENCOUNTER — Ambulatory Visit (INDEPENDENT_AMBULATORY_CARE_PROVIDER_SITE_OTHER): Payer: Medicare Other | Admitting: Internal Medicine

## 2018-05-12 DIAGNOSIS — F0280 Dementia in other diseases classified elsewhere without behavioral disturbance: Secondary | ICD-10-CM

## 2018-05-12 DIAGNOSIS — I2510 Atherosclerotic heart disease of native coronary artery without angina pectoris: Secondary | ICD-10-CM

## 2018-05-12 DIAGNOSIS — G301 Alzheimer's disease with late onset: Secondary | ICD-10-CM

## 2018-05-12 DIAGNOSIS — R440 Auditory hallucinations: Secondary | ICD-10-CM | POA: Diagnosis not present

## 2018-05-12 DIAGNOSIS — R634 Abnormal weight loss: Secondary | ICD-10-CM

## 2018-05-12 DIAGNOSIS — F0390 Unspecified dementia without behavioral disturbance: Secondary | ICD-10-CM | POA: Insufficient documentation

## 2018-05-12 MED ORDER — DONEPEZIL HCL 5 MG PO TABS: 5.0000 mg | ORAL_TABLET | Freq: Every day | ORAL | 3 refills | Status: DC

## 2018-05-12 NOTE — Progress Notes (Signed)
Subjective:  Patient ID: [name] [name], male    DOB: [birth year]  Age: 85 y.o. MRN: [id number]  CC: No chief complaint on file.   HPI [name] [name] presents for wt loss. F/u "hearing voices". F/u AS C/o R ear clicking  Outpatient Medications Prior to Visit  Medication Sig Dispense Refill  . Cholecalciferol (VITAMIN D3) 1000 UNITS CAPS Take 1 capsule by mouth daily.      . cycloSPORINE (RESTASIS) 0.05 % ophthalmic emulsion Place 1 drop into both eyes 2 (two) times daily.    . fluticasone (FLONASE) 50 MCG/ACT nasal spray     . furosemide (LASIX) 20 MG tablet Take on Wed-Fri- Sun prn swelling 30 tablet 3  . ibuprofen (ADVIL,MOTRIN) 200 MG tablet Take 200 mg by mouth every 6 (six) hours as needed.    . rosuvastatin (CRESTOR) 20 MG tablet TAKE 1 TABLET DAILY 90 tablet 2  . TRAVATAN Z 0.004 % SOLN ophthalmic solution Place 1 drop into both eyes daily.    Marland Kitchen triamcinolone ointment (KENALOG) 0.1 % Apply 1 application topically 2 (two) times daily. 80 g 3  . aspirin 81 MG tablet Take 1 tablet (81 mg total) by mouth daily. 100 tablet 3  . clopidogrel (PLAVIX) 75 MG tablet Take 1 tablet (75 mg total) by mouth daily. -- Office visit needed for further refills (Patient not taking: Reported on 05/02/2018) 90 tablet 0  . ezetimibe (ZETIA) 10 MG tablet Take 1 tablet (10 mg total) by mouth daily. -- Office visit needed for further refills (Patient not taking: Reported on 05/02/2018) 90 tablet 0  . nitroGLYCERIN (NITROSTAT) 0.4 MG SL tablet Place 1 tablet (0.4 mg total) under the tongue every 5 (five) minutes as needed. For chest pain 20 tablet 3   No facility-administered medications prior to visit.     ROS: Review of Systems  Constitutional: Positive for unexpected weight change. Negative for appetite change and fatigue.  HENT: Negative for congestion, nosebleeds, sneezing, sore throat and trouble swallowing.   Eyes: Negative for itching and visual disturbance.  Respiratory: Negative for cough.     Cardiovascular: Negative for chest pain, palpitations and leg swelling.  Gastrointestinal: Negative for abdominal distention, blood in stool, diarrhea and nausea.  Genitourinary: Negative for frequency and hematuria.  Musculoskeletal: Negative for back pain, gait problem, joint swelling and neck pain.  Skin: Negative for rash.  Neurological: Negative for dizziness, tremors, speech difficulty and weakness.  Psychiatric/Behavioral: Positive for decreased concentration. Negative for agitation, dysphoric mood, sleep disturbance and suicidal ideas. The patient is not nervous/anxious.     Objective:  BP 134/68 (BP Location: Left Arm, Patient Position: Sitting, Cuff Size: Normal)   Pulse 62   Temp 98.5 F (36.9 C) (Oral)   Ht 5\' 7"  (1.702 m)   Wt 149 lb (67.6 kg)   SpO2 99%   BMI 23.34 kg/m   BP Readings from Last 3 Encounters:  05/12/18 134/68  05/02/18 (!) 170/78  11/22/17 (!) 160/72    Wt Readings from Last 3 Encounters:  05/12/18 149 lb (67.6 kg)  05/02/18 144 lb (65.3 kg)  11/22/17 154 lb (69.9 kg)    Physical Exam Constitutional:      General: He is not in acute distress.    Appearance: He is well-developed.     Comments: NAD  Eyes:     Conjunctiva/sclera: Conjunctivae normal.     Pupils: Pupils are equal, round, and reactive to light.  Neck:     Musculoskeletal: Normal range  of motion.     Thyroid: No thyromegaly.     Vascular: No JVD.  Cardiovascular:     Rate and Rhythm: Normal rate and regular rhythm.     Heart sounds: Murmur present. No friction rub. No gallop.   Pulmonary:     Effort: Pulmonary effort is normal. No respiratory distress.     Breath sounds: Normal breath sounds. No wheezing or rales.  Chest:     Chest wall: No tenderness.  Abdominal:     General: Bowel sounds are normal. There is no distension.     Palpations: Abdomen is soft. There is no mass.     Tenderness: There is no abdominal tenderness. There is no guarding or rebound.   Musculoskeletal: Normal range of motion.        General: No tenderness.  Lymphadenopathy:     Cervical: No cervical adenopathy.  Skin:    General: Skin is warm and dry.     Findings: No rash.  Neurological:     Mental Status: He is alert and oriented to person, place, and time.     Cranial Nerves: No cranial nerve deficit.     Motor: No abnormal muscle tone.     Coordination: Coordination abnormal.     Gait: Gait normal.     Deep Tendon Reflexes: Reflexes are normal and symmetric.  Psychiatric:        Behavior: Behavior normal.        Thought Content: Thought content normal.        Judgment: Judgment normal.   cane A/o x2/c 100-7=73 Clock test - failed  Lab Results  Component Value Date   WBC 6.0 05/02/2018   HGB 12.0 (L) 05/02/2018   HCT 37.6 (L) 05/02/2018   PLT 225.0 05/02/2018   GLUCOSE 84 05/02/2018   CHOL 160 10/14/2014   TRIG 92.0 10/14/2014   HDL 52.70 10/14/2014   LDLDIRECT 136.2 09/28/2009   LDLCALC 89 10/14/2014   ALT 10 05/02/2018   AST 21 05/02/2018   NA 140 05/02/2018   K 3.8 05/02/2018   CL 107 05/02/2018   CREATININE 1.04 05/02/2018   BUN 17 05/02/2018   CO2 24 05/02/2018   TSH 1.30 05/02/2018   PSA 0.37 07/23/2017   INR 1.1 ratio (H) 10/06/2009   HGBA1C 5.6 12/09/2013    Vas [place] Lower Extremity Venous (dvt)  Result Date: 09/11/2017  Lower Venous DVT Study Indication: Swelling. Risk Factors: None Identified. Anticoagulation: Aspirin. Examination Guidelines: A complete evaluation includes B-mode imaging, spectral Doppler, color Doppler, and power Doppler as needed of all accessible portions of each vessel. Bilateral testing is considered an integral part of a complete examination. Limited examinations for reoccurring indications may be performed as noted. The reflux portion of the exam is performed with the patient in reverse Trendelenburg.  Right Venous Findings: +---------+---------------+---------+-----------+----------+-------+           CompressibilityPhasicitySpontaneityPropertiesSummary +---------+---------------+---------+-----------+----------+-------+ CFV      Full           Yes      Yes                          +---------+---------------+---------+-----------+----------+-------+ SFJ      Full           Yes      Yes                          +---------+---------------+---------+-----------+----------+-------+ FV  Prox  Full           Yes      Yes                          +---------+---------------+---------+-----------+----------+-------+ FV Mid   Full           Yes      Yes                          +---------+---------------+---------+-----------+----------+-------+ FV DistalFull                                                 +---------+---------------+---------+-----------+----------+-------+ PFV      Full                                                 +---------+---------------+---------+-----------+----------+-------+ POP      Full           Yes      Yes                          +---------+---------------+---------+-----------+----------+-------+ PTV      Full           Yes      Yes                          +---------+---------------+---------+-----------+----------+-------+ PERO     Full           Yes      Yes                          +---------+---------------+---------+-----------+----------+-------+ Gastroc  Full                                                 +---------+---------------+---------+-----------+----------+-------+ GSV      Full           Yes      Yes                          +---------+---------------+---------+-----------+----------+-------+ Technically difficult study due to swelling.  Right Technical Findings: Limited evaluation of the distal femoral and calf veins.  Left Venous Findings: +---+---------------+---------+-----------+----------+-------+    CompressibilityPhasicitySpontaneityPropertiesSummary  +---+---------------+---------+-----------+----------+-------+ CFVFull           Yes      Yes                          +---+---------------+---------+-----------+----------+-------+   Findings reported to Dr. Raeford Razor at 8:28.  Final Interpretation: Right: No evidence of deep vein thrombosis in the lower extremity. No indirect evidence of obstruction proximal to the inguinal ligament. Left: No evidence of common femoral vein obstruction.  *See table(s) above for measurements and observations. Electronically signed by Carlyle Dolly on 09/11/2017 at 1:57:58 PM.    Final     Assessment & Plan:   There are  no diagnoses linked to this encounter.   No orders of the defined types were placed in this encounter.    Follow-up: No follow-ups on file.  Walker Kehr, MD

## 2018-05-12 NOTE — Assessment & Plan Note (Signed)
dementia related - start Aricept

## 2018-05-12 NOTE — Assessment & Plan Note (Signed)
Discussed options - Aricept

## 2018-05-12 NOTE — Assessment & Plan Note (Signed)
Reduce crestor if GI upset

## 2018-05-12 NOTE — Assessment & Plan Note (Signed)
Better on diet 

## 2018-05-17 ENCOUNTER — Ambulatory Visit
Admission: RE | Admit: 2018-05-17 | Discharge: 2018-05-17 | Disposition: A | Discharge status: Home / Self Care | Payer: Medicare Other | Source: Ambulatory Visit | Attending: Nurse Practitioner | Admitting: Nurse Practitioner

## 2018-05-17 DIAGNOSIS — R410 Disorientation, unspecified: Secondary | ICD-10-CM

## 2018-05-17 DIAGNOSIS — R29818 Other symptoms and signs involving the nervous system: Secondary | ICD-10-CM

## 2018-05-22 ENCOUNTER — Encounter: Payer: Self-pay | Admitting: Neurology

## 2018-06-24 ENCOUNTER — Ambulatory Visit (INDEPENDENT_AMBULATORY_CARE_PROVIDER_SITE_OTHER): Payer: Medicare Other | Admitting: Podiatry

## 2018-06-24 ENCOUNTER — Encounter: Payer: Self-pay | Admitting: Podiatry

## 2018-06-24 DIAGNOSIS — M79676 Pain in unspecified toe(s): Secondary | ICD-10-CM | POA: Diagnosis not present

## 2018-06-24 DIAGNOSIS — D689 Coagulation defect, unspecified: Secondary | ICD-10-CM

## 2018-06-24 DIAGNOSIS — B351 Tinea unguium: Secondary | ICD-10-CM | POA: Diagnosis not present

## 2018-06-24 NOTE — Progress Notes (Signed)
Complaint:  Visit Type: Patient returns to my office for continued preventative foot care services. Complaint: Patient states" my nails have grown long and thick and become painful to walk and wear shoes" Patient has been diagnosed with peripheral arterial disease.. The patient presents for preventative foot care services. No changes to ROS.  Patient is taking plavix.  Podiatric Exam: Vascular: dorsalis pedis and posterior tibial pulses are palpable bilateral. Capillary return is immediate. Temperature gradient is WNL. Skin turgor WNL  Sensorium: Normal Semmes Weinstein monofilament test. Normal tactile sensation bilaterally. Nail Exam: Pt has thick disfigured discolored nails with subungual debris noted bilateral entire nail hallux through fifth toenails Ulcer Exam: There is no evidence of ulcer or pre-ulcerative changes or infection. Orthopedic Exam: Muscle tone and strength are WNL. No limitations in general ROM. No crepitus or effusions noted. Foot type and digits show no abnormalities. Bony prominences are unremarkable. Skin: No Porokeratosis. No infection or ulcers  Diagnosis:  Onychomycosis, , Pain in right toe, pain in left toes  Treatment & Plan Procedures and Treatment: Consent by patient was obtained for treatment procedures.   Debridement of mycotic and hypertrophic toenails, 1 through 5 bilateral and clearing of subungual debris. No ulceration, no infection noted. ABN signed for 2019. Return Visit-Office Procedure: Patient instructed to return to the office for a follow up visit 3 months for continued evaluation and treatment.    Gardiner Barefoot DPM

## 2018-06-25 ENCOUNTER — Ambulatory Visit: Payer: PRIVATE HEALTH INSURANCE | Admitting: Podiatry

## 2018-07-09 ENCOUNTER — Other Ambulatory Visit: Payer: Self-pay | Admitting: Internal Medicine

## 2018-07-23 ENCOUNTER — Ambulatory Visit: Payer: Medicare Other | Admitting: Neurology

## 2018-09-03 ENCOUNTER — Telehealth: Payer: Self-pay | Admitting: Internal Medicine

## 2018-09-03 NOTE — Telephone Encounter (Signed)
Copied from [place] [phone]. Topic: Quick Communication - Rx Refill/Question >> Sep 03, 2018  3:15 PM Reyne Dumas L wrote: Medication:   Pt's daughter, [name] wants to have a call to go over what medication her dad should be on. Candy can be reached at [phone].

## 2018-09-04 NOTE — Telephone Encounter (Signed)
Pt has appointment tomorrow to go over meds and get labs

## 2018-09-04 NOTE — Telephone Encounter (Signed)
See TE.

## 2018-09-05 ENCOUNTER — Other Ambulatory Visit (INDEPENDENT_AMBULATORY_CARE_PROVIDER_SITE_OTHER): Payer: Medicare Other

## 2018-09-05 ENCOUNTER — Encounter: Payer: Self-pay | Admitting: Internal Medicine

## 2018-09-05 ENCOUNTER — Ambulatory Visit (INDEPENDENT_AMBULATORY_CARE_PROVIDER_SITE_OTHER): Payer: Medicare Other | Admitting: Internal Medicine

## 2018-09-05 DIAGNOSIS — I2510 Atherosclerotic heart disease of native coronary artery without angina pectoris: Secondary | ICD-10-CM | POA: Diagnosis not present

## 2018-09-05 DIAGNOSIS — E785 Hyperlipidemia, unspecified: Secondary | ICD-10-CM

## 2018-09-05 DIAGNOSIS — R634 Abnormal weight loss: Secondary | ICD-10-CM | POA: Diagnosis not present

## 2018-09-05 DIAGNOSIS — M5440 Lumbago with sciatica, unspecified side: Secondary | ICD-10-CM

## 2018-09-05 DIAGNOSIS — N179 Acute kidney failure, unspecified: Secondary | ICD-10-CM

## 2018-09-05 LAB — URINALYSIS
Bilirubin Urine: NEGATIVE
Hgb urine dipstick: NEGATIVE
Ketones, ur: NEGATIVE
Leukocytes,Ua: NEGATIVE
Nitrite: NEGATIVE
Specific Gravity, Urine: 1.025 (ref 1.000–1.030)
Total Protein, Urine: NEGATIVE
Urine Glucose: NEGATIVE
Urobilinogen, UA: 0.2 (ref 0.0–1.0)
pH: 6 (ref 5.0–8.0)

## 2018-09-05 LAB — HEPATIC FUNCTION PANEL
ALT: 11 U/L (ref 0–53)
AST: 19 U/L (ref 0–37)
Albumin: 4 g/dL (ref 3.5–5.2)
Alkaline Phosphatase: 76 U/L (ref 39–117)
Bilirubin, Direct: 0.1 mg/dL (ref 0.0–0.3)
Total Bilirubin: 1 mg/dL (ref 0.2–1.2)
Total Protein: 6.5 g/dL (ref 6.0–8.3)

## 2018-09-05 LAB — CBC WITH DIFFERENTIAL/PLATELET
Basophils Absolute: 0 10*3/uL (ref 0.0–0.1)
Basophils Relative: 0.5 % (ref 0.0–3.0)
Eosinophils Absolute: 0.1 10*3/uL (ref 0.0–0.7)
Eosinophils Relative: 2.2 % (ref 0.0–5.0)
HCT: 36.3 % — ABNORMAL LOW (ref 39.0–52.0)
Hemoglobin: 11.8 g/dL — ABNORMAL LOW (ref 13.0–17.0)
Lymphocytes Relative: 22.1 % (ref 12.0–46.0)
Lymphs Abs: 1.2 10*3/uL (ref 0.7–4.0)
MCHC: 32.4 g/dL (ref 30.0–36.0)
MCV: 76.8 fl — ABNORMAL LOW (ref 78.0–100.0)
Monocytes Absolute: 0.5 10*3/uL (ref 0.1–1.0)
Monocytes Relative: 9.2 % (ref 3.0–12.0)
Neutro Abs: 3.6 10*3/uL (ref 1.4–7.7)
Neutrophils Relative %: 66 % (ref 43.0–77.0)
Platelets: 181 10*3/uL (ref 150.0–400.0)
RBC: 4.73 Mil/uL (ref 4.22–5.81)
RDW: 17.1 % — ABNORMAL HIGH (ref 11.5–15.5)
WBC: 5.5 10*3/uL (ref 4.0–10.5)

## 2018-09-05 LAB — LIPID PANEL
Cholesterol: 297 mg/dL — ABNORMAL HIGH (ref 0–200)
HDL: 74.8 mg/dL (ref >39.00)
LDL Cholesterol: 211 mg/dL — ABNORMAL HIGH (ref 0–99)
NonHDL: 221.98
Total CHOL/HDL Ratio: 4
Triglycerides: 56 mg/dL (ref 0.0–149.0)
VLDL: 11.2 mg/dL (ref 0.0–40.0)

## 2018-09-05 LAB — BASIC METABOLIC PANEL
BUN: 23 mg/dL (ref 6–23)
CO2: 26 mEq/L (ref 19–32)
Calcium: 9.3 mg/dL (ref 8.4–10.5)
Chloride: 108 mEq/L (ref 96–112)
Creatinine, Ser: 1.06 mg/dL (ref 0.40–1.50)
GFR: 80.58 mL/min (ref >60.00)
Glucose, Bld: 72 mg/dL (ref 70–99)
Potassium: 3.8 mEq/L (ref 3.5–5.1)
Sodium: 143 mEq/L (ref 135–145)

## 2018-09-05 LAB — TSH: TSH: 0.91 u[IU]/mL (ref 0.35–4.50)

## 2018-09-05 NOTE — Assessment & Plan Note (Signed)
Labs

## 2018-09-05 NOTE — Assessment & Plan Note (Signed)
Off meds - does not want to take any meds Lipids

## 2018-09-05 NOTE — Assessment & Plan Note (Signed)
TSH 

## 2018-09-05 NOTE — Progress Notes (Signed)
Subjective:  Patient ID: [name] [name], male    DOB: 1932/08/30  Age: 85 y.o. MRN: [id number]  CC: No chief complaint on file.   HPI TYDEN KANN presents for HTN, CAD, CHF f/u.  The patient is here with his daughter [name].  They stopped most of his medicines due to side effects.  He seems to be doing better: eating better, gaining weight without his previous meds.  Aricept gave him hallucinations and aggressive behavior  Outpatient Medications Prior to Visit  Medication Sig Dispense Refill  . Cholecalciferol (VITAMIN D3) 1000 UNITS CAPS Take 1 capsule by mouth daily.      . cycloSPORINE (RESTASIS) 0.05 % ophthalmic emulsion Place 1 drop into both eyes 2 (two) times daily.    Marland Kitchen ibuprofen (ADVIL,MOTRIN) 200 MG tablet Take 200 mg by mouth every 6 (six) hours as needed.    . clopidogrel (PLAVIX) 75 MG tablet TAKE 1 TABLET DAILY. PLEASEMAKE AN APPOINTMENT WITH   YOUR DOCTOR (Patient not taking: Reported on 09/05/2018) 90 tablet 3  . donepezil (ARICEPT) 5 MG tablet Take 1 tablet (5 mg total) by mouth at bedtime. (Patient not taking: Reported on 09/05/2018) 90 tablet 3  . ezetimibe (ZETIA) 10 MG tablet TAKE 1 TABLET DAILY. PLEASEMAKE AN APPOINTMENT WITH   YOUR DOCTOR (Patient not taking: Reported on 09/05/2018) 90 tablet 3  . fluticasone (FLONASE) 50 MCG/ACT nasal spray     . furosemide (LASIX) 20 MG tablet Take on Wed-Fri- Sun prn swelling (Patient not taking: Reported on 09/05/2018) 30 tablet 3  . rosuvastatin (CRESTOR) 20 MG tablet TAKE 1 TABLET DAILY (Patient not taking: Reported on 09/05/2018) 90 tablet 2  . TRAVATAN Z 0.004 % SOLN ophthalmic solution Place 1 drop into both eyes daily.    Marland Kitchen triamcinolone ointment (KENALOG) 0.1 % Apply 1 application topically 2 (two) times daily. (Patient not taking: Reported on 09/05/2018) 80 g 3   No facility-administered medications prior to visit.     ROS: Review of Systems  Constitutional: Negative for appetite change, fatigue and unexpected weight  change.  HENT: Negative for congestion, nosebleeds, sneezing, sore throat and trouble swallowing.   Eyes: Negative for itching and visual disturbance.  Respiratory: Negative for cough.   Cardiovascular: Negative for chest pain, palpitations and leg swelling.  Gastrointestinal: Negative for abdominal distention, blood in stool, diarrhea and nausea.  Genitourinary: Negative for frequency and hematuria.  Musculoskeletal: Positive for arthralgias and gait problem. Negative for back pain, joint swelling and neck pain.  Skin: Negative for rash.  Neurological: Negative for dizziness, tremors, speech difficulty and weakness.  Psychiatric/Behavioral: Negative for agitation, dysphoric mood, sleep disturbance and suicidal ideas. The patient is not nervous/anxious.     Objective:  BP (!) 142/76 (BP Location: Left Arm, Patient Position: Sitting, Cuff Size: Normal)   Pulse (!) 54   Temp 98.6 F (37 C) (Oral)   Ht 5\' 7"  (1.702 m)   Wt 150 lb (68 kg)   SpO2 99%   BMI 23.49 kg/m   BP Readings from Last 3 Encounters:  09/05/18 (!) 142/76  05/12/18 134/68  05/02/18 (!) 170/78    Wt Readings from Last 3 Encounters:  09/05/18 150 lb (68 kg)  05/12/18 149 lb (67.6 kg)  05/02/18 144 lb (65.3 kg)    Physical Exam Constitutional:      General: He is not in acute distress.    Appearance: He is well-developed.     Comments: NAD  Eyes:     Conjunctiva/sclera:  Conjunctivae normal.     Pupils: Pupils are equal, round, and reactive to light.  Neck:     Musculoskeletal: Normal range of motion.     Thyroid: No thyromegaly.     Vascular: No JVD.  Cardiovascular:     Rate and Rhythm: Normal rate and regular rhythm.     Heart sounds: Normal heart sounds. No murmur. No friction rub. No gallop.   Pulmonary:     Effort: Pulmonary effort is normal. No respiratory distress.     Breath sounds: Normal breath sounds. No wheezing or rales.  Chest:     Chest wall: No tenderness.  Abdominal:     General:  Bowel sounds are normal. There is no distension.     Palpations: Abdomen is soft. There is no mass.     Tenderness: There is no abdominal tenderness. There is no guarding or rebound.  Musculoskeletal: Normal range of motion.        General: No tenderness.  Lymphadenopathy:     Cervical: No cervical adenopathy.  Skin:    General: Skin is warm and dry.     Findings: No rash.  Neurological:     Mental Status: He is alert.     Cranial Nerves: No cranial nerve deficit.     Motor: No abnormal muscle tone.     Coordination: Coordination abnormal.     Gait: Gait normal.     Deep Tendon Reflexes: Reflexes are normal and symmetric.  Psychiatric:        Behavior: Behavior normal.        Thought Content: Thought content normal.        Judgment: Judgment normal.   Is alert, cooperative Slightly ataxic  Lab Results  Component Value Date   WBC 6.0 05/02/2018   HGB 12.0 (L) 05/02/2018   HCT 37.6 (L) 05/02/2018   PLT 225.0 05/02/2018   GLUCOSE 84 05/02/2018   CHOL 160 10/14/2014   TRIG 92.0 10/14/2014   HDL 52.70 10/14/2014   LDLDIRECT 136.2 09/28/2009   LDLCALC 89 10/14/2014   ALT 10 05/02/2018   AST 21 05/02/2018   NA 140 05/02/2018   K 3.8 05/02/2018   CL 107 05/02/2018   CREATININE 1.04 05/02/2018   BUN 17 05/02/2018   CO2 24 05/02/2018   TSH 1.30 05/02/2018   PSA 0.37 07/23/2017   INR 1.1 ratio (H) 10/06/2009   HGBA1C 5.6 12/09/2013    Mr Brain Wo Contrast  Result Date: 05/18/2018 CLINICAL DATA:  Memory problems and gait disturbance. Post herpetic neuralgia. Confusion. EXAM: MRI HEAD WITHOUT CONTRAST TECHNIQUE: Multiplanar, multiecho pulse sequences of the brain and surrounding structures were obtained without intravenous contrast. COMPARISON:  CT head 03/30/2005. FINDINGS: The patient was unable to remain motionless for the exam. Small or subtle lesions could be overlooked. Brain: No evidence for acute infarction, hemorrhage, mass lesion, hydrocephalus, or extra-axial fluid.  Generalized atrophy. Prominence of the ventricles, cisterns, and sulci. No significant white matter disease. No significant areas of cortical or lacunar infarction. Vascular: Flow voids are maintained. Skull and upper cervical spine: Empty sella, uncertain significance. No tonsillar herniation. Moderate pannus surrounds the odontoid, but there is no frank cervicomedullary compression. Sinuses/Orbits: Mucosal thickening, but no significant opacity. No orbital mass. LEFT cataract extraction. Other: None. Compared with prior CT, there is progression of cerebral volume loss. IMPRESSION: Atrophy.  No acute intracranial findings. Empty sella, uncertain significance. Noncompressive pannus cervicomedullary junction. Electronically Signed   By: Staci Righter M.D.   On: 05/18/2018  11:12    Assessment & Plan:   There are no diagnoses linked to this encounter.   No orders of the defined types were placed in this encounter.    Follow-up: No follow-ups on file.  Walker Kehr, MD

## 2018-09-05 NOTE — Assessment & Plan Note (Signed)
Labs/UA

## 2018-09-10 IMAGING — DX DG CHEST 2V
2 series · 2 of 2 positions shown · non-contrast
Comparison: PA and lateral chest x-ray August 17, 2015

CLINICAL DATA: Two weeks of cough. Gradual 15 pound weight loss
over an un certain time. History of coronary artery disease and
CABG. Former smoker.

EXAM:
CHEST  2 VIEW

[chest pa]
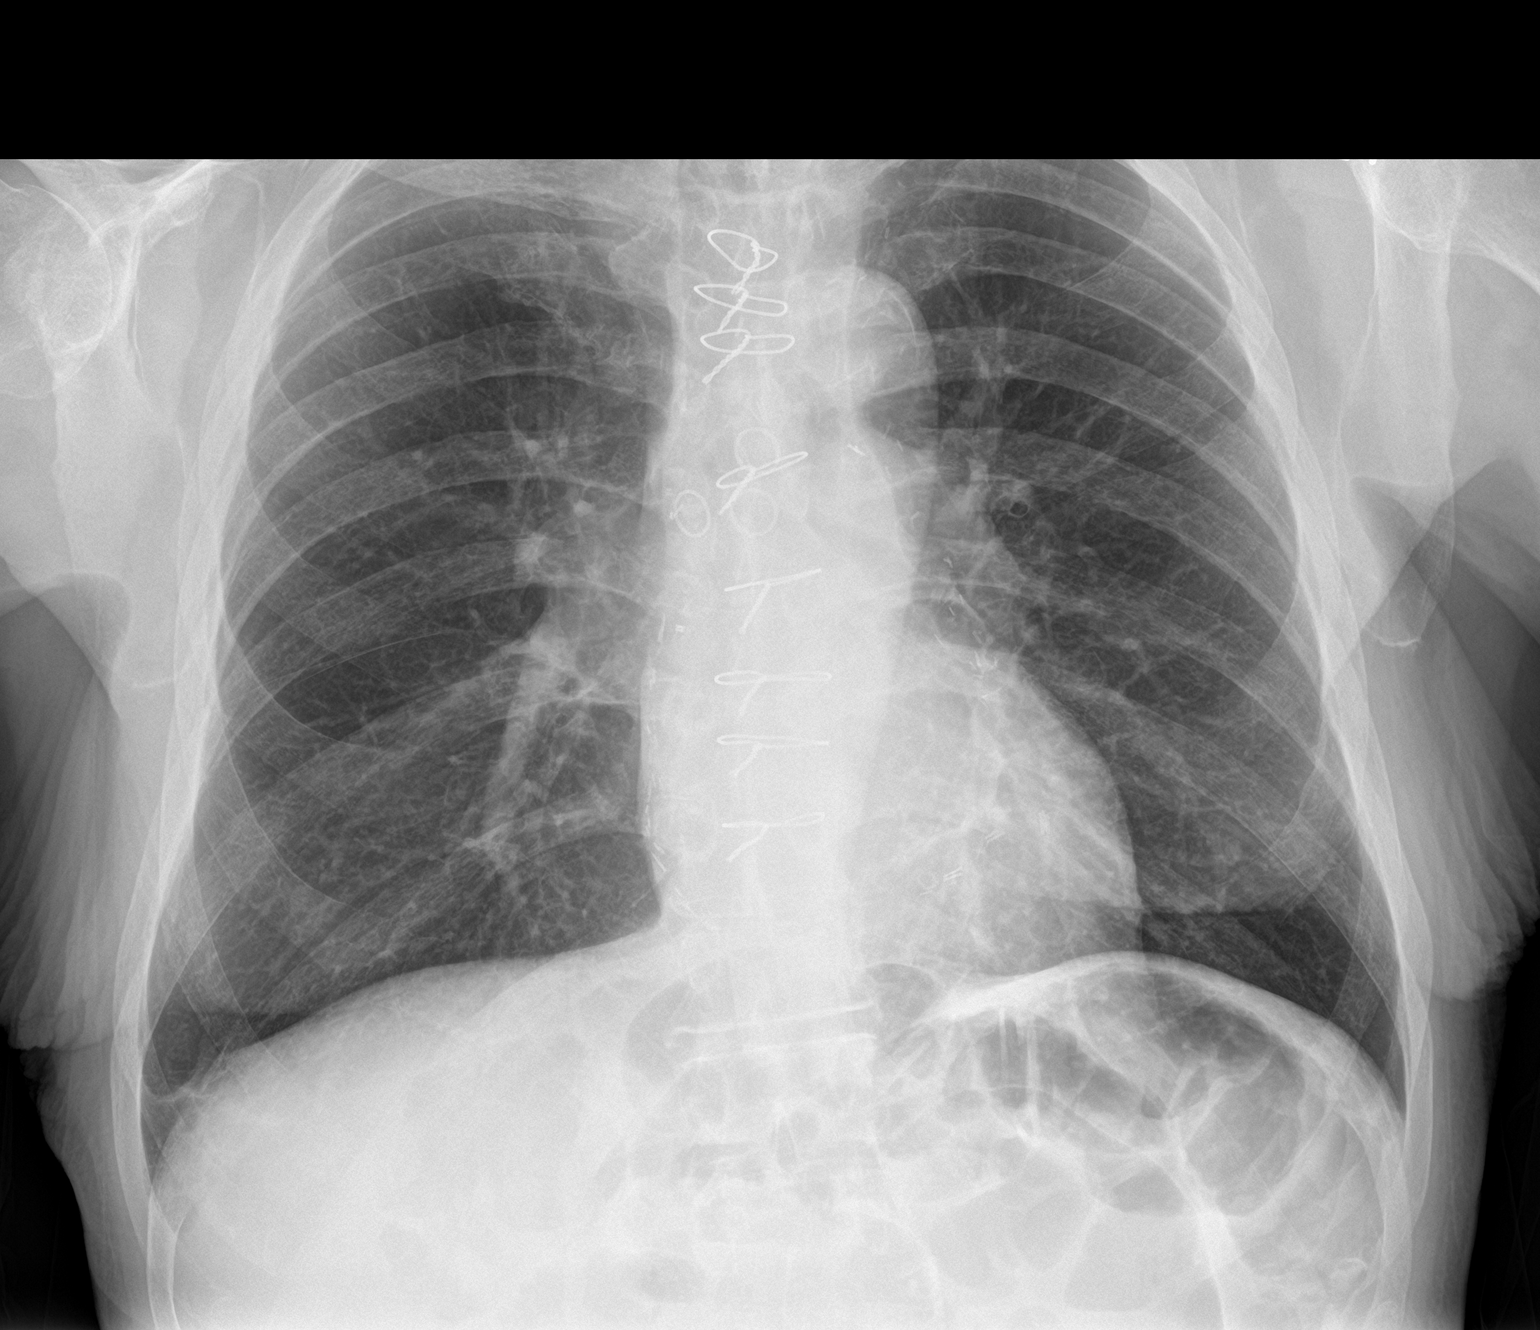

[chest lat]
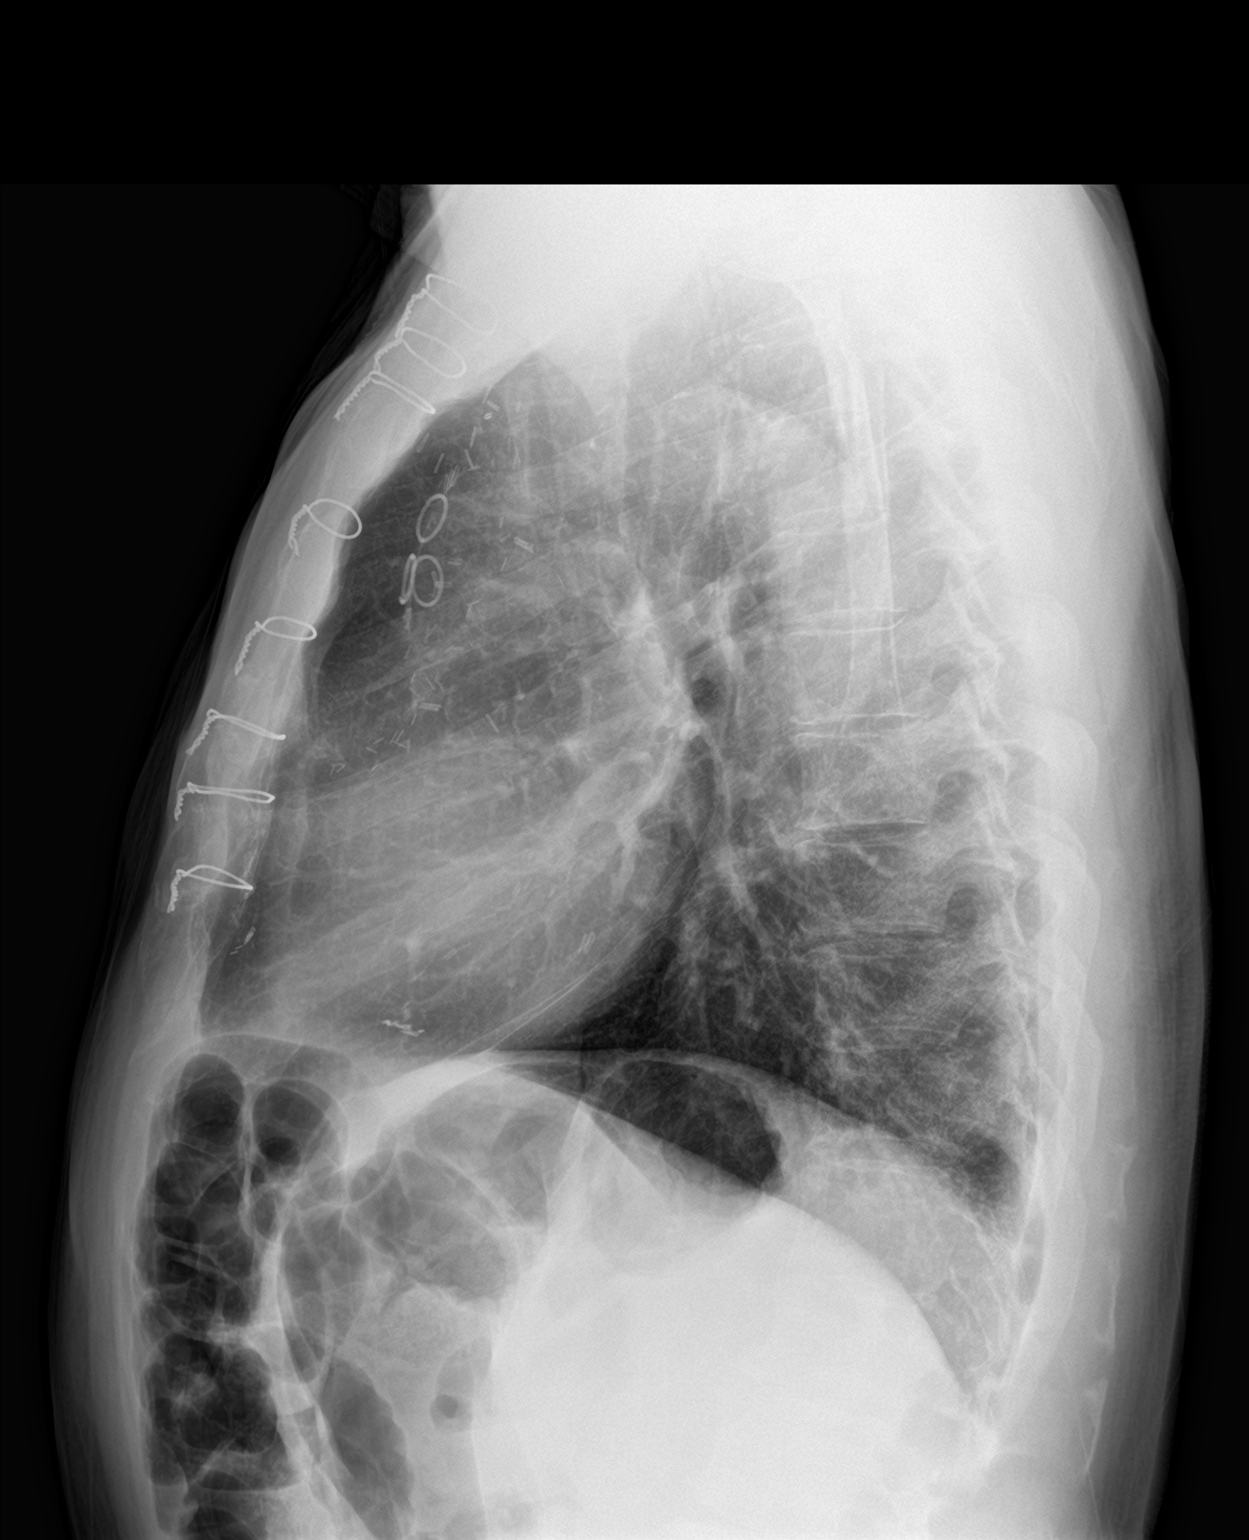

[2 of 2 positions shown; findings below may reference images not displayed]

FINDINGS: The lungs are well-expanded and clear. The heart and pulmonary
vascularity are normal. There are post CABG changes. There is
calcification in the wall of the aortic arch. There is no pleural
effusion or pneumothorax. The bony thorax exhibits no acute
abnormalities.
IMPRESSION: Mild chronic bronchitic-smoking related changes, stable. No
pneumonia, CHF, nor other acute cardiopulmonary abnormality.

Thoracic aortic atherosclerosis.

## 2018-09-23 ENCOUNTER — Ambulatory Visit: Payer: Medicare Other | Admitting: Neurology

## 2018-09-23 ENCOUNTER — Encounter: Payer: Self-pay | Admitting: Podiatry

## 2018-09-23 ENCOUNTER — Ambulatory Visit (INDEPENDENT_AMBULATORY_CARE_PROVIDER_SITE_OTHER): Payer: Medicare Other | Admitting: Podiatry

## 2018-09-23 ENCOUNTER — Other Ambulatory Visit: Payer: Self-pay

## 2018-09-23 VITALS — Temp 97.5°F

## 2018-09-23 DIAGNOSIS — I2510 Atherosclerotic heart disease of native coronary artery without angina pectoris: Secondary | ICD-10-CM

## 2018-09-23 DIAGNOSIS — M79676 Pain in unspecified toe(s): Secondary | ICD-10-CM

## 2018-09-23 DIAGNOSIS — M2010 Hallux valgus (acquired), unspecified foot: Secondary | ICD-10-CM | POA: Diagnosis not present

## 2018-09-23 DIAGNOSIS — B351 Tinea unguium: Secondary | ICD-10-CM | POA: Diagnosis not present

## 2018-09-23 DIAGNOSIS — D689 Coagulation defect, unspecified: Secondary | ICD-10-CM

## 2018-09-23 DIAGNOSIS — I739 Peripheral vascular disease, unspecified: Secondary | ICD-10-CM

## 2018-09-23 NOTE — Progress Notes (Signed)
Complaint:  Visit Type: Patient returns to my office for continued preventative foot care services. Complaint: Patient states" my nails have grown long and thick and become painful to walk and wear shoes" Patient has been diagnosed with peripheral arterial disease.. The patient presents for preventative foot care services. No changes to ROS.  Patient is taking plavix.  Podiatric Exam: Vascular: dorsalis pedis and posterior tibial pulses are palpable bilateral. Capillary return is immediate. Temperature gradient is WNL. Skin turgor WNL  Sensorium: Normal Semmes Weinstein monofilament test. Normal tactile sensation bilaterally. Nail Exam: Pt has thick disfigured discolored nails with subungual debris noted bilateral entire nail hallux through fifth toenails Ulcer Exam: There is no evidence of ulcer or pre-ulcerative changes or infection. Orthopedic Exam: Muscle tone and strength are WNL. No limitations in general ROM. No crepitus or effusions noted. Foot type and digits show no abnormalities. HAV 1st MPJ  B/L. Skin: No Porokeratosis. No infection or ulcers  Diagnosis:  Onychomycosis, , Pain in right toe, pain in left toes  HAV  B/L.  Treatment & Plan Procedures and Treatment: Consent by patient was obtained for treatment procedures.   Debridement of mycotic and hypertrophic toenails, 1 through 5 bilateral and clearing of subungual debris. No ulceration, no infection noted.  Prescribe topical pain medication to apply to painful  Bunion left foot. Return Visit-Office Procedure: Patient instructed to return to the office for a follow up visit 3 months for continued evaluation and treatment.    Gardiner Barefoot DPM

## 2018-10-23 ENCOUNTER — Encounter: Payer: Self-pay | Admitting: Neurology

## 2018-10-24 ENCOUNTER — Telehealth (INDEPENDENT_AMBULATORY_CARE_PROVIDER_SITE_OTHER): Payer: Medicare Other | Admitting: Neurology

## 2018-10-24 ENCOUNTER — Other Ambulatory Visit: Payer: Self-pay

## 2018-10-24 VITALS — Ht 68.0 in | Wt 150.0 lb

## 2018-10-24 DIAGNOSIS — F03B18 Unspecified dementia, moderate, with other behavioral disturbance: Secondary | ICD-10-CM

## 2018-10-24 DIAGNOSIS — F0391 Unspecified dementia with behavioral disturbance: Secondary | ICD-10-CM | POA: Diagnosis not present

## 2018-10-24 NOTE — Progress Notes (Signed)
Virtual Visit via Video Note The purpose of this virtual visit is to provide medical care while limiting exposure to the novel coronavirus.    Consent was obtained for video visit:  Yes.   Answered questions that patient had about telehealth interaction:  Yes.   I discussed the limitations, risks, security and privacy concerns of performing an evaluation and management service by telemedicine. I also discussed with the patient that there may be a patient responsible charge related to this service. The patient expressed understanding and agreed to proceed.  Pt location: Home Physician Location: office Name of referring provider:  Lance Sell, NP I connected with [name] [name] at patients initiation/request on 10/24/2018 at 10:30 AM EDT by video enabled telemedicine application and verified that I am speaking with the correct person using two identifiers. Pt MRN:  [id number] Pt DOB:  1933/04/10 Video Participants:  [name] [name];  [name] Wolven-Jeter (daughter)   History of Present Illness:  This is a pleasant 85 year old right-handed man with a history of diet-controlled hypertension, hyperlipidemia, CAD, presenting for evaluation of memory loss. His daughter [name] is present during the e-visit to provide additional information. He states his memory is "not too good, not too bad." Candy reports that memory issues came to a head when he got lost driving 2 years ago, he was supposed to go to the grocery and ended up in [place] then was on the other side of the highway coming back to [place]. He stopped driving at that time and Candy moved in to help him. She reports he had been pretty good with his medications prior to her moving in, but she had noticed he had been struggling with them over the years and had requested weaning him off all medications, he has been off all his medications for 4 months with no issues. [name] has been managing finances over the past 2 years as well. She reports he  misplaces things frequently and has paranoia that someone is taking them. He lost his razor and shaving cream and they still have not found them. He lost his wallet one time and found it in the car. He does not cook. He is independent with dressing and bathing, he can do his own laundry and operate the washing machine but it takes him a longer time. Around 3-6 months ago, he started having auditory hallucinations, one time at midnight he heard a lady calling and he went outside. He was started on Donepezil which his daughter reports made symptoms worse, he was having nightmares. He has not mentioned any further hallucinations to his daughter, but she can tell he is struggling with it around every other week where he would be talking to the TV more, thinking they are waving at him.   He denies any headaches, diplopia, dysarthria, neck pain, focal weakness. He has occasional dizziness, problems swallowing, back pain, and right hand numbness. He has occasional constipation. He has noticed a reduced sense of smell the past few months. He has occasional hand tremors that do not affect writing or using utensils. His daughter started noticing a mild head tremor 3 years ago. His daughter reports his balance is off, which makes him skittish. His last fall was 2 years ago, he fell backward on a bench. She is concerned about poor weight gain, initially attributed to living alone and forgetting to eat, but even with Candy cooking and giving Ensure, he is eating fine but not gaining much weight.   I  personally reviewed MRI brain without contrast done 05/2018 which did not show any acute changes, there was moderate diffuse atrophy, minimal chronic microvascular disease.  Laboratory Data: Lab Results  Component Value Date   TSH 0.91 09/05/2018   Lab Results  Component Value Date   VITAMINB12 460 05/02/2018    PAST MEDICAL HISTORY: Past Medical History:  Diagnosis Date   Anxiety    Arthritis    Carotid artery  disease ([place])    Coronary artery disease    sees Dr. Verl Blalock   Depression    DJD (degenerative joint disease)    right hip   GERD (gastroesophageal reflux disease)    hx of   Glaucoma    Glucose intolerance (impaired glucose tolerance)    Headache(784.0)    hx of migraines   Herpes zoster 2009   r. scalp and opth   Hx of colonic polyps 2004   hyperplastic   Hyperlipidemia    Hypertension    sees Dr. Adelene Amas   Internal and external hemorrhoids without complication    Low back pain    facet arthropathy   Lumbago    Peripheral vascular disease ([place])    Pneumonia 2009   rll with a small effusion   Postherpetic neuralgia 2009    PAST SURGICAL HISTORY: Past Surgical History:  Procedure Laterality Date   ACNE CYST REMOVAL  1970's   removed x2 off the back   CARPAL TUNNEL RELEASE     left   CATARACT EXTRACTION W/PHACO  02/13/2012   Procedure: CATARACT EXTRACTION PHACO AND INTRAOCULAR LENS PLACEMENT ([place]);  Surgeon: Adonis Brook, MD;  Location: [facility];  Service: Ophthalmology;  Laterality: Left;   COLONOSCOPY     CORONARY ARTERY BYPASS GRAFT  2002   REPOSITION OF LENS  03/14/2012   Procedure: REPOSITION OF LENS;  Surgeon: Adonis Brook, MD;  Location: [facility];  Service: Ophthalmology;  Laterality: Left;  Reposition Ocular Lens Left Eye   thumb tendon surgery     left   TONSILLECTOMY     age 85    MEDICATIONS: Current Outpatient Medications on File Prior to Visit  Medication Sig Dispense Refill   Cholecalciferol (VITAMIN D3) 1000 UNITS CAPS Take 1 capsule by mouth daily.       cycloSPORINE (RESTASIS) 0.05 % ophthalmic emulsion Place 1 drop into both eyes 2 (two) times daily.     fluticasone (FLONASE) 50 MCG/ACT nasal spray      NONFORMULARY OR COMPOUNDED ITEM Topical anesthetic was sent to [facility] 0.004 % SOLN ophthalmic solution Place 1 drop into both eyes daily.     No current facility-administered medications on  file prior to visit.     ALLERGIES: Allergies  Allergen Reactions   Atorvastatin Other (See Comments)    Muscle aches Muscle aches Muscle aches   Benazepril Hcl Cough   Cosopt [Dorzolamide Hcl-Timolol Mal] Other (See Comments)    Eyes water   Ezetimibe-Simvastatin Other (See Comments)    REACTION: aches REACTION: aches REACTION: aches   Aricept [Donepezil Hcl]     hallucinations   Norvasc [Amlodipine Besylate]     swelling    FAMILY HISTORY: Family History  Problem Relation Age of Onset   Breast cancer Mother    Pulmonary embolism Father    Colon cancer Neg Hx    Esophageal cancer Neg Hx      Current Outpatient Medications on File Prior to Visit  Medication Sig Dispense Refill  Cholecalciferol (VITAMIN D3) 1000 UNITS CAPS Take 1 capsule by mouth daily.       cycloSPORINE (RESTASIS) 0.05 % ophthalmic emulsion Place 1 drop into both eyes 2 (two) times daily.     fluticasone (FLONASE) 50 MCG/ACT nasal spray      NONFORMULARY OR COMPOUNDED ITEM Topical anesthetic was sent to [facility] 0.004 % SOLN ophthalmic solution Place 1 drop into both eyes daily.     No current facility-administered medications on file prior to visit.      Observations/Objective:   Vitals:   10/23/18 0846  Weight: 150 lb (68 kg)  Height: 5\' 8"  (1.727 m)   GEN:  The patient appears stated age and is in NAD. No facial hypomimia.  Neurological examination: Patient is awake, alert, oriented x 3. No aphasia or dysarthria. Intact fluency and comprehension. Remote and recent memory impaired. Able to name and repeat. Cranial nerves: Extraocular movements intact with no nystagmus. No facial asymmetry.  Motor: moves all extremities symmetrically, at least anti-gravity x 4. No incoordination on finger to nose testing. Gait: slow and cautious, no ataxia, good arm swing. Good finger taps and rapid alternating movements. He has a mild side to side head tremor, no  resting/postural/action tremor seen.  Assessment and Plan:   This is a pleasant 85 year old right-handed man with diet-controlled hypertension, hyperlipidemia, CAD, presenting for evaluation of memory loss. He and his daughter opted to have memory testing done in a future visit. He has a side to side head tremor, but no other visible Parkinsonian signs on video (note of limited exam due to nature of video visit). MRI brain showed moderate diffuse atrophy, minimal chronic microvascular disease. I discussed the diagnosis of mild to moderate dementia, likely due to Alzheimer's disease. He had side effects to Donepezil, his daughter is not interested in medication at this time. We discussed behavioral changes that occur with dementia, hallucinations have not been bothersome, continue to monitor. Continue 24/7 supervision. He does not drive. We discussed the importance of control of vascular risk factors, physical exercise, and brain stimulation exercises for brain health. Follow-up in 6 months, they know to call for any changes.   Follow Up Instructions:   -I discussed the assessment and treatment plan with the patient/daughter. The patient/daughter were provided an opportunity to ask questions and all were answered. The patient/daughter agreed with the plan and demonstrated an understanding of the instructions.   The patient/daughter were advised to call back or seek an in-person evaluation if the symptoms worsen or if the condition fails to improve as anticipated.   Cameron Sprang, MD

## 2018-10-29 ENCOUNTER — Ambulatory Visit
Admission: RE | Admit: 2018-10-29 | Discharge: 2018-10-29 | Disposition: A | Discharge status: Home / Self Care | Payer: Medicare Other | Source: Ambulatory Visit | Attending: Internal Medicine | Admitting: Internal Medicine

## 2018-10-29 ENCOUNTER — Other Ambulatory Visit: Payer: Self-pay

## 2018-10-29 ENCOUNTER — Encounter: Payer: Medicare Other | Attending: Internal Medicine | Admitting: Internal Medicine

## 2018-10-29 ENCOUNTER — Other Ambulatory Visit: Payer: Self-pay | Admitting: Internal Medicine

## 2018-10-29 DIAGNOSIS — B999 Unspecified infectious disease: Secondary | ICD-10-CM | POA: Insufficient documentation

## 2018-10-29 DIAGNOSIS — Z951 Presence of aortocoronary bypass graft: Secondary | ICD-10-CM | POA: Insufficient documentation

## 2018-10-29 DIAGNOSIS — L89892 Pressure ulcer of other site, stage 2: Secondary | ICD-10-CM | POA: Diagnosis not present

## 2018-10-29 DIAGNOSIS — I872 Venous insufficiency (chronic) (peripheral): Secondary | ICD-10-CM | POA: Insufficient documentation

## 2018-10-29 DIAGNOSIS — L89891 Pressure ulcer of other site, stage 1: Secondary | ICD-10-CM | POA: Diagnosis not present

## 2018-10-29 DIAGNOSIS — I70202 Unspecified atherosclerosis of native arteries of extremities, left leg: Secondary | ICD-10-CM | POA: Insufficient documentation

## 2018-10-29 DIAGNOSIS — L89893 Pressure ulcer of other site, stage 3: Secondary | ICD-10-CM | POA: Insufficient documentation

## 2018-10-29 DIAGNOSIS — L89890 Pressure ulcer of other site, unstageable: Secondary | ICD-10-CM | POA: Diagnosis not present

## 2018-10-29 DIAGNOSIS — I10 Essential (primary) hypertension: Secondary | ICD-10-CM | POA: Insufficient documentation

## 2018-10-29 DIAGNOSIS — M19072 Primary osteoarthritis, left ankle and foot: Secondary | ICD-10-CM | POA: Diagnosis not present

## 2018-10-29 DIAGNOSIS — F0390 Unspecified dementia without behavioral disturbance: Secondary | ICD-10-CM | POA: Insufficient documentation

## 2018-10-29 DIAGNOSIS — I2510 Atherosclerotic heart disease of native coronary artery without angina pectoris: Secondary | ICD-10-CM | POA: Insufficient documentation

## 2018-10-29 DIAGNOSIS — E785 Hyperlipidemia, unspecified: Secondary | ICD-10-CM | POA: Insufficient documentation

## 2018-10-29 DIAGNOSIS — I739 Peripheral vascular disease, unspecified: Secondary | ICD-10-CM | POA: Diagnosis present

## 2018-10-29 NOTE — Progress Notes (Signed)
LIBERATO, STANSBERY ([id number]) Visit Report for 10/29/2018 Abuse/Suicide Risk Screen Details Patient Name: [name] [name] Date of Service: 10/29/2018 9:00 AM Medical Record Number: [id number] Patient Account Number: [id number] Date of Birth/Sex: January 13, 1933 (85 y.o. M) Treating RN: [name] [name] Primary Care [name] [name]: [name] [name] Other Clinician: Referring [name] [name]: [name] [name] Treating [name] [name]/Extender: [name] [name] in Treatment: 0 Abuse/Suicide Risk Screen Items Answer ABUSE RISK SCREEN: Has anyone close to you tried to hurt or harm you recentlyo No Do you feel uncomfortable with anyone in your familyo No Has anyone forced you do things that you didnot want to doo No Electronic Signature(s) Signed: 10/29/2018 3:56:42 PM By: [name] [name] Entered By: [name] [name] on 10/29/2018 09:03:36 [name] [name] ([id number]) -------------------------------------------------------------------------------- Activities of Daily Living Details Patient Name: [name] [name] Date of Service: 10/29/2018 9:00 AM Medical Record Number: [id number] Patient Account Number: [id number] Date of Birth/Sex: 1932/06/08 (85 y.o. M) Treating RN: [name] [name] Primary Care [name] [name]: [name] [name] Other Clinician: Referring [name] [name]: [name] [name] Treating [name] [name]/Extender: [name] [name] in Treatment: 0 Activities of Daily Living Items Answer Activities of Daily Living (Please select one for each item) Drive Automobile Not Able Take Medications Completely Able Use Telephone Completely Able Care for Appearance Completely Able Use Toilet Completely Able Bath / Shower Completely Able Dress Self Completely Able Feed Self Completely Able Walk Completely Able Get In / Out Bed Completely Able Housework Completely Able Prepare Meals Completely [place] Need Assistance Shop for Self Need Assistance Electronic Signature(s) Signed: 10/29/2018 3:56:42 PM By:  [name] [name] Entered By: [name] [name] on 10/29/2018 09:03:58 [name] [name] ([id number]) -------------------------------------------------------------------------------- Education Screening Details Patient Name: [name] [name] Date of Service: 10/29/2018 9:00 AM Medical Record Number: [id number] Patient Account Number: [id number] Date of Birth/Sex: 18-Mar-1933 (85 y.o. M) Treating RN: [name] [name] Primary Care [name] [name]: [name] [name] Other Clinician: Referring [name] [name]: [name] [name] Treating [name] [name]/Extender: [name] [name] in Treatment: 0 Primary Learner Assessed: Patient Learning Preferences/Education Level/Primary Language Learning Preference: Explanation Highest Education Level: College or Above Preferred Language: English Cognitive Barrier Language Barrier: No Translator Needed: No Memory Deficit: No Emotional Barrier: No Cultural/Religious Beliefs Affecting Medical Care: No Physical Barrier Impaired Vision: No Impaired Hearing: No Decreased Hand dexterity: No Knowledge/Comprehension Knowledge Level: High Comprehension Level: High Ability to understand written High instructions: Ability to understand verbal High instructions: Motivation Anxiety Level: Calm Cooperation: Cooperative Education Importance: Acknowledges Need Interest in Health Problems: Asks Questions Perception: Coherent Willingness to Engage in Self- High Management Activities: Readiness to Engage in Self- High Management Activities: Electronic Signature(s) Signed: 10/29/2018 3:56:42 PM By: [name] [name] Entered By: [name] [name] on 10/29/2018 09:04:25 [name] [name] ([id number]) -------------------------------------------------------------------------------- Fall Risk Assessment Details Patient Name: [name] [name] Date of Service: 10/29/2018 9:00 AM Medical Record Number: [id number] Patient Account Number: [id number] Date of Birth/Sex: [birth year] (85 y.o.  M) Treating RN: [name] [name] Primary Care [name] [name]: [name] [name] Other Clinician: Referring [name] [name]: [name] [name] Treating [name] [name]/Extender: [name] [name] in Treatment: 0 Fall Risk Assessment Items Have you had 2 or more falls in the last 12 monthso 0 No Have you had any fall that resulted in injury in the last 12 monthso 0 No FALLS RISK SCREEN History of falling - immediate or within 3 months 0 No Secondary diagnosis (Do you have 2 or more medical diagnoseso) 0 No Ambulatory aid None/bed rest/wheelchair/nurse 0 No Crutches/cane/walker 0 No Furniture 0 No Intravenous therapy Access/Saline/Heparin Lock 0 No Gait/Transferring Normal/ bed rest/ wheelchair 0 No Weak (  short steps with or without shuffle, stooped but able to lift head while 0 No walking, may seek support from furniture) Impaired (short steps with shuffle, may have difficulty arising from chair, head 0 No down, impaired balance) Mental Status Oriented to own ability 0 Yes Electronic Signature(s) Signed: 10/29/2018 3:56:42 PM By: [name] [name] Entered By: [name] [name] on 10/29/2018 09:04:44 [name] [name] ([id number]) -------------------------------------------------------------------------------- Foot Assessment Details Patient Name: [name] [name] Date of Service: 10/29/2018 9:00 AM Medical Record Number: [id number] Patient Account Number: [id number] Date of Birth/Sex: 1932/12/10 (85 y.o. M) Treating RN: [name] [name] Primary Care [name] [name]: [name] [name] Other Clinician: Referring [name] [name]: [name] [name] Treating [name] [name]/Extender: [name] [name] in Treatment: 0 Foot Assessment Items Site Locations + = Sensation present, - = Sensation absent, C = Callus, U = Ulcer R = Redness, W = Warmth, M = Maceration, PU = Pre-ulcerative lesion F = Fissure, S = Swelling, D = Dryness Assessment Right: Left: Other Deformity: No No Prior Foot Ulcer: No No Prior Amputation:  No No Charcot Joint: No No Ambulatory Status: Ambulatory Without Help Gait: Unsteady Electronic Signature(s) Signed: 10/29/2018 3:56:42 PM By: [name] [name] Entered By: [name] [name] on 10/29/2018 09:08:28 [name] [name] ([id number]) -------------------------------------------------------------------------------- Nutrition Risk Screening Details Patient Name: [name] [name] Date of Service: 10/29/2018 9:00 AM Medical Record Number: [id number] Patient Account Number: [id number] Date of Birth/Sex: 28-Jan-1933 (85 y.o. M) Treating RN: [name] [name] Primary Care Syndi Pua: [name] [name] Other Clinician: Referring Taffany Heiser: [name] [name] Treating Roza Creamer/Extender: [name] [name] in Treatment: 0 Height (in): 69 Weight (lbs): 150 Body Mass Index (BMI): 22.1 Nutrition Risk Screening Items Score Screening NUTRITION RISK SCREEN: I have an illness or condition that made me change the kind and/or amount of 0 No food I eat I eat fewer than two meals per day 0 No I eat few fruits and vegetables, or milk products 0 No I have three or more drinks of beer, liquor or wine almost every day 0 No I have tooth or mouth problems that make it hard for me to eat 0 No I don't always have enough money to buy the food I need 0 No I eat alone most of the time 0 No I take three or more different prescribed or over-the-counter drugs a day 1 Yes Without wanting to, I have lost or gained 10 pounds in the last six months 0 No I am not always physically able to shop, cook and/or feed myself 0 No Nutrition Protocols Good Risk Protocol Moderate Risk Protocol High Risk Proctocol Risk Level: Good Risk Score: 1 Electronic Signature(s) Signed: 10/29/2018 3:56:42 PM By: [name] [name] Entered By: [name] [name] on 10/29/2018 [place]

## 2018-10-30 NOTE — Progress Notes (Addendum)
ARNOLDO, HILDRETH ([id number]) Visit Report for 10/29/2018 Chief Complaint Document Details Patient Name: DAWSEN, KRIEGER Date of Service: 10/29/2018 9:00 AM Medical Record Number: [id number] Patient Account Number: [id number] Date of Birth/Sex: 31-Mar-1933 (85 y.o. M) Treating RN: Cornell Barman Primary Care Provider: Lew Dawes Other Clinician: Referring Provider: Lew Dawes Treating Provider/Extender: Tito Dine in Treatment: 0 Information Obtained from: Patient Chief Complaint 10/29/2018; patient is here for wounds on predominantly his left second and left first toes Electronic Signature(s) Signed: 10/29/2018 4:31:45 PM By: Linton Ham MD Entered By: Linton Ham on 10/29/2018 10:10:33 [name] ([id number]) -------------------------------------------------------------------------------- Debridement Details Patient Name: [name] Date of Service: 10/29/2018 9:00 AM Medical Record Number: [id number] Patient Account Number: [id number] Date of Birth/Sex: Sep 08, 1932 (85 y.o. M) Treating RN: Cornell Barman Primary Care Provider: Lew Dawes Other Clinician: Referring Provider: Lew Dawes Treating Provider/Extender: Tito Dine in Treatment: 0 Debridement Performed for Wound #2 Left,Medial Toe Second Assessment: Performed By: Physician Ricard Dillon, MD Debridement Type: Debridement Level of Consciousness (Pre- Awake and Alert procedure): Pre-procedure Verification/Time Yes - 09:35 Out Taken: Start Time: 09:35 Pain Control: Lidocaine Total Area Debrided (L x W): 1 (cm) x 1.5 (cm) = 1.5 (cm) Tissue and other material Viable, Non-Viable, Slough, Subcutaneous, Slough debrided: Level: Skin/Subcutaneous Tissue Debridement Description: Excisional Instrument: Curette Bleeding: Minimum Hemostasis Achieved: Pressure End Time: 09:39 Response to Treatment: Procedure was tolerated well Level of Consciousness Awake  and Alert (Post-procedure): Post Debridement Measurements of Total Wound Length: (cm) 1 Width: (cm) 1.5 Depth: (cm) 0.4 Volume: (cm) 0.471 Character of Wound/Ulcer Post Debridement: Stable Post Procedure Diagnosis Same as Pre-procedure Electronic Signature(s) Signed: 10/29/2018 4:20:22 PM By: Gretta Cool, BSN, RN, CWS, Kim RN, BSN Signed: 10/29/2018 4:31:45 PM By: Linton Ham MD Entered By: Linton Ham on 10/29/2018 10:07:52 [name] ([id number]) -------------------------------------------------------------------------------- HPI Details Patient Name: [name] Date of Service: 10/29/2018 9:00 AM Medical Record Number: [id number] Patient Account Number: [id number] Date of Birth/Sex: Nov 27, 1932 (85 y.o. M) Treating RN: Cornell Barman Primary Care Provider: Lew Dawes Other Clinician: Referring Provider: Lew Dawes Treating Provider/Extender: Tito Dine in Treatment: 0 History of Present Illness HPI Description: Lady Maleki 12/2017 This is an 85 year old man who is accompanied by his daughter today. They're largely here for concerns for discoloration in his bilateral lower legs. This is apparently been getting worse over the last year and is more extensive on the right lower leg. He also developed swelling of his legs but no real pain. He had DVT rule out studies in May of this year that were negative bilaterally. The patient has not had reflux studies. He does wear compression stockings although we are not exactly sure what strength compression. Recently his primary care doctor told the patient not to wear these for bit of time and so they are wearing them currently. Looking through [facility] link the patient had arterial studies done in 2015. This showed aortoiliac disease and a greater than 650% right SFA stenosis. He had 3 vessel runoff. He was reviewed by Dr. Fletcher Anon in March 2016 at that point he quoted a right ABI of 0.77 and 0.86 on the  left. There was evidence of inflow disease based on waveforms. Duplex showed significant bilateral SFA disease. As he was reasonably asymptomatic it was recommended he have medical therapy. That was based on the absence of claudication. Currently the patient states he can walk around the block and walks around with his daughter in department stores etc. He  does not describe claudication does not seem to be limited by any way I can determine. The patient is not a diabetic. He has a remote coronary artery bypass that used to vein out of the right leg. He is on Lasix. ABIs in our clinic were 0.51 on the right and 0.71 on the left ADMISSION 10/29/2018 This is a patient I saw 1 time at [facility] health wound care center in August 2019. At that point he did not have any open wounds. He does have known PAD and chronic venous insufficiency. He is not a diabetic and currently does not describe claudication-like symptoms. He has not followed up with his arterial disease as far as I can tell. Over the last several months they have noticed wound on the medial aspect of the left second toe and more recently on the lateral aspect of the first toe in the webspace. They had a compound medication that included lidocaine, diclofenac cream and another part of this prescribed although they think that this made this worse. This was apparently for this prescribed by podiatry in [place] although I have no information on this. Importantly the patient does not describe pain or pruritus in his feet. Before the wounds were noticed he was walking 2 blocks around his home with his family without any claudication limiting symptoms. Past medical history includes moderate dementia, sciatica, onychomycosis, chronic venous insufficiency, glaucoma, coronary artery disease, hypertension, hyperlipidemia. Remote CABG. He also has PAD [please see my notes from [facility] 8/19 included above] ABIs in our clinic were 0.72 on the  right 0.86 on the left. These are actually stable to improved to what we recorded last year Electronic Signature(s) Signed: 10/29/2018 4:31:45 PM By: Linton Ham MD Entered By: Linton Ham on 10/29/2018 10:14:01 [name] ([id number]) -------------------------------------------------------------------------------- Physical Exam Details Patient Name: [name] Date of Service: 10/29/2018 9:00 AM Medical Record Number: [id number] Patient Account Number: [id number] Date of Birth/Sex: Dec 15, 1932 (85 y.o. M) Treating RN: Cornell Barman Primary Care Provider: Lew Dawes Other Clinician: Referring Provider: Lew Dawes Treating Provider/Extender: Tito Dine in Treatment: 0 Constitutional Patient is hypertensive.. Pulse regular and within target range for patient.Marland Kitchen Respirations regular, non-labored and within target range.. Temperature is normal and within the target range for the patient.Marland Kitchen appears in no distress. Respiratory Respiratory effort is easy and symmetric bilaterally. Rate is normal at rest and on room air.. Bilateral breath sounds are clear and equal in all lobes with no wheezes, rales or rhonchi.. Cardiovascular 3 out of 6 midsystolic murmur that does not radiate. Lymphatic None palpable in the popliteal or inguinal area. Musculoskeletal No tenderness over the inner phalangeal joint on the left first toe over the PIP of the left second toe. No overt effusion. Integumentary (Hair, Skin) Changes of chronic venous insufficiency mild edema. He has what appears to be possibly tinea between the fourth and fifth toes on the right and the first and second toes on the right. Psychiatric No evidence of depression, anxiety, or agitation. Calm, cooperative, and communicative. Appropriate interactions and affect.. Notes Wound exam; the major problem is between the left first and second toes. He has a deep punched out area on the PIP of the left second  toe. Necrotic base that this removed with a #3 curette. Hemostasis with direct pressure. This does not go to bone however it is deep. He has a blistered area on the lateral part of the first toe over the interphalangeal joint in the webspace. Concerning for  underlying deep tissue injury underneath this blistered skin Electronic Signature(s) Signed: 10/29/2018 4:31:45 PM By: Linton Ham MD Entered By: Linton Ham on 10/29/2018 10:21:17 [name] ([id number]) -------------------------------------------------------------------------------- Physician Orders Details Patient Name: [name] Date of Service: 10/29/2018 9:00 AM Medical Record Number: [id number] Patient Account Number: [id number] Date of Birth/Sex: 09/19/1932 (85 y.o. M) Treating RN: Cornell Barman Primary Care Provider: Lew Dawes Other Clinician: Referring Provider: Lew Dawes Treating Provider/Extender: Tito Dine in Treatment: 0 Verbal / Phone Orders: No Diagnosis Coding Wound Cleansing Wound #1 Left,Medial Toe Great o Dial antibacterial soap, wash wounds, rinse and pat dry prior to dressing wounds - clean between toes daily Wound #2 Left,Medial Toe Second o Dial antibacterial soap, wash wounds, rinse and pat dry prior to dressing wounds - clean between toes daily Wound #3 Right Toe Great o Dial antibacterial soap, wash wounds, rinse and pat dry prior to dressing wounds - clean between toes daily Wound #4 Right Toe Second o Dial antibacterial soap, wash wounds, rinse and pat dry prior to dressing wounds - clean between toes daily Anesthetic (add to Medication List) Wound #1 Left,Medial Toe Great o Topical Lidocaine 4% cream applied to wound bed prior to debridement (In Clinic Only). Wound #2 Left,Medial Toe Second o Topical Lidocaine 4% cream applied to wound bed prior to debridement (In Clinic Only). Primary Wound Dressing Wound #1 Left,Medial Toe Great o Silver  Alginate Wound #2 Left,Medial Toe Second o Silver Alginate Wound #3 Right Toe Great o Silver Alginate Wound #4 Right Toe Second o Silver Alginate Secondary Dressing Wound #1 Left,Medial Toe Great o Gauze and Kerlix/Conform - Lambs wool between toes Wound #2 Left,Medial Toe Second o Gauze and Kerlix/Conform - Lambs wool between toes Wound #3 Right Toe Great o Gauze and Kerlix/Conform - Lambs wool between toes Wound #4 Right Toe Second NICHOLLAS, PERUSSE ([id number]) o Gauze and Kerlix/Conform - Lambs wool between toes Dressing Change Frequency Wound #1 Left,Medial Toe Great o Change dressing every day. Wound #2 Left,Medial Toe Second o Change dressing every day. Wound #3 Right Toe Great o Change dressing every day. Wound #4 Right Toe Second o Change dressing every day. Follow-up Appointments Wound #1 Left,Medial Toe Great o Return Appointment in 1 week. Wound #2 Left,Medial Toe Second o Return Appointment in 1 week. Wound #3 Right Toe Great o Return Appointment in 1 week. Wound #4 Right Toe Second o Return Appointment in 1 week. Additional Orders / Instructions Wound #1 Left,Medial Toe Great o Activity as tolerated Wound #2 Left,Medial Toe Second o Activity as tolerated Wound #3 Right Toe Great o Activity as tolerated Wound #4 Right Toe Second o Activity as tolerated Radiology o X-ray, foot - Left with concentration on 2nd toe with wound Electronic Signature(s) Signed: 10/29/2018 4:20:22 PM By: Gretta Cool, BSN, RN, CWS, Kim RN, BSN Signed: 10/29/2018 4:31:45 PM By: Linton Ham MD Entered By: Gretta Cool, BSN, RN, CWS, Kim on 10/29/2018 13:00:36 DERON, POOLE ([id number]) -------------------------------------------------------------------------------- Problem List Details Patient Name: [name] Date of Service: 10/29/2018 9:00 AM Medical Record Number: [id number] Patient Account Number: [id number] Date of Birth/Sex: May 04, 1932 (85  y.o. M) Treating RN: Cornell Barman Primary Care Provider: Lew Dawes Other Clinician: Referring Provider: Lew Dawes Treating Provider/Extender: Tito Dine in Treatment: 0 Active Problems ICD-10 Evaluated Encounter Code Description Active Date Today Diagnosis L89.893 Pressure ulcer of other site, stage 3 10/29/2018 No Yes L89.890 Pressure ulcer of other site, unstageable 10/29/2018 No Yes I70.292 Other atherosclerosis of  native arteries of extremities, left leg 10/29/2018 No Yes Inactive Problems Resolved Problems Electronic Signature(s) Signed: 10/29/2018 4:31:45 PM By: Linton Ham MD Entered By: Linton Ham on 10/29/2018 10:07:19 [name] ([id number]) -------------------------------------------------------------------------------- Progress Note Details Patient Name: [name] Date of Service: 10/29/2018 9:00 AM Medical Record Number: [id number] Patient Account Number: [id number] Date of Birth/Sex: 1932-11-26 (85 y.o. M) Treating RN: Cornell Barman Primary Care Provider: Lew Dawes Other Clinician: Referring Provider: Lew Dawes Treating Provider/Extender: Tito Dine in Treatment: 0 Subjective Chief Complaint Information obtained from Patient 10/29/2018; patient is here for wounds on predominantly his left second and left first toes History of Present Illness (HPI) Lady Cobie 12/2017 This is an 85 year old man who is accompanied by his daughter today. They're largely here for concerns for discoloration in his bilateral lower legs. This is apparently been getting worse over the last year and is more extensive on the right lower leg. He also developed swelling of his legs but no real pain. He had DVT rule out studies in May of this year that were negative bilaterally. The patient has not had reflux studies. He does wear compression stockings although we are not exactly sure what strength compression. Recently his  primary care doctor told the patient not to wear these for bit of time and so they are wearing them currently. Looking through [facility] link the patient had arterial studies done in 2015. This showed aortoiliac disease and a greater than 650% right SFA stenosis. He had 3 vessel runoff. He was reviewed by Dr. Fletcher Anon in March 2016 at that point he quoted a right ABI of 0.77 and 0.86 on the left. There was evidence of inflow disease based on waveforms. Duplex showed significant bilateral SFA disease. As he was reasonably asymptomatic it was recommended he have medical therapy. That was based on the absence of claudication. Currently the patient states he can walk around the block and walks around with his daughter in department stores etc. He does not describe claudication does not seem to be limited by any way I can determine. The patient is not a diabetic. He has a remote coronary artery bypass that used to vein out of the right leg. He is on Lasix. ABIs in our clinic were 0.51 on the right and 0.71 on the left ADMISSION 10/29/2018 This is a patient I saw 1 time at [facility] health wound care center in August 2019. At that point he did not have any open wounds. He does have known PAD and chronic venous insufficiency. He is not a diabetic and currently does not describe claudication-like symptoms. He has not followed up with his arterial disease as far as I can tell. Over the last several months they have noticed wound on the medial aspect of the left second toe and more recently on the lateral aspect of the first toe in the webspace. They had a compound medication that included lidocaine, diclofenac cream and another part of this prescribed although they think that this made this worse. This was apparently for this prescribed by podiatry in [place] although I have no information on this. Importantly the patient does not describe pain or pruritus in his feet. Before the wounds were noticed he was  walking 2 blocks around his home with his family without any claudication limiting symptoms. Past medical history includes moderate dementia, sciatica, onychomycosis, chronic venous insufficiency, glaucoma, coronary artery disease, hypertension, hyperlipidemia. Remote CABG. He also has PAD [please see my notes from [facility]  8/19 included above] ABIs in our clinic were 0.72 on the right 0.86 on the left. These are actually stable to improved to what we recorded last year JEMIAH, ELLENBURG ([id number]) Patient History Information obtained from Patient. Allergies No Known Drug Allergies Family History Cancer - Mother, No family history of Diabetes, Heart Disease, Hereditary Spherocytosis, Hypertension, Kidney Disease, Lung Disease, Seizures, Stroke, Thyroid Problems, Tuberculosis. Social History Former smoker - 40 years ago, Marital Status - Widowed, Alcohol Use - Never, Drug Use - No History, Caffeine Use - Moderate. Medical History Eyes Patient has history of Cataracts - removed, Glaucoma - pressure Denies history of Optic Neuritis Ear/Nose/Mouth/Throat Denies history of Chronic sinus problems/congestion, Middle ear problems Hematologic/Lymphatic Denies history of Anemia, Hemophilia, Human Immunodeficiency Virus, Lymphedema, Sickle Cell Disease Respiratory Denies history of Aspiration, Asthma, Chronic Obstructive Pulmonary Disease (COPD), Pneumothorax, Sleep Apnea, Tuberculosis Cardiovascular Patient has history of Peripheral Arterial Disease Gastrointestinal Denies history of Cirrhosis , Colitis, Crohn s, Hepatitis A, Hepatitis B, Hepatitis C Endocrine Denies history of Type I Diabetes, Type II Diabetes Genitourinary Denies history of End Stage Renal Disease Immunological Denies history of Lupus Erythematosus, Raynaud s, Scleroderma Integumentary (Skin) Denies history of History of Burn, History of pressure wounds Musculoskeletal Patient has history of Gout - toe  great Neurologic Denies history of Dementia, Neuropathy, Quadriplegia, Seizure Disorder Oncologic Denies history of Received Chemotherapy, Received Radiation Psychiatric Denies history of Anorexia/bulimia, Confinement Anxiety Review of Systems (ROS) Constitutional Symptoms (General Health) Denies complaints or symptoms of Fatigue, Fever, Chills, Marked Weight Change. Eyes Complains or has symptoms of Glasses / Contacts. Denies complaints or symptoms of Dry Eyes, Vision Changes. Ear/Nose/Mouth/Throat Denies complaints or symptoms of Difficult clearing ears, Sinusitis. Hematologic/Lymphatic Denies complaints or symptoms of Bleeding / Clotting Disorders, Human Immunodeficiency Virus. Respiratory Denies complaints or symptoms of Chronic or frequent coughs, Shortness of Breath. CARLIS, BLANCHARD ([id number]) Cardiovascular Denies complaints or symptoms of Chest pain, LE edema. Gastrointestinal Denies complaints or symptoms of Frequent diarrhea, Nausea, Vomiting. Endocrine Denies complaints or symptoms of Hepatitis, Thyroid disease, Polydypsia (Excessive Thirst). Genitourinary Denies complaints or symptoms of Kidney failure/ Dialysis, Incontinence/dribbling. Immunological Denies complaints or symptoms of Hives, Itching. Integumentary (Skin) Complains or has symptoms of Wounds - 4. Denies complaints or symptoms of Bleeding or bruising tendency, Breakdown, Swelling. Musculoskeletal Denies complaints or symptoms of Muscle Pain, Muscle Weakness. Neurologic Denies complaints or symptoms of Numbness/parasthesias, Focal/Weakness. Psychiatric Denies complaints or symptoms of Anxiety, Claustrophobia. Objective Constitutional Patient is hypertensive.. Pulse regular and within target range for patient.Marland Kitchen Respirations regular, non-labored and within target range.. Temperature is normal and within the target range for the patient.Marland Kitchen appears in no distress. Vitals Time Taken: 8:50 AM, Height: 69  in, Source: Stated, Weight: 150 lbs, Source: Stated, BMI: 22.1, Temperature: 98.6 F, Pulse: 72 bpm, Respiratory Rate: 18 breaths/min, Blood Pressure: 167/57 mmHg. Respiratory Respiratory effort is easy and symmetric bilaterally. Rate is normal at rest and on room air.. Bilateral breath sounds are clear and equal in all lobes with no wheezes, rales or rhonchi.. Cardiovascular 3 out of 6 midsystolic murmur that does not radiate. Lymphatic None palpable in the popliteal or inguinal area. Musculoskeletal No tenderness over the inner phalangeal joint on the left first toe over the PIP of the left second toe. No overt effusion. Psychiatric No evidence of depression, anxiety, or agitation. Calm, cooperative, and communicative. Appropriate interactions and affect.. General Notes: Wound exam; the major problem is between the left first and second toes. He has a deep punched out area  on the PIP of the left second toe. Necrotic base that this removed with a #3 curette. Hemostasis with direct pressure. This does not go to bone however it is deep. He has a blistered area on the lateral part of the first toe over the interphalangeal joint in the webspace. Concerning for underlying deep tissue injury underneath this blistered skin LEVERN, PITTER. ([id number]) Integumentary (Hair, Skin) Changes of chronic venous insufficiency mild edema. He has what appears to be possibly tinea between the fourth and fifth toes on the right and the first and second toes on the right. Wound #1 status is Open. Original cause of wound was Gradually Appeared. Pressure Unstagable. The wound is located on the [employer]. The wound measures 1cm length x 1.5cm width x 0.1cm depth; 1.178cm^2 area and 0.118cm^3 volume. There is Fat Layer (Subcutaneous Tissue) Exposed exposed. There is no tunneling or undermining noted. There is a medium amount of serous drainage noted. The wound margin is flat and intact. There is medium  (34-66%) pink granulation within the wound bed. There is a small (1-33%) amount of necrotic tissue within the wound bed including Adherent Slough. Wound #2 status is Open. Original cause of wound was Gradually Appeared. Pressure Stage III.The wound is located on the Left,Medial Toe Second. The wound measures 1cm length x 1.5cm width x 0.4cm depth; 1.178cm^2 area and 0.471cm^3 volume. There is Fat Layer (Subcutaneous Tissue) Exposed exposed. There is no tunneling or undermining noted. There is a medium amount of serous drainage noted. The wound margin is flat and intact. There is medium (34-66%) pink granulation within the wound bed. There is a small (1-33%) amount of necrotic tissue within the wound bed including Adherent Slough. Wound #3 status is Open. Original cause of wound was Gradually Appeared. Pressure Stage II. The wound is located on the Right Toe Great. The wound measures 0.7cm length x 0.4cm width x 0.1cm depth; 0.22cm^2 area and 0.022cm^3 volume. The wound is limited to skin breakdown. There is no tunneling or undermining noted. There is a medium amount of serous drainage noted. The wound margin is flat and intact. There is medium (34-66%) pink granulation within the wound bed. There is a small (1-33%) amount of necrotic tissue within the wound bed. Wound #4 status is Open. Original cause of wound was Gradually Appeared. Pressure Stage I. The wound is located on the Right Toe Second. The wound measures 0.2cm length x 0.2cm width x 0.1cm depth; 0.031cm^2 area and 0.003cm^3 volume. The wound is limited to skin breakdown. There is no tunneling or undermining noted. There is a none present amount of drainage noted. The wound margin is flat and intact. There is no granulation within the wound bed. There is no necrotic tissue within the wound bed. Assessment Active Problems ICD-10 Pressure ulcer of other site, stage 3 Pressure ulcer of other site, unstageable Other atherosclerosis of  native arteries of extremities, left leg Procedures Wound #2 Pre-procedure diagnosis of Wound #2 is a Pressure Ulcer located on the Left,Medial Toe Second . There was a Excisional Skin/Subcutaneous Tissue Debridement with a total area of 1.5 sq cm performed by Ricard Dillon, MD. With the following instrument(s): Curette to remove Viable and Non-Viable tissue/material. Material removed includes Subcutaneous Tissue and Slough and after achieving pain control using Lidocaine. No specimens were taken. A time out was conducted at 09:35, prior to the start of the procedure. A Minimum amount of bleeding was controlled with Pressure. The procedure was tolerated well. Post Debridement  Measurements: 1cm length x 1.5cm width x 0.4cm depth; 0.471cm^3 volume. Character of Wound/Ulcer Post Debridement is stable. Post procedure Diagnosis Wound #2: Same as Pre-Procedure AVIEL, DAVALOS ([id number]) Plan Wound Cleansing: Wound #1 Left,Medial Toe Great: Dial antibacterial soap, wash wounds, rinse and pat dry prior to dressing wounds - clean between toes daily Wound #2 Left,Medial Toe Second: Dial antibacterial soap, wash wounds, rinse and pat dry prior to dressing wounds - clean between toes daily Wound #3 Right Toe Great: Dial antibacterial soap, wash wounds, rinse and pat dry prior to dressing wounds - clean between toes daily Wound #4 Right Toe Second: Dial antibacterial soap, wash wounds, rinse and pat dry prior to dressing wounds - clean between toes daily Anesthetic (add to Medication List): Wound #1 Left,Medial Toe Great: Topical Lidocaine 4% cream applied to wound bed prior to debridement (In Clinic Only). Wound #2 Left,Medial Toe Second: Topical Lidocaine 4% cream applied to wound bed prior to debridement (In Clinic Only). Primary Wound Dressing: Wound #1 Left,Medial Toe Great: Silver Alginate Wound #2 Left,Medial Toe Second: Silver Alginate Wound #3 Right Toe Great: Silver Alginate Wound  #4 Right Toe Second: Silver Alginate Secondary Dressing: Wound #1 Left,Medial Toe Great: Gauze and Kerlix/Conform - Lambs wool between toes Wound #2 Left,Medial Toe Second: Gauze and Kerlix/Conform - Lambs wool between toes Wound #3 Right Toe Great: Gauze and Kerlix/Conform - Lambs wool between toes Wound #4 Right Toe Second: Gauze and Kerlix/Conform - Lambs wool between toes Dressing Change Frequency: Wound #1 Left,Medial Toe Great: Change dressing every day. Wound #2 Left,Medial Toe Second: Change dressing every day. Wound #3 Right Toe Great: Change dressing every day. Wound #4 Right Toe Second: Change dressing every day. Follow-up Appointments: Wound #1 Left,Medial Toe Great: Return Appointment in 1 week. Wound #2 Left,Medial Toe Second: Return Appointment in 1 week. Wound #3 Right Toe Great: Return Appointment in 1 week. Wound #4 Right Toe Second: Return Appointment in 1 week. Additional Orders / Instructions: Wound #1 Left,Medial Toe Great: Activity as tolerated Wound #2 Left,Medial Toe Second: ARIUS, HARNOIS ([id number]) Activity as tolerated Wound #3 Right Toe Great: Activity as tolerated Wound #4 Right Toe Second: Activity as tolerated Radiology ordered were: X-ray, foot - Left with concentration on 2nd toe with wound 1. I am going to use silver alginate on the areas between the left first and second toe webspace wounds. These will need to be kept apart. I think this is all pressure related in this space. I did not see a lot of evidence of tinea at least in this webspace. He is going to need the use of wider forefoot shoes as his toes are pushed together like jigsaw puzzle pieces. In fact the wounds on the left first and second toe are clearly rubbing against each other when he is up and his foot wear. 2. The patient has a history of tinea but I did not see any in the major involved spaces however on the right fourth and fifth first and second that may be  possible. He apparently has a history of tinea pedis they are going to treat the areas between the right fourth and fifth first and second. I will have a look at this again next week. I did not think he needed any oral antifungals 3. The patient has a history of PAD which might of been centrally aortoiliac looking at his arterial studies from 2015. However just like last year when I saw him in the clinic in [place] in  August 3419 I cannot elicit anything that makes me's suspicious that he has claudication. He has some dementia and if these wounds deteriorate it might be worthwhile reconsidering the role arterial insufficiency is playing here. I could not feel his peripheral pulses in his feet although he did have reasonably stable ABIs as measured today. Electronic Signature(s) Signed: 11/14/2018 4:13:06 PM By: Gretta Cool, BSN, RN, CWS, Kim RN, BSN Signed: 11/16/2018 5:44:31 PM By: Linton Ham MD Previous Signature: 11/14/2018 4:11:23 PM Version By: Gretta Cool BSN, RN, CWS, Kim RN, BSN Previous Signature: 10/31/2018 5:31:43 PM Version By: Gretta Cool BSN, RN, CWS, Kim RN, BSN Previous Signature: 11/05/2018 4:35:36 PM Version By: Linton Ham MD Previous Signature: 10/29/2018 4:31:45 PM Version By: Linton Ham MD Entered By: Gretta Cool, BSN, RN, CWS, Kim on 11/14/2018 16:13:06 KANO, HECKMANN ([id number]) -------------------------------------------------------------------------------- ROS/PFSH Details Patient Name: [name] Date of Service: 10/29/2018 9:00 AM Medical Record Number: [id number] Patient Account Number: [id number] Date of Birth/Sex: 12-26-1932 (85 y.o. M) Treating RN: Harold Barban Primary Care Provider: Lew Dawes Other Clinician: Referring Provider: Lew Dawes Treating Provider/Extender: Tito Dine in Treatment: 0 Information Obtained From Patient Constitutional Symptoms (General Health) Complaints and Symptoms: Negative for: Fatigue; Fever; Chills;  Marked Weight Change Eyes Complaints and Symptoms: Positive for: Glasses / Contacts Negative for: Dry Eyes; Vision Changes Medical History: Positive for: Cataracts - removed; Glaucoma - pressure Negative for: Optic Neuritis Ear/Nose/Mouth/Throat Complaints and Symptoms: Negative for: Difficult clearing ears; Sinusitis Medical History: Negative for: Chronic sinus problems/congestion; Middle ear problems Hematologic/Lymphatic Complaints and Symptoms: Negative for: Bleeding / Clotting Disorders; Human Immunodeficiency Virus Medical History: Negative for: Anemia; Hemophilia; Human Immunodeficiency Virus; Lymphedema; Sickle Cell Disease Respiratory Complaints and Symptoms: Negative for: Chronic or frequent coughs; Shortness of Breath Medical History: Negative for: Aspiration; Asthma; Chronic Obstructive Pulmonary Disease (COPD); Pneumothorax; Sleep Apnea; Tuberculosis Cardiovascular Complaints and Symptoms: Negative for: Chest pain; LE edema Medical History: Positive for: Peripheral Arterial Disease TYHIR, SCHWAN ([id number]) Gastrointestinal Complaints and Symptoms: Negative for: Frequent diarrhea; Nausea; Vomiting Medical History: Negative for: Cirrhosis ; Colitis; Crohnos; Hepatitis A; Hepatitis B; Hepatitis C Endocrine Complaints and Symptoms: Negative for: Hepatitis; Thyroid disease; Polydypsia (Excessive Thirst) Medical History: Negative for: Type I Diabetes; Type II Diabetes Genitourinary Complaints and Symptoms: Negative for: Kidney failure/ Dialysis; Incontinence/dribbling Medical History: Negative for: End Stage Renal Disease Immunological Complaints and Symptoms: Negative for: Hives; Itching Medical History: Negative for: Lupus Erythematosus; Raynaudos; Scleroderma Integumentary (Skin) Complaints and Symptoms: Positive for: Wounds - 4 Negative for: Bleeding or bruising tendency; Breakdown; Swelling Medical History: Negative for: History of Burn; History  of pressure wounds Musculoskeletal Complaints and Symptoms: Negative for: Muscle Pain; Muscle Weakness Medical History: Positive for: Gout - toe great Neurologic Complaints and Symptoms: Negative for: Numbness/parasthesias; Focal/Weakness Medical History: Negative for: Dementia; Neuropathy; Quadriplegia; Seizure Disorder Psychiatric GERGORY, BIELLO ([id number]) Complaints and Symptoms: Negative for: Anxiety; Claustrophobia Medical History: Negative for: Anorexia/bulimia; Confinement Anxiety Oncologic Medical History: Negative for: Received Chemotherapy; Received Radiation HBO Extended History Items Eyes: Eyes: Cataracts Glaucoma Immunizations Pneumococcal Vaccine: Received Pneumococcal Vaccination: No Implantable Devices None Family and Social History Cancer: Yes - Mother; Diabetes: No; Heart Disease: No; Hereditary Spherocytosis: No; Hypertension: No; Kidney Disease: No; Lung Disease: No; Seizures: No; Stroke: No; Thyroid Problems: No; Tuberculosis: No; Former smoker - 40 years ago; Marital Status - Widowed; Alcohol Use: Never; Drug Use: No History; Caffeine Use: Moderate; Financial Concerns: No; Food, Clothing or Shelter Needs: No; Support System Lacking: No; Transportation Concerns: No Electronic Signature(s) Signed: 10/29/2018  4:43:15 PM By: Harold Barban Signed: 10/29/2018 4:31:45 PM By: Linton Ham MD Entered By: Harold Barban on 10/29/2018 09:03:07 [name] ([id number]) -------------------------------------------------------------------------------- [place] Details Patient Name: [name] Date of Service: 10/29/2018 Medical Record Number: [id number] Patient Account Number: [id number] Date of Birth/Sex: 06/27/1932 (85 y.o. 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, left leg Facility Procedures CPT4 Code: [id number] Description: [place] VISIT-LEV 3 EST PT Modifier: Quantity: 1 CPT4 Code: [id number] Description: 11042 - DEB SUBQ TISSUE 20 SQ CM/< ICD-10 Diagnosis Description L89.893 Pressure ulcer of other site, stage 3 Modifier: Quantity: 1 Physician Procedures CPT4 Code: [id number] Description: 09735 - WC PHYS LEVEL 4 - EST PT ICD-10 Diagnosis Description L89.893 Pressure ulcer of other site, stage 3 L89.890 Pressure ulcer of other site, unstageable I70.292 Other atherosclerosis of native arteries of extremities, left Modifier: leg Quantity: 1 CPT4 Code: [id number] Description: 11042 - WC PHYS SUBQ TISS 20 SQ CM ICD-10 Diagnosis Description L89.893 Pressure ulcer of other site, stage 3 Modifier: Quantity: 1 Electronic Signature(s) Signed: 10/29/2018 4:31:45 PM By: Linton Ham MD Entered By: Linton Ham on 10/29/2018 10:26:17

## 2018-10-30 NOTE — Progress Notes (Addendum)
SALLY, REIMERS ([id number]) Visit Report for 10/29/2018 Allergy List Details Patient Name: [name] [name], [name] [name] Date of Service: 10/29/2018 9:00 AM Medical Record Number: [id number] Patient Account Number: [id number] Date of Birth/Sex: July 25, 1932 (85 y.o. M) Treating RN: Harold Barban Primary Care Debi Cousin: Lew Dawes Other Clinician: Referring Amaria Mundorf: Lew Dawes Treating Vincentina Sollers/Extender: Ricard Dillon Weeks in Treatment: 0 Allergies Active Allergies No Known Drug Allergies Allergy Notes Electronic Signature(s) Signed: 10/29/2018 3:56:42 PM By: Harold Barban Entered By: Harold Barban on 10/29/2018 08:57:28 [name] [name] ([id number]) -------------------------------------------------------------------------------- [place] Information Details Patient Name: [name] [name] Date of Service: 10/29/2018 9:00 AM Medical Record Number: [id number] Patient Account Number: [id number] Date of Birth/Sex: 10-09-1932 (85 y.o. M) Treating RN: Harold Barban Primary Care Carlesha Seiple: Lew Dawes Other Clinician: Referring Abena Erdman: Lew Dawes Treating Montgomery Favor/Extender: Tito Dine in Treatment: 0 Visit Information Patient Arrived: Ambulatory Arrival Time: 08:41 Accompanied By: daughter Transfer Assistance: None Patient Identification Verified: Yes Secondary Verification Process Completed: Yes Electronic Signature(s) Signed: 10/29/2018 3:56:42 PM By: Harold Barban Entered By: Harold Barban on 10/29/2018 08:53:05 [name] [name] ([id number]) -------------------------------------------------------------------------------- Clinic Level of Care Assessment Details Patient Name: [name] [name] Date of Service: 10/29/2018 9:00 AM Medical Record Number: [id number] Patient Account Number: [id number] Date of Birth/Sex: 1932-06-27 (85 y.o. M) Treating RN: Cornell Barman Primary Care Barre Aydelott: Lew Dawes Other Clinician: Referring Azarria Balint:  Lew Dawes Treating Dionne Knoop/Extender: Tito Dine in Treatment: 0 Clinic Level of Care Assessment Items TOOL 1 Quantity Score []  - Use when EandM and Procedure is performed on INITIAL visit 0 ASSESSMENTS - Nursing Assessment / Reassessment X - General Physical Exam (combine w/ comprehensive assessment (listed just below) when 1 20 performed on new pt. evals) X- 1 25 Comprehensive Assessment (HX, ROS, Risk Assessments, Wounds Hx, etc.) ASSESSMENTS - Wound and Skin Assessment / Reassessment []  - Dermatologic / Skin Assessment (not related to wound area) 0 ASSESSMENTS - Ostomy and/or Continence Assessment and Care []  - Incontinence Assessment and Management 0 []  - 0 Ostomy Care Assessment and Management (repouching, etc.) PROCESS - Coordination of Care X - Simple Patient / Family Education for ongoing care 1 15 []  - 0 Complex (extensive) Patient / Family Education for ongoing care X- 1 10 Staff obtains Programmer, systems, Records, Test Results / Process Orders []  - 0 Staff telephones HHA, Nursing Homes / Clarify orders / etc []  - 0 Routine Transfer to another Facility (non-emergent condition) []  - 0 Routine Hospital Admission (non-emergent condition) X- 1 15 New Admissions / Biomedical engineer / Ordering NPWT, Apligraf, etc. []  - 0 Emergency Hospital Admission (emergent condition) PROCESS - Special Needs []  - Pediatric / Minor Patient Management 0 []  - 0 Isolation Patient Management []  - 0 Hearing / Language / Visual special needs []  - 0 Assessment of Community assistance (transportation, D/C planning, etc.) []  - 0 Additional assistance / Altered mentation []  - 0 Support Surface(s) Assessment (bed, cushion, seat, etc.) ELIGA, ARVIE ([id number]) INTERVENTIONS - Miscellaneous []  - External ear exam 0 []  - 0 Patient Transfer (multiple staff / Civil Service fast streamer / Similar devices) []  - 0 Simple Staple / Suture removal (25 or less) []  - 0 Complex Staple /  Suture removal (26 or more) []  - 0 Hypo/Hyperglycemic Management (do not check if billed separately) X- 1 15 Ankle / Brachial Index (ABI) - do not check if billed separately Has the patient been seen at the hospital within the last three years: Yes Total Score: 100 Level Of Care: New/Established - Level 3  Electronic Signature(s) Signed: 10/29/2018 4:20:22 PM By: Gretta Cool, BSN, RN, CWS, Kim RN, BSN Entered By: Gretta Cool, BSN, RN, CWS, Kim on 10/29/2018 09:40:20 HOLDYN, POYSER ([id number]) -------------------------------------------------------------------------------- Encounter Discharge Information Details Patient Name: [name] [name] Date of Service: 10/29/2018 9:00 AM Medical Record Number: [id number] Patient Account Number: [id number] Date of Birth/Sex: [birth year] (85 y.o. M) Treating RN: Cornell Barman Primary Care Keilyn Haggard: Lew Dawes Other Clinician: Referring Jasiel Belisle: Lew Dawes Treating Amiah Frohlich/Extender: Tito Dine in Treatment: 0 Encounter Discharge Information Items Post Procedure Vitals Discharge Condition: Stable Temperature (F): 98.6 Ambulatory Status: Ambulatory Pulse (bpm): 65 Discharge Destination: Home Respiratory Rate (breaths/min): 18 Transportation: Private Auto Blood Pressure (mmHg): 167/57 Accompanied By: daughter Schedule Follow-up Appointment: Yes Clinical Summary of Care: Electronic Signature(s) Signed: 10/29/2018 4:20:22 PM By: Gretta Cool, BSN, RN, CWS, Kim RN, BSN Entered By: Gretta Cool, BSN, RN, CWS, Kim on 10/29/2018 09:45:09 [name] [name] ([id number]) -------------------------------------------------------------------------------- Lower Extremity Assessment Details Patient Name: [name] [name] Date of Service: 10/29/2018 9:00 AM Medical Record Number: [id number] Patient Account Number: [id number] Date of Birth/Sex: 06-06-1932 (85 y.o. M) Treating RN: Harold Barban Primary Care Airon Sahni: Lew Dawes Other Clinician: Referring  Caidance Sybert: Lew Dawes Treating Latreece Mochizuki/Extender: Tito Dine in Treatment: 0 Edema Assessment Assessed: [Left: No] [Right: No] [Left: Edema] [Right: :] Calf Left: Right: Point of Measurement: 32 cm From Medial Instep 32.5 cm 31.6 cm Ankle Left: Right: Point of Measurement: 12 cm From Medial Instep 23 cm 22.2 cm Vascular Assessment Pulses: Dorsalis Pedis Palpable: [Left:Yes] [Right:Yes] Doppler Audible: [Left:Yes] [Right:Yes] Posterior Tibial Palpable: [Left:Yes] [Right:Yes] Doppler Audible: [Left:Yes] [Right:Yes] Blood Pressure: Brachial: [Left:150] [Right:152] Dorsalis Pedis: 130 [Left:Dorsalis Pedis: 110] Ankle: Posterior Tibial: 128 [Left:Posterior Tibial: 118 0.86] [Right:0.78] Electronic Signature(s) Signed: 10/29/2018 3:56:42 PM By: Harold Barban Entered By: Harold Barban on 10/29/2018 09:18:47 [name] [name] ([id number]) -------------------------------------------------------------------------------- Multi Wound Chart Details Patient Name: [name] [name] Date of Service: 10/29/2018 9:00 AM Medical Record Number: [id number] Patient Account Number: [id number] Date of Birth/Sex: [birth year] (85 y.o. M) Treating RN: Cornell Barman Primary Care Mayanna Garlitz: Lew Dawes Other Clinician: Referring Henley Boettner: Lew Dawes Treating Valinda Fedie/Extender: Tito Dine in Treatment: 0 Vital Signs Height(in): 69 Pulse(bpm): 72 Weight(lbs): 150 Blood Pressure(mmHg): 167/57 Body Mass Index(BMI): 22 Temperature(F): 98.6 Respiratory Rate 18 (breaths/min): Photos: Wound Location: Left Toe Great - Medial Left Toe Second - Medial Right Toe Great Wounding Event: Gradually Appeared Gradually Appeared Gradually Appeared Primary Etiology: Pressure Ulcer Pressure Ulcer Fungal Secondary Etiology: MASD o Moisture Associated MASD o Moisture Associated Pressure Ulcer Skin Damage Skin Damage Comorbid History: Cataracts, Glaucoma, Cataracts,  Glaucoma, Cataracts, Glaucoma, Peripheral Arterial Disease, Peripheral Arterial Disease, Peripheral Arterial Disease, Gout Gout Gout Date Acquired: 06/29/2018 06/29/2018 06/29/2018 Weeks of Treatment: 0 0 0 Wound Status: Open Open Open Measurements L x W x D 1x1.5x0.1 1x1.5x0.4 0.7x0.4x0.1 (cm) Area (cm) : 1.178 1.178 0.22 Volume (cm) : 0.118 0.471 0.022 % Reduction in Area: 0.00% 0.00% N/A % Reduction in Volume: 0.00% 0.00% N/A Classification: Unstageable/Unclassified Category/Stage III Full Thickness Without Exposed Support Structures Exudate Amount: Medium Medium Medium Exudate Type: Serous Serous Serous Exudate Color: amber amber amber Wound Margin: Flat and Intact Flat and Intact Flat and Intact Granulation Amount: Medium (34-66%) Medium (34-66%) Small (1-33%) Granulation Quality: Pink Pink Pink Necrotic Amount: Small (1-33%) Small (1-33%) Medium (34-66%) Exposed Structures: Fat Layer (Subcutaneous Fat Layer (Subcutaneous Fat Layer (Subcutaneous Tissue) Exposed: Yes Tissue) Exposed: Yes Tissue) Exposed: Yes Fascia: No Fascia: No Fascia: No ANCIL, DEWAN. ([id number]) Tendon: No Tendon: No  Tendon: No Muscle: No Muscle: No Muscle: No Joint: No Joint: No Joint: No Bone: No Bone: No Bone: No Epithelialization: None None Small (1-33%) Debridement: N/A Debridement - Excisional N/A Pre-procedure N/A 09:35 N/A Verification/Time Out Taken: Pain Control: N/A Lidocaine N/A Tissue Debrided: N/A Subcutaneous, Slough N/A Level: N/A Skin/Subcutaneous Tissue N/A Debridement Area (sq cm): N/A 1.5 N/A Instrument: N/A Curette N/A Bleeding: N/A Minimum N/A Hemostasis Achieved: N/A Pressure N/A Debridement Treatment N/A Procedure was tolerated well N/A Response: Post Debridement N/A 1x1.5x0.4 N/A Measurements L x W x D (cm) Post Debridement Volume: N/A 0.471 N/A (cm) Procedures Performed: N/A Debridement N/A Wound Number: 4 N/A N/A Photos: N/A N/A Wound Location: Right  Toe Second N/A N/A Wounding Event: Gradually Appeared N/A N/A Primary Etiology: Fungal N/A N/A Secondary Etiology: Pressure Ulcer N/A N/A Comorbid History: Cataracts, Glaucoma, N/A N/A Peripheral Arterial Disease, Gout Date Acquired: 06/29/2018 N/A N/A Weeks of Treatment: 0 N/A N/A Wound Status: Open N/A N/A Measurements L x W x D 0.2x0.2x0.1 N/A N/A (cm) Area (cm) : 0.031 N/A N/A Volume (cm) : 0.003 N/A N/A % Reduction in Area: N/A N/A N/A % Reduction in Volume: N/A N/A N/A Classification: Full Thickness Without N/A N/A Exposed Support Structures Exudate Amount: Medium N/A N/A Exudate Type: Serous N/A N/A Exudate Color: amber N/A N/A Wound Margin: Flat and Intact N/A N/A Granulation Amount: Small (1-33%) N/A N/A Granulation Quality: Pink N/A N/A DEION, SWIFT ([id number]) Necrotic Amount: Medium (34-66%) N/A N/A Exposed Structures: Fat Layer (Subcutaneous N/A N/A Tissue) Exposed: Yes Fascia: No Tendon: No Muscle: No Joint: No Bone: No Epithelialization: None N/A N/A Debridement: N/A N/A N/A Pain Control: N/A N/A N/A Tissue Debrided: N/A N/A N/A Level: N/A N/A N/A Debridement Area (sq cm): N/A N/A N/A Instrument: N/A N/A N/A Bleeding: N/A N/A N/A Hemostasis Achieved: N/A N/A N/A Debridement Treatment N/A N/A N/A Response: Post Debridement N/A N/A N/A Measurements L x W x D (cm) Post Debridement Volume: N/A N/A N/A (cm) Procedures Performed: N/A N/A N/A Treatment Notes Wound #1 (Left, Medial Toe Great) 1. Cleansed with: Cleanse wound with antibacterial soap and water 2. Anesthetic Topical Lidocaine 4% cream to wound bed prior to debridement 4. Dressing Applied: Other dressing (specify in notes) Notes Silvercell, lambs wool between toes, conform to secure, wider shoes needed Wound #2 (Left, Medial Toe Second) 1. Cleansed with: Cleanse wound with antibacterial soap and water 2. Anesthetic Topical Lidocaine 4% cream to wound bed prior to debridement 4.  Dressing Applied: Other dressing (specify in notes) Notes Silvercell, lambs wool between toes, conform to secure, wider shoes needed Wound #3 (Right Toe Great) 1. Cleansed with: Cleanse wound with antibacterial soap and water 2. Anesthetic Topical Lidocaine 4% cream to wound bed prior to debridement 4. Dressing Applied: JOSIMAR, CORNING ([id number]) Other dressing (specify in notes) Notes Silvercell, lambs wool between toes, conform to secure, wider shoes needed Wound #4 (Right Toe Second) 1. Cleansed with: Cleanse wound with antibacterial soap and water 2. Anesthetic Topical Lidocaine 4% cream to wound bed prior to debridement 4. Dressing Applied: Other dressing (specify in notes) Notes Silvercell, lambs wool between toes, conform to secure, wider shoes needed Electronic Signature(s) Signed: 10/29/2018 4:31:45 PM By: Linton Ham MD Entered By: Linton Ham on 10/29/2018 10:07:37 [name] [name] ([id number]) -------------------------------------------------------------------------------- [place] Details Patient Name: [name] [name] Date of Service: 10/29/2018 9:00 AM Medical Record Number: [id number] Patient Account Number: [id number] Date of Birth/Sex: January 26, 1933 (85 y.o. M) Treating RN: Cornell Barman Primary  Care Jini Horiuchi: Lew Dawes Other Clinician: Referring Jonna Dittrich: Lew Dawes Treating Markeesha Char/Extender: Tito Dine in Treatment: 0 Active Inactive Abuse / Safety / Falls / Self Care Management Nursing Diagnoses: Potential for falls Goals: Patient will not experience any injury related to falls Date Initiated: 10/29/2018 Target Resolution Date: 11/12/2018 Goal Status: Active Interventions: Provide education on basic hygiene Notes: Orientation to the Wound Care Program Nursing Diagnoses: Knowledge deficit related to the wound healing center program Goals: Patient/caregiver will verbalize understanding of the [place] Program Date Initiated: 10/29/2018 Target Resolution Date: 10/29/2018 Goal Status: Active Interventions: Provide education on orientation to the wound center Notes: Pressure Nursing Diagnoses: Potential for impaired tissue integrity related to pressure, friction, moisture, and shear Goals: Patient will remain free of pressure ulcers Date Initiated: 10/29/2018 Target Resolution Date: 11/12/2018 Goal Status: Active Interventions: Assess potential for pressure ulcer upon admission and as needed KHYRON, GARNO ([id number]) Notes: Soft Tissue Infection Nursing Diagnoses: Potential for infection: soft tissue Goals: Patient will remain free of wound infection Date Initiated: 10/29/2018 Target Resolution Date: 11/12/2018 Goal Status: Active Interventions: Assess signs and symptoms of infection every visit Notes: Wound/Skin Impairment Nursing Diagnoses: Impaired tissue integrity Goals: Ulcer/skin breakdown will have a volume reduction of 30% by week 4 Date Initiated: 10/29/2018 Target Resolution Date: 11/29/2018 Goal Status: Active Interventions: Assess ulceration(s) every visit Treatment Activities: Skin care regimen initiated : 10/29/2018 Topical wound management initiated : 10/29/2018 Notes: Electronic Signature(s) Signed: 10/29/2018 4:20:22 PM By: Gretta Cool, BSN, RN, CWS, Kim RN, BSN Entered By: Gretta Cool, BSN, RN, CWS, Kim on 10/29/2018 09:34:26 [name] [name] ([id number]) -------------------------------------------------------------------------------- Pain Assessment Details Patient Name: [name] [name] Date of Service: 10/29/2018 9:00 AM Medical Record Number: [id number] Patient Account Number: [id number] Date of Birth/Sex: 12/18/1932 (85 y.o. M) Treating RN: Harold Barban Primary Care Gemma Ruan: Lew Dawes Other Clinician: Referring Ellias Mcelreath: Lew Dawes Treating Bernell Sigal/Extender: Tito Dine in Treatment: 0 Active Problems Location of  Pain Severity and Description of Pain Patient Has Paino Yes Site Locations Rate the pain. Current Pain Level: 4 Pain Management and Medication Current Pain Management: Electronic Signature(s) Signed: 10/29/2018 3:56:42 PM By: Harold Barban Entered By: Harold Barban on 10/29/2018 08:56:23 [name] [name] ([id number]) -------------------------------------------------------------------------------- Patient/Caregiver Education Details Patient Name: [name] [name] Date of Service: 10/29/2018 9:00 AM Medical Record Number: [id number] Patient Account Number: [id number] Date of Birth/Gender: 1933-01-18 (85 y.o. M) Treating RN: Cornell Barman Primary Care Physician: Lew Dawes Other Clinician: Referring Physician: Lew Dawes Treating Physician/Extender: Tito Dine in Treatment: 0 Education Assessment Education Provided To: Patient Education Topics Provided Basic Hygiene: Handouts: Other: Wash between toes with soap and water Methods: Demonstration, Explain/Verbal Responses: State content correctly Pressure: Handouts: Pressure Ulcers: Care and Offloading Methods: Demonstration, Explain/Verbal Responses: State content correctly Welcome To [facility] [place]: Handouts: Welcome To [facility] [place] Methods: Demonstration, Explain/Verbal Responses: State content correctly Wound/Skin Impairment: Handouts: Caring for Your Ulcer Methods: Demonstration, Explain/Verbal Responses: State content correctly Electronic Signature(s) Signed: 10/29/2018 4:20:22 PM By: Gretta Cool, BSN, RN, CWS, Kim RN, BSN Entered By: Gretta Cool, BSN, RN, CWS, Kim on 10/29/2018 09:41:11 [name] [name] ([id number]) -------------------------------------------------------------------------------- Wound Assessment Details Patient Name: [name] [name] Date of Service: 10/29/2018 9:00 AM Medical Record Number: [id number] Patient Account Number: [id number] Date of Birth/Sex: 27-Apr-1932 (85 y.o.  M) Treating RN: Harold Barban Primary Care Seylah Wernert: Lew Dawes Other Clinician: Referring Arminta Gamm: Lew Dawes Treating Lopez Dentinger/Extender: Tito Dine in Treatment: 0 Wound Status Wound Number: 1 Primary Etiology:  Pressure Ulcer Wound Location: Left Toe Great - Medial Secondary MASD o Moisture Associated Skin Damage Etiology: Wounding Event: Gradually Appeared Wound Status: Open Date Acquired: 06/29/2018 Comorbid Cataracts, Glaucoma, Peripheral Arterial Weeks Of Treatment: 0 History: Disease, Gout Clustered Wound: No Photos Wound Measurements Length: (cm) 1 % Reduction Width: (cm) 1.5 % Reduction Depth: (cm) 0.1 Epithelializ Area: (cm) 1.178 Tunneling: Volume: (cm) 0.118 Undermining in Area: 0% in Volume: 0% ation: None No : No Wound Description Classification: Unstageable/Unclassified Foul Odor A Wound Margin: Flat and Intact Exudate Amount: Medium Exudate Type: Serous Exudate Color: amber fter Cleansing: No Wound Bed Granulation Amount: Medium (34-66%) Exposed Structure Granulation Quality: Pink Fascia Exposed: No Necrotic Amount: Small (1-33%) Fat Layer (Subcutaneous Tissue) Exposed: Yes Necrotic Quality: Adherent Slough Tendon Exposed: No Muscle Exposed: No Joint Exposed: No Bone Exposed: No Electronic Signature(s) ROBEY, MASSMANN ([id number]) Signed: 11/14/2018 4:03:53 PM By: Gretta Cool, BSN, RN, CWS, Kim RN, BSN Signed: 12/12/2018 1:06:31 PM By: Harold Barban Previous Signature: 10/29/2018 10:06:51 AM Version By: Gretta Cool, BSN, RN, CWS, Kim RN, BSN Previous Signature: 10/29/2018 3:56:42 PM Version By: Harold Barban Entered By: Gretta Cool BSN, RN, CWS, Kim on 11/14/2018 16:03:52 XAVIER, MUNGER ([id number]) -------------------------------------------------------------------------------- Wound Assessment Details Patient Name: [name] [name] Date of Service: 10/29/2018 9:00 AM Medical Record Number: [id number] Patient Account Number:  [id number] Date of Birth/Sex: [birth year] (85 y.o. M) Treating RN: Harold Barban Primary Care Denzil Mceachron: Lew Dawes Other Clinician: Referring Tatyanna Cronk: Lew Dawes Treating Trevious Rampey/Extender: Tito Dine in Treatment: 0 Wound Status Wound Number: 2 Primary Etiology: Pressure Ulcer Wound Location: Left Toe Second - Medial Secondary MASD o Moisture Associated Skin Damage Etiology: Wounding Event: Gradually Appeared Wound Status: Open Date Acquired: 06/29/2018 Comorbid Cataracts, Glaucoma, Peripheral Arterial Weeks Of Treatment: 0 History: Disease, Gout Clustered Wound: No Photos Wound Measurements Length: (cm) 1 % Reduction in Width: (cm) 1.5 % Reduction in Depth: (cm) 0.4 Epithelializat Area: (cm) 1.178 Tunneling: Volume: (cm) 0.471 Undermining: Area: 0% Volume: 0% ion: None No No Wound Description Classification: Category/Stage III Foul Odor Aft Wound Margin: Flat and Intact Slough/Fibrin Exudate Amount: Medium Exudate Type: Serous Exudate Color: amber er Cleansing: No o Yes Wound Bed Granulation Amount: Medium (34-66%) Exposed Structure Granulation Quality: Pink Fascia Exposed: No Necrotic Amount: Small (1-33%) Fat Layer (Subcutaneous Tissue) Exposed: Yes Necrotic Quality: Adherent Slough Tendon Exposed: No Muscle Exposed: No Joint Exposed: No Bone Exposed: No Electronic Signature(s) GOTTLIEB, ZUERCHER ([id number]) Signed: 11/14/2018 4:04:03 PM By: Gretta Cool, BSN, RN, CWS, Kim RN, BSN Signed: 12/12/2018 1:06:31 PM By: Harold Barban Previous Signature: 10/29/2018 10:07:07 AM Version By: Gretta Cool, BSN, RN, CWS, Kim RN, BSN Previous Signature: 10/29/2018 3:56:42 PM Version By: Harold Barban Previous Signature: 10/29/2018 9:56:55 AM Version By: Gretta Cool, BSN, RN, CWS, Kim RN, BSN Entered By: Gretta Cool, BSN, RN, CWS, Kim on 11/14/2018 16:04:03 CHI, GARLOW  ([id number]) -------------------------------------------------------------------------------- Wound Assessment Details Patient Name: [name] [name] Date of Service: 10/29/2018 9:00 AM Medical Record Number: [id number] Patient Account Number: [id number] Date of Birth/Sex: 1932/05/26 (85 y.o. M) Treating RN: Harold Barban Primary Care Mareli Antunes: Lew Dawes Other Clinician: Referring Elena Davia: Lew Dawes Treating Meryn Sarracino/Extender: Tito Dine in Treatment: 0 Wound Status Wound Number: 3 Primary Pressure Ulcer Etiology: Wound Location: Right Toe Great Wound Status: Open Wounding Event: Gradually Appeared Comorbid Cataracts, Glaucoma, Peripheral Arterial Date Acquired: 06/29/2018 History: Disease, Gout Weeks Of Treatment: 0 Clustered Wound: No Photos Wound Measurements Length: (cm) 0.7 % Reduction in Width: (cm) 0.4 % Reduction in Depth: (cm) 0.1 Epithelializat  Area: (cm) 0.22 Tunneling: Volume: (cm) 0.022 Undermining: Area: 0% Volume: 0% ion: Small (1-33%) No No Wound Description Classification: Category/Stage II Foul Odor Aft Wound Margin: Flat and Intact Slough/Fibrin Exudate Amount: Medium Exudate Type: Serous Exudate Color: amber er Cleansing: No o Yes Wound Bed Granulation Amount: Medium (34-66%) Exposed Structure Granulation Quality: Pink Fascia Exposed: No Necrotic Amount: Small (1-33%) Fat Layer (Subcutaneous Tissue) Exposed: No Tendon Exposed: No Muscle Exposed: No Joint Exposed: No Bone Exposed: No Limited to Skin Breakdown WESLY, WHISENANT ([id number]) Electronic Signature(s) Signed: 11/14/2018 4:09:07 PM By: Gretta Cool, BSN, RN, CWS, Kim RN, BSN Signed: 12/12/2018 1:06:31 PM By: Harold Barban Previous Signature: 10/29/2018 10:08:36 AM Version By: Gretta Cool, BSN, RN, CWS, Kim RN, BSN Previous Signature: 10/29/2018 3:56:42 PM Version By: Harold Barban Entered By: Gretta Cool BSN, RN, CWS, Kim on 11/14/2018 16:09:07 YALE, GOLLA  ([id number]) -------------------------------------------------------------------------------- Wound Assessment Details Patient Name: [name] [name] Date of Service: 10/29/2018 9:00 AM Medical Record Number: [id number] Patient Account Number: [id number] Date of Birth/Sex: 07/31/1932 (85 y.o. M) Treating RN: Harold Barban Primary Care Carolan Avedisian: Lew Dawes Other Clinician: Referring Zaniya Mcaulay: Lew Dawes Treating Maddyson Keil/Extender: Tito Dine in Treatment: 0 Wound Status Wound Number: 4 Primary Pressure Ulcer Etiology: Wound Location: Right Toe Second Wound Status: Open Wounding Event: Gradually Appeared Comorbid Cataracts, Glaucoma, Peripheral Arterial Date Acquired: 06/29/2018 History: Disease, Gout Weeks Of Treatment: 0 Clustered Wound: No Photos Wound Measurements Length: (cm) 0.2 % Reducti Width: (cm) 0.2 % Reducti Depth: (cm) 0.1 Epithelia Area: (cm) 0.031 Tunnelin Volume: (cm) 0.003 Undermin on in Area: 0% on in Volume: 0% lization: None g: No ing: No Wound Description Classification: Category/Stage I Foul Odo Wound Margin: Flat and Intact Slough/F Exudate Amount: None Present r After Cleansing: No ibrino No Wound Bed Granulation Amount: None Present (0%) Exposed Structure Necrotic Amount: None Present (0%) Fascia Exposed: No Fat Layer (Subcutaneous Tissue) Exposed: No Tendon Exposed: No Muscle Exposed: No Joint Exposed: No Bone Exposed: No Limited to Skin Breakdown Electronic Signature(s) Signed: 11/14/2018 4:08:51 PM By: Gretta Cool, BSN, RN, CWS, Kim RN, BSN Alice, Porter [place] ([id number]) Signed: 12/12/2018 1:06:31 PM By: Harold Barban Previous Signature: 10/29/2018 10:09:41 AM Version By: Gretta Cool, BSN, RN, CWS, Kim RN, BSN Previous Signature: 10/29/2018 3:56:42 PM Version By: Harold Barban Entered By: Gretta Cool BSN, RN, CWS, Kim on 11/14/2018 16:08:50 HAEDYN, ANCRUM  ([id number]) -------------------------------------------------------------------------------- [place] Details Patient Name: [name] [name] Date of Service: 10/29/2018 9:00 AM Medical Record Number: [id number] Patient Account Number: [id number] Date of Birth/Sex: [birth year] (85 y.o. M) Treating RN: Harold Barban Primary Care Dakari Stabler: Lew Dawes Other Clinician: Referring Breana Litts: Lew Dawes Treating Kendre Sires/Extender: Tito Dine in Treatment: 0 Vital Signs Time Taken: 08:50 Temperature (F): 98.6 Height (in): 69 Pulse (bpm): 72 Source: Stated Respiratory Rate (breaths/min): 18 Weight (lbs): 150 Blood Pressure (mmHg): 167/57 Source: Stated Reference Range: 80 - 120 mg / dl Body Mass Index (BMI): 22.1 Electronic Signature(s) Signed: 10/29/2018 3:56:42 PM By: Harold Barban Entered By: Harold Barban on 10/29/2018 08:57:11

## 2018-10-31 ENCOUNTER — Other Ambulatory Visit: Payer: Self-pay

## 2018-11-03 ENCOUNTER — Encounter (HOSPITAL_BASED_OUTPATIENT_CLINIC_OR_DEPARTMENT_OTHER): Payer: Medicare Other | Attending: Internal Medicine

## 2018-11-05 ENCOUNTER — Other Ambulatory Visit: Payer: Self-pay

## 2018-11-05 ENCOUNTER — Encounter: Payer: Medicare Other | Admitting: Internal Medicine

## 2018-11-05 DIAGNOSIS — F0390 Unspecified dementia without behavioral disturbance: Secondary | ICD-10-CM | POA: Diagnosis not present

## 2018-11-05 DIAGNOSIS — S91102A Unspecified open wound of left great toe without damage to nail, initial encounter: Secondary | ICD-10-CM | POA: Diagnosis not present

## 2018-11-05 DIAGNOSIS — S91101A Unspecified open wound of right great toe without damage to nail, initial encounter: Secondary | ICD-10-CM | POA: Diagnosis not present

## 2018-11-05 DIAGNOSIS — L89890 Pressure ulcer of other site, unstageable: Secondary | ICD-10-CM | POA: Diagnosis not present

## 2018-11-05 DIAGNOSIS — I70202 Unspecified atherosclerosis of native arteries of extremities, left leg: Secondary | ICD-10-CM | POA: Diagnosis not present

## 2018-11-05 DIAGNOSIS — L89893 Pressure ulcer of other site, stage 3: Secondary | ICD-10-CM | POA: Diagnosis not present

## 2018-11-05 DIAGNOSIS — I2510 Atherosclerotic heart disease of native coronary artery without angina pectoris: Secondary | ICD-10-CM | POA: Diagnosis not present

## 2018-11-05 DIAGNOSIS — S91104A Unspecified open wound of right lesser toe(s) without damage to nail, initial encounter: Secondary | ICD-10-CM | POA: Diagnosis not present

## 2018-11-05 DIAGNOSIS — I872 Venous insufficiency (chronic) (peripheral): Secondary | ICD-10-CM | POA: Diagnosis not present

## 2018-11-05 DIAGNOSIS — S91105A Unspecified open wound of left lesser toe(s) without damage to nail, initial encounter: Secondary | ICD-10-CM | POA: Diagnosis not present

## 2018-11-05 NOTE — Progress Notes (Signed)
SEANMICHAEL, SALMONS ([id number]) Visit Report for 11/05/2018 HPI Details Patient Name: [name] [name], [name] [name] Date of Service: 11/05/2018 9:00 AM Medical Record Number: [id number] Patient Account Number: [id number] Date of Birth/Sex: 1932-12-20 (85 y.o. M) Treating RN: Cornell Barman Primary Care Provider: Lew Dawes Other Clinician: Referring Provider: Lew Dawes Treating Provider/Extender: Tito Dine in Treatment: 1 History of Present Illness HPI Description: [place] 12/2017 This is an 85 year old man who is accompanied by his daughter today. They're largely here for concerns for discoloration in his bilateral lower legs. This is apparently been getting worse over the last year and is more extensive on the right lower leg. He also developed swelling of his legs but no real pain. He had DVT rule out studies in May of this year that were negative bilaterally. The patient has not had reflux studies. He does wear compression stockings although we are not exactly sure what strength compression. Recently his primary care doctor told the patient not to wear these for bit of time and so they are wearing them currently. Looking through [facility] link the patient had arterial studies done in 2015. This showed aortoiliac disease and a greater than 650% right SFA stenosis. He had 3 vessel runoff. He was reviewed by Dr. Fletcher Anon in March 2016 at that point he quoted a right ABI of 0.77 and 0.86 on the left. There was evidence of inflow disease based on waveforms. Duplex showed significant bilateral SFA disease. As he was reasonably asymptomatic it was recommended he have medical therapy. That was based on the absence of claudication. Currently the patient states he can walk around the block and walks around with his daughter in department stores etc. He does not describe claudication does not seem to be limited by any way I can determine. The patient is not a diabetic. He has a remote  coronary artery bypass that used to vein out of the right leg. He is on Lasix. ABIs in our clinic were 0.51 on the right and 0.71 on the left ADMISSION 10/29/2018 This is a patient I saw 1 time at [facility] health wound care center in August 2019. At that point he did not have any open wounds. He does have known PAD and chronic venous insufficiency. He is not a diabetic and currently does not describe claudication-like symptoms. He has not followed up with his arterial disease as far as I can tell. Over the last several months they have noticed wound on the medial aspect of the left second toe and more recently on the lateral aspect of the first toe in the webspace. They had a compound medication that included lidocaine, diclofenac cream and another part of this prescribed although they think that this made this worse. This was apparently for this prescribed by podiatry in [place] although I have no information on this. Importantly the patient does not describe pain or pruritus in his feet. Before the wounds were noticed he was walking 2 blocks around his home with his family without any claudication limiting symptoms. Past medical history includes moderate dementia, sciatica, onychomycosis, chronic venous insufficiency, glaucoma, coronary artery disease, hypertension, hyperlipidemia. Remote CABG. He also has PAD [please see my notes from [facility] 8/19 included above] ABIs in our clinic were 0.72 on the right 0.86 on the left. These are actually stable to improved to what we recorded last year 7/22; the patient's wound on the lateral part of the left first toe has opened [was blistered last week]. In addition he  has the punched-out area in the first second toe webspace. There was apparently concern on the right first toe last week but this is not open this week. There is no concerning area in this location. We use silver alginate with gauze spreading the toes. DEE, MADAY  ([id number]) X-ray was negative Electronic Signature(s) Signed: 11/05/2018 4:35:36 PM By: Linton Ham MD Entered By: Linton Ham on 11/05/2018 10:07:39 [name] ([id number]) -------------------------------------------------------------------------------- Physical Exam Details Patient Name: [name] Date of Service: 11/05/2018 9:00 AM Medical Record Number: [id number] Patient Account Number: [id number] Date of Birth/Sex: 10-23-1932 (85 y.o. M) Treating RN: Cornell Barman Primary Care Provider: Lew Dawes Other Clinician: Referring Provider: Lew Dawes Treating Provider/Extender: Tito Dine in Treatment: 1 Constitutional Patient is hypertensive.. Pulse regular and within target range for patient.Marland Kitchen Respirations regular, non-labored and within target range.. Temperature is normal and within the target range for the patient.Marland Kitchen appears in no distress. Respiratory Respiratory effort is easy and symmetric bilaterally. Rate is normal at rest and on room air.. Cardiovascular Pedal pulses absent bilaterally. However his feet are warm. Musculoskeletal No abnormalities in the small joints of either the left first or second toe. Integumentary (Hair, Skin) There are no cutaneous issues. I did not feel there was any evidence of tinea pedis. Psychiatric No evidence of depression, anxiety, or agitation. Calm, cooperative, and communicative. Appropriate interactions and affect.. Notes Wound exam; the major problem is the left first and second toes. Lateral aspect of the first toe the blister here has opened but fortunately the tissue underneath does not look as bad as I feared. Reasonably healthy looking. The area on the medial part of the second toe has a reasonably clean base. As opposed to last week did not require any debridement. It has however punched out. Undermining in this area from roughly 6-9 o'clock Electronic Signature(s) Signed: 11/05/2018 4:35:36  PM By: Linton Ham MD Entered By: Linton Ham on 11/05/2018 09:48:13 [name] ([id number]) -------------------------------------------------------------------------------- Physician Orders Details Patient Name: [name] Date of Service: 11/05/2018 9:00 AM Medical Record Number: [id number] Patient Account Number: [id number] Date of Birth/Sex: 31-Jul-1932 (85 y.o. M) Treating RN: Cornell Barman Primary Care Provider: Lew Dawes Other Clinician: Referring Provider: Lew Dawes Treating Provider/Extender: Tito Dine in Treatment: 1 Verbal / Phone Orders: No Diagnosis Coding Wound Cleansing Wound #1 Left,Medial Toe Great o Dial antibacterial soap, wash wounds, rinse and pat dry prior to dressing wounds - clean between toes daily Wound #2 Left,Medial Toe Second o Dial antibacterial soap, wash wounds, rinse and pat dry prior to dressing wounds - clean between toes daily Wound #4 Right Toe Second o Dial antibacterial soap, wash wounds, rinse and pat dry prior to dressing wounds - clean between toes daily Anesthetic (add to Medication List) Wound #1 Left,Medial Toe Great o Topical Lidocaine 4% cream applied to wound bed prior to debridement (In Clinic Only). Wound #2 Left,Medial Toe Second o Topical Lidocaine 4% cream applied to wound bed prior to debridement (In Clinic Only). Wound #4 Right Toe Second o Topical Lidocaine 4% cream applied to wound bed prior to debridement (In Clinic Only). Primary Wound Dressing Wound #1 Left,Medial Toe Great o Silver Alginate Wound #2 Left,Medial Toe Second o Silver Alginate Wound #4 Right Toe Second o Silver Alginate Secondary Dressing Wound #1 Left,Medial Toe Great o Gauze and Kerlix/Conform - Lambs wool between toes Wound #2 Left,Medial Toe Second o Gauze and Kerlix/Conform - Lambs wool between toes Wound #4 Right Toe  Second o Gauze and Kerlix/Conform - Lambs wool between  toes Dressing Change Frequency Wound #1 Left,Medial Toe Great o Change dressing every day. SHAWNE, ESKELSON ([id number]) Wound #2 Left,Medial Toe Second o Change dressing every day. Wound #4 Right Toe Second o Change dressing every day. Follow-up Appointments Wound #1 Left,Medial Toe Great o Return Appointment in 1 week. Wound #2 Left,Medial Toe Second o Return Appointment in 1 week. Wound #4 Right Toe Second o Return Appointment in 1 week. Additional Orders / Instructions Wound #1 Left,Medial Toe Great o Activity as tolerated Wound #2 Left,Medial Toe Second o Activity as tolerated Wound #4 Right Toe Second o Activity as tolerated Electronic Signature(s) Signed: 11/05/2018 4:35:36 PM By: Linton Ham MD Signed: 11/05/2018 4:45:53 PM By: Gretta Cool, BSN, RN, CWS, Kim RN, BSN Entered By: Gretta Cool, BSN, RN, CWS, Kim on 11/05/2018 09:34:28 [name] ([id number]) -------------------------------------------------------------------------------- Problem List Details Patient Name: [name] Date of Service: 11/05/2018 9:00 AM Medical Record Number: [id number] Patient Account Number: [id number] Date of Birth/Sex: [birth year] (85 y.o. M) Treating RN: Cornell Barman Primary Care Provider: Lew Dawes Other Clinician: Referring Provider: Lew Dawes Treating Provider/Extender: Tito Dine in Treatment: 1 Active Problems ICD-10 Evaluated Encounter Code Description Active Date Today Diagnosis L89.893 Pressure ulcer of other site, stage 3 10/29/2018 No Yes L89.890 Pressure ulcer of other site, unstageable 10/29/2018 No Yes I70.292 Other atherosclerosis of native arteries of extremities, left leg 10/29/2018 No Yes Inactive Problems Resolved Problems Electronic Signature(s) Signed: 11/05/2018 4:35:36 PM By: Linton Ham MD Entered By: Linton Ham on 11/05/2018 09:42:31 [name]  ([id number]) -------------------------------------------------------------------------------- Progress Note Details Patient Name: [name] Date of Service: 11/05/2018 9:00 AM Medical Record Number: [id number] Patient Account Number: [id number] Date of Birth/Sex: 07-17-1932 (85 y.o. M) Treating RN: Cornell Barman Primary Care Provider: Lew Dawes Other Clinician: Referring Provider: Lew Dawes Treating Provider/Extender: Tito Dine in Treatment: 1 Subjective History of Present Illness (HPI) [place] 12/2017 This is an 85 year old man who is accompanied by his daughter today. They're largely here for concerns for discoloration in his bilateral lower legs. This is apparently been getting worse over the last year and is more extensive on the right lower leg. He also developed swelling of his legs but no real pain. He had DVT rule out studies in May of this year that were negative bilaterally. The patient has not had reflux studies. He does wear compression stockings although we are not exactly sure what strength compression. Recently his primary care doctor told the patient not to wear these for bit of time and so they are wearing them currently. Looking through [facility] link the patient had arterial studies done in 2015. This showed aortoiliac disease and a greater than 650% right SFA stenosis. He had 3 vessel runoff. He was reviewed by Dr. Fletcher Anon in March 2016 at that point he quoted a right ABI of 0.77 and 0.86 on the left. There was evidence of inflow disease based on waveforms. Duplex showed significant bilateral SFA disease. As he was reasonably asymptomatic it was recommended he have medical therapy. That was based on the absence of claudication. Currently the patient states he can walk around the block and walks around with his daughter in department stores etc. He does not describe claudication does not seem to be limited by any way I can determine. The  patient is not a diabetic. He has a remote coronary artery bypass that used to vein out of the right leg. He  is on Lasix. ABIs in our clinic were 0.51 on the right and 0.71 on the left ADMISSION 10/29/2018 This is a patient I saw 1 time at [facility] health wound care center in August 2019. At that point he did not have any open wounds. He does have known PAD and chronic venous insufficiency. He is not a diabetic and currently does not describe claudication-like symptoms. He has not followed up with his arterial disease as far as I can tell. Over the last several months they have noticed wound on the medial aspect of the left second toe and more recently on the lateral aspect of the first toe in the webspace. They had a compound medication that included lidocaine, diclofenac cream and another part of this prescribed although they think that this made this worse. This was apparently for this prescribed by podiatry in [place] although I have no information on this. Importantly the patient does not describe pain or pruritus in his feet. Before the wounds were noticed he was walking 2 blocks around his home with his family without any claudication limiting symptoms. Past medical history includes moderate dementia, sciatica, onychomycosis, chronic venous insufficiency, glaucoma, coronary artery disease, hypertension, hyperlipidemia. Remote CABG. He also has PAD [please see my notes from [facility] 8/19 included above] ABIs in our clinic were 0.72 on the right 0.86 on the left. These are actually stable to improved to what we recorded last year 7/22; the patient's wound on the lateral part of the left first toe has opened [was blistered last week]. In addition he has the punched-out area in the first second toe webspace. There was apparently concern on the right first toe last week but this is not open this week. There is no concerning area in this location. We use silver alginate with gauze  spreading the toes. X-ray was negative DRU, PRIMEAU ([id number]) Objective Constitutional Patient is hypertensive.. Pulse regular and within target range for patient.Marland Kitchen Respirations regular, non-labored and within target range.. Temperature is normal and within the target range for the patient.Marland Kitchen appears in no distress. Vitals Time Taken: 9:10 AM, Height: 69 in, Weight: 150 lbs, BMI: 22.1, Temperature: 98.2 F, Pulse: 59 bpm, Respiratory Rate: 16 breaths/min, Blood Pressure: 168/57 mmHg. Respiratory Respiratory effort is easy and symmetric bilaterally. Rate is normal at rest and on room air.. Cardiovascular Pedal pulses absent bilaterally. However his feet are warm. Musculoskeletal No abnormalities in the small joints of either the left first or second toe. Psychiatric No evidence of depression, anxiety, or agitation. Calm, cooperative, and communicative. Appropriate interactions and affect.. General Notes: Wound exam; the major problem is the left first and second toes. Lateral aspect of the first toe the blister here has opened but fortunately the tissue underneath does not look as bad as I feared. Reasonably healthy looking. The area on the medial part of the second toe has a reasonably clean base. As opposed to last week did not require any debridement. It has however punched out. Undermining in this area from roughly 6-9 o'clock Integumentary (Hair, Skin) There are no cutaneous issues. I did not feel there was any evidence of tinea pedis. Wound #1 status is Open. Original cause of wound was Gradually Appeared. The wound is located on the [employer]. The wound measures 1cm length x 1.5cm width x 0.1cm depth; 1.178cm^2 area and 0.118cm^3 volume. There is Fat Layer (Subcutaneous Tissue) Exposed exposed. There is no tunneling or undermining noted. There is a medium amount of serous drainage  noted. The wound margin is flat and intact. There is medium (34-66%) pink granulation  within the wound bed. There is a small (1-33%) amount of necrotic tissue within the wound bed including Adherent Slough. Wound #2 status is Open. Original cause of wound was Gradually Appeared. The wound is located on the Left,Medial Toe Second. The wound measures 0.5cm length x 0.7cm width x 0.4cm depth; 0.275cm^2 area and 0.11cm^3 volume. There is Fat Layer (Subcutaneous Tissue) Exposed exposed. There is no tunneling noted, however, there is undermining starting at 3:00 and ending at 9:00 with a maximum distance of 0.3cm. There is a medium amount of serous drainage noted. The wound margin is flat and intact. There is medium (34-66%) pink granulation within the wound bed. There is a small (1-33%) amount of necrotic tissue within the wound bed including Adherent Slough. Wound #3 status is Healed - Epithelialized. Original cause of wound was Gradually Appeared. The wound is located on the Right Toe Great. The wound measures 0cm length x 0cm width x 0cm depth; 0cm^2 area and 0cm^3 volume. The wound is limited to skin breakdown. There is no tunneling or undermining noted. There is a medium amount of serous drainage noted. The wound margin is flat and intact. There is medium (34-66%) pink granulation within the wound bed. There is a small (1-33%) amount of necrotic tissue within the wound bed. Wound #4 status is Open. Original cause of wound was Gradually Appeared. The wound is located on the Right Toe Second. The wound measures 0.1cm length x 0.1cm width x 0.1cm depth; 0.008cm^2 area and 0.001cm^3 volume. The wound is TOBIAH, CELESTINE ([id number]) limited to skin breakdown. There is no tunneling or undermining noted. There is a none present amount of drainage noted. The wound margin is flat and intact. There is no granulation within the wound bed. There is no necrotic tissue within the wound bed. Assessment Active Problems ICD-10 Pressure ulcer of other site, stage 3 Pressure ulcer of other site,  unstageable Other atherosclerosis of native arteries of extremities, left leg Plan Wound Cleansing: Wound #1 Left,Medial Toe Great: Dial antibacterial soap, wash wounds, rinse and pat dry prior to dressing wounds - clean between toes daily Wound #2 Left,Medial Toe Second: Dial antibacterial soap, wash wounds, rinse and pat dry prior to dressing wounds - clean between toes daily Wound #4 Right Toe Second: Dial antibacterial soap, wash wounds, rinse and pat dry prior to dressing wounds - clean between toes daily Anesthetic (add to Medication List): Wound #1 Left,Medial Toe Great: Topical Lidocaine 4% cream applied to wound bed prior to debridement (In Clinic Only). Wound #2 Left,Medial Toe Second: Topical Lidocaine 4% cream applied to wound bed prior to debridement (In Clinic Only). Wound #4 Right Toe Second: Topical Lidocaine 4% cream applied to wound bed prior to debridement (In Clinic Only). Primary Wound Dressing: Wound #1 Left,Medial Toe Great: Silver Alginate Wound #2 Left,Medial Toe Second: Silver Alginate Wound #4 Right Toe Second: Silver Alginate Secondary Dressing: Wound #1 Left,Medial Toe Great: Gauze and Kerlix/Conform - Lambs wool between toes Wound #2 Left,Medial Toe Second: Gauze and Kerlix/Conform - Lambs wool between toes Wound #4 Right Toe Second: Gauze and Kerlix/Conform - Lambs wool between toes Dressing Change Frequency: Wound #1 Left,Medial Toe Great: Change dressing every day. Wound #2 Left,Medial Toe Second: Change dressing every day. Wound #4 Right Toe Second: Change dressing every day. ABUBAKR, WIEMAN ([id number]) Follow-up Appointments: Wound #1 Left,Medial Toe Great: Return Appointment in 1 week. Wound #2 Left,Medial Toe  Second: Return Appointment in 1 week. Wound #4 Right Toe Second: Return Appointment in 1 week. Additional Orders / Instructions: Wound #1 Left,Medial Toe Great: Activity as tolerated Wound #2 Left,Medial Toe Second: Activity  as tolerated Wound #4 Right Toe Second: Activity as tolerated 1. We continued with silver alginate change daily 2. Lambswool between toes/gauze 3. The patient has known PAD but at this point I do not believe this is playing a primary role and as long as these wounds progress towards healing I am not planning to reinvestigate this. 4. No evidence of infection at any wound site Electronic Signature(s) Signed: 11/05/2018 10:10:40 AM By: Linton Ham MD Entered By: Linton Ham on 11/05/2018 10:10:39 [name] ([id number]) -------------------------------------------------------------------------------- [place] Details Patient Name: [name] Date of Service: 11/05/2018 Medical Record Number: [id number] Patient Account Number: [id number] Date of Birth/Sex: [birth year] (85 y.o. M) Treating RN: Cornell Barman Primary Care Provider: Lew Dawes Other Clinician: Referring Provider: Lew Dawes Treating Provider/Extender: Tito Dine in Treatment: 1 Diagnosis Coding ICD-10 Codes Code Description [phone] Pressure ulcer of other site, stage 3 L89.890 Pressure ulcer of other site, unstageable I70.292 Other atherosclerosis of native arteries of extremities, left leg Facility Procedures CPT4 Code: [id number] Description: 99213 - WOUND CARE VISIT-LEV 3 EST PT Modifier: Quantity: 1 Physician Procedures CPT4 Code: [id number] Description: 84128 - WC PHYS LEVEL 3 - EST PT ICD-10 Diagnosis Description L89.893 Pressure ulcer of other site, stage 3 L89.890 Pressure ulcer of other site, unstageable I70.292 Other atherosclerosis of native arteries of extremities, left Modifier: leg Quantity: 1 Electronic Signature(s) Signed: 11/05/2018 4:35:36 PM By: Linton Ham MD Entered By: Linton Ham on 11/05/2018 09:50:03

## 2018-11-06 NOTE — Progress Notes (Signed)
KEIJUAN, SCHELLHASE ([id number]) Visit Report for 11/05/2018 Arrival Information Details Patient Name: [name] [name], [name] [name] Date of Service: 11/05/2018 9:00 AM Medical Record Number: [id number] Patient Account Number: [id number] Date of Birth/Sex: [birth year] (85 y.o. Male) Treating RN: Harold Barban Primary Care Latressa Harries: Lew Dawes Other Clinician: Referring Gedalya Jim: Lew Dawes Treating Novalyn Lajara/Extender: Tito Dine in Treatment: 1 Visit Information History Since Last Visit Added or deleted any medications: No Patient Arrived: Ambulatory Any new allergies or adverse reactions: No Arrival Time: 09:12 Had a fall or experienced change in No Accompanied By: daughter activities of daily living that may affect Transfer Assistance: None risk of falls: Patient Identification Verified: Yes Signs or symptoms of abuse/neglect since last visito No Secondary Verification Process Completed: Yes Hospitalized since last visit: No Has Dressing in Place as Prescribed: Yes Pain Present Now: No Electronic Signature(s) Signed: 11/06/2018 3:44:22 PM By: Harold Barban Entered By: Harold Barban on 11/05/2018 09:12:31 [name] ([id number]) -------------------------------------------------------------------------------- Clinic Level of Care Assessment Details Patient Name: [name] Date of Service: 11/05/2018 9:00 AM Medical Record Number: [id number] Patient Account Number: [id number] Date of Birth/Sex: 1932/05/27 (85 y.o. Male) Treating RN: Cornell Barman Primary Care Trevante Tennell: Lew Dawes Other Clinician: Referring Edmund Rick: Lew Dawes Treating Keifer Habib/Extender: Tito Dine in Treatment: 1 Clinic Level of Care Assessment Items TOOL 4 Quantity Score []  - Use when only an EandM is performed on FOLLOW-UP visit 0 ASSESSMENTS - Nursing Assessment / Reassessment []  - Reassessment of Co-morbidities (includes updates in patient status) 0 X- 1  5 Reassessment of Adherence to Treatment Plan ASSESSMENTS - Wound and Skin Assessment / Reassessment []  - Simple Wound Assessment / Reassessment - one wound 0 X- 3 5 Complex Wound Assessment / Reassessment - multiple wounds []  - 0 Dermatologic / Skin Assessment (not related to wound area) ASSESSMENTS - Focused Assessment []  - Circumferential Edema Measurements - multi extremities 0 []  - 0 Nutritional Assessment / Counseling / Intervention []  - 0 Lower Extremity Assessment (monofilament, tuning fork, pulses) []  - 0 Peripheral Arterial Disease Assessment (using hand held doppler) ASSESSMENTS - Ostomy and/or Continence Assessment and Care []  - Incontinence Assessment and Management 0 []  - 0 Ostomy Care Assessment and Management (repouching, etc.) PROCESS - Coordination of Care X - Simple Patient / Family Education for ongoing care 1 15 []  - 0 Complex (extensive) Patient / Family Education for ongoing care X- 1 10 Staff obtains Programmer, systems, Records, Test Results / Process Orders []  - 0 Staff telephones HHA, Nursing Homes / Clarify orders / etc []  - 0 Routine Transfer to another Facility (non-emergent condition) []  - 0 Routine Hospital Admission (non-emergent condition) []  - 0 New Admissions / Biomedical engineer / Ordering NPWT, Apligraf, etc. []  - 0 Emergency Hospital Admission (emergent condition) X- 1 10 Simple Discharge Coordination DAMIEN, BATTY ([id number]) []  - 0 Complex (extensive) Discharge Coordination PROCESS - Special Needs []  - Pediatric / Minor Patient Management 0 []  - 0 Isolation Patient Management []  - 0 Hearing / Language / Visual special needs []  - 0 Assessment of Community assistance (transportation, D/C planning, etc.) []  - 0 Additional assistance / Altered mentation []  - 0 Support Surface(s) Assessment (bed, cushion, seat, etc.) INTERVENTIONS - Wound Cleansing / Measurement []  - Simple Wound Cleansing - one wound 0 X- 3 5 Complex Wound  Cleansing - multiple wounds X- 1 5 Wound Imaging (photographs - any number of wounds) []  - 0 Wound Tracing (instead of photographs) []  - 0 Simple Wound Measurement - one  wound X- 3 5 Complex Wound Measurement - multiple wounds INTERVENTIONS - Wound Dressings []  - Small Wound Dressing one or multiple wounds 0 []  - 0 Medium Wound Dressing one or multiple wounds X- 1 20 Large Wound Dressing one or multiple wounds []  - 0 Application of Medications - topical []  - 0 Application of Medications - injection INTERVENTIONS - Miscellaneous []  - External ear exam 0 []  - 0 Specimen Collection (cultures, biopsies, blood, body fluids, etc.) []  - 0 Specimen(s) / Culture(s) sent or taken to Lab for analysis []  - 0 Patient Transfer (multiple staff / Civil Service fast streamer / Similar devices) []  - 0 Simple Staple / Suture removal (25 or less) []  - 0 Complex Staple / Suture removal (26 or more) []  - 0 Hypo / Hyperglycemic Management (close monitor of Blood Glucose) []  - 0 Ankle / Brachial Index (ABI) - do not check if billed separately X- 1 5 Vital Signs LIBAN, GUEDES ([id number]) Has the patient been seen at the hospital within the last three years: Yes Total Score: 115 Level Of Care: New/Established - Level 3 Electronic Signature(s) Signed: 11/05/2018 4:45:53 PM By: Gretta Cool, BSN, RN, CWS, Kim RN, BSN Entered By: Gretta Cool, BSN, RN, CWS, Kim on 11/05/2018 09:35:13 [name] ([id number]) -------------------------------------------------------------------------------- Encounter Discharge Information Details Patient Name: [name] Date of Service: 11/05/2018 9:00 AM Medical Record Number: [id number] Patient Account Number: [id number] Date of Birth/Sex: December 02, 1932 (85 y.o. Male) Treating RN: Cornell Barman Primary Care Madeleine Fenn: Lew Dawes Other Clinician: Referring Evita Merida: Lew Dawes Treating Canaan Prue/Extender: Tito Dine in Treatment: 1 Encounter Discharge  Information Items Discharge Condition: Stable Ambulatory Status: Ambulatory Discharge Destination: Home Transportation: Private Auto Accompanied By: daughter Schedule Follow-up Appointment: Yes Clinical Summary of Care: Electronic Signature(s) Signed: 11/05/2018 4:45:53 PM By: Gretta Cool, BSN, RN, CWS, Kim RN, BSN Entered By: Gretta Cool, BSN, RN, CWS, Kim on 11/05/2018 09:47:25 [name] ([id number]) -------------------------------------------------------------------------------- Lower Extremity Assessment Details Patient Name: [name] Date of Service: 11/05/2018 9:00 AM Medical Record Number: [id number] Patient Account Number: [id number] Date of Birth/Sex: 1932-06-06 (85 y.o. Male) Treating RN: Harold Barban Primary Care Alekxander Isola: Lew Dawes Other Clinician: Referring Ryshawn Sanzone: Lew Dawes Treating Rogina Schiano/Extender: Ricard Dillon Weeks in Treatment: 1 Vascular Assessment Pulses: Dorsalis Pedis Palpable: [Left:Yes] [Right:Yes] Posterior Tibial Palpable: [Left:Yes] [Right:Yes] Electronic Signature(s) Signed: 11/06/2018 3:44:22 PM By: Harold Barban Entered By: Harold Barban on 11/05/2018 09:26:32 [name] ([id number]) -------------------------------------------------------------------------------- Multi Wound Chart Details Patient Name: [name] Date of Service: 11/05/2018 9:00 AM Medical Record Number: [id number] Patient Account Number: [id number] Date of Birth/Sex: 12-19-1932 (85 y.o. Male) Treating RN: Cornell Barman Primary Care Chord Takahashi: Lew Dawes Other Clinician: Referring Zakry Caso: Lew Dawes Treating Rubie Ficco/Extender: Ricard Dillon Weeks in Treatment: 1 Vital Signs Height(in): 69 Pulse(bpm): 59 Weight(lbs): 150 Blood Pressure(mmHg): 168/57 Body Mass Index(BMI): 22 Temperature(F): 98.2 Respiratory Rate 16 (breaths/min): Photos: Wound Location: Left Toe Great - Medial Left Toe Second - Medial Right Toe  Great Wounding Event: Gradually Appeared Gradually Appeared Gradually Appeared Primary Etiology: Pressure Ulcer Pressure Ulcer Pressure Ulcer Secondary Etiology: MASD o Moisture Associated MASD o Moisture Associated MASD o Moisture Associated Skin Damage Skin Damage Skin Damage Comorbid History: Cataracts, Glaucoma, Cataracts, Glaucoma, Cataracts, Glaucoma, Peripheral Arterial Disease, Peripheral Arterial Disease, Peripheral Arterial Disease, Gout Gout Gout Date Acquired: 06/29/2018 06/29/2018 06/29/2018 Weeks of Treatment: 1 1 1  Wound Status: Open Open Healed - Epithelialized Measurements L x W x D 1x1.5x0.1 0.5x0.7x0.4 0x0x0 (cm) Area (cm) : 1.178 0.275 0 Volume (cm) :  0.118 0.11 0 % Reduction in Area: 0.00% 76.70% 100.00% % Reduction in Volume: 0.00% 76.60% 100.00% Starting Position 1 3 (o'clock): Ending Position 1 9 (o'clock): Maximum Distance 1 (cm): 0.3 Undermining: No Yes No Classification: Unstageable/Unclassified Category/Stage III Category/Stage II Exudate Amount: Medium Medium Medium Exudate Type: Serous Serous Serous Exudate Color: amber amber amber Wound Margin: Flat and Intact Flat and Intact Flat and Intact Granulation Amount: Medium (34-66%) Medium (34-66%) Medium (34-66%) CORDARREL, STIEFEL ([id number]) Granulation Quality: Pink Pink Pink Necrotic Amount: Small (1-33%) Small (1-33%) Small (1-33%) Exposed Structures: Fat Layer (Subcutaneous Fat Layer (Subcutaneous Fascia: No Tissue) Exposed: Yes Tissue) Exposed: Yes Fat Layer (Subcutaneous Fascia: No Fascia: No Tissue) Exposed: No Tendon: No Tendon: No Tendon: No Muscle: No Muscle: No Muscle: No Joint: No Joint: No Joint: No Bone: No Bone: No Bone: No Limited to Skin Breakdown Epithelialization: None None Small (1-33%) Wound Number: 4 N/A N/A Photos: N/A N/A Wound Location: Right Toe Second N/A N/A Wounding Event: Gradually Appeared N/A N/A Primary Etiology: Pressure Ulcer N/A N/A Secondary  Etiology: MASD o Moisture Associated N/A N/A Skin Damage Comorbid History: Cataracts, Glaucoma, N/A N/A Peripheral Arterial Disease, Gout Date Acquired: 06/29/2018 N/A N/A Weeks of Treatment: 1 N/A N/A Wound Status: Open N/A N/A Measurements L x W x D 0.1x0.1x0.1 N/A N/A (cm) Area (cm) : 0.008 N/A N/A Volume (cm) : 0.001 N/A N/A % Reduction in Area: 74.20% N/A N/A % Reduction in Volume: 66.70% N/A N/A Undermining: No N/A N/A Classification: Category/Stage I N/A N/A Exudate Amount: None Present N/A N/A Exudate Type: N/A N/A N/A Exudate Color: N/A N/A N/A Wound Margin: Flat and Intact N/A N/A Granulation Amount: None Present (0%) N/A N/A Granulation Quality: N/A N/A N/A Necrotic Amount: None Present (0%) N/A N/A Exposed Structures: Fascia: No N/A N/A Fat Layer (Subcutaneous Tissue) Exposed: No Tendon: No Muscle: No Joint: No Bone: No Limited to Skin Breakdown Epithelialization: None N/A N/A SYLAR, VOONG ([id number]) Treatment Notes Electronic Signature(s) Signed: 11/05/2018 4:35:36 PM By: Linton Ham MD Entered By: Linton Ham on 11/05/2018 09:43:28 [name] ([id number]) -------------------------------------------------------------------------------- Multi-Disciplinary Care Plan Details Patient Name: [name] Date of Service: 11/05/2018 9:00 AM Medical Record Number: [id number] Patient Account Number: [id number] Date of Birth/Sex: 05-Nov-1932 (85 y.o. Male) Treating RN: Cornell Barman Primary Care Tahiri Shareef: Lew Dawes Other Clinician: Referring Marylu Dudenhoeffer: Lew Dawes Treating Selen Smucker/Extender: Tito Dine in Treatment: 1 Active Inactive Abuse / Safety / Falls / Self Care Management Nursing Diagnoses: Potential for falls Goals: Patient will not experience any injury related to falls Date Initiated: 10/29/2018 Target Resolution Date: 11/12/2018 Goal Status: Active Interventions: Provide education on basic  hygiene Treatment Activities: Education provided on Basic Hygiene : 10/29/2018 Notes: Orientation to the Wound Care Program Nursing Diagnoses: Knowledge deficit related to the wound healing center program Goals: Patient/caregiver will verbalize understanding of the [facility] Program Date Initiated: 10/29/2018 Target Resolution Date: 10/29/2018 Goal Status: Active Interventions: Provide education on orientation to the wound center Notes: Pressure Nursing Diagnoses: Potential for impaired tissue integrity related to pressure, friction, moisture, and shear Goals: Patient will remain free of pressure ulcers Date Initiated: 10/29/2018 Target Resolution Date: 11/12/2018 Goal Status: Active WALFRED, BETTENDORF ([id number]) Interventions: Assess potential for pressure ulcer upon admission and as needed Notes: Soft Tissue Infection Nursing Diagnoses: Potential for infection: soft tissue Goals: Patient will remain free of wound infection Date Initiated: 10/29/2018 Target Resolution Date: 11/12/2018 Goal Status: Active Interventions: Assess signs and symptoms of infection every visit  Notes: Wound/Skin Impairment Nursing Diagnoses: Impaired tissue integrity Goals: Ulcer/skin breakdown will have a volume reduction of 30% by week 4 Date Initiated: 10/29/2018 Target Resolution Date: 11/29/2018 Goal Status: Active Interventions: Assess ulceration(s) every visit Treatment Activities: Skin care regimen initiated : 10/29/2018 Topical wound management initiated : 10/29/2018 Notes: Electronic Signature(s) Signed: 11/05/2018 4:45:53 PM By: Gretta Cool, BSN, RN, CWS, Kim RN, BSN Entered By: Gretta Cool, BSN, RN, CWS, Kim on 11/05/2018 09:31:31 [name] ([id number]) -------------------------------------------------------------------------------- Pain Assessment Details Patient Name: [name] Date of Service: 11/05/2018 9:00 AM Medical Record Number: [id number] Patient Account Number:  [id number] Date of Birth/Sex: 1933/01/21 (85 y.o. Male) Treating RN: Harold Barban Primary Care Kahli Mayon: Lew Dawes Other Clinician: Referring Terri Rorrer: Lew Dawes Treating Ladonna Vanorder/Extender: Ricard Dillon Weeks in Treatment: 1 Active Problems Location of Pain Severity and Description of Pain Patient Has Paino No Site Locations Pain Management and Medication Current Pain Management: Electronic Signature(s) Signed: 11/06/2018 3:44:22 PM By: Harold Barban Entered By: Harold Barban on 11/05/2018 09:12:38 [name] ([id number]) -------------------------------------------------------------------------------- Patient/Caregiver Education Details Patient Name: [name] Date of Service: 11/05/2018 9:00 AM Medical Record Number: [id number] Patient Account Number: [id number] Date of Birth/Gender: 03-19-1933 (86 y.o. Male) Treating RN: Cornell Barman Primary Care Physician: Lew Dawes Other Clinician: Referring Physician: Lew Dawes Treating Physician/Extender: Tito Dine in Treatment: 1 Education Assessment Education Provided To: Patient and Caregiver Education Topics Provided Wound/Skin Impairment: Handouts: Caring for Your Ulcer Methods: Demonstration, Explain/Verbal Responses: State content correctly Electronic Signature(s) Signed: 11/05/2018 4:45:53 PM By: Gretta Cool, BSN, RN, CWS, Kim RN, BSN Entered By: Gretta Cool, BSN, RN, CWS, Kim on 11/05/2018 09:35:33 [name] ([id number]) -------------------------------------------------------------------------------- Wound Assessment Details Patient Name: [name] Date of Service: 11/05/2018 9:00 AM Medical Record Number: [id number] Patient Account Number: [id number] Date of Birth/Sex: Jan 23, 1933 (85 y.o. Male) Treating RN: Harold Barban Primary Care Rosha Cocker: Lew Dawes Other Clinician: Referring Graiden Henes: Lew Dawes Treating Ireland Chagnon/Extender: Ricard Dillon Weeks in Treatment: 1 Wound Status Wound Number: 1 Primary Etiology: Pressure Ulcer Wound Location: Left Toe Great - Medial Secondary MASD o Moisture Associated Skin Damage Etiology: Wounding Event: Gradually Appeared Wound Status: Open Date Acquired: 06/29/2018 Comorbid Cataracts, Glaucoma, Peripheral Arterial Weeks Of Treatment: 1 History: Disease, Gout Clustered Wound: No Photos Wound Measurements Length: (cm) 1 % Reduction Width: (cm) 1.5 % Reduction Depth: (cm) 0.1 Epithelializ Area: (cm) 1.178 Tunneling: Volume: (cm) 0.118 Undermining in Area: 0% in Volume: 0% ation: None No : No Wound Description Classification: Unstageable/Unclassified Foul Odor A Wound Margin: Flat and Intact Exudate Amount: Medium Exudate Type: Serous Exudate Color: amber fter Cleansing: No Wound Bed Granulation Amount: Medium (34-66%) Exposed Structure Granulation Quality: Pink Fascia Exposed: No Necrotic Amount: Small (1-33%) Fat Layer (Subcutaneous Tissue) Exposed: Yes Necrotic Quality: Adherent Slough Tendon Exposed: No Muscle Exposed: No Joint Exposed: No Bone Exposed: No Treatment Notes BERLYN, MALINA. ([id number]) Wound #1 (Left, Medial Toe Great) 1. Cleansed with: Cleanse wound with antibacterial soap and water 2. Anesthetic Topical Lidocaine 4% cream to wound bed prior to debridement 4. Dressing Applied: Other dressing (specify in notes) Notes Silvercell, lambs wool between toes, conform to secure, wider shoes needed Electronic Signature(s) Signed: 11/06/2018 3:44:22 PM By: Harold Barban Entered By: Harold Barban on 11/05/2018 09:24:33 [name] ([id number]) -------------------------------------------------------------------------------- Wound Assessment Details Patient Name: [name] Date of Service: 11/05/2018 9:00 AM Medical Record Number: [id number] Patient Account Number: [id number] Date of Birth/Sex: Jun 26, 1932 (85 y.o. Male) Treating  RN: Harold Barban Primary Care Margarit Minshall:  Lew Dawes Other Clinician: Referring Kenrick Pore: Lew Dawes Treating Macsen Nuttall/Extender: Ricard Dillon Weeks in Treatment: 1 Wound Status Wound Number: 2 Primary Etiology: Pressure Ulcer Wound Location: Left Toe Second - Medial Secondary MASD o Moisture Associated Skin Damage Etiology: Wounding Event: Gradually Appeared Wound Status: Open Date Acquired: 06/29/2018 Comorbid Cataracts, Glaucoma, Peripheral Arterial Weeks Of Treatment: 1 History: Disease, Gout Clustered Wound: No Photos Wound Measurements Length: (cm) 0.5 % Reduction i Width: (cm) 0.7 % Reduction i Depth: (cm) 0.4 Epithelializa Area: (cm) 0.275 Tunneling: Volume: (cm) 0.11 Undermining: Starting P Ending Pos Maximum Di n Area: 76.7% n Volume: 76.6% tion: None No Yes osition (o'clock): 3 ition (o'clock): 9 stance: (cm) 0.3 Wound Description Classification: Category/Stage III Foul Odor Aft Wound Margin: Flat and Intact Slough/Fibrin Exudate Amount: Medium Exudate Type: Serous Exudate Color: amber er Cleansing: No o Yes Wound Bed Granulation Amount: Medium (34-66%) Exposed Structure Granulation Quality: Pink Fascia Exposed: No Necrotic Amount: Small (1-33%) Fat Layer (Subcutaneous Tissue) Exposed: Yes Necrotic Quality: Adherent Slough Tendon Exposed: No Muscle Exposed: No Joint Exposed: No NIJEL, FLINK L. ([id number]) Bone Exposed: No Treatment Notes Wound #2 (Left, Medial Toe Second) 1. Cleansed with: Cleanse wound with antibacterial soap and water 2. Anesthetic Topical Lidocaine 4% cream to wound bed prior to debridement 4. Dressing Applied: Other dressing (specify in notes) Notes Silvercell, lambs wool between toes, conform to secure, wider shoes needed Electronic Signature(s) Signed: 11/06/2018 3:44:22 PM By: Harold Barban Entered By: Harold Barban on 11/05/2018 09:25:15 [name]  ([id number]) -------------------------------------------------------------------------------- Wound Assessment Details Patient Name: [name] Date of Service: 11/05/2018 9:00 AM Medical Record Number: [id number] Patient Account Number: [id number] Date of Birth/Sex: [birth year] (85 y.o. Male) Treating RN: Cornell Barman Primary Care Tiannah Greenly: Lew Dawes Other Clinician: Referring Dasani Thurlow: Lew Dawes Treating Deforrest Bogle/Extender: Ricard Dillon Weeks in Treatment: 1 Wound Status Wound Number: 3 Primary Etiology: Pressure Ulcer Wound Location: Right Toe Great Secondary MASD o Moisture Associated Skin Damage Etiology: Wounding Event: Gradually Appeared Wound Status: Healed - Epithelialized Date Acquired: 06/29/2018 Comorbid Cataracts, Glaucoma, Peripheral Arterial Weeks Of Treatment: 1 History: Disease, Gout Clustered Wound: No Photos Wound Measurements Length: (cm) 0 % Reducti Width: (cm) 0 % Reducti Depth: (cm) 0 Epithelia Area: (cm) 0 Tunnelin Volume: (cm) 0 Undermin on in Area: 100% on in Volume: 100% lization: Small (1-33%) g: No ing: No Wound Description Classification: Category/Stage II Foul Odor Wound Margin: Flat and Intact Slough/Fi Exudate Amount: Medium Exudate Type: Serous Exudate Color: amber After Cleansing: No brino Yes Wound Bed Granulation Amount: Medium (34-66%) Exposed Structure Granulation Quality: Pink Fascia Exposed: No Necrotic Amount: Small (1-33%) Fat Layer (Subcutaneous Tissue) Exposed: No Tendon Exposed: No Muscle Exposed: No Joint Exposed: No Bone Exposed: No Limited to Skin Breakdown MARTEL, GALVAN ([id number]) Electronic Signature(s) Signed: 11/05/2018 4:45:53 PM By: Gretta Cool, BSN, RN, CWS, Kim RN, BSN Entered By: Gretta Cool, BSN, RN, CWS, Kim on 11/05/2018 09:32:53 [name] ([id number]) -------------------------------------------------------------------------------- Wound Assessment Details Patient Name: [name] Date of Service: 11/05/2018 9:00 AM Medical Record Number: [id number] Patient Account Number: [id number] Date of Birth/Sex: 1933-01-25 (85 y.o. Male) Treating RN: Harold Barban Primary Care Cephas Revard: Lew Dawes Other Clinician: Referring Danira Nylander: Lew Dawes Treating Traycen Goyer/Extender: Ricard Dillon Weeks in Treatment: 1 Wound Status Wound Number: 4 Primary Etiology: Pressure Ulcer Wound Location: Right Toe Second Secondary MASD o Moisture Associated Skin Damage Etiology: Wounding Event: Gradually Appeared Wound Status: Open Date Acquired: 06/29/2018 Comorbid Cataracts, Glaucoma, Peripheral Arterial Weeks Of Treatment: 1 History:  Disease, Gout Clustered Wound: No Photos Wound Measurements Length: (cm) 0.1 % Reduction i Width: (cm) 0.1 % Reduction i Depth: (cm) 0.1 Epithelializa Area: (cm) 0.008 Tunneling: Volume: (cm) 0.001 Undermining: n Area: 74.2% n Volume: 66.7% tion: None No No Wound Description Classification: Category/Stage I Foul Odor Aft Wound Margin: Flat and Intact Slough/Fibrin Exudate Amount: None Present er Cleansing: No o No Wound Bed Granulation Amount: None Present (0%) Exposed Structure Necrotic Amount: None Present (0%) Fascia Exposed: No Fat Layer (Subcutaneous Tissue) Exposed: No Tendon Exposed: No Muscle Exposed: No Joint Exposed: No Bone Exposed: No Limited to Skin Breakdown Treatment Notes Wound #4 (Right Toe Second) Aaron, Emet L. ([id number]) 1. Cleansed with: Cleanse wound with antibacterial soap and water 2. Anesthetic Topical Lidocaine 4% cream to wound bed prior to debridement 4. Dressing Applied: Other dressing (specify in notes) Notes Silvercell, lambs wool between toes, conform to secure, wider shoes needed Electronic Signature(s) Signed: 11/06/2018 3:44:22 PM By: Harold Barban Entered By: Harold Barban on 11/05/2018 09:26:14 [name]  ([id number]) -------------------------------------------------------------------------------- Vitals Details Patient Name: [name] Date of Service: 11/05/2018 9:00 AM Medical Record Number: [id number] Patient Account Number: [id number] Date of Birth/Sex: [birth year] (85 y.o. Male) Treating RN: Harold Barban Primary Care Sharni Negron: Lew Dawes Other Clinician: Referring Ramona Ruark: Lew Dawes Treating Kimball Appleby/Extender: Ricard Dillon Weeks in Treatment: 1 Vital Signs Time Taken: 09:10 Temperature (F): 98.2 Height (in): 69 Pulse (bpm): 59 Weight (lbs): 150 Respiratory Rate (breaths/min): 16 Body Mass Index (BMI): 22.1 Blood Pressure (mmHg): 168/57 Reference Range: 80 - 120 mg / dl Electronic Signature(s) Signed: 11/06/2018 3:44:22 PM By: Harold Barban Entered By: Harold Barban on 11/05/2018 09:12:54

## 2018-11-12 ENCOUNTER — Other Ambulatory Visit: Payer: Self-pay

## 2018-11-12 ENCOUNTER — Encounter: Payer: Medicare Other | Admitting: Internal Medicine

## 2018-11-12 DIAGNOSIS — I2510 Atherosclerotic heart disease of native coronary artery without angina pectoris: Secondary | ICD-10-CM | POA: Diagnosis not present

## 2018-11-12 DIAGNOSIS — L89893 Pressure ulcer of other site, stage 3: Secondary | ICD-10-CM | POA: Diagnosis not present

## 2018-11-12 DIAGNOSIS — L89890 Pressure ulcer of other site, unstageable: Secondary | ICD-10-CM | POA: Diagnosis not present

## 2018-11-12 DIAGNOSIS — F0390 Unspecified dementia without behavioral disturbance: Secondary | ICD-10-CM | POA: Diagnosis not present

## 2018-11-12 DIAGNOSIS — I70202 Unspecified atherosclerosis of native arteries of extremities, left leg: Secondary | ICD-10-CM | POA: Diagnosis not present

## 2018-11-12 DIAGNOSIS — I872 Venous insufficiency (chronic) (peripheral): Secondary | ICD-10-CM | POA: Diagnosis not present

## 2018-11-13 NOTE — Progress Notes (Signed)
[name] [name], [name] [name] ([id number]) Visit Report for 11/12/2018 Debridement Details Patient Name: [name] [name], [name] [name] Date of Service: 11/12/2018 9:00 AM Medical Record Number: [id number] Patient Account Number: [id number] Date of Birth/Sex: 1932/09/15 (85 y.o. M) Treating RN: Harold Barban Primary Care Provider: Lew Dawes Other Clinician: Referring Provider: Lew Dawes Treating Provider/Extender: Tito Dine in Treatment: 2 Debridement Performed for Wound #2 Left,Medial Toe Second Assessment: Performed By: Physician Ricard Dillon, MD Debridement Type: Debridement Level of Consciousness (Pre- Awake and Alert procedure): Pre-procedure Verification/Time Yes - 09:20 Out Taken: Start Time: 09:20 Pain Control: Lidocaine Total Area Debrided (L x W): 0.6 (cm) x 0.6 (cm) = 0.36 (cm) Tissue and other material Viable, Non-Viable, Slough, Subcutaneous, Slough debrided: Level: Skin/Subcutaneous Tissue Debridement Description: Excisional Instrument: Curette Bleeding: Minimum Hemostasis Achieved: Pressure End Time: 09:21 Procedural Pain: 0 Post Procedural Pain: 0 Response to Treatment: Procedure was tolerated well Level of Consciousness Awake and Alert (Post-procedure): Post Debridement Measurements of Total Wound Length: (cm) 0.6 Stage: Category/Stage III Width: (cm) 0.6 Depth: (cm) 0.2 Volume: (cm) 0.057 Character of Wound/Ulcer Post Improved Debridement: Post Procedure Diagnosis Same as Pre-procedure Electronic Signature(s) Signed: 11/12/2018 4:53:39 PM By: Harold Barban Signed: 11/12/2018 5:12:30 PM By: Linton Ham MD Entered By: Linton Ham on 11/12/2018 09:28:26 [name] [name] ([id number]) -------------------------------------------------------------------------------- HPI Details Patient Name: [name] [name] Date of Service: 11/12/2018 9:00 AM Medical Record Number: [id number] Patient Account Number: [id number] Date of Birth/Sex:  1932-05-09 (85 y.o. M) Treating RN: Harold Barban Primary Care Provider: Lew Dawes Other Clinician: Referring Provider: Lew Dawes Treating Provider/Extender: Tito Dine in Treatment: 2 History of Present Illness HPI Description: [place] 12/2017 This is an 85 year old man who is accompanied by his daughter today. They're largely here for concerns for discoloration in his bilateral lower legs. This is apparently been getting worse over the last year and is more extensive on the right lower leg. He also developed swelling of his legs but no real pain. He had DVT rule out studies in May of this year that were negative bilaterally. The patient has not had reflux studies. He does wear compression stockings although we are not exactly sure what strength compression. Recently his primary care doctor told the patient not to wear these for bit of time and so they are wearing them currently. Looking through [facility] link the patient had arterial studies done in 2015. This showed aortoiliac disease and a greater than 650% right SFA stenosis. He had 3 vessel runoff. He was reviewed by Dr. Fletcher Anon in March 2016 at that point he quoted a right ABI of 0.77 and 0.86 on the left. There was evidence of inflow disease based on waveforms. Duplex showed significant bilateral SFA disease. As he was reasonably asymptomatic it was recommended he have medical therapy. That was based on the absence of claudication. Currently the patient states he can walk around the block and walks around with his daughter in department stores etc. He does not describe claudication does not seem to be limited by any way I can determine. The patient is not a diabetic. He has a remote coronary artery bypass that used to vein out of the right leg. He is on Lasix. ABIs in our clinic were 0.51 on the right and 0.71 on the left ADMISSION 10/29/2018 This is a patient I saw 1 time at [facility] in August 2019. At that point he did not have any open wounds. He does have known PAD and chronic  venous insufficiency. He is not a diabetic and currently does not describe claudication-like symptoms. He has not followed up with his arterial disease as far as I can tell. Over the last several months they have noticed wound on the medial aspect of the left second toe and more recently on the lateral aspect of the first toe in the webspace. They had a compound medication that included lidocaine, diclofenac cream and another part of this prescribed although they think that this made this worse. This was apparently for this prescribed by podiatry in [place] although I have no information on this. Importantly the patient does not describe pain or pruritus in his feet. Before the wounds were noticed he was walking 2 blocks around his home with his family without any claudication limiting symptoms. Past medical history includes moderate dementia, sciatica, onychomycosis, chronic venous insufficiency, glaucoma, coronary artery disease, hypertension, hyperlipidemia. Remote CABG. He also has PAD [please see my notes from [facility] 8/19 included above] ABIs in our clinic were 0.72 on the right 0.86 on the left. These are actually stable to improved to what we recorded last year 7/22; the patient's wound on the lateral part of the left first toe has opened [was blistered last week]. In addition he has the punched-out area in the first second toe webspace. There was apparently concern on the right first toe last week but this is not open this week. There is no concerning area in this location. We use silver alginate with gauze spreading the toes. X-ray was negative 7/29; both open wounds are in the left first and second toe webspace. The area on the first toe looks better. He still has a deep punched out area over the PIP of the left second toe. There is nothing open on the right. We have  been using silver [name] [name], [name] [name] ([id number]) alginate His daughter relates that his ambulatory status is somewhat worse. It is difficult to get him to describe claudication although he is describing some discomfort in the right leg with walking and on the left second toe. He does have known PAD Electronic Signature(s) Signed: 11/12/2018 5:12:30 PM By: Linton Ham MD Entered By: Linton Ham on 11/12/2018 09:31:33 [name] [name] ([id number]) -------------------------------------------------------------------------------- Physical Exam Details Patient Name: [name] [name] Date of Service: 11/12/2018 9:00 AM Medical Record Number: [id number] Patient Account Number: [id number] Date of Birth/Sex: [birth year] (85 y.o. M) Treating RN: Harold Barban Primary Care Provider: Lew Dawes Other Clinician: Referring Provider: Lew Dawes Treating Provider/Extender: Tito Dine in Treatment: 2 Constitutional Sitting or standing Blood Pressure is within target range for patient.. Pulse regular and within target range for patient.Marland Kitchen Respirations regular, non-labored and within target range.. Temperature is normal and within the target range for the patient.Marland Kitchen appears in no distress. Notes Wound exam; left first and second toe webspace. The lateral first toe wound looks better. This is more superficial no debridement was necessary. He had surface slough which was adherent on the second toe. I removed this with a #3 curette hemostasis with direct pressure. His wound is relatively deep there is no exposed bone and no overt surrounding infection. There was some question about tinea when he first came into the clinic I do not think that was the issue Electronic Signature(s) Signed: 11/12/2018 5:12:30 PM By: Linton Ham MD Entered By: Linton Ham on 11/12/2018 09:33:53 [name] [name]  ([id number]) -------------------------------------------------------------------------------- Physician Orders Details Patient Name: [name] [name] Date of Service: 11/12/2018 9:00 AM Medical Record Number:  [id number] Patient Account Number: [id number] Date of Birth/Sex: 1932/09/22 (85 y.o. M) Treating RN: Harold Barban Primary Care Provider: Lew Dawes Other Clinician: Referring Provider: Lew Dawes Treating Provider/Extender: Tito Dine in Treatment: 2 Verbal / Phone Orders: No Diagnosis Coding Wound Cleansing Wound #1 Left,Medial Toe Great o Dial antibacterial soap, wash wounds, rinse and pat dry prior to dressing wounds - clean between toes daily Wound #2 Left,Medial Toe Second o Dial antibacterial soap, wash wounds, rinse and pat dry prior to dressing wounds - clean between toes daily Anesthetic (add to Medication List) Wound #1 Left,Medial Toe Great o Topical Lidocaine 4% cream applied to wound bed prior to debridement (In Clinic Only). Wound #2 Left,Medial Toe Second o Topical Lidocaine 4% cream applied to wound bed prior to debridement (In Clinic Only). Primary Wound Dressing Wound #1 Left,Medial Toe Great o Silver Alginate Wound #2 Left,Medial Toe Second o Silver Alginate o Collagen - Place moistened collagen to wound bed Secondary Dressing Wound #1 Left,Medial Toe Great o Gauze and Kerlix/Conform - Lambs wool between toes Wound #2 Left,Medial Toe Second o Gauze and Kerlix/Conform - Lambs wool between toes Dressing Change Frequency Wound #1 Left,Medial Toe Great o Change dressing every other day. Wound #2 Left,Medial Toe Second o Change dressing every other day. Follow-up Appointments Wound #1 Left,Medial Toe Great o Return Appointment in 1 week. Wound #2 Left,Medial Toe Second o Return Appointment in 1 week. [name] [name], [name] [name] ([id number]) Additional Orders / Instructions Wound #1 Left,Medial Toe Great o  Activity as tolerated Wound #2 Left,Medial Toe Second o Activity as tolerated Electronic Signature(s) Signed: 11/12/2018 4:53:39 PM By: Harold Barban Signed: 11/12/2018 5:12:30 PM By: Linton Ham MD Entered By: Harold Barban on 11/12/2018 09:24:25 [name] [name] ([id number]) -------------------------------------------------------------------------------- Problem List Details Patient Name: [name] [name] Date of Service: 11/12/2018 9:00 AM Medical Record Number: [id number] Patient Account Number: [id number] Date of Birth/Sex: March 29, 1933 (85 y.o. M) Treating RN: Harold Barban Primary Care Provider: Lew Dawes Other Clinician: Referring Provider: Lew Dawes Treating Provider/Extender: Tito Dine in Treatment: 2 Active Problems ICD-10 Evaluated Encounter Code Description Active Date Today Diagnosis L89.893 Pressure ulcer of other site, stage 3 10/29/2018 No Yes L89.890 Pressure ulcer of other site, unstageable 10/29/2018 No Yes I70.292 Other atherosclerosis of native arteries of extremities, left leg 10/29/2018 No Yes Inactive Problems Resolved Problems Electronic Signature(s) Signed: 11/12/2018 5:12:30 PM By: Linton Ham MD Entered By: Linton Ham on 11/12/2018 09:27:32 [name] [name] ([id number]) -------------------------------------------------------------------------------- Progress Note Details Patient Name: [name] [name] Date of Service: 11/12/2018 9:00 AM Medical Record Number: [id number] Patient Account Number: [id number] Date of Birth/Sex: July 16, 1932 (85 y.o. M) Treating RN: Harold Barban Primary Care Provider: Lew Dawes Other Clinician: Referring Provider: Lew Dawes Treating Provider/Extender: Tito Dine in Treatment: 2 Subjective History of Present Illness (HPI) [place] 12/2017 This is an 85 year old man who is accompanied by his daughter today. They're largely here for concerns for  discoloration in his bilateral lower legs. This is apparently been getting worse over the last year and is more extensive on the right lower leg. He also developed swelling of his legs but no real pain. He had DVT rule out studies in May of this year that were negative bilaterally. The patient has not had reflux studies. He does wear compression stockings although we are not exactly sure what strength compression. Recently his primary care doctor told the patient not to wear these for bit of time and so they are wearing them currently.  Looking through [facility] link the patient had arterial studies done in 2015. This showed aortoiliac disease and a greater than 650% right SFA stenosis. He had 3 vessel runoff. He was reviewed by Dr. Fletcher Anon in March 2016 at that point he quoted a right ABI of 0.77 and 0.86 on the left. There was evidence of inflow disease based on waveforms. Duplex showed significant bilateral SFA disease. As he was reasonably asymptomatic it was recommended he have medical therapy. That was based on the absence of claudication. Currently the patient states he can walk around the block and walks around with his daughter in department stores etc. He does not describe claudication does not seem to be limited by any way I can determine. The patient is not a diabetic. He has a remote coronary artery bypass that used to vein out of the right leg. He is on Lasix. ABIs in our clinic were 0.51 on the right and 0.71 on the left ADMISSION 10/29/2018 This is a patient I saw 1 time at [facility] wound care center in August 2019. At that point he did not have any open wounds. He does have known PAD and chronic venous insufficiency. He is not a diabetic and currently does not describe claudication-like symptoms. He has not followed up with his arterial disease as far as I can tell. Over the last several months they have noticed wound on the medial aspect of the left second toe and more recently  on the lateral aspect of the first toe in the webspace. They had a compound medication that included lidocaine, diclofenac cream and another part of this prescribed although they think that this made this worse. This was apparently for this prescribed by podiatry in [place] although I have no information on this. Importantly the patient does not describe pain or pruritus in his feet. Before the wounds were noticed he was walking 2 blocks around his home with his family without any claudication limiting symptoms. Past medical history includes moderate dementia, sciatica, onychomycosis, chronic venous insufficiency, glaucoma, coronary artery disease, hypertension, hyperlipidemia. Remote CABG. He also has PAD [please see my notes from [facility] 8/19 included above] ABIs in our clinic were 0.72 on the right 0.86 on the left. These are actually stable to improved to what we recorded last year 7/22; the patient's wound on the lateral part of the left first toe has opened [was blistered last week]. In addition he has the punched-out area in the first second toe webspace. There was apparently concern on the right first toe last week but this is not open this week. There is no concerning area in this location. We use silver alginate with gauze spreading the toes. X-ray was negative 7/29; both open wounds are in the left first and second toe webspace. The area on the first toe looks better. He still has a [name] [name], [name] [name]. ([id number]) deep punched out area over the PIP of the left second toe. There is nothing open on the right. We have been using silver alginate His daughter relates that his ambulatory status is somewhat worse. It is difficult to get him to describe claudication although he is describing some discomfort in the right leg with walking and on the left second toe. He does have known PAD Objective Constitutional Sitting or standing Blood Pressure is within target range for  patient.. Pulse regular and within target range for patient.Marland Kitchen Respirations regular, non-labored and within target range.. Temperature is normal and within the target range  for the patient.Marland Kitchen appears in no distress. Vitals Time Taken: 8:50 AM, Height: 69 in, Weight: 150 lbs, BMI: 22.1, Temperature: 97.7 F, Pulse: 61 bpm, Respiratory Rate: 16 breaths/min, Blood Pressure: 144/54 mmHg. General Notes: Wound exam; left first and second toe webspace. The lateral first toe wound looks better. This is more superficial no debridement was necessary. He had surface slough which was adherent on the second toe. I removed this with a #3 curette hemostasis with direct pressure. His wound is relatively deep there is no exposed bone and no overt surrounding infection. There was some question about tinea when he first came into the clinic I do not think that was the issue Integumentary (Hair, Skin) Wound #1 status is Open. Original cause of wound was Gradually Appeared. The wound is located on the [employer]. The wound measures 0.5cm length x 0.5cm width x 0.1cm depth; 0.196cm^2 area and 0.02cm^3 volume. There is Fat Layer (Subcutaneous Tissue) Exposed exposed. There is no tunneling or undermining noted. There is a medium amount of serous drainage noted. The wound margin is flat and intact. There is small (1-33%) pink granulation within the wound bed. There is a large (67-100%) amount of necrotic tissue within the wound bed including Adherent Slough. Wound #2 status is Open. Original cause of wound was Gradually Appeared. The wound is located on the Left,Medial Toe Second. The wound measures 0.6cm length x 0.6cm width x 0.2cm depth; 0.283cm^2 area and 0.057cm^3 volume. There is Fat Layer (Subcutaneous Tissue) Exposed exposed. There is no tunneling noted, however, there is undermining starting at 1:00 and ending at 1:00 with a maximum distance of 0.3cm. There is a medium amount of serous drainage noted.  The wound margin is flat and intact. There is medium (34-66%) pink granulation within the wound bed. There is a medium (34-66%) amount of necrotic tissue within the wound bed including Adherent Slough. Wound #4 status is Healed - Epithelialized. Original cause of wound was Gradually Appeared. The wound is located on the Right Toe Second. The wound measures 0cm length x 0cm width x 0cm depth; 0cm^2 area and 0cm^3 volume. Assessment Active Problems ICD-10 Pressure ulcer of other site, stage 3 Pressure ulcer of other site, [name] [name], [name] [name]. ([id number]) Other atherosclerosis of native arteries of extremities, left leg Procedures Wound #2 Pre-procedure diagnosis of Wound #2 is a Pressure Ulcer located on the Left,Medial Toe Second . There was a Excisional Skin/Subcutaneous Tissue Debridement with a total area of 0.36 sq cm performed by Ricard Dillon, MD. With the following instrument(s): Curette to remove Viable and Non-Viable tissue/material. Material removed includes Subcutaneous Tissue and Slough and after achieving pain control using Lidocaine. No specimens were taken. A time out was conducted at 09:20, prior to the start of the procedure. A Minimum amount of bleeding was controlled with Pressure. The procedure was tolerated well with a pain level of 0 throughout and a pain level of 0 following the procedure. Post Debridement Measurements: 0.6cm length x 0.6cm width x 0.2cm depth; 0.057cm^3 volume. Post debridement Stage noted as Category/Stage III. Character of Wound/Ulcer Post Debridement is improved. Post procedure Diagnosis Wound #2: Same as Pre-Procedure Plan Wound Cleansing: Wound #1 Left,Medial Toe Great: Dial antibacterial soap, wash wounds, rinse and pat dry prior to dressing wounds - clean between toes daily Wound #2 Left,Medial Toe Second: Dial antibacterial soap, wash wounds, rinse and pat dry prior to dressing wounds - clean between toes daily Anesthetic (add  to Medication List): Wound #1 Left,Medial Toe  Great: Topical Lidocaine 4% cream applied to wound bed prior to debridement (In Clinic Only). Wound #2 Left,Medial Toe Second: Topical Lidocaine 4% cream applied to wound bed prior to debridement (In Clinic Only). Primary Wound Dressing: Wound #1 Left,Medial Toe Great: Silver Alginate Wound #2 Left,Medial Toe Second: Silver Alginate Collagen - Place moistened collagen to wound bed Secondary Dressing: Wound #1 Left,Medial Toe Great: Gauze and Kerlix/Conform - Lambs wool between toes Wound #2 Left,Medial Toe Second: Gauze and Kerlix/Conform - Lambs wool between toes Dressing Change Frequency: Wound #1 Left,Medial Toe Great: Change dressing every other day. Wound #2 Left,Medial Toe Second: Change dressing every other day. Follow-up Appointments: Wound #1 Left,Medial Toe Great: Return Appointment in 1 week. Wound #2 Left,Medial Toe Second: [name] [name], [name] [name] ([id number]) Return Appointment in 1 week. Additional Orders / Instructions: Wound #1 Left,Medial Toe Great: Activity as tolerated Wound #2 Left,Medial Toe Second: Activity as tolerated 1. We put collagen with alginate backing on the left second toe and discontinued with the alginate on the first toe which is not nearly the depth. 2. He has very faint pedal pulses on the left both at the dorsalis pedis and posterior tibial. I cannot feel a posterior tibial on the right but there is no open wound here. If these stall we may need to send him for another vascular review Electronic Signature(s) Signed: 11/12/2018 5:12:30 PM By: Linton Ham MD Entered By: Linton Ham on 11/12/2018 09:35:50 [name] [name] ([id number]) -------------------------------------------------------------------------------- [place] Details Patient Name: [name] [name] Date of Service: 11/12/2018 Medical Record Number: [id number] Patient Account Number: [id number] Date of Birth/Sex: [birth year] (85 y.o.  M) Treating RN: Harold Barban Primary Care Provider: Lew Dawes Other Clinician: Referring Provider: Lew Dawes Treating Provider/Extender: Tito Dine in Treatment: 2 Diagnosis Coding ICD-10 Codes Code Description [phone] Pressure ulcer of other site, stage 3 L89.890 Pressure ulcer of other site, unstageable I70.292 Other atherosclerosis of native arteries of extremities, left leg Facility Procedures CPT4 Code: [id number] Description: 97989 - DEB SUBQ TISSUE 20 SQ CM/< ICD-10 Diagnosis Description L89.893 Pressure ulcer of other site, stage 3 Modifier: Quantity: 1 Physician Procedures CPT4 Code: [id number] Description: 40814 - WC PHYS SUBQ TISS 20 SQ CM ICD-10 Diagnosis Description L89.893 Pressure ulcer of other site, stage 3 Modifier: Quantity: 1 Electronic Signature(s) Signed: 11/12/2018 5:12:30 PM By: Linton Ham MD Entered By: Linton Ham on 11/12/2018 09:36:26

## 2018-11-13 NOTE — Progress Notes (Signed)
[name] [name], [name] [name] ([id number]) Visit Report for 11/12/2018 Arrival Information Details Patient Name: [name] [name], [name] [name] Date of Service: 11/12/2018 9:00 AM Medical Record Number: [id number] Patient Account Number: [id number] Date of Birth/Sex: 1932/06/03 (85 y.o. M) Treating RN: [name] [name] Primary Care [name] [name]: [name] [name] Other Clinician: Referring [name] [name]: [name] [name] Treating [name] [name]/Extender: [name] [name] in Treatment: 2 Visit Information History Since Last Visit Added or deleted any medications: No Patient Arrived: Ambulatory Any new allergies or adverse reactions: No Arrival Time: 08:52 Had a fall or experienced change in No Accompanied By: daughter activities of daily living that may affect Transfer Assistance: None risk of falls: Patient Identification Verified: Yes Signs or symptoms of abuse/neglect since last visito No Secondary Verification Process Completed: Yes Hospitalized since last visit: No Implantable device outside of the clinic excluding No cellular tissue based products placed in the center since last visit: Has Dressing in Place as Prescribed: Yes Pain Present Now: Yes Electronic Signature(s) Signed: 11/12/2018 4:24:39 PM By: [name] [name] RCP, RRT, CHT Entered By: [name] [name] on 11/12/2018 08:54:22 [name] [name] ([id number]) -------------------------------------------------------------------------------- Encounter Discharge Information Details Patient Name: [name] [name] Date of Service: 11/12/2018 9:00 AM Medical Record Number: [id number] Patient Account Number: [id number] Date of Birth/Sex: Jul 25, 1932 (85 y.o. M) Treating RN: [name] [name] Primary Care Lynzy Rawles: [name] [name] Other Clinician: Referring [name] [name]: [name] [name] Treating [name] [name]/Extender: [name] [name] in Treatment: 2 Encounter Discharge Information Items Post Procedure Vitals Discharge Condition:  Stable Temperature (F): 97.7 Ambulatory Status: Ambulatory Pulse (bpm): 67 Discharge Destination: Home Respiratory Rate (breaths/min): 16 Transportation: Private Auto Blood Pressure (mmHg): 144/54 Accompanied By: daughter Schedule Follow-up Appointment: Yes Clinical Summary of Care: Electronic Signature(s) Signed: 11/12/2018 4:31:27 PM By: [name] [name] Entered By: [name] [name] on 11/12/2018 09:38:05 [name] [name] ([id number]) -------------------------------------------------------------------------------- Lower Extremity Assessment Details Patient Name: [name] [name] Date of Service: 11/12/2018 9:00 AM Medical Record Number: [id number] Patient Account Number: [id number] Date of Birth/Sex: [birth year] (85 y.o. M) Treating RN: [name] [name] Primary Care Giovannie Scerbo: [name] [name] Other Clinician: Referring Durrell Barajas: [name] [name] Treating Shlonda Dolloff/Extender: [name] [name] Weeks in Treatment: 2 Edema Assessment Assessed: [Left: No] [Right: No] Edema: [Left: No] [Right: No] Vascular Assessment Pulses: Dorsalis Pedis Palpable: [Left:Yes] [Right:Yes] Electronic Signature(s) Signed: 11/12/2018 4:41:53 PM By: [name] [name] Entered By: [name] [name] on 11/12/2018 09:07:28 [name] [name] ([id number]) -------------------------------------------------------------------------------- Multi Wound Chart Details Patient Name: [name] [name] Date of Service: 11/12/2018 9:00 AM Medical Record Number: [id number] Patient Account Number: [id number] Date of Birth/Sex: 1932-11-10 (85 y.o. M) Treating RN: [name] [name] Primary Care Lilliann [name]: [name] [name] Other Clinician: Referring [name] [name]: [name] [name] Treating [name] [name]/Extender: [name] [name] in Treatment: 2 Vital Signs Height(in): 69 Pulse(bpm): 61 Weight(lbs): 150 Blood Pressure(mmHg): 144/54 Body Mass Index(BMI): 22 Temperature(F): 97.7 Respiratory Rate 16 (breaths/min): Photos: Wound  Location: Left Toe Great - Medial Left Toe Second - Medial Right Toe Second Wounding Event: Gradually Appeared Gradually Appeared Gradually Appeared Primary Etiology: Pressure Ulcer Pressure Ulcer Pressure Ulcer Secondary Etiology: MASD o Moisture Associated MASD o Moisture Associated MASD o Moisture Associated Skin Damage Skin Damage Skin Damage Comorbid History: Cataracts, Glaucoma, Cataracts, Glaucoma, N/A Peripheral Arterial Disease, Peripheral Arterial Disease, Gout Gout Date Acquired: 06/29/2018 06/29/2018 06/29/2018 Weeks of Treatment: 2 2 2  Wound Status: Open Open Healed - Epithelialized Measurements L x W x D 0.5x0.5x0.1 0.6x0.6x0.2 0x0x0 (cm) Area (cm) : 0.196 0.283 0 Volume (cm) : 0.02 0.057 0 % Reduction in Area: 83.40% 76.00% 100.00% % Reduction in Volume: 83.10% 87.90%  100.00% Starting Position 1 1 (o'clock): Ending Position 1 1 (o'clock): Maximum Distance 1 (cm): 0.3 Undermining: No Yes N/A Classification: Unstageable/Unclassified Category/Stage III Category/Stage I Exudate Amount: Medium Medium N/A Exudate Type: Serous Serous N/A Exudate Color: amber amber N/A Wound Margin: Flat and Intact Flat and Intact N/A Granulation Amount: Small (1-33%) Medium (34-66%) N/A [name] [name] ([id number]) Granulation Quality: Pink Pink N/A Necrotic Amount: Large (67-100%) Medium (34-66%) N/A Exposed Structures: Fat Layer (Subcutaneous Fat Layer (Subcutaneous N/A Tissue) Exposed: Yes Tissue) Exposed: Yes Fascia: No Fascia: No Tendon: No Tendon: No Muscle: No Muscle: No Joint: No Joint: No Bone: No Bone: No Epithelialization: None None N/A Debridement: N/A Debridement - Excisional N/A Pre-procedure N/A 09:20 N/A Verification/Time Out Taken: Pain Control: N/A Lidocaine N/A Tissue Debrided: N/A Subcutaneous, Slough N/A Level: N/A Skin/Subcutaneous Tissue N/A Debridement Area (sq cm): N/A 0.36 N/A Instrument: N/A Curette N/A Bleeding: N/A Minimum N/A Hemostasis  Achieved: N/A Pressure N/A Procedural Pain: N/A 0 N/A Post Procedural Pain: N/A 0 N/A Debridement Treatment N/A Procedure was tolerated well N/A Response: Post Debridement N/A 0.6x0.6x0.2 N/A Measurements L x W x D (cm) Post Debridement Volume: N/A 0.057 N/A (cm) Post Debridement Stage: N/A Category/Stage III N/A Procedures Performed: N/A Debridement N/A Treatment Notes Electronic Signature(s) Signed: 11/12/2018 5:12:30 PM By: Linton Ham MD Entered By: Linton Ham on 11/12/2018 09:28:06 [name] [name] ([id number]) -------------------------------------------------------------------------------- Multi-Disciplinary Care Plan Details Patient Name: [name] [name] Date of Service: 11/12/2018 9:00 AM Medical Record Number: [id number] Patient Account Number: [id number] Date of Birth/Sex: [birth year] (85 y.o. M) Treating RN: [name] [name] Primary Care Warner Laduca: [name] [name] Other Clinician: Referring Chun Sellen: [name] [name] Treating Haeleigh Streiff/Extender: [name] [name] in Treatment: 2 Active Inactive Abuse / Safety / Falls / Self Care Management Nursing Diagnoses: Potential for falls Goals: Patient will not experience any injury related to falls Date Initiated: 10/29/2018 Target Resolution Date: 11/12/2018 Goal Status: Active Interventions: Provide education on basic hygiene Treatment Activities: Education provided on Basic Hygiene : 10/29/2018 Notes: Orientation to the Wound Care Program Nursing Diagnoses: Knowledge deficit related to the wound healing center program Goals: Patient/caregiver will verbalize understanding of the [facility] Date Initiated: 10/29/2018 Target Resolution Date: 10/29/2018 Goal Status: Active Interventions: Provide education on orientation to the wound center Notes: Pressure Nursing Diagnoses: Potential for impaired tissue integrity related to pressure, friction, moisture, and shear Goals: Patient will  remain free of pressure ulcers Date Initiated: 10/29/2018 Target Resolution Date: 11/12/2018 Goal Status: Active EVERET, FLAGG ([id number]) Interventions: Assess potential for pressure ulcer upon admission and as needed Notes: Soft Tissue Infection Nursing Diagnoses: Potential for infection: soft tissue Goals: Patient will remain free of wound infection Date Initiated: 10/29/2018 Target Resolution Date: 11/12/2018 Goal Status: Active Interventions: Assess signs and symptoms of infection every visit Notes: Wound/Skin Impairment Nursing Diagnoses: Impaired tissue integrity Goals: Ulcer/skin breakdown will have a volume reduction of 30% by week 4 Date Initiated: 10/29/2018 Target Resolution Date: 11/29/2018 Goal Status: Active Interventions: Assess ulceration(s) every visit Treatment Activities: Skin care regimen initiated : 10/29/2018 Topical wound management initiated : 10/29/2018 Notes: Electronic Signature(s) Signed: 11/12/2018 4:53:39 PM By: [name] [name] Entered By: [name] [name] on 11/12/2018 09:15:43 [name] [name] ([id number]) -------------------------------------------------------------------------------- Pain Assessment Details Patient Name: [name] [name] Date of Service: 11/12/2018 9:00 AM Medical Record Number: [id number] Patient Account Number: [id number] Date of Birth/Sex: 1933/04/12 (85 y.o. M) Treating RN: [name] [name] Primary Care Christropher Gintz: [name] [name] Other Clinician: Referring Carreen Milius: [name] [name] Treating Nissan Frazzini/Extender: [name] [name]  in Treatment: 2 Active Problems Location of Pain Severity and Description of Pain Patient Has Paino Yes Site Locations Rate the pain. Current Pain Level: 4 Pain Management and Medication Current Pain Management: Electronic Signature(s) Signed: 11/12/2018 4:24:39 PM By: [name] [name] RCP, RRT, CHT Signed: 11/12/2018 4:53:39 PM By: [name] [name] Entered By: [name] [name] on 11/12/2018 08:54:35 [name] [name] ([id number]) -------------------------------------------------------------------------------- Patient/Caregiver Education Details Patient Name: [name] [name] Date of Service: 11/12/2018 9:00 AM Medical Record Number: [id number] Patient Account Number: [id number] Date of Birth/Gender: [birth year] (85 y.o. M) Treating RN: [name] [name] Primary Care Physician: [name] [name] Other Clinician: Referring Physician: Lew [name] Treating Physician/Extender: [name] [name] in Treatment: 2 Education Assessment Education Provided To: Patient Education Topics Provided Wound/Skin Impairment: Handouts: Caring for Your Ulcer Methods: Demonstration, Explain/Verbal Responses: State content correctly Electronic Signature(s) Signed: 11/12/2018 4:53:39 PM By: [name] [name] Entered By: [name] [name] on 11/12/2018 09:16:08 [name] [name] ([id number]) -------------------------------------------------------------------------------- Wound Assessment Details Patient Name: [name] [name] Date of Service: 11/12/2018 9:00 AM Medical Record Number: [id number] Patient Account Number: [id number] Date of Birth/Sex: 21-Oct-1932 (85 y.o. M) Treating RN: [name] [name] Primary Care Rheannon Cerney: [name] [name] Other Clinician: Referring Trong Gosling: [name] [name] Treating Jaquaya Coyle/Extender: [name] [name] in Treatment: 2 Wound Status Wound Number: 1 Primary Etiology: Pressure Ulcer Wound Location: Left Toe Great - Medial Secondary MASD o Moisture Associated Skin Damage Etiology: Wounding Event: Gradually Appeared Wound Status: Open Date Acquired: 06/29/2018 Comorbid Cataracts, Glaucoma, Peripheral Arterial Weeks Of Treatment: 2 History: Disease, Gout Clustered Wound: No Photos Wound Measurements Length: (cm) 0.5 % Reduction Width: (cm) 0.5 % Reduction Depth: (cm) 0.1 Epithelializ Area: (cm)  0.196 Tunneling: Volume: (cm) 0.02 Undermining in Area: 83.4% in Volume: 83.1% ation: None No : No Wound Description Classification: Unstageable/Unclassified Foul Odor A Wound Margin: Flat and Intact Exudate Amount: Medium Exudate Type: Serous Exudate Color: amber fter Cleansing: No Wound Bed Granulation Amount: Small (1-33%) Exposed Structure Granulation Quality: Pink Fascia Exposed: No Necrotic Amount: Large (67-100%) Fat Layer (Subcutaneous Tissue) Exposed: Yes Necrotic Quality: Adherent Slough Tendon Exposed: No Muscle Exposed: No Joint Exposed: No Bone Exposed: No Treatment Notes EDEL, RIVERO ([id number]) Wound #1 (Left, Medial Toe Great) Notes Silvercell, lambs wool between toes, conform to secure, wider shoes needed Electronic Signature(s) Signed: 11/12/2018 4:41:53 PM By: [name] [name] Entered By: [name] [name] on 11/12/2018 09:06:28 [name] [name] ([id number]) -------------------------------------------------------------------------------- Wound Assessment Details Patient Name: [name] [name] Date of Service: 11/12/2018 9:00 AM Medical Record Number: [id number] Patient Account Number: [id number] Date of Birth/Sex: 1933-01-07 (85 y.o. M) Treating RN: [name] [name] Primary Care Cameren Odwyer: [name] [name] Other Clinician: Referring Eiman Maret: [name] [name] Treating Lindell Renfrew/Extender: [name] [name] in Treatment: 2 Wound Status Wound Number: 2 Primary Etiology: Pressure Ulcer Wound Location: Left Toe Second - Medial Secondary MASD o Moisture Associated Skin Damage Etiology: Wounding Event: Gradually Appeared Wound Status: Open Date Acquired: 06/29/2018 Comorbid Cataracts, Glaucoma, Peripheral Arterial Weeks Of Treatment: 2 History: Disease, Gout Clustered Wound: No Photos Wound Measurements Length: (cm) 0.6 % Reduction in Width: (cm) 0.6 % Reduction in Depth: (cm) 0.2 Epithelializat Area: (cm) 0.283 Tunneling: Volume: (cm)  0.057 Undermining: Starting Po Ending Posi Maximum Dis Area: 76% Volume: 87.9% ion: None No Yes sition (o'clock): 1 tion (o'clock): 1 tance: (cm) 0.3 Wound Description Classification: Category/Stage III Foul Odor Aft Wound Margin: Flat and Intact Slough/Fibrin Exudate Amount: Medium Exudate Type: Serous Exudate Color: amber er Cleansing: No o Yes Wound Bed Granulation Amount: Medium (34-66%) Exposed  Structure Granulation Quality: Pink Fascia Exposed: No Necrotic Amount: Medium (34-66%) Fat Layer (Subcutaneous Tissue) Exposed: Yes Necrotic Quality: Adherent Slough Tendon Exposed: No Muscle Exposed: No Joint Exposed: No KALVEN, GANIM ([id number]) Bone Exposed: No Treatment Notes Wound #2 (Left, Medial Toe Second) Notes prisma, Silvercell, lambs wool between toes, conform to secure, wider shoes needed Electronic Signature(s) Signed: 11/12/2018 4:41:53 PM By: [name] [name] Entered By: [name] [name] on 11/12/2018 09:07:03 [name] [name] ([id number]) -------------------------------------------------------------------------------- Wound Assessment Details Patient Name: [name] [name] Date of Service: 11/12/2018 9:00 AM Medical Record Number: [id number] Patient Account Number: [id number] Date of Birth/Sex: Dec 20, 1932 (85 y.o. M) Treating RN: [name] [name] Primary Care Lindsay Soulliere: [name] [name] Other Clinician: Referring Ed Mandich: [name] [name] Treating Sharnae Winfree/Extender: [name] [name] in Treatment: 2 Wound Status Wound Number: 4 Primary Etiology: Pressure Ulcer Wound Location: Right Toe Second Secondary MASD o Moisture Associated Skin Etiology: Damage Wounding Event: Gradually Appeared Wound Status: Healed - Epithelialized Date Acquired: 06/29/2018 Weeks Of Treatment: 2 Clustered Wound: No Photos Photo Uploaded By: [name] [name] on 11/12/2018 09:07:50 Wound Measurements Length: (cm) 0 Width: (cm) 0 Depth: (cm) 0 Area: (cm) 0 Volume:  (cm) 0 % Reduction in Area: 100% % Reduction in Volume: 100% Wound Description Classification: Category/Stage I Electronic Signature(s) Signed: 11/12/2018 4:41:53 PM By: [name] [name] Entered By: [name] [name] on 11/12/2018 09:04:13 [name] [name] ([id number]) -------------------------------------------------------------------------------- [place] Details Patient Name: [name] [name] Date of Service: 11/12/2018 9:00 AM Medical Record Number: [id number] Patient Account Number: [id number] Date of Birth/Sex: [birth year] (85 y.o. M) Treating RN: [name] [name] Primary Care Lorenzo Arscott: [name] [name] Other Clinician: Referring Natasja Niday: [name] [name] Treating Johnthan Axtman/Extender: [name] [name] in Treatment: 2 Vital Signs Time Taken: 08:50 Temperature (F): 97.7 Height (in): 69 Pulse (bpm): 61 Weight (lbs): 150 Respiratory Rate (breaths/min): 16 Body Mass Index (BMI): 22.1 Blood Pressure (mmHg): 144/54 Reference Range: 80 - 120 mg / dl Electronic Signature(s) Signed: 11/12/2018 4:24:39 PM By: [name] [name] RCP, RRT, CHT Entered By: [name] [name] on 11/12/2018 09:00:03

## 2018-11-19 ENCOUNTER — Other Ambulatory Visit: Payer: Self-pay

## 2018-11-19 ENCOUNTER — Encounter: Payer: Medicare Other | Attending: Internal Medicine | Admitting: Internal Medicine

## 2018-11-19 DIAGNOSIS — H409 Unspecified glaucoma: Secondary | ICD-10-CM | POA: Diagnosis not present

## 2018-11-19 DIAGNOSIS — I872 Venous insufficiency (chronic) (peripheral): Secondary | ICD-10-CM | POA: Insufficient documentation

## 2018-11-19 DIAGNOSIS — I10 Essential (primary) hypertension: Secondary | ICD-10-CM | POA: Insufficient documentation

## 2018-11-19 DIAGNOSIS — Z884 Allergy status to anesthetic agent status: Secondary | ICD-10-CM | POA: Diagnosis not present

## 2018-11-19 DIAGNOSIS — M109 Gout, unspecified: Secondary | ICD-10-CM | POA: Insufficient documentation

## 2018-11-19 DIAGNOSIS — L89893 Pressure ulcer of other site, stage 3: Secondary | ICD-10-CM | POA: Diagnosis not present

## 2018-11-19 DIAGNOSIS — E785 Hyperlipidemia, unspecified: Secondary | ICD-10-CM | POA: Insufficient documentation

## 2018-11-19 DIAGNOSIS — Z888 Allergy status to other drugs, medicaments and biological substances status: Secondary | ICD-10-CM | POA: Diagnosis not present

## 2018-11-19 DIAGNOSIS — Z881 Allergy status to other antibiotic agents status: Secondary | ICD-10-CM | POA: Insufficient documentation

## 2018-11-19 DIAGNOSIS — I70202 Unspecified atherosclerosis of native arteries of extremities, left leg: Secondary | ICD-10-CM | POA: Diagnosis not present

## 2018-11-19 DIAGNOSIS — I2510 Atherosclerotic heart disease of native coronary artery without angina pectoris: Secondary | ICD-10-CM | POA: Diagnosis not present

## 2018-11-19 DIAGNOSIS — Z951 Presence of aortocoronary bypass graft: Secondary | ICD-10-CM | POA: Insufficient documentation

## 2018-11-19 DIAGNOSIS — F0390 Unspecified dementia without behavioral disturbance: Secondary | ICD-10-CM | POA: Insufficient documentation

## 2018-11-21 NOTE — Progress Notes (Addendum)
[name] [name], [name] [name] ([id number]) Visit Report for 11/19/2018 Arrival Information Details Patient Name: [name] [name], [name] [name] Date of Service: 11/19/2018 10:30 AM Medical Record Number: [id number] Patient Account Number: [id number] Date of Birth/Sex: 1933/03/26 (85 y.o. M) Treating RN: Cornell Barman Primary Care Travell Desaulniers: Lew Dawes Other Clinician: Referring Cyndal Kasson: Lew Dawes Treating Mathews Stuhr/Extender: Beverly Gust in Treatment: 3 Visit Information History Since Last Visit Added or deleted any medications: No Patient Arrived: Ambulatory Any new allergies or adverse reactions: No Arrival Time: 10:46 Had a fall or experienced change in No Accompanied By: daughter activities of daily living that may affect Transfer Assistance: None risk of falls: Patient Identification Verified: Yes Signs or symptoms of abuse/neglect since last visito No Secondary Verification Process Completed: Yes Hospitalized since last visit: No Implantable device outside of the clinic excluding No cellular tissue based products placed in the center since last visit: Has Dressing in Place as Prescribed: No Pain Present Now: No Electronic Signature(s) Signed: 11/19/2018 4:33:25 PM By: Lorine Bears RCP, RRT, CHT Entered By: Lorine Bears on 11/19/2018 10:47:54 [name] [name] ([id number]) -------------------------------------------------------------------------------- Encounter Discharge Information Details Patient Name: [name] [name] Date of Service: 11/19/2018 10:30 AM Medical Record Number: [id number] Patient Account Number: [id number] Date of Birth/Sex: 04/12/1933 (85 y.o. M) Treating RN: Cornell Barman Primary Care Aeon Kessner: Lew Dawes Other Clinician: Referring Antoine Fiallos: Lew Dawes Treating Mandie Crabbe/Extender: Beverly Gust in Treatment: 3 Encounter Discharge Information Items Post Procedure Vitals Discharge Condition: Stable Unable to obtain  vitals Reason: . Ambulatory Status: Ambulatory Discharge Destination: Home Transportation: Private Auto Accompanied By: self Schedule Follow-up Appointment: Yes Clinical Summary of Care: Electronic Signature(s) Signed: 11/21/2018 5:41:45 PM By: Gretta Cool, BSN, RN, CWS, Kim RN, BSN Entered By: Gretta Cool, BSN, RN, CWS, Kim on 11/19/2018 11:23:40 [name] [name] ([id number]) -------------------------------------------------------------------------------- Lower Extremity Assessment Details Patient Name: [name] [name] Date of Service: 11/19/2018 10:30 AM Medical Record Number: [id number] Patient Account Number: [id number] Date of Birth/Sex: Nov 23, 1932 (85 y.o. M) Treating RN: Montey Hora Primary Care Ambar Raphael: Lew Dawes Other Clinician: Referring Geanie Pacifico: Lew Dawes Treating Airen Stiehl/Extender: Beverly Gust in Treatment: 3 Edema Assessment Assessed: [Left: No] [Right: No] Edema: [Left: N] [Right: o] Vascular Assessment Pulses: Dorsalis Pedis Palpable: [Left:Yes] Electronic Signature(s) Signed: 11/20/2018 4:34:48 PM By: Montey Hora Entered By: Montey Hora on 11/19/2018 11:01:50 [name] [name] ([id number]) -------------------------------------------------------------------------------- Multi Wound Chart Details Patient Name: [name] [name] Date of Service: 11/19/2018 10:30 AM Medical Record Number: [id number] Patient Account Number: [id number] Date of Birth/Sex: 02/22/1933 (85 y.o. M) Treating RN: Cornell Barman Primary Care Redding Cloe: Lew Dawes Other Clinician: Referring Teriyah Purington: Lew Dawes Treating Wally Shevchenko/Extender: Beverly Gust in Treatment: 3 Vital Signs Height(in): 69 Pulse(bpm): 48 Weight(lbs): 150 Blood Pressure(mmHg): 156/53 Body Mass Index(BMI): 22 Temperature(F): 99.2 Respiratory Rate 16 (breaths/min): Photos: Wound Location: Left Toe Great - Medial Left Toe Second - Medial Left Toe Third - Medial Wounding Event:  Gradually Appeared Gradually Appeared Gradually Appeared Primary Etiology: Pressure Ulcer Pressure Ulcer Pressure Ulcer Secondary Etiology: MASD o Moisture Associated MASD o Moisture Associated N/A Skin Damage Skin Damage Comorbid History: Cataracts, Glaucoma, Cataracts, Glaucoma, Cataracts, Glaucoma, Peripheral Arterial Disease, Peripheral Arterial Disease, Peripheral Arterial Disease, Gout Gout Gout Date Acquired: 06/29/2018 06/29/2018 11/19/2018 Weeks of Treatment: 3 3 0 Wound Status: Open Open Open Measurements L x W x D 0.5x0.4x0.1 0.5x0.6x0.2 0.2x0.1x0.1 (cm) Area (cm) : 0.157 0.236 0.016 Volume (cm) : 0.016 0.047 0.002 % Reduction in Area: 86.70% 80.00% N/A % Reduction in Volume: 86.40% 90.00% N/A Classification: Category/Stage III Category/Stage  III Category/Stage III Exudate Amount: Medium Medium Medium Exudate Type: Purulent Purulent Purulent Exudate Color: yellow, brown, green yellow, brown, green yellow, brown, green Wound Margin: Flat and Intact Flat and Intact Flat and Intact Granulation Amount: Small (1-33%) Small (1-33%) Large (67-100%) Granulation Quality: Pink Pink Pink Necrotic Amount: Large (67-100%) Large (67-100%) None Present (0%) Exposed Structures: Fat Layer (Subcutaneous Fat Layer (Subcutaneous Fat Layer (Subcutaneous Tissue) Exposed: Yes Tissue) Exposed: Yes Tissue) Exposed: Yes Fascia: No Fascia: No Fascia: No Tendon: No Tendon: No Tendon: No [name] [name], [name] [name]. ([id number]) Muscle: No Muscle: No Muscle: No Joint: No Joint: No Joint: No Bone: No Bone: No Bone: No Epithelialization: None None None Treatment Notes Electronic Signature(s) Signed: 11/21/2018 5:41:45 PM By: Gretta Cool, BSN, RN, CWS, Kim RN, BSN Entered By: Gretta Cool, BSN, RN, CWS, Kim on 11/19/2018 11:17:33 [name] [name] ([id number]) -------------------------------------------------------------------------------- [place] Details Patient Name: [name] [name] Date of  Service: 11/19/2018 10:30 AM Medical Record Number: [id number] Patient Account Number: [id number] Date of Birth/Sex: 05/28/1932 (85 y.o. M) Treating RN: Cornell Barman Primary Care Corbin Hott: Lew Dawes Other Clinician: Referring Nuria Phebus: Lew Dawes Treating Shaden Lacher/Extender: Beverly Gust in Treatment: 3 Active Inactive Electronic Signature(s) Signed: 01/06/2019 3:11:17 PM By: Gretta Cool, BSN, RN, CWS, Kim RN, BSN Previous Signature: 11/21/2018 5:41:45 PM Version By: Gretta Cool, BSN, RN, CWS, Kim RN, BSN Entered By: Gretta Cool, BSN, RN, CWS, Kim on 01/06/2019 15:11:16 [name] [name] ([id number]) -------------------------------------------------------------------------------- Pain Assessment Details Patient Name: [name] [name] Date of Service: 11/19/2018 10:30 AM Medical Record Number: [id number] Patient Account Number: [id number] Date of Birth/Sex: 04-26-1932 (85 y.o. M) Treating RN: Cornell Barman Primary Care Lequita Meadowcroft: Lew Dawes Other Clinician: Referring Menucha Dicesare: Lew Dawes Treating Terrace Fontanilla/Extender: Beverly Gust in Treatment: 3 Active Problems Location of Pain Severity and Description of Pain Patient Has Paino No Site Locations Pain Management and Medication Current Pain Management: Electronic Signature(s) Signed: 11/19/2018 4:33:25 PM By: Lorine Bears RCP, RRT, CHT Signed: 11/21/2018 5:41:45 PM By: Gretta Cool, BSN, RN, CWS, Kim RN, BSN Entered By: Lorine Bears on 11/19/2018 10:48:36 [name] [name] ([id number]) -------------------------------------------------------------------------------- Patient/Caregiver Education Details Patient Name: [name] [name] Date of Service: 11/19/2018 10:30 AM Medical Record Number: [id number] Patient Account Number: [id number] Date of Birth/Gender: Sep 15, 1932 (85 y.o. M) Treating RN: Cornell Barman Primary Care Physician: Lew Dawes Other Clinician: Referring Physician: Lew Dawes Treating Physician/Extender: Beverly Gust in Treatment: 3 Education Assessment Education Provided To: Patient Education Topics Provided Wound/Skin Impairment: Handouts: Caring for Your Ulcer Methods: Demonstration Responses: State content correctly Electronic Signature(s) Signed: 11/21/2018 5:41:45 PM By: Gretta Cool, BSN, RN, CWS, Kim RN, BSN Entered By: Gretta Cool, BSN, RN, CWS, Kim on 11/19/2018 11:21:55 [name] [name] ([id number]) -------------------------------------------------------------------------------- Wound Assessment Details Patient Name: [name] [name] Date of Service: 11/19/2018 10:30 AM Medical Record Number: [id number] Patient Account Number: [id number] Date of Birth/Sex: 15-Jun-1932 (85 y.o. M) Treating RN: Montey Hora Primary Care Blaike Newburn: Lew Dawes Other Clinician: Referring Blakeley Scheier: Lew Dawes Treating Jolicia Delira/Extender: Beverly Gust in Treatment: 3 Wound Status Wound Number: 1 Primary Etiology: Pressure Ulcer Wound Location: Left Toe Great - Medial Secondary MASD o Moisture Associated Skin Damage Etiology: Wounding Event: Gradually Appeared Wound Status: Open Date Acquired: 06/29/2018 Comorbid Cataracts, Glaucoma, Peripheral Arterial Weeks Of Treatment: 3 History: Disease, Gout Clustered Wound: No Photos Wound Measurements Length: (cm) 0.5 % Reduction in Width: (cm) 0.4 % Reduction in Depth: (cm) 0.1 Epithelializat Area: (cm) 0.157 Tunneling: Volume: (cm) 0.016 Undermining: Area: 86.7% Volume: 86.4% ion: None No No Wound Description  Classification: Category/Stage III Foul Odor Afte Wound Margin: Flat and Intact Exudate Amount: Medium Exudate Type: Purulent Exudate Color: yellow, brown, green r Cleansing: No Wound Bed Granulation Amount: Small (1-33%) Exposed Structure Granulation Quality: Pink Fascia Exposed: No Necrotic Amount: Large (67-100%) Fat Layer (Subcutaneous Tissue) Exposed: Yes Necrotic  Quality: Adherent Slough Tendon Exposed: No Muscle Exposed: No Joint Exposed: No Bone Exposed: No Electronic Signature(s) [name] [name], [name] [name] ([id number]) Signed: 11/20/2018 4:34:48 PM By: Montey Hora Entered By: Montey Hora on 11/19/2018 11:00:21 [name] [name] ([id number]) -------------------------------------------------------------------------------- Wound Assessment Details Patient Name: [name] [name] Date of Service: 11/19/2018 10:30 AM Medical Record Number: [id number] Patient Account Number: [id number] Date of Birth/Sex: Jun 14, 1932 (85 y.o. M) Treating RN: Montey Hora Primary Care Paulyne Mooty: Lew Dawes Other Clinician: Referring Kharson Rasmusson: Lew Dawes Treating Jaiyden Laur/Extender: Beverly Gust in Treatment: 3 Wound Status Wound Number: 2 Primary Etiology: Pressure Ulcer Wound Location: Left Toe Second - Medial Secondary MASD o Moisture Associated Skin Damage Etiology: Wounding Event: Gradually Appeared Wound Status: Open Date Acquired: 06/29/2018 Comorbid Cataracts, Glaucoma, Peripheral Arterial Weeks Of Treatment: 3 History: Disease, Gout Clustered Wound: No Photos Wound Measurements Length: (cm) 0.5 % Reduction in Width: (cm) 0.6 % Reduction in Depth: (cm) 0.2 Epithelializat Area: (cm) 0.236 Tunneling: Volume: (cm) 0.047 Undermining: Area: 80% Volume: 90% ion: None No No Wound Description Classification: Category/Stage III Foul Odor Afte Wound Margin: Flat and Intact Slough/Fibrino Exudate Amount: Medium Exudate Type: Purulent Exudate Color: yellow, brown, green r Cleansing: No Yes Wound Bed Granulation Amount: Small (1-33%) Exposed Structure Granulation Quality: Pink Fascia Exposed: No Necrotic Amount: Large (67-100%) Fat Layer (Subcutaneous Tissue) Exposed: Yes Necrotic Quality: Adherent Slough Tendon Exposed: No Muscle Exposed: No Joint Exposed: No Bone Exposed: No Electronic Signature(s) [name] [name], [name] [name]  ([id number]) Signed: 11/20/2018 4:34:48 PM By: Montey Hora Entered By: Montey Hora on 11/19/2018 11:00:39 [name] [name] ([id number]) -------------------------------------------------------------------------------- Wound Assessment Details Patient Name: [name] [name] Date of Service: 11/19/2018 10:30 AM Medical Record Number: [id number] Patient Account Number: [id number] Date of Birth/Sex: [birth year] (85 y.o. M) Treating RN: Montey Hora Primary Care Alois Mincer: Lew Dawes Other Clinician: Referring Reather Steller: Lew Dawes Treating Kreg Earhart/Extender: Beverly Gust in Treatment: 3 Wound Status Wound Number: 5 Primary Pressure Ulcer Etiology: Wound Location: Left Toe Third - Medial Wound Status: Open Wounding Event: Gradually Appeared Comorbid Cataracts, Glaucoma, Peripheral Arterial Date Acquired: 11/19/2018 History: Disease, Gout Weeks Of Treatment: 0 Clustered Wound: No Photos Wound Measurements Length: (cm) 0.2 % Reduction i Width: (cm) 0.1 % Reduction i Depth: (cm) 0.1 Epithelializa Area: (cm) 0.016 Tunneling: Volume: (cm) 0.002 Undermining: n Area: n Volume: tion: None No No Wound Description Classification: Category/Stage III Wound Margin: Flat and Intact Exudate Amount: Medium Exudate Type: Purulent Exudate Color: yellow, brown, green Wound Bed Granulation Amount: Large (67-100%) Exposed Structure Granulation Quality: Pink Fascia Exposed: No Necrotic Amount: None Present (0%) Fat Layer (Subcutaneous Tissue) Exposed: Yes Tendon Exposed: No Muscle Exposed: No Joint Exposed: No Bone Exposed: No Electronic Signature(s) [name] [name], [name] [name] ([id number]) Signed: 11/20/2018 4:34:48 PM By: Montey Hora Entered By: Montey Hora on 11/19/2018 10:59:53 [name] [name] ([id number]) -------------------------------------------------------------------------------- [place] Details Patient Name: [name] [name] Date of Service: 11/19/2018 10:30  AM Medical Record Number: [id number] Patient Account Number: [id number] Date of Birth/Sex: 17-Apr-1932 (85 y.o. M) Treating RN: Cornell Barman Primary Care Johnae Friley: Lew Dawes Other Clinician: Referring Runell Kovich: Lew Dawes Treating Jaysin Gayler/Extender: Beverly Gust in Treatment: 3 Vital Signs Time Taken: 10:48 Temperature (F): 99.2 Height (in): 69 Pulse (bpm): 68 Weight (  lbs): 150 Respiratory Rate (breaths/min): 16 Body Mass Index (BMI): 22.1 Blood Pressure (mmHg): 156/53 Reference Range: 80 - 120 mg / dl Electronic Signature(s) Signed: 11/19/2018 4:33:25 PM By: Lorine Bears RCP, RRT, CHT Entered By: Lorine Bears on 11/19/2018 10:51:44

## 2018-11-21 NOTE — Progress Notes (Signed)
[name] [name], [name] [name] ([id number]) Visit Report for 11/19/2018 Debridement Details Patient Name: [name] [name], [name] [name] Date of Service: 11/19/2018 10:30 AM Medical Record Number: [id number] Patient Account Number: [id number] Date of Birth/Sex: 02/04/1933 (85 y.o. M) Treating RN: [name] [name] Primary Care Provider: Lew [name] Other Clinician: Referring Provider: Lew [name] Treating Provider/Extender: [name] [name] in Treatment: 3 Debridement Performed for Wound #1 Left,Medial Toe Great Assessment: Performed By: Physician [name] Bastos, MD Debridement Type: Debridement Level of Consciousness (Pre- Awake and Alert procedure): Pre-procedure Verification/Time Yes - 11:18 Out Taken: Start Time: 11:18 Pain Control: Lidocaine Total Area Debrided (L x W): 0.5 (cm) x 0.4 (cm) = 0.2 (cm) Tissue and other material Viable, Slough, Subcutaneous, Slough debrided: Level: Skin/Subcutaneous Tissue Debridement Description: Excisional Instrument: Curette Bleeding: Minimum Hemostasis Achieved: Pressure End Time: 11:20 Response to Treatment: Procedure was tolerated well Level of Consciousness Awake and Alert (Post-procedure): Post Debridement Measurements of Total Wound Length: (cm) 0.5 Stage: Category/Stage III Width: (cm) 0.4 Depth: (cm) 0.2 Volume: (cm) 0.031 Character of Wound/Ulcer Post Stable Debridement: Post Procedure Diagnosis Same as Pre-procedure Electronic Signature(s) Signed: 11/19/2018 4:03:26 PM By: [name] [name] Signed: 11/21/2018 5:41:45 PM By: [name] [name], BSN, RN, CWS, Kim RN, BSN Entered By: [name] [name], BSN, RN, CWS, [name] [name] on 11/19/2018 11:21:11 [name] [name] ([id number]) -------------------------------------------------------------------------------- HPI Details Patient Name: [name] [name] Date of Service: 11/19/2018 10:30 AM Medical Record Number: [id number] Patient Account Number: [id number] Date of Birth/Sex: 12-25-1932 (85 y.o. M) Treating RN: [name] [name] Primary Care Provider: Lew [name] Other Clinician: Referring Provider: Lew [name] Treating Provider/Extender: [name] [name] in Treatment: 3 History of Present Illness HPI Description: [name] [name] 12/2017 This is an 85 year old man who is accompanied by his daughter today. They're largely here for concerns for discoloration in his bilateral lower legs. This is apparently been getting worse over the last year and is more extensive on the right lower leg. He also developed swelling of his legs but no real pain. He had DVT rule out studies in May of this year that were negative bilaterally. The patient has not had reflux studies. He does wear compression stockings although we are not exactly sure what strength compression. Recently his primary care doctor told the patient not to wear these for bit of time and so they are wearing them currently. Looking through [facility] link the patient had arterial studies done in 2015. This showed aortoiliac disease and a greater than 650% right SFA stenosis. He had 3 vessel runoff. He was reviewed by Dr. Fletcher Anon in March 2016 at that point he quoted a right ABI of 0.77 and 0.86 on the left. There was evidence of inflow disease based on waveforms. Duplex showed significant bilateral SFA disease. As he was reasonably asymptomatic it was recommended he have medical therapy. That was based on the absence of claudication. Currently the patient states he can walk around the block and walks around with his daughter in department stores etc. He does not describe claudication does not seem to be limited by any way I can determine. The patient is not a diabetic. He has a remote coronary artery bypass that used to vein out of the right leg. He is on Lasix. ABIs in our clinic were 0.51 on the right and 0.71 on the left ADMISSION 10/29/2018 This is a patient I saw 1 time at [facility] health wound care center in August 2019. At that point he did not have  any open wounds. He does have known PAD and chronic venous insufficiency. He is  not a diabetic and currently does not describe claudication-like symptoms. He has not followed up with his arterial disease as far as I can tell. Over the last several months they have noticed wound on the medial aspect of the left second toe and more recently on the lateral aspect of the first toe in the webspace. They had a compound medication that included lidocaine, diclofenac cream and another part of this prescribed although they think that this made this worse. This was apparently for this prescribed by podiatry in [place] although I have no information on this. Importantly the patient does not describe pain or pruritus in his feet. Before the wounds were noticed he was walking 2 blocks around his home with his family without any claudication limiting symptoms. Past medical history includes moderate dementia, sciatica, onychomycosis, chronic venous insufficiency, glaucoma, coronary artery disease, hypertension, hyperlipidemia. Remote CABG. He also has PAD [please see my notes from [facility] 8/19 included above] ABIs in our clinic were 0.72 on the right 0.86 on the left. These are actually stable to improved to what we recorded last year 7/22; the patient's wound on the lateral part of the left first toe has opened [was blistered last week]. In addition he has the punched-out area in the first second toe webspace. There was apparently concern on the right first toe last week but this is not open this week. There is no concerning area in this location. We use silver alginate with gauze spreading the toes. X-ray was negative 7/29; both open wounds are in the left first and second toe webspace. The area on the first toe looks better. He still has a deep punched out area over the PIP of the left second toe. There is nothing open on the right. We have been using silver [name] [name], [name] [name]  ([id number]) alginate His daughter relates that his ambulatory status is somewhat worse. It is difficult to get him to describe claudication although he is describing some discomfort in the right leg with walking and on the left second toe. He does have known PAD 8/5-Patient returns at 1 week with first and second webspace wounds, daughter points the second webspace also is having third toe medial wound that is new. We are using silver alginate to the wounds. According to the daughter the first place wound appears worse on the great toe Electronic Signature(s) Signed: 11/19/2018 11:25:56 AM By: [name] [name] Entered By: [name] [name] on 11/19/2018 11:25:55 [name] [name] ([id number]) -------------------------------------------------------------------------------- Physical Exam Details Patient Name: [name] [name] Date of Service: 11/19/2018 10:30 AM Medical Record Number: [id number] Patient Account Number: [id number] Date of Birth/Sex: 04-08-1933 (85 y.o. M) Treating RN: [name] [name] Primary Care Provider: Lew [name] Other Clinician: Referring Provider: Lew [name] Treating Provider/Extender: [name] [name] in Treatment: 3 Constitutional alert and oriented x 3. sitting or standing blood pressure is within target range for patient.. supine blood pressure is within target range for patient.. pulse regular and within target range for patient.Marland Kitchen respirations regular, non-labored and within target range for patient.Marland Kitchen temperature within target range for patient.. . . Well-nourished and well-hydrated in no acute distress. Notes The first and second webspace wounds are about the same except the great toe wound on the lateral side looks worse with some periwound maceration. I debrided this area with #3 curette with very little bleeding. Electronic Signature(s) Signed: 11/19/2018 11:26:59 AM By: [name] [name] Entered By: [name] [name] on 11/19/2018 11:26:58 [name] [name]  ([id number]) -------------------------------------------------------------------------------- Physician Orders Details Patient Name:  [name] [name] Date of Service: 11/19/2018 10:30 AM Medical Record Number: [id number] Patient Account Number: [id number] Date of Birth/Sex: [birth year] (85 y.o. M) Treating RN: [name] [name] Primary Care Provider: Lew [name] Other Clinician: Referring Provider: Lew [name] Treating Provider/Extender: [name] [name] in Treatment: 3 Verbal / Phone Orders: No Diagnosis Coding Wound Cleansing Wound #1 Left,Medial Toe Great o Dial antibacterial soap, wash wounds, rinse and pat dry prior to dressing wounds - clean between toes daily Wound #2 Left,Medial Toe Second o Dial antibacterial soap, wash wounds, rinse and pat dry prior to dressing wounds - clean between toes daily Anesthetic (add to Medication List) Wound #1 Left,Medial Toe Great o Topical Lidocaine 4% cream applied to wound bed prior to debridement (In Clinic Only). Wound #2 Left,Medial Toe Second o Topical Lidocaine 4% cream applied to wound bed prior to debridement (In Clinic Only). Primary Wound Dressing Wound #1 Left,Medial Toe Great o Silver Alginate Wound #2 Left,Medial Toe Second o Silver Alginate Secondary Dressing Wound #1 Left,Medial Toe Great o Gauze and Kerlix/Conform - Lambs wool between toes Wound #2 Left,Medial Toe Second o Gauze and Kerlix/Conform - Lambs wool between toes Dressing Change Frequency Wound #1 Left,Medial Toe Great o Change dressing every day. Wound #2 Left,Medial Toe Second o Change dressing every day. Wound #5 Left,Medial Toe Third o Change dressing every day. Follow-up Appointments Wound #1 Left,Medial Toe Great o Return Appointment in 1 week. Wound #2 Left,Medial Toe Second [name] [name], [name] [name] ([id number]) o Return Appointment in 1 week. Wound #5 Left,Medial Toe Third o Return Appointment in 1 week. Additional  Orders / Instructions Wound #1 Left,Medial Toe Great o Activity as tolerated Wound #2 Left,Medial Toe Second o Activity as tolerated Wound #5 Left,Medial Toe Third o Activity as tolerated Medications-please add to medication list. Wound #1 Left,Medial Toe Great o Other: - Nystatin Powder Wound #2 Left,Medial Toe Second o Other: - Nystatin Powder Wound #5 Left,Medial Toe Third o Other: - Nystatin Powder Patient Medications Allergies: No Known Drug Allergies Notifications Medication Indication Start End nystatin (bulk) 11/19/2018 DOSE miscellaneous 500 million unit powder - powder miscellaneous use between toes at dressing change for 1 week Electronic Signature(s) Signed: 11/19/2018 1:26:26 PM By: [name] [name] Entered By: [name] [name] on 11/19/2018 13:26:25 [name] [name] ([id number]) -------------------------------------------------------------------------------- Progress Note Details Patient Name: [name] [name] Date of Service: 11/19/2018 10:30 AM Medical Record Number: [id number] Patient Account Number: [id number] Date of Birth/Sex: 03-Oct-1932 (85 y.o. M) Treating RN: [name] [name] Primary Care Provider: Lew [name] Other Clinician: Referring Provider: Lew [name] Treating Provider/Extender: [name] [name] in Treatment: 3 Subjective History of Present Illness (HPI) [name] Joe 12/2017 This is an 85 year old man who is accompanied by his daughter today. They're largely here for concerns for discoloration in his bilateral lower legs. This is apparently been getting worse over the last year and is more extensive on the right lower leg. He also developed swelling of his legs but no real pain. He had DVT rule out studies in May of this year that were negative bilaterally. The patient has not had reflux studies. He does wear compression stockings although we are not exactly sure what strength compression. Recently his primary care doctor told the  patient not to wear these for bit of time and so they are wearing them currently. Looking through [facility] link the patient had arterial studies done in 2015. This showed aortoiliac disease and a greater than 650% right SFA stenosis. He had 3 vessel runoff. He was reviewed by Dr.  Arida in March 2016 at that point he quoted a right ABI of 0.77 and 0.86 on the left. There was evidence of inflow disease based on waveforms. Duplex showed significant bilateral SFA disease. As he was reasonably asymptomatic it was recommended he have medical therapy. That was based on the absence of claudication. Currently the patient states he can walk around the block and walks around with his daughter in department stores etc. He does not describe claudication does not seem to be limited by any way I can determine. The patient is not a diabetic. He has a remote coronary artery bypass that used to vein out of the right leg. He is on Lasix. ABIs in our clinic were 0.51 on the right and 0.71 on the left ADMISSION 10/29/2018 This is a patient I saw 1 time at [facility] health wound care center in August 2019. At that point he did not have any open wounds. He does have known PAD and chronic venous insufficiency. He is not a diabetic and currently does not describe claudication-like symptoms. He has not followed up with his arterial disease as far as I can tell. Over the last several months they have noticed wound on the medial aspect of the left second toe and more recently on the lateral aspect of the first toe in the webspace. They had a compound medication that included lidocaine, diclofenac cream and another part of this prescribed although they think that this made this worse. This was apparently for this prescribed by podiatry in [place] although I have no information on this. Importantly the patient does not describe pain or pruritus in his feet. Before the wounds were noticed he was walking 2 blocks around his home  with his family without any claudication limiting symptoms. Past medical history includes moderate dementia, sciatica, onychomycosis, chronic venous insufficiency, glaucoma, coronary artery disease, hypertension, hyperlipidemia. Remote CABG. He also has PAD [please see my notes from [facility] 8/19 included above] ABIs in our clinic were 0.72 on the right 0.86 on the left. These are actually stable to improved to what we recorded last year 7/22; the patient's wound on the lateral part of the left first toe has opened [was blistered last week]. In addition he has the punched-out area in the first second toe webspace. There was apparently concern on the right first toe last week but this is not open this week. There is no concerning area in this location. We use silver alginate with gauze spreading the toes. X-ray was negative 7/29; both open wounds are in the left first and second toe webspace. The area on the first toe looks better. He still has a [name] [name], [name] [name]. ([id number]) deep punched out area over the PIP of the left second toe. There is nothing open on the right. We have been using silver alginate His daughter relates that his ambulatory status is somewhat worse. It is difficult to get him to describe claudication although he is describing some discomfort in the right leg with walking and on the left second toe. He does have known PAD 8/5-Patient returns at 1 week with first and second webspace wounds, daughter points the second webspace also is having third toe medial wound that is new. We are using silver alginate to the wounds. According to the daughter the first place wound appears worse on the great toe Objective Constitutional alert and oriented x 3. sitting or standing blood pressure is within target range for patient.. supine blood pressure is within target  range for patient.. pulse regular and within target range for patient.Marland Kitchen respirations regular, non-labored and within  target range for patient.Marland Kitchen temperature within target range for patient.. Well-nourished and well-hydrated in no acute distress. Vitals Time Taken: 10:48 AM, Height: 69 in, Weight: 150 lbs, BMI: 22.1, Temperature: 99.2 F, Pulse: 68 bpm, Respiratory Rate: 16 breaths/min, Blood Pressure: 156/53 mmHg. General Notes: The first and second webspace wounds are about the same except the great toe wound on the lateral side looks worse with some periwound maceration. I debrided this area with #3 curette with very little bleeding. Integumentary (Hair, Skin) Wound #1 status is Open. Original cause of wound was Gradually Appeared. The wound is located on the [employer]. The wound measures 0.5cm length x 0.4cm width x 0.1cm depth; 0.157cm^2 area and 0.016cm^3 volume. There is Fat Layer (Subcutaneous Tissue) Exposed exposed. There is no tunneling or undermining noted. There is a medium amount of purulent drainage noted. The wound margin is flat and intact. There is small (1-33%) pink granulation within the wound bed. There is a large (67-100%) amount of necrotic tissue within the wound bed including Adherent Slough. Wound #2 status is Open. Original cause of wound was Gradually Appeared. The wound is located on the Left,Medial Toe Second. The wound measures 0.5cm length x 0.6cm width x 0.2cm depth; 0.236cm^2 area and 0.047cm^3 volume. There is Fat Layer (Subcutaneous Tissue) Exposed exposed. There is no tunneling or undermining noted. There is a medium amount of purulent drainage noted. The wound margin is flat and intact. There is small (1-33%) pink granulation within the wound bed. There is a large (67-100%) amount of necrotic tissue within the wound bed including Adherent Slough. Wound #5 status is Open. Original cause of wound was Gradually Appeared. The wound is located on the Left,Medial Toe Third. The wound measures 0.2cm length x 0.1cm width x 0.1cm depth; 0.016cm^2 area and 0.002cm^3 volume.  There is Fat Layer (Subcutaneous Tissue) Exposed exposed. There is no tunneling or undermining noted. There is a medium amount of purulent drainage noted. The wound margin is flat and intact. There is large (67-100%) pink granulation within the wound bed. There is no necrotic tissue within the wound bed. Procedures Wound #1 [name] [name], [name] [name] ([id number]) Pre-procedure diagnosis of Wound #1 is a Pressure Ulcer located on the Left,Medial Toe Great . There was a Excisional Skin/Subcutaneous Tissue Debridement with a total area of 0.2 sq cm performed by [name] Bastos, MD. With the following instrument(s): Curette to remove Viable tissue/material. Material removed includes Subcutaneous Tissue and Slough and after achieving pain control using Lidocaine. No specimens were taken. A time out was conducted at 11:18, prior to the start of the procedure. A Minimum amount of bleeding was controlled with Pressure. The procedure was tolerated well. Post Debridement Measurements: 0.5cm length x 0.4cm width x 0.2cm depth; 0.031cm^3 volume. Post debridement Stage noted as Category/Stage III. Character of Wound/Ulcer Post Debridement is stable. Post procedure Diagnosis Wound #1: Same as Pre-Procedure Plan Wound Cleansing: Wound #1 Left,Medial Toe Great: Dial antibacterial soap, wash wounds, rinse and pat dry prior to dressing wounds - clean between toes daily Wound #2 Left,Medial Toe Second: Dial antibacterial soap, wash wounds, rinse and pat dry prior to dressing wounds - clean between toes daily Anesthetic (add to Medication List): Wound #1 Left,Medial Toe Great: Topical Lidocaine 4% cream applied to wound bed prior to debridement (In Clinic Only). Wound #2 Left,Medial Toe Second: Topical Lidocaine 4% cream applied to wound bed prior to debridement (  In Clinic Only). Primary Wound Dressing: Wound #1 Left,Medial Toe Great: Silver Alginate Wound #2 Left,Medial Toe Second: Silver Alginate Secondary  Dressing: Wound #1 Left,Medial Toe Great: Gauze and Kerlix/Conform - Lambs wool between toes Wound #2 Left,Medial Toe Second: Gauze and Kerlix/Conform - Lambs wool between toes Dressing Change Frequency: Wound #1 Left,Medial Toe Great: Change dressing every day. Wound #2 Left,Medial Toe Second: Change dressing every day. Wound #5 Left,Medial Toe Third: Change dressing every day. Follow-up Appointments: Wound #1 Left,Medial Toe Great: Return Appointment in 1 week. Wound #2 Left,Medial Toe Second: Return Appointment in 1 week. Wound #5 Left,Medial Toe Third: Return Appointment in 1 week. Additional Orders / Instructions: Wound #1 Left,Medial Toe Great: Activity as tolerated Wound #2 Left,Medial Toe Second: Activity as tolerated Wound #5 Left,Medial Toe Third: Activity as tolerated Medications-please add to medication list.: [name] [name], [name] [name] ([id number]) Wound #1 Left,Medial Toe Great: Other: - Nystatin Powder Wound #2 Left,Medial Toe Second: Other: - Nystatin Powder Wound #5 Left,Medial Toe Third: Other: - Nystatin Powder 1. Continue using silver alginate dressing to the wounds, I would add Mycostatin powder considering the possibility of tinea cruris pedis apparently has had in the past 2. Continue dressing changes as before return to clinic next week Electronic Signature(s) Signed: 11/19/2018 11:27:45 AM By: [name] [name] Entered By: [name] [name] on 11/19/2018 11:27:44 [name] [name] ([id number]) -------------------------------------------------------------------------------- Comunas Details Patient Name: [name] [name] Date of Service: 11/19/2018 Medical Record Number: [id number] Patient Account Number: [id number] Date of Birth/Sex: Dec 12, 1932 (85 y.o. M) Treating RN: [name] [name] Primary Care Provider: Lew [name] Other Clinician: Referring Provider: Lew [name] Treating Provider/Extender: [name] [name] in Treatment: 3 Diagnosis Coding ICD-10  Codes Code Description [phone] Pressure ulcer of other site, stage 3 L89.890 Pressure ulcer of other site, unstageable I70.292 Other atherosclerosis of native arteries of extremities, left leg Facility Procedures CPT4 Code: [id number] Description: 50354 - DEB SUBQ TISSUE 20 SQ CM/< ICD-10 Diagnosis Description L89.890 Pressure ulcer of other site, unstageable Modifier: Quantity: 1 Physician Procedures CPT4 Code: 6568127 Description: 51700 - WC PHYS SUBQ TISS 20 SQ CM ICD-10 Diagnosis Description L89.890 Pressure ulcer of other site, unstageable Modifier: Quantity: 1 Electronic Signature(s) Signed: 11/19/2018 11:28:00 AM By: [name] [name] Entered By: [name] [name] on 11/19/2018 11:27:58

## 2018-11-26 ENCOUNTER — Ambulatory Visit: Payer: Medicare Other | Admitting: Internal Medicine

## 2018-12-03 ENCOUNTER — Ambulatory Visit: Payer: Medicare Other | Admitting: Internal Medicine

## 2018-12-30 ENCOUNTER — Ambulatory Visit: Payer: Medicare Other | Admitting: Podiatry

## 2018-12-31 ENCOUNTER — Other Ambulatory Visit: Payer: Self-pay

## 2018-12-31 ENCOUNTER — Encounter: Payer: Self-pay | Admitting: Podiatry

## 2018-12-31 ENCOUNTER — Ambulatory Visit (INDEPENDENT_AMBULATORY_CARE_PROVIDER_SITE_OTHER): Payer: Medicare Other | Admitting: Podiatry

## 2018-12-31 DIAGNOSIS — M79676 Pain in unspecified toe(s): Secondary | ICD-10-CM

## 2018-12-31 DIAGNOSIS — B351 Tinea unguium: Secondary | ICD-10-CM | POA: Diagnosis not present

## 2018-12-31 DIAGNOSIS — D689 Coagulation defect, unspecified: Secondary | ICD-10-CM

## 2018-12-31 DIAGNOSIS — M2010 Hallux valgus (acquired), unspecified foot: Secondary | ICD-10-CM

## 2018-12-31 NOTE — Progress Notes (Signed)
Complaint:  Visit Type: Patient returns to my office for continued preventative foot care services. Complaint: Patient states" my nails have grown long and thick and become painful to walk and wear shoes" Patient has been diagnosed with peripheral arterial disease.. The patient presents for preventative foot care services. No changes to ROS.  Patient is taking plavix.  Podiatric Exam: Vascular: dorsalis pedis and posterior tibial pulses are palpable bilateral. Capillary return is immediate. Temperature gradient is WNL. Skin turgor WNL  Sensorium: Normal Semmes Weinstein monofilament test. Normal tactile sensation bilaterally. Nail Exam: Pt has thick disfigured discolored nails with subungual debris noted bilateral entire nail hallux through fifth toenails Ulcer Exam: There is no evidence of ulcer or pre-ulcerative changes or infection. Orthopedic Exam: Muscle tone and strength are WNL. No limitations in general ROM. No crepitus or effusions noted. Foot type and digits show no abnormalities. HAV 1st MPJ  B/L. Skin: No Porokeratosis. No infection or ulcers  Diagnosis:  Onychomycosis, , Pain in right toe, pain in left toes  HAV  B/L.  Treatment & Plan Procedures and Treatment: Consent by patient was obtained for treatment procedures.   Debridement of mycotic and hypertrophic toenails, 1 through 5 bilateral and clearing of subungual debris. No ulceration, no infection noted.   Return Visit-Office Procedure: Patient instructed to return to the office for a follow up visit 3 months for continued evaluation and treatment.    Gardiner Barefoot DPM

## 2019-01-14 DIAGNOSIS — H401132 Primary open-angle glaucoma, bilateral, moderate stage: Secondary | ICD-10-CM | POA: Diagnosis not present

## 2019-01-14 DIAGNOSIS — H16223 Keratoconjunctivitis sicca, not specified as Sjogren's, bilateral: Secondary | ICD-10-CM | POA: Diagnosis not present

## 2019-01-14 DIAGNOSIS — H43811 Vitreous degeneration, right eye: Secondary | ICD-10-CM | POA: Diagnosis not present

## 2019-01-14 DIAGNOSIS — H25811 Combined forms of age-related cataract, right eye: Secondary | ICD-10-CM | POA: Diagnosis not present

## 2019-04-01 ENCOUNTER — Ambulatory Visit: Payer: Medicare Other | Admitting: Podiatry

## 2019-04-28 ENCOUNTER — Encounter: Payer: Self-pay | Admitting: Internal Medicine

## 2019-04-28 ENCOUNTER — Ambulatory Visit (INDEPENDENT_AMBULATORY_CARE_PROVIDER_SITE_OTHER): Payer: Medicare Other | Admitting: Internal Medicine

## 2019-04-28 ENCOUNTER — Other Ambulatory Visit: Payer: Self-pay

## 2019-04-28 VITALS — BP 148/86 | HR 68 | Temp 98.5°F | Ht 68.0 in | Wt 143.0 lb

## 2019-04-28 DIAGNOSIS — F0280 Dementia in other diseases classified elsewhere without behavioral disturbance: Secondary | ICD-10-CM | POA: Diagnosis not present

## 2019-04-28 DIAGNOSIS — E785 Hyperlipidemia, unspecified: Secondary | ICD-10-CM

## 2019-04-28 DIAGNOSIS — R7309 Other abnormal glucose: Secondary | ICD-10-CM | POA: Diagnosis not present

## 2019-04-28 DIAGNOSIS — L03115 Cellulitis of right lower limb: Secondary | ICD-10-CM | POA: Diagnosis not present

## 2019-04-28 DIAGNOSIS — G301 Alzheimer's disease with late onset: Secondary | ICD-10-CM

## 2019-04-28 DIAGNOSIS — L0390 Cellulitis, unspecified: Secondary | ICD-10-CM | POA: Insufficient documentation

## 2019-04-28 DIAGNOSIS — M7061 Trochanteric bursitis, right hip: Secondary | ICD-10-CM | POA: Diagnosis not present

## 2019-04-28 LAB — LIPID PANEL
Cholesterol: 273 mg/dL — ABNORMAL HIGH (ref 0–200)
HDL: 70.1 mg/dL (ref >39.00)
LDL Cholesterol: 192 mg/dL — ABNORMAL HIGH (ref 0–99)
NonHDL: 203.09
Total CHOL/HDL Ratio: 4
Triglycerides: 54 mg/dL (ref 0.0–149.0)
VLDL: 10.8 mg/dL (ref 0.0–40.0)

## 2019-04-28 LAB — URINALYSIS
Bilirubin Urine: NEGATIVE
Hgb urine dipstick: NEGATIVE
Ketones, ur: NEGATIVE
Leukocytes,Ua: NEGATIVE
Nitrite: NEGATIVE
Specific Gravity, Urine: >=1.03 — AB (ref 1.000–1.030)
Total Protein, Urine: NEGATIVE
Urine Glucose: NEGATIVE
Urobilinogen, UA: 0.2 (ref 0.0–1.0)
pH: 6 (ref 5.0–8.0)

## 2019-04-28 LAB — BASIC METABOLIC PANEL
BUN: 18 mg/dL (ref 6–23)
CO2: 26 mEq/L (ref 19–32)
Calcium: 9.4 mg/dL (ref 8.4–10.5)
Chloride: 107 mEq/L (ref 96–112)
Creatinine, Ser: 1.03 mg/dL (ref 0.40–1.50)
GFR: 83.17 mL/min (ref >60.00)
Glucose, Bld: 89 mg/dL (ref 70–99)
Potassium: 4.1 mEq/L (ref 3.5–5.1)
Sodium: 139 mEq/L (ref 135–145)

## 2019-04-28 LAB — CBC WITH DIFFERENTIAL/PLATELET
Basophils Absolute: 0 10*3/uL (ref 0.0–0.1)
Basophils Relative: 0.3 % (ref 0.0–3.0)
Eosinophils Absolute: 0.2 10*3/uL (ref 0.0–0.7)
Eosinophils Relative: 4.6 % (ref 0.0–5.0)
HCT: 37.7 % — ABNORMAL LOW (ref 39.0–52.0)
Hemoglobin: 12.1 g/dL — ABNORMAL LOW (ref 13.0–17.0)
Lymphocytes Relative: 25.8 % (ref 12.0–46.0)
Lymphs Abs: 1.3 10*3/uL (ref 0.7–4.0)
MCHC: 32 g/dL (ref 30.0–36.0)
MCV: 81.9 fl (ref 78.0–100.0)
Monocytes Absolute: 0.4 10*3/uL (ref 0.1–1.0)
Monocytes Relative: 7.8 % (ref 3.0–12.0)
Neutro Abs: 3.2 10*3/uL (ref 1.4–7.7)
Neutrophils Relative %: 61.5 % (ref 43.0–77.0)
Platelets: 194 10*3/uL (ref 150.0–400.0)
RBC: 4.6 Mil/uL (ref 4.22–5.81)
RDW: 16.3 % — ABNORMAL HIGH (ref 11.5–15.5)
WBC: 5.2 10*3/uL (ref 4.0–10.5)

## 2019-04-28 LAB — HEPATIC FUNCTION PANEL
ALT: 10 U/L (ref 0–53)
AST: 22 U/L (ref 0–37)
Albumin: 4 g/dL (ref 3.5–5.2)
Alkaline Phosphatase: 91 U/L (ref 39–117)
Bilirubin, Direct: 0.2 mg/dL (ref 0.0–0.3)
Total Bilirubin: 1.1 mg/dL (ref 0.2–1.2)
Total Protein: 6.6 g/dL (ref 6.0–8.3)

## 2019-04-28 LAB — TSH: TSH: 0.94 u[IU]/mL (ref 0.35–4.50)

## 2019-04-28 MED ORDER — SERTRALINE HCL 25 MG PO TABS: 25.0000 mg | ORAL_TABLET | Freq: Every day | ORAL | 5 refills | Status: DC

## 2019-04-28 MED ORDER — B COMPLEX PLUS PO TABS: 1.0000 | ORAL_TABLET | Freq: Every day | ORAL | 3 refills | Status: DC

## 2019-04-28 MED ORDER — DOXYCYCLINE HYCLATE 100 MG PO TABS: 100.0000 mg | ORAL_TABLET | Freq: Two times a day (BID) | ORAL | 0 refills | Status: DC

## 2019-04-28 NOTE — Patient Instructions (Signed)
Rice sock Hip opener exercises Ice

## 2019-04-28 NOTE — Assessment & Plan Note (Signed)
Off meds - intolerant

## 2019-04-28 NOTE — Assessment & Plan Note (Addendum)
Worse F/u w/Dr Delice Lesch pending Start low dose Zoloft Vit d, B complex

## 2019-04-28 NOTE — Assessment & Plan Note (Signed)
Doxy x 10 d 

## 2019-04-28 NOTE — Progress Notes (Signed)
Subjective:  Patient ID: [name] [name], male    DOB: Mar 03, 1933  Age: 86 y.o. MRN: [id number]  CC: No chief complaint on file.   HPI [name] [name] presents for dementia, insomnia, agitation at night Comes in w/dtr [name] They have a 15 wks old puppy C/o painful red R leg x2-3 d   Outpatient Medications Prior to Visit  Medication Sig Dispense Refill  . Cholecalciferol (VITAMIN D3) 1000 UNITS CAPS Take 1 capsule by mouth daily.      . cycloSPORINE (RESTASIS) 0.05 % ophthalmic emulsion Place 1 drop into both eyes 2 (two) times daily.    . fluticasone (FLONASE) 50 MCG/ACT nasal spray     . NONFORMULARY OR COMPOUNDED ITEM Topical anesthetic was sent to [facility]    . nystatin (MYCOSTATIN/NYSTOP) powder APPLY BETWEEN TOES AT DRESSING CHANGE FOR 1 WEEK    . TRAVATAN Z 0.004 % SOLN ophthalmic solution Place 1 drop into both eyes daily.     No facility-administered medications prior to visit.    ROS: Review of Systems  Constitutional: Positive for unexpected weight change. Negative for appetite change and fatigue.  HENT: Negative for congestion, nosebleeds, sneezing, sore throat and trouble swallowing.   Eyes: Negative for itching and visual disturbance.  Respiratory: Negative for cough.   Cardiovascular: Negative for chest pain, palpitations and leg swelling.  Gastrointestinal: Negative for abdominal distention, blood in stool, diarrhea and nausea.  Genitourinary: Negative for frequency and hematuria.  Musculoskeletal: Negative for back pain, gait problem, joint swelling and neck pain.  Skin: Negative for rash.  Neurological: Negative for dizziness, tremors, speech difficulty and weakness.  Psychiatric/Behavioral: Positive for agitation, confusion, decreased concentration and sleep disturbance. Negative for dysphoric mood and suicidal ideas. The patient is nervous/anxious.     Objective:  BP (!) 148/86 (BP Location: Left Arm, Patient Position: Sitting, Cuff Size: Normal)    Pulse 68   Temp 98.5 F (36.9 C) (Oral)   Ht 5\' 8"  (1.727 m)   Wt 143 lb (64.9 kg)   SpO2 98%   BMI 21.74 kg/m   BP Readings from Last 3 Encounters:  04/28/19 (!) 148/86  09/05/18 (!) 142/76  05/12/18 134/68    Wt Readings from Last 3 Encounters:  04/28/19 143 lb (64.9 kg)  10/23/18 150 lb (68 kg)  09/05/18 150 lb (68 kg)    Physical Exam Constitutional:      General: He is not in acute distress.    Appearance: Normal appearance. He is well-developed.     Comments: NAD  Eyes:     Conjunctiva/sclera: Conjunctivae normal.     Pupils: Pupils are equal, round, and reactive to light.  Neck:     Thyroid: No thyromegaly.     Vascular: No JVD.  Cardiovascular:     Rate and Rhythm: Normal rate and regular rhythm.     Heart sounds: Normal heart sounds. No murmur. No friction rub. No gallop.   Pulmonary:     Effort: Pulmonary effort is normal. No respiratory distress.     Breath sounds: Normal breath sounds. No wheezing or rales.  Chest:     Chest wall: No tenderness.  Abdominal:     General: Bowel sounds are normal. There is no distension.     Palpations: Abdomen is soft. There is no mass.     Tenderness: There is no abdominal tenderness. There is no guarding or rebound.  Musculoskeletal:        General: Tenderness present. Normal range of  motion.     Cervical back: Normal range of motion.  Lymphadenopathy:     Cervical: No cervical adenopathy.  Skin:    General: Skin is warm and dry.     Findings: No rash.  Neurological:     Mental Status: He is alert and oriented to person, place, and time.     Cranial Nerves: No cranial nerve deficit.     Motor: No abnormal muscle tone.     Coordination: Coordination normal.     Gait: Gait normal.     Deep Tendon Reflexes: Reflexes are normal and symmetric.  Psychiatric:        Behavior: Behavior normal.        Thought Content: Thought content normal.        Judgment: Judgment normal.   R lat hip tender RLE - red,  warm  >40 min  Lab Results  Component Value Date   WBC 5.5 09/05/2018   HGB 11.8 (L) 09/05/2018   HCT 36.3 (L) 09/05/2018   PLT 181.0 09/05/2018   GLUCOSE 72 09/05/2018   CHOL 297 (H) 09/05/2018   TRIG 56.0 09/05/2018   HDL 74.80 09/05/2018   LDLDIRECT 136.2 09/28/2009   LDLCALC 211 (H) 09/05/2018   ALT 11 09/05/2018   AST 19 09/05/2018   NA 143 09/05/2018   K 3.8 09/05/2018   CL 108 09/05/2018   CREATININE 1.06 09/05/2018   BUN 23 09/05/2018   CO2 26 09/05/2018   TSH 0.91 09/05/2018   PSA 0.37 07/23/2017   INR 1.1 ratio (H) 10/06/2009   HGBA1C 5.6 12/09/2013    DG Foot Complete Left  Result Date: 10/29/2018 CLINICAL DATA:  Second toe open wound EXAM: LEFT FOOT - COMPLETE 3+ VIEW COMPARISON:  None. FINDINGS: No acute fracture or dislocation is noted. Mild tarsal degenerative changes are seen. Calcaneal spurring is noted. No definitive bony erosive changes are identified to suggest osteomyelitis. IMPRESSION: Mild degenerative change.  No findings to suggest osteomyelitis. Electronically Signed   By: Inez Catalina M.D.   On: 10/29/2018 15:45    Assessment & Plan:   There are no diagnoses linked to this encounter.   No orders of the defined types were placed in this encounter.    Follow-up: No follow-ups on file.  Walker Kehr, MD

## 2019-04-28 NOTE — Assessment & Plan Note (Signed)
Rice sock Hip opener exercises Ice

## 2019-05-15 ENCOUNTER — Telehealth (INDEPENDENT_AMBULATORY_CARE_PROVIDER_SITE_OTHER): Payer: Medicare Other | Admitting: Neurology

## 2019-05-15 ENCOUNTER — Other Ambulatory Visit: Payer: Self-pay

## 2019-05-15 DIAGNOSIS — F03B18 Unspecified dementia, moderate, with other behavioral disturbance: Secondary | ICD-10-CM

## 2019-05-15 DIAGNOSIS — F0391 Unspecified dementia with behavioral disturbance: Secondary | ICD-10-CM

## 2019-05-15 NOTE — Progress Notes (Signed)
Virtual Visit via Video Note The purpose of this virtual visit is to provide medical care while limiting exposure to the novel coronavirus.    Consent was obtained for video visit:  Yes.   Answered questions that patient had about telehealth interaction:  Yes.   I discussed the limitations, risks, security and privacy concerns of performing an evaluation and management service by telemedicine. I also discussed with the patient that there may be a patient responsible charge related to this service. The patient expressed understanding and agreed to proceed.  Pt location: Home Physician Location: office Name of referring provider:  Plotnikov, Evie Lacks, MD I connected with [name] [name] at patients initiation/request on 05/15/2019 at  2:00 PM EST by video enabled telemedicine application and verified that I am speaking with the correct person using two identifiers. Pt MRN:  [id number] Pt DOB:  Jul 31, 1932 Video Participants:  [name] [name];  [name] Heinecke-Jeter (daughter)   History of Present Illness:  The patient was seen as a virtual video visit on 05/15/2019 He was last seen 6 months ago for moderate dementia. His daughter [name] is present during the visit to provide additional information. Since his last visit, he reports he is doing pretty good Candy reports he is having more difficulty following instructions. He thinks that advertisements are bills. He is independent with dressing and bathing. He gets a little antsy and irritable, his PCP started Zoloft which has balanced this out. It has also helped his sleep get more regimented. He is on 1/2 tablet and will be increasing to 1 tablet next week. Appetite is good. He denies any headaches. He reports an ear problem, when he talks or listens, sounds are very loud. No ear pain. He has dizziness when standing. He feels like he will fall to the right side but denies any falls. He has right hand numbness due to carpal tunnel syndrome. Hallucinations are  not happening as much, sometimes he thinks there are people at the door.   History on Initial Assessment 10/24/2018: This is a pleasant 86 year old right-handed man with a history of diet-controlled hypertension, hyperlipidemia, CAD, presenting for evaluation of memory loss. His daughter [name] is present during the e-visit to provide additional information. He states his memory is "not too good, not too bad." Candy reports that memory issues came to a head when he got lost driving 2 years ago, he was supposed to go to the grocery and ended up in [place] then was on the other side of the highway coming back to [place]. He stopped driving at that time and Candy moved in to help him. She reports he had been pretty good with his medications prior to her moving in, but she had noticed he had been struggling with them over the years and had requested weaning him off all medications, he has been off all his medications for 4 months with no issues. [name] has been managing finances over the past 2 years as well. She reports he misplaces things frequently and has paranoia that someone is taking them. He lost his razor and shaving cream and they still have not found them. He lost his wallet one time and found it in the car. He does not cook. He is independent with dressing and bathing, he can do his own laundry and operate the washing machine but it takes him a longer time. Around 3-6 months ago, he started having auditory hallucinations, one time at midnight he heard a lady calling and  he went outside. He was started on Donepezil which his daughter reports made symptoms worse, he was having nightmares. He has not mentioned any further hallucinations to his daughter, but she can tell he is struggling with it around every other week where he would be talking to the TV more, thinking they are waving at him.   He denies any headaches, diplopia, dysarthria, neck pain, focal weakness. He has occasional dizziness, problems  swallowing, back pain, and right hand numbness. He has occasional constipation. He has noticed a reduced sense of smell the past few months. He has occasional hand tremors that do not affect writing or using utensils. His daughter started noticing a mild head tremor 3 years ago. His daughter reports his balance is off, which makes him skittish. His last fall was 2 years ago, he fell backward on a bench. She is concerned about poor weight gain, initially attributed to living alone and forgetting to eat, but even with Candy cooking and giving Ensure, he is eating fine but not gaining much weight.   I personally reviewed MRI brain without contrast done 05/2018 which did not show any acute changes, there was moderate diffuse atrophy, minimal chronic microvascular disease.  Laboratory Data: Lab Results  Component Value Date   TSH 0.94 04/28/2019   Lab Results  Component Value Date   VITAMINB12 460 05/02/2018   MEDICATIONS: Current Outpatient Medications on File Prior to Visit  Medication Sig Dispense Refill  . Cholecalciferol (VITAMIN D3) 1000 UNITS CAPS Take 1 capsule by mouth daily.      . cycloSPORINE (RESTASIS) 0.05 % ophthalmic emulsion Place 1 drop into both eyes 2 (two) times daily.    . fluticasone (FLONASE) 50 MCG/ACT nasal spray     . NONFORMULARY OR COMPOUNDED ITEM Topical anesthetic was sent to [facility]    . TRAVATAN Z 0.004 % SOLN ophthalmic solution Place 1 drop into both eyes daily.     No current facility-administered medications on file prior to visit.     Observations/Objective:   GEN:  The patient appears stated age and is in NAD. No facial hypomimia.  Neurological examination: Patient is awake, alert, oriented x 3. No aphasia or dysarthria. Intact fluency and comprehension. Remote and recent memory impaired. SLUMS score 9/30.  [employer] Mental Exam 05/15/2019  Weekday Correct 1  Current year 1  What state are we in? 1  Amount spent 0  Amount left 0    # of Animals 1  5 objects recall 0  Number series 1  Hour markers 0  Time correct 0  Placed X in triangle correctly 1  Largest Figure 1  Name of male 2  Date back to work 0  Type of work 0  State she lived in 0  Total score 9   Cranial nerves: Extraocular movements intact with no nystagmus. No facial asymmetry.  Motor: moves all extremities symmetrically, at least anti-gravity x 4. No incoordination on finger to nose testing. Gait: slow and cautious, no ataxia, good arm swing. Good finger taps.   Assessment and Plan:   This is a pleasant 86 yo RH man with diet-controlled hypertension, hyperlipidemia, CAD, with moderate dementia with behavioral disturbance likely due to Alzheimer's disease. MRI brain showed moderate diffuse atrophy, minimal chronic microvascular disease. SLUMS score today 9/30. He had side effects on Donepezil, his daughter would like to hold off on other medications at this time. He was recently started on Zoloft which has helped with mood/sleep. Continue 24/7 supervision.  He does not drive. Follow-up in 6 months, they know to call for any changes.   Follow Up Instructions:   -I discussed the assessment and treatment plan with the patient/daughter. The patient/daughter were provided an opportunity to ask questions and all were answered. The patient/daughter agreed with the plan and demonstrated an understanding of the instructions.   The patient/daughter were advised to call back or seek an in-person evaluation if the symptoms worsen or if the condition fails to improve as anticipated.   Cameron Sprang, MD

## 2019-06-16 DIAGNOSIS — H16223 Keratoconjunctivitis sicca, not specified as Sjogren's, bilateral: Secondary | ICD-10-CM | POA: Diagnosis not present

## 2019-06-16 DIAGNOSIS — H25811 Combined forms of age-related cataract, right eye: Secondary | ICD-10-CM | POA: Diagnosis not present

## 2019-06-16 DIAGNOSIS — H401132 Primary open-angle glaucoma, bilateral, moderate stage: Secondary | ICD-10-CM | POA: Diagnosis not present

## 2019-06-16 DIAGNOSIS — H43811 Vitreous degeneration, right eye: Secondary | ICD-10-CM | POA: Diagnosis not present

## 2019-06-30 DIAGNOSIS — H2511 Age-related nuclear cataract, right eye: Secondary | ICD-10-CM | POA: Diagnosis not present

## 2019-06-30 DIAGNOSIS — H52203 Unspecified astigmatism, bilateral: Secondary | ICD-10-CM | POA: Diagnosis not present

## 2019-07-21 ENCOUNTER — Ambulatory Visit (INDEPENDENT_AMBULATORY_CARE_PROVIDER_SITE_OTHER): Payer: Medicare Other | Admitting: Podiatry

## 2019-07-21 ENCOUNTER — Other Ambulatory Visit: Payer: Self-pay

## 2019-07-21 ENCOUNTER — Encounter: Payer: Self-pay | Admitting: Podiatry

## 2019-07-21 VITALS — Temp 98.6°F

## 2019-07-21 DIAGNOSIS — B351 Tinea unguium: Secondary | ICD-10-CM

## 2019-07-21 DIAGNOSIS — I739 Peripheral vascular disease, unspecified: Secondary | ICD-10-CM

## 2019-07-21 DIAGNOSIS — M79676 Pain in unspecified toe(s): Secondary | ICD-10-CM

## 2019-07-21 NOTE — Progress Notes (Signed)
This patient returns to my office for at risk foot care.  This patient requires this care by a professional since this patient will be at risk due to having peripheral arterial disease. This patient is unable to cut nails himself since the patient cannot reach his nails.These nails are painful walking and wearing shoes.  This patient presents for at risk foot care today. Patient presents with his daughter.  General Appearance  Alert, conversant and in no acute stress.  Vascular  Dorsalis pedis and posterior tibial  pulses are weakly  palpable  bilaterally.  Capillary return is within normal limits  bilaterally. Temperature is within normal limits  bilaterally.  Neurologic  Senn-Weinstein monofilament wire test within normal limits  bilaterally. Muscle power within normal limits bilaterally.  Nails Thick disfigured discolored nails with subungual debris  from hallux to fifth toes bilaterally. No evidence of bacterial infection or drainage bilaterally.  Orthopedic  No limitations of motion  feet .  No crepitus or effusions noted.  No bony pathology or digital deformities noted.  HAV  B/L.  Skin  normotropic skin with no porokeratosis noted bilaterally.  No signs of infections or ulcers noted.     Onychomycosis  Pain in right toes  Pain in left toes  Consent was obtained for treatment procedures.   Mechanical debridement of nails 1-5  bilaterally performed with a nail nipper.  Filed with dremel without incident.    Return office visit   3 months                   Told patient to return for periodic foot care and evaluation due to potential at risk complications.   Gardiner Barefoot DPM

## 2019-07-27 ENCOUNTER — Telehealth: Payer: Self-pay

## 2019-07-27 NOTE — Telephone Encounter (Signed)
New message   The daughter on the phone need to talk with Dr.Plotnikov on referral home health to come into the home   Asking CNA.   Agency: open for a suggestion or will call back with the agency she decided to go with.

## 2019-07-28 NOTE — Telephone Encounter (Signed)
Pt needs OV 

## 2019-07-29 NOTE — Telephone Encounter (Signed)
F/u   Call and spoken with Daughter [name].    Appt with Dr. Alain Marion on  4.20.21 @ 9:10am

## 2019-08-04 ENCOUNTER — Encounter: Payer: Self-pay | Admitting: Internal Medicine

## 2019-08-04 ENCOUNTER — Ambulatory Visit (INDEPENDENT_AMBULATORY_CARE_PROVIDER_SITE_OTHER): Payer: Medicare Other | Admitting: Internal Medicine

## 2019-08-04 ENCOUNTER — Other Ambulatory Visit: Payer: Self-pay

## 2019-08-04 VITALS — BP 154/76 | HR 77 | Temp 98.4°F | Ht 68.0 in | Wt 143.0 lb

## 2019-08-04 DIAGNOSIS — R627 Adult failure to thrive: Secondary | ICD-10-CM | POA: Diagnosis not present

## 2019-08-04 DIAGNOSIS — F0280 Dementia in other diseases classified elsewhere without behavioral disturbance: Secondary | ICD-10-CM | POA: Diagnosis not present

## 2019-08-04 DIAGNOSIS — G301 Alzheimer's disease with late onset: Secondary | ICD-10-CM

## 2019-08-04 DIAGNOSIS — E559 Vitamin D deficiency, unspecified: Secondary | ICD-10-CM | POA: Diagnosis not present

## 2019-08-04 LAB — CBC WITH DIFFERENTIAL/PLATELET
Basophils Absolute: 0 10*3/uL (ref 0.0–0.1)
Basophils Relative: 0.6 % (ref 0.0–3.0)
Eosinophils Absolute: 0.3 10*3/uL (ref 0.0–0.7)
Eosinophils Relative: 4.3 % (ref 0.0–5.0)
HCT: 37.4 % — ABNORMAL LOW (ref 39.0–52.0)
Hemoglobin: 12.2 g/dL — ABNORMAL LOW (ref 13.0–17.0)
Lymphocytes Relative: 25.3 % (ref 12.0–46.0)
Lymphs Abs: 1.8 10*3/uL (ref 0.7–4.0)
MCHC: 32.6 g/dL (ref 30.0–36.0)
MCV: 83.6 fl (ref 78.0–100.0)
Monocytes Absolute: 0.5 10*3/uL (ref 0.1–1.0)
Monocytes Relative: 7.5 % (ref 3.0–12.0)
Neutro Abs: 4.4 10*3/uL (ref 1.4–7.7)
Neutrophils Relative %: 62.3 % (ref 43.0–77.0)
Platelets: 202 10*3/uL (ref 150.0–400.0)
RBC: 4.47 Mil/uL (ref 4.22–5.81)
RDW: 15.7 % — ABNORMAL HIGH (ref 11.5–15.5)
WBC: 7 10*3/uL (ref 4.0–10.5)

## 2019-08-04 LAB — BASIC METABOLIC PANEL
BUN: 21 mg/dL (ref 6–23)
CO2: 28 mEq/L (ref 19–32)
Calcium: 9.6 mg/dL (ref 8.4–10.5)
Chloride: 105 mEq/L (ref 96–112)
Creatinine, Ser: 1.01 mg/dL (ref 0.40–1.50)
GFR: 85.02 mL/min (ref >60.00)
Glucose, Bld: 58 mg/dL — ABNORMAL LOW (ref 70–99)
Potassium: 4.3 mEq/L (ref 3.5–5.1)
Sodium: 140 mEq/L (ref 135–145)

## 2019-08-04 LAB — VITAMIN D 25 HYDROXY (VIT D DEFICIENCY, FRACTURES): VITD: 25.89 ng/mL — ABNORMAL LOW (ref 30.00–100.00)

## 2019-08-04 LAB — TSH: TSH: 1.29 u[IU]/mL (ref 0.35–4.50)

## 2019-08-04 MED ORDER — MIRTAZAPINE 15 MG PO TABS: ORAL_TABLET | ORAL | 5 refills | Status: DC

## 2019-08-04 MED ORDER — TRIAMCINOLONE ACETONIDE 0.1 % EX OINT: 1.0000 "application " | TOPICAL_OINTMENT | Freq: Two times a day (BID) | CUTANEOUS | 2 refills | Status: DC

## 2019-08-04 NOTE — Assessment & Plan Note (Signed)
Worse HH ref

## 2019-08-04 NOTE — Progress Notes (Signed)
Subjective:  Patient ID: [name] [name], male    DOB: [birth year]  Age: 86 y.o. MRN: [id number]  CC: No chief complaint on file.   HPI [name] [name] presents for memory loss - worse, FTT, falls, poor balance, anxiety Dtr [name] - goes back to work - discussed  Outpatient Medications Prior to Visit  Medication Sig Dispense Refill  . B Complex-Folic Acid (B COMPLEX PLUS) TABS Take 1 tablet by mouth daily. 100 tablet 3  . Cholecalciferol (VITAMIN D3) 1000 UNITS CAPS Take 1 capsule by mouth daily.      . cycloSPORINE (RESTASIS) 0.05 % ophthalmic emulsion Place 1 drop into both eyes 2 (two) times daily.    . fluticasone (FLONASE) 50 MCG/ACT nasal spray     . NONFORMULARY OR COMPOUNDED ITEM Topical anesthetic was sent to [facility]    . sertraline (ZOLOFT) 25 MG tablet Take 1 tablet (25 mg total) by mouth daily. 30 tablet 5  . TRAVATAN Z 0.004 % SOLN ophthalmic solution Place 1 drop into both eyes daily.     No facility-administered medications prior to visit.    ROS: Review of Systems  Constitutional: Negative for appetite change, fatigue and unexpected weight change.  HENT: Negative for congestion, nosebleeds, sneezing, sore throat and trouble swallowing.   Eyes: Negative for itching and visual disturbance.  Respiratory: Negative for cough.   Cardiovascular: Negative for chest pain, palpitations and leg swelling.  Gastrointestinal: Negative for abdominal distention, blood in stool, diarrhea and nausea.  Genitourinary: Negative for frequency and hematuria.  Musculoskeletal: Positive for arthralgias and gait problem. Negative for back pain, joint swelling and neck pain.  Skin: Positive for color change and rash.  Neurological: Positive for weakness. Negative for dizziness, tremors and speech difficulty.  Psychiatric/Behavioral: Positive for confusion and decreased concentration. Negative for agitation, dysphoric mood, sleep disturbance and suicidal ideas. The patient is not  nervous/anxious.     Objective:  BP (!) 154/76 (BP Location: Left Arm, Patient Position: Sitting, Cuff Size: Normal)   Pulse 77   Temp 98.4 F (36.9 C) (Oral)   Ht 5\' 8"  (1.727 m)   Wt 143 lb (64.9 kg)   SpO2 98%   BMI 21.74 kg/m   BP Readings from Last 3 Encounters:  08/04/19 (!) 154/76  04/28/19 (!) 148/86  09/05/18 (!) 142/76    Wt Readings from Last 3 Encounters:  08/04/19 143 lb (64.9 kg)  05/14/19 147 lb (66.7 kg)  04/28/19 143 lb (64.9 kg)    Physical Exam Constitutional:      General: He is not in acute distress.    Appearance: He is well-developed.     Comments: NAD  Eyes:     Conjunctiva/sclera: Conjunctivae normal.     Pupils: Pupils are equal, round, and reactive to light.  Neck:     Thyroid: No thyromegaly.     Vascular: No JVD.  Cardiovascular:     Rate and Rhythm: Normal rate and regular rhythm.     Heart sounds: Normal heart sounds. No murmur. No friction rub. No gallop.   Pulmonary:     Effort: Pulmonary effort is normal. No respiratory distress.     Breath sounds: Normal breath sounds. No wheezing or rales.  Chest:     Chest wall: No tenderness.  Abdominal:     General: Bowel sounds are normal. There is no distension.     Palpations: Abdomen is soft. There is no mass.     Tenderness: There is no abdominal  tenderness. There is no guarding or rebound.  Musculoskeletal:        General: No tenderness. Normal range of motion.     Cervical back: Normal range of motion.  Lymphadenopathy:     Cervical: No cervical adenopathy.  Skin:    General: Skin is warm and dry.     Findings: Erythema and rash present.  Neurological:     Mental Status: He is alert. He is disoriented.     Cranial Nerves: No cranial nerve deficit.     Motor: Weakness present. No abnormal muscle tone.     Coordination: Coordination abnormal.     Gait: Gait abnormal.     Deep Tendon Reflexes: Reflexes are normal and symmetric.  Psychiatric:        Mood and Affect: Mood  normal.        Behavior: Behavior normal.   Distal shins rash R>L L knee w/scabs - treated, dressed   FTF>40 min discussing FTT, dementia, balance related issues   Lab Results  Component Value Date   WBC 5.2 04/28/2019   HGB 12.1 (L) 04/28/2019   HCT 37.7 (L) 04/28/2019   PLT 194.0 04/28/2019   GLUCOSE 89 04/28/2019   CHOL 273 (H) 04/28/2019   TRIG 54.0 04/28/2019   HDL 70.10 04/28/2019   LDLDIRECT 136.2 09/28/2009   LDLCALC 192 (H) 04/28/2019   ALT 10 04/28/2019   AST 22 04/28/2019   NA 139 04/28/2019   K 4.1 04/28/2019   CL 107 04/28/2019   CREATININE 1.03 04/28/2019   BUN 18 04/28/2019   CO2 26 04/28/2019   TSH 0.94 04/28/2019   PSA 0.37 07/23/2017   INR 1.1 ratio (H) 10/06/2009   HGBA1C 5.6 12/09/2013    DG Foot Complete Left  Result Date: 10/29/2018 CLINICAL DATA:  Second toe open wound EXAM: LEFT FOOT - COMPLETE 3+ VIEW COMPARISON:  None. FINDINGS: No acute fracture or dislocation is noted. Mild tarsal degenerative changes are seen. Calcaneal spurring is noted. No definitive bony erosive changes are identified to suggest osteomyelitis. IMPRESSION: Mild degenerative change.  No findings to suggest osteomyelitis. Electronically Signed   By: Inez Catalina M.D.   On: 10/29/2018 15:45    Assessment & Plan:    Walker Kehr, MD

## 2019-08-04 NOTE — Assessment & Plan Note (Signed)
HH ref

## 2019-08-05 ENCOUNTER — Other Ambulatory Visit: Payer: Self-pay | Admitting: Internal Medicine

## 2019-08-05 MED ORDER — VITAMIN D3 50 MCG (2000 UT) PO CAPS: 2000.0000 [IU] | ORAL_CAPSULE | Freq: Every day | ORAL | 3 refills | Status: AC

## 2019-08-05 MED ORDER — VITAMIN D3 1.25 MG (50000 UT) PO CAPS: 1.0000 | ORAL_CAPSULE | ORAL | 0 refills | Status: DC

## 2019-08-06 ENCOUNTER — Telehealth: Payer: Self-pay | Admitting: Internal Medicine

## 2019-08-06 DIAGNOSIS — H59211 Accidental puncture and laceration of right eye and adnexa during an ophthalmic procedure: Secondary | ICD-10-CM | POA: Diagnosis not present

## 2019-08-06 DIAGNOSIS — H25811 Combined forms of age-related cataract, right eye: Secondary | ICD-10-CM | POA: Diagnosis not present

## 2019-08-06 DIAGNOSIS — H2511 Age-related nuclear cataract, right eye: Secondary | ICD-10-CM | POA: Diagnosis not present

## 2019-08-06 NOTE — Telephone Encounter (Signed)
Pt's dtr is wanting a letter to explain how she had to move in with her dad back in 2017 and if she didn't he would have to go into assisted living.   Can you please give her a call at [phone] to discuss this.

## 2019-08-07 NOTE — Telephone Encounter (Signed)
Spoke with [name] and she is needing a letter stating she moved in with her father in May 2017 from him having an episode of not knowing where he was and to keep him out of an assisted living facility. She is looking at in home care. Please advise if okay to write letter?

## 2019-08-10 NOTE — Telephone Encounter (Signed)
Ok Thx 

## 2019-08-12 NOTE — Telephone Encounter (Signed)
Candy notified letter written and placed up front to be picked up

## 2019-08-28 ENCOUNTER — Other Ambulatory Visit: Payer: Self-pay | Admitting: Internal Medicine

## 2019-09-08 DIAGNOSIS — Z0279 Encounter for issue of other medical certificate: Secondary | ICD-10-CM

## 2019-09-23 ENCOUNTER — Other Ambulatory Visit: Payer: Self-pay | Admitting: Internal Medicine

## 2019-10-06 ENCOUNTER — Ambulatory Visit: Payer: Medicare Other | Admitting: Internal Medicine

## 2019-10-07 ENCOUNTER — Other Ambulatory Visit: Payer: Self-pay

## 2019-10-07 ENCOUNTER — Ambulatory Visit (INDEPENDENT_AMBULATORY_CARE_PROVIDER_SITE_OTHER): Payer: Medicare Other | Admitting: Internal Medicine

## 2019-10-07 ENCOUNTER — Encounter: Payer: Self-pay | Admitting: Internal Medicine

## 2019-10-07 DIAGNOSIS — F0280 Dementia in other diseases classified elsewhere without behavioral disturbance: Secondary | ICD-10-CM | POA: Diagnosis not present

## 2019-10-07 DIAGNOSIS — R634 Abnormal weight loss: Secondary | ICD-10-CM

## 2019-10-07 DIAGNOSIS — I2510 Atherosclerotic heart disease of native coronary artery without angina pectoris: Secondary | ICD-10-CM

## 2019-10-07 DIAGNOSIS — G301 Alzheimer's disease with late onset: Secondary | ICD-10-CM

## 2019-10-07 DIAGNOSIS — E785 Hyperlipidemia, unspecified: Secondary | ICD-10-CM | POA: Diagnosis not present

## 2019-10-07 MED ORDER — DOXYCYCLINE HYCLATE 100 MG PO TABS: 100.0000 mg | ORAL_TABLET | Freq: Two times a day (BID) | ORAL | 0 refills | Status: DC

## 2019-10-07 MED ORDER — TRIAMCINOLONE ACETONIDE 0.1 % EX OINT: 1.0000 "application " | TOPICAL_OINTMENT | Freq: Two times a day (BID) | CUTANEOUS | 3 refills | Status: DC

## 2019-10-07 NOTE — Assessment & Plan Note (Signed)
Better  

## 2019-10-07 NOTE — Assessment & Plan Note (Signed)
F/u w/Neurology

## 2019-10-07 NOTE — Assessment & Plan Note (Signed)
Crestor, Zetia, ASA, Plavix - d/c. Off meds - does not want to take any meds 5/20 1/21 Off meds - intolerant

## 2019-10-07 NOTE — Patient Instructions (Signed)

## 2019-10-07 NOTE — Progress Notes (Signed)
Subjective:  Patient ID: [name] [name], male    DOB: 06-10-1932  Age: 86 y.o. MRN: [id number]  CC: No chief complaint on file.   HPI [name] [name] presents for a FTT C/o a rash on RLE C/o wt loss Pt had a JJ COVID 19 shot  Outpatient Medications Prior to Visit  Medication Sig Dispense Refill  . B Complex-Folic Acid (B COMPLEX PLUS) TABS Take 1 tablet by mouth daily. 100 tablet 3  . Cholecalciferol (VITAMIN D3) 1.25 MG (50000 UT) CAPS TAKE 1 CAPSULE BY MOUTH ONE TIME PER WEEK 8 capsule 0  . Cholecalciferol (VITAMIN D3) 50 MCG (2000 UT) capsule Take 1 capsule (2,000 Units total) by mouth daily. 100 capsule 3  . cycloSPORINE (RESTASIS) 0.05 % ophthalmic emulsion Place 1 drop into both eyes 2 (two) times daily.    . fluticasone (FLONASE) 50 MCG/ACT nasal spray     . mirtazapine (REMERON) 15 MG tablet TAKE 1 TABLET BY MOUTH EVERY DAY BEFORE OR WITH DINNER 90 tablet 2  . NONFORMULARY OR COMPOUNDED ITEM Topical anesthetic was sent to [facility]    . TRAVATAN Z 0.004 % SOLN ophthalmic solution Place 1 drop into both eyes daily.    Marland Kitchen triamcinolone ointment (KENALOG) 0.1 % Apply 1 application topically 2 (two) times daily. 80 g 2  . moxifloxacin (VIGAMOX) 0.5 % ophthalmic solution Place 1 drop into the right eye 4 (four) times daily.    . prednisoLONE acetate (PRED FORTE) 1 % ophthalmic suspension Place 1 drop into the right eye 4 (four) times daily.     No facility-administered medications prior to visit.    ROS: Review of Systems  Constitutional: Negative for appetite change, fatigue and unexpected weight change.  HENT: Negative for congestion, nosebleeds, sneezing, sore throat and trouble swallowing.   Eyes: Negative for itching and visual disturbance.  Respiratory: Negative for cough.   Cardiovascular: Negative for chest pain, palpitations and leg swelling.  Gastrointestinal: Negative for abdominal distention, blood in stool, diarrhea and nausea.  Genitourinary: Negative for  frequency and hematuria.  Musculoskeletal: Positive for gait problem. Negative for back pain, joint swelling and neck pain.  Skin: Positive for color change and rash.  Neurological: Negative for dizziness, tremors, speech difficulty and weakness.  Psychiatric/Behavioral: Negative for agitation, dysphoric mood and sleep disturbance. The patient is not nervous/anxious.     Objective:  BP (!) 150/78 (BP Location: Right Arm, Patient Position: Sitting, Cuff Size: Normal)   Pulse 64   Temp 98.4 F (36.9 C) (Oral)   Ht 5\' 8"  (1.727 m)   Wt 143 lb (64.9 kg)   SpO2 98%   BMI 21.74 kg/m   BP Readings from Last 3 Encounters:  10/07/19 (!) 150/78  08/04/19 (!) 154/76  04/28/19 (!) 148/86    Wt Readings from Last 3 Encounters:  10/07/19 143 lb (64.9 kg)  08/04/19 143 lb (64.9 kg)  05/14/19 147 lb (66.7 kg)    Physical Exam Constitutional:      General: He is not in acute distress.    Appearance: He is well-developed.     Comments: NAD  Eyes:     Conjunctiva/sclera: Conjunctivae normal.     Pupils: Pupils are equal, round, and reactive to light.  Neck:     Thyroid: No thyromegaly.     Vascular: No JVD.  Cardiovascular:     Rate and Rhythm: Normal rate and regular rhythm.     Heart sounds: Normal heart sounds. No murmur heard.  No friction rub. No gallop.   Pulmonary:     Effort: Pulmonary effort is normal. No respiratory distress.     Breath sounds: Normal breath sounds. No wheezing or rales.  Chest:     Chest wall: No tenderness.  Abdominal:     General: Bowel sounds are normal. There is no distension.     Palpations: Abdomen is soft. There is no mass.     Tenderness: There is no abdominal tenderness. There is no guarding or rebound.  Musculoskeletal:        General: No tenderness. Normal range of motion.     Cervical back: Normal range of motion.     Right lower leg: Edema present.     Left lower leg: Edema present.  Lymphadenopathy:     Cervical: No cervical  adenopathy.  Skin:    General: Skin is warm and dry.     Findings: Erythema and rash present.  Neurological:     Mental Status: He is alert and oriented to person, place, and time.     Cranial Nerves: No cranial nerve deficit.     Motor: No abnormal muscle tone.     Coordination: Coordination abnormal.     Gait: Gait abnormal.     Deep Tendon Reflexes: Reflexes are normal and symmetric.  Psychiatric:        Behavior: Behavior normal.        Thought Content: Thought content normal.        Judgment: Judgment normal.   B trace edema R>L RLE w/erythema B shin hyperpigmentation  Lab Results  Component Value Date   WBC 7.0 08/04/2019   HGB 12.2 (L) 08/04/2019   HCT 37.4 (L) 08/04/2019   PLT 202.0 08/04/2019   GLUCOSE 58 (L) 08/04/2019   CHOL 273 (H) 04/28/2019   TRIG 54.0 04/28/2019   HDL 70.10 04/28/2019   LDLDIRECT 136.2 09/28/2009   LDLCALC 192 (H) 04/28/2019   ALT 10 04/28/2019   AST 22 04/28/2019   NA 140 08/04/2019   K 4.3 08/04/2019   CL 105 08/04/2019   CREATININE 1.01 08/04/2019   BUN 21 08/04/2019   CO2 28 08/04/2019   TSH 1.29 08/04/2019   PSA 0.37 07/23/2017   INR 1.1 ratio (H) 10/06/2009   HGBA1C 5.6 12/09/2013    DG Foot Complete Left  Result Date: 10/29/2018 CLINICAL DATA:  Second toe open wound EXAM: LEFT FOOT - COMPLETE 3+ VIEW COMPARISON:  None. FINDINGS: No acute fracture or dislocation is noted. Mild tarsal degenerative changes are seen. Calcaneal spurring is noted. No definitive bony erosive changes are identified to suggest osteomyelitis. IMPRESSION: Mild degenerative change.  No findings to suggest osteomyelitis. Electronically Signed   By: Inez Catalina M.D.   On: 10/29/2018 15:45    Assessment & Plan:   There are no diagnoses linked to this encounter.   No orders of the defined types were placed in this encounter.    Follow-up: No follow-ups on file.  Walker Kehr, MD

## 2019-10-07 NOTE — Assessment & Plan Note (Signed)
No CP 

## 2019-10-20 ENCOUNTER — Ambulatory Visit: Payer: PRIVATE HEALTH INSURANCE | Admitting: Podiatry

## 2019-11-03 ENCOUNTER — Ambulatory Visit: Payer: Medicare Other | Admitting: Internal Medicine

## 2019-11-18 ENCOUNTER — Other Ambulatory Visit: Payer: Self-pay

## 2019-11-18 ENCOUNTER — Ambulatory Visit (INDEPENDENT_AMBULATORY_CARE_PROVIDER_SITE_OTHER): Payer: Medicare Other | Admitting: Internal Medicine

## 2019-11-18 ENCOUNTER — Encounter: Payer: Self-pay | Admitting: Internal Medicine

## 2019-11-18 DIAGNOSIS — R32 Unspecified urinary incontinence: Secondary | ICD-10-CM | POA: Insufficient documentation

## 2019-11-18 DIAGNOSIS — G301 Alzheimer's disease with late onset: Secondary | ICD-10-CM

## 2019-11-18 DIAGNOSIS — R600 Localized edema: Secondary | ICD-10-CM

## 2019-11-18 DIAGNOSIS — R634 Abnormal weight loss: Secondary | ICD-10-CM | POA: Diagnosis not present

## 2019-11-18 DIAGNOSIS — I2510 Atherosclerotic heart disease of native coronary artery without angina pectoris: Secondary | ICD-10-CM

## 2019-11-18 DIAGNOSIS — F0280 Dementia in other diseases classified elsewhere without behavioral disturbance: Secondary | ICD-10-CM

## 2019-11-18 MED ORDER — DOXYCYCLINE HYCLATE 100 MG PO TABS: 100.0000 mg | ORAL_TABLET | Freq: Two times a day (BID) | ORAL | 0 refills | Status: DC

## 2019-11-18 NOTE — Assessment & Plan Note (Signed)
ACE wrap Doxy if redness is worse

## 2019-11-18 NOTE — Progress Notes (Signed)
Subjective:  Patient ID: [name] [name], male    DOB: 01-19-1933  Age: 86 y.o. MRN: [id number]  CC: No chief complaint on file.   HPI [name] [name] presents for    Better w/Daycare   Outpatient Medications Prior to Visit  Medication Sig Dispense Refill  . B Complex-Folic Acid (B COMPLEX PLUS) TABS Take 1 tablet by mouth daily. 100 tablet 3  . Cholecalciferol (VITAMIN D3) 50 MCG (2000 UT) capsule Take 1 capsule (2,000 Units total) by mouth daily. 100 capsule 3  . cycloSPORINE (RESTASIS) 0.05 % ophthalmic emulsion Place 1 drop into both eyes 2 (two) times daily.    Marland Kitchen doxycycline (VIBRA-TABS) 100 MG tablet Take 1 tablet (100 mg total) by mouth 2 (two) times daily. 20 tablet 0  . fluticasone (FLONASE) 50 MCG/ACT nasal spray     . mirtazapine (REMERON) 15 MG tablet TAKE 1 TABLET BY MOUTH EVERY DAY BEFORE OR WITH DINNER 90 tablet 2  . moxifloxacin (VIGAMOX) 0.5 % ophthalmic solution Place 1 drop into the right eye 4 (four) times daily.    . NONFORMULARY OR COMPOUNDED ITEM Topical anesthetic was sent to [facility]    . prednisoLONE acetate (PRED FORTE) 1 % ophthalmic suspension Place 1 drop into the right eye 4 (four) times daily.    . TRAVATAN Z 0.004 % SOLN ophthalmic solution Place 1 drop into both eyes daily.    Marland Kitchen triamcinolone ointment (KENALOG) 0.1 % Apply 1 application topically 2 (two) times daily. 80 g 3   No facility-administered medications prior to visit.    ROS: Review of Systems  Constitutional: Positive for fatigue. Negative for appetite change and unexpected weight change.  HENT: Negative for congestion, nosebleeds, sneezing, sore throat and trouble swallowing.   Eyes: Negative for itching and visual disturbance.  Respiratory: Negative for cough.   Cardiovascular: Positive for leg swelling. Negative for chest pain and palpitations.  Gastrointestinal: Negative for abdominal distention, blood in stool, diarrhea and nausea.  Genitourinary: Negative for frequency  and hematuria.  Musculoskeletal: Positive for gait problem. Negative for back pain, joint swelling and neck pain.  Skin: Negative for rash.  Neurological: Negative for dizziness, tremors, speech difficulty and weakness.  Psychiatric/Behavioral: Positive for behavioral problems. Negative for agitation, dysphoric mood and sleep disturbance. The patient is not nervous/anxious.     Objective:  BP (!) 160/78 (BP Location: Right Arm, Patient Position: Sitting, Cuff Size: Normal)   Pulse 67   Temp 98.6 F (37 C) (Oral)   Ht 5\' 8"  (1.727 m)   Wt 148 lb (67.1 kg)   SpO2 99%   BMI 22.50 kg/m   BP Readings from Last 3 Encounters:  11/18/19 (!) 160/78  10/07/19 (!) 150/78  08/04/19 (!) 154/76    Wt Readings from Last 3 Encounters:  11/18/19 148 lb (67.1 kg)  10/07/19 143 lb (64.9 kg)  08/04/19 143 lb (64.9 kg)   He is in no acute distress.  Well-nourished.  Alert and cooperative.  He is here with his daughter. His lungs are clear, heart is regular.  HEENT-moist L>R LE swelling Mild redness - RLE Right leg erythema contours were outlined with a pen  Lab Results  Component Value Date   WBC 7.0 08/04/2019   HGB 12.2 (L) 08/04/2019   HCT 37.4 (L) 08/04/2019   PLT 202.0 08/04/2019   GLUCOSE 58 (L) 08/04/2019   CHOL 273 (H) 04/28/2019   TRIG 54.0 04/28/2019   HDL 70.10 04/28/2019   LDLDIRECT 136.2 09/28/2009  LDLCALC 192 (H) 04/28/2019   ALT 10 04/28/2019   AST 22 04/28/2019   NA 140 08/04/2019   K 4.3 08/04/2019   CL 105 08/04/2019   CREATININE 1.01 08/04/2019   BUN 21 08/04/2019   CO2 28 08/04/2019   TSH 1.29 08/04/2019   PSA 0.37 07/23/2017   INR 1.1 ratio (H) 10/06/2009   HGBA1C 5.6 12/09/2013    DG Foot Complete Left  Result Date: 10/29/2018 CLINICAL DATA:  Second toe open wound EXAM: LEFT FOOT - COMPLETE 3+ VIEW COMPARISON:  None. FINDINGS: No acute fracture or dislocation is noted. Mild tarsal degenerative changes are seen. Calcaneal spurring is noted. No  definitive bony erosive changes are identified to suggest osteomyelitis. IMPRESSION: Mild degenerative change.  No findings to suggest osteomyelitis. Electronically Signed   By: Inez Catalina M.D.   On: 10/29/2018 15:45    Assessment & Plan:    Walker Kehr, MD

## 2019-11-18 NOTE — Assessment & Plan Note (Signed)
Wt Readings from Last 3 Encounters:  11/18/19 148 lb (67.1 kg)  10/07/19 143 lb (64.9 kg)  08/04/19 143 lb (64.9 kg)

## 2019-11-18 NOTE — Assessment & Plan Note (Signed)
2 depends a day

## 2019-11-18 NOTE — Assessment & Plan Note (Signed)
Better since he is going to DayCare

## 2019-11-22 ENCOUNTER — Encounter: Payer: Self-pay | Admitting: Internal Medicine

## 2019-12-22 ENCOUNTER — Encounter: Payer: Self-pay | Admitting: Internal Medicine

## 2019-12-22 ENCOUNTER — Ambulatory Visit (INDEPENDENT_AMBULATORY_CARE_PROVIDER_SITE_OTHER): Payer: Medicare Other | Admitting: Internal Medicine

## 2019-12-22 ENCOUNTER — Other Ambulatory Visit: Payer: Self-pay

## 2019-12-22 DIAGNOSIS — J209 Acute bronchitis, unspecified: Secondary | ICD-10-CM

## 2019-12-22 DIAGNOSIS — I2510 Atherosclerotic heart disease of native coronary artery without angina pectoris: Secondary | ICD-10-CM | POA: Diagnosis not present

## 2019-12-22 MED ORDER — PROMETHAZINE-CODEINE 6.25-10 MG/5ML PO SYRP: 5.0000 mL | ORAL_SOLUTION | ORAL | 0 refills | Status: DC | PRN

## 2019-12-22 MED ORDER — DOXYCYCLINE HYCLATE 100 MG PO TABS: 100.0000 mg | ORAL_TABLET | Freq: Two times a day (BID) | ORAL | 0 refills | Status: DC

## 2019-12-22 NOTE — Assessment & Plan Note (Signed)
Acute Diffuse expiratory wheeze Concern for bacterial cause Doxycycline BID x 10 days Phenergan w/ codeine cough syrup prn - has done well with this in the past If no improvement over the next couple of days asked them to call and will start steroids

## 2019-12-22 NOTE — Progress Notes (Signed)
Subjective:    Patient ID: [name] [name], male    DOB: 09/02/1932, 86 y.o.   MRN: [id number]  HPI The patient is here for an acute visit. he is here with a family member.    Symptoms started 3 days ago.   She had a negative covid test on 9/5.  He states chest tightness, cough that is productive at times, SOB, wheeze and headaches. He denies fever, chills, sinus pain, nasal congestion.     This is common for him to get bronchitis at this time of year.   Medications and allergies reviewed with patient and updated if appropriate.  Patient Active Problem List   Diagnosis Date Noted  . Urinary incontinence 11/18/2019  . Failure to thrive in adult 08/04/2019  . Trochanteric bursitis of right hip 04/28/2019  . Cellulitis 04/28/2019  . Dementia ([place]) 05/12/2018  . Auditory hallucination 05/12/2018  . Right leg swelling 09/05/2017  . Epistaxis 01/16/2017  . Tinea pedis 06/29/2016  . Vertigo 02/13/2016  . MGUS (monoclonal gammopathy of unknown significance) 01/31/2016  . ARF (acute renal failure) ([place]) 12/29/2015  . Acute kidney injury ([place]) 12/23/2015  . Diarrhea 12/21/2015  . Asymptomatic gallstones 08/31/2015  . Weight loss 08/17/2015  . Edema 08/17/2015  . Serous otitis media 05/20/2015  . Sebaceous cyst of left axilla 10/15/2014  . Peripheral arterial disease ([place]) 12/29/2013  . Cerumen impaction 12/09/2013  . Pain in lower limb 08/17/2013  . Dry skin 06/10/2013  . Aortic valve stenosis 12/11/2012  . Allergic rhinitis 08/19/2012  . Onychomycosis 08/12/2012  . Pain in joint, ankle and foot 08/12/2012  . CTS (carpal tunnel syndrome) 05/13/2012  . Chronic venous insufficiency 05/13/2012  . Right bundle branch block 12/10/2011  . Pneumonia   . Cough 03/31/2010  . TOBACCO USE, QUIT 04/01/2009  . CORONARY ATHEROSCLEROSIS NATIVE CORONARY ARTERY 03/25/2009  . CAROTID ARTERY STENOSIS, WITHOUT INFARCTION 02/11/2009  . Abdominal pain, epigastric 11/17/2008  . PLEURAL  EFFUSION, RIGHT 02/03/2008  . DYSPNEA 02/03/2008  . FOOT PAIN 09/22/2007  . ABNORMAL LIVER FUNCTION TESTS 09/22/2007  . POSTHERPETIC NEURALGIA 07/17/2007  . LOW BACK PAIN 07/17/2007  . COLONIC POLYPS, HX OF 07/17/2007  . SHINGLES 07/03/2007  . Dyslipidemia 05/01/2007  . Benign hypertensive heart and renal disease with renal failure 05/01/2007  . MYALGIA 05/01/2007  . ABNORMAL GLUCOSE NEC 05/01/2007    Current Outpatient Medications on File Prior to Visit  Medication Sig Dispense Refill  . B Complex-Folic Acid (B COMPLEX PLUS) TABS Take 1 tablet by mouth daily. 100 tablet 3  . Cholecalciferol (VITAMIN D3) 50 MCG (2000 UT) capsule Take 1 capsule (2,000 Units total) by mouth daily. 100 capsule 3  . cycloSPORINE (RESTASIS) 0.05 % ophthalmic emulsion Place 1 drop into both eyes 2 (two) times daily.    Marland Kitchen doxycycline (VIBRA-TABS) 100 MG tablet Take 1 tablet (100 mg total) by mouth 2 (two) times daily. 20 tablet 0  . fluticasone (FLONASE) 50 MCG/ACT nasal spray     . mirtazapine (REMERON) 15 MG tablet TAKE 1 TABLET BY MOUTH EVERY DAY BEFORE OR WITH DINNER 90 tablet 2  . moxifloxacin (VIGAMOX) 0.5 % ophthalmic solution Place 1 drop into the right eye 4 (four) times daily.    . NONFORMULARY OR COMPOUNDED ITEM Topical anesthetic was sent to [facility]    . prednisoLONE acetate (PRED FORTE) 1 % ophthalmic suspension Place 1 drop into the right eye 4 (four) times daily.    . TRAVATAN Z 0.004 %  SOLN ophthalmic solution Place 1 drop into both eyes daily.    Marland Kitchen triamcinolone ointment (KENALOG) 0.1 % Apply 1 application topically 2 (two) times daily. 80 g 3   No current facility-administered medications on file prior to visit.    Past Medical History:  Diagnosis Date  . Anxiety   . Arthritis   . Carotid artery disease ([place])   . Coronary artery disease    sees Dr. Verl Blalock  . Depression   . DJD (degenerative joint disease)    right hip  . GERD (gastroesophageal reflux disease)    hx of    . Glaucoma   . Glucose intolerance (impaired glucose tolerance)   . Headache(784.0)    hx of migraines  . Herpes zoster 2009   r. scalp and opth  . Hx of colonic polyps 2004   hyperplastic  . Hyperlipidemia   . Hypertension    sees Dr. Adelene Amas  . Internal and external hemorrhoids without complication   . Low back pain    facet arthropathy  . Lumbago   . Peripheral vascular disease ([place])   . Pneumonia 2009   rll with a small effusion  . Postherpetic neuralgia 2009    Past Surgical History:  Procedure Laterality Date  . ACNE CYST REMOVAL  1970's   removed x2 off the back  . CARPAL TUNNEL RELEASE     left  . CATARACT EXTRACTION W/PHACO  02/13/2012   Procedure: CATARACT EXTRACTION PHACO AND INTRAOCULAR LENS PLACEMENT (IOC);  Surgeon: Adonis Brook, MD;  Location: [place];  Service: Ophthalmology;  Laterality: Left;  . COLONOSCOPY    . CORONARY ARTERY BYPASS GRAFT  2002  . REPOSITION OF LENS  03/14/2012   Procedure: REPOSITION OF LENS;  Surgeon: Adonis Brook, MD;  Location: [place];  Service: Ophthalmology;  Laterality: Left;  Reposition Ocular Lens Left Eye  . thumb tendon surgery     left  . TONSILLECTOMY     age 50    Social History   Socioeconomic History  . Marital status: Divorced    Spouse name: Not on file  . Number of children: 3  . Years of education: Not on file  . Highest education level: Not on file  Occupational History    Employer: RETIRED  Tobacco Use  . Smoking status: Former Smoker    Packs/day: 0.75    Years: 25.00    Pack years: 18.75  . Smokeless tobacco: Never Used  . Tobacco comment: quit at age 36  Vaping Use  . Vaping Use: Never used  Substance and Sexual Activity  . Alcohol use: Not Currently    Alcohol/week: 0.0 standard drinks    Comment: infrequent glass of wine  . Drug use: No  . Sexual activity: Not Currently  Other Topics Concern  . Not on file  Social History Narrative   Daughter lives with him      One story home       Right handed      Highest level of edu- 2 years of college   Social Determinants of Health   Financial Resource Strain:   . Difficulty of Paying Living Expenses: Not on file  Food Insecurity:   . Worried About Charity fundraiser in the Last Year: Not on file  . Ran Out of Food in the Last Year: Not on file  Transportation Needs:   . Lack of Transportation (Medical): Not on file  . Lack of Transportation (Non-Medical): Not on file  Physical Activity:   . Days of Exercise per Week: Not on file  . Minutes of Exercise per Session: Not on file  Stress:   . Feeling of Stress : Not on file  Social Connections:   . Frequency of Communication with Friends and Family: Not on file  . Frequency of Social Gatherings with Friends and Family: Not on file  . Attends Religious Services: Not on file  . Active Member of Clubs or Organizations: Not on file  . Attends Archivist Meetings: Not on file  . Marital Status: Not on file    Family History  Problem Relation Age of Onset  . Breast cancer Mother   . Pulmonary embolism Father   . Colon cancer Neg Hx   . Esophageal cancer Neg Hx     Review of Systems  Constitutional: Negative for appetite change, chills, fatigue and fever.  HENT: Negative for congestion, sinus pressure, sinus pain and sore throat.   Respiratory: Positive for cough (cough is productive), chest tightness, shortness of breath (a little) and wheezing.   Gastrointestinal: Negative for diarrhea and nausea.  Neurological: Positive for headaches (little).       Objective:   Vitals:   12/22/19 1609  BP: (!) 142/68  Pulse: 75  Temp: 98.5 F (36.9 C)  SpO2: 95%   BP Readings from Last 3 Encounters:  12/22/19 (!) 142/68  11/18/19 (!) 160/78  10/07/19 (!) 150/78   Wt Readings from Last 3 Encounters:  12/22/19 144 lb (65.3 kg)  11/18/19 148 lb (67.1 kg)  10/07/19 143 lb (64.9 kg)   Body mass index is 21.9 kg/m.   Physical Exam Constitutional:       General: He is not in acute distress.    Appearance: Normal appearance. He is not ill-appearing.  HENT:     Head: Normocephalic and atraumatic.     Right Ear: Tympanic membrane, ear canal and external ear normal.     Left Ear: Tympanic membrane, ear canal and external ear normal.     Mouth/Throat:     Mouth: Mucous membranes are moist.     Pharynx: No oropharyngeal exudate or posterior oropharyngeal erythema.  Eyes:     Conjunctiva/sclera: Conjunctivae normal.  Cardiovascular:     Rate and Rhythm: Normal rate and regular rhythm.  Pulmonary:     Effort: Pulmonary effort is normal. No respiratory distress.     Breath sounds: Wheezing (difusse expiratory) present. No rales.  Skin:    General: Skin is warm and dry.  Neurological:     Mental Status: He is alert.            Assessment & Plan:    See Problem List for Assessment and Plan of chronic medical problems.    This visit occurred during the SARS-CoV-2 public health emergency.  Safety protocols were in place, including screening questions prior to the visit, additional usage of staff PPE, and extensive cleaning of exam room while observing appropriate contact time as indicated for disinfecting solutions.

## 2019-12-22 NOTE — Patient Instructions (Signed)
  Medications reviewed and updated.  Changes include :   Take the antibiotic as prescribed and use the cough syrup as needed.   Your prescription(s) have been submitted to your pharmacy. Please take as directed and contact our office if you believe you are having problem(s) with the medication(s).    Please call if there is no improvement in your symptoms.

## 2020-03-04 ENCOUNTER — Ambulatory Visit (INDEPENDENT_AMBULATORY_CARE_PROVIDER_SITE_OTHER): Payer: Medicare Other | Admitting: Family

## 2020-03-04 ENCOUNTER — Encounter: Payer: Self-pay | Admitting: Family

## 2020-03-04 ENCOUNTER — Ambulatory Visit (INDEPENDENT_AMBULATORY_CARE_PROVIDER_SITE_OTHER): Payer: Medicare Other

## 2020-03-04 ENCOUNTER — Other Ambulatory Visit: Payer: Self-pay

## 2020-03-04 VITALS — BP 130/58 | HR 86 | Temp 98.7°F | Ht 68.0 in | Wt 152.0 lb

## 2020-03-04 DIAGNOSIS — G8929 Other chronic pain: Secondary | ICD-10-CM

## 2020-03-04 DIAGNOSIS — M545 Low back pain, unspecified: Secondary | ICD-10-CM | POA: Diagnosis not present

## 2020-03-04 MED ORDER — PREDNISONE 20 MG PO TABS: 20.0000 mg | ORAL_TABLET | Freq: Every day | ORAL | 0 refills | Status: DC

## 2020-03-04 NOTE — Progress Notes (Signed)
[name] [name] is a 86 y.o. male with the following history as recorded in EpicCare:  Patient Active Problem List   Diagnosis Date Noted  . Acute bronchitis with bronchospasm 12/22/2019  . Urinary incontinence 11/18/2019  . Failure to thrive in adult 08/04/2019  . Trochanteric bursitis of right hip 04/28/2019  . Cellulitis 04/28/2019  . Dementia ([place]) 05/12/2018  . Auditory hallucination 05/12/2018  . Right leg swelling 09/05/2017  . Epistaxis 01/16/2017  . Tinea pedis 06/29/2016  . Vertigo 02/13/2016  . MGUS (monoclonal gammopathy of unknown significance) 01/31/2016  . ARF (acute renal failure) ([place]) 12/29/2015  . Acute kidney injury ([place]) 12/23/2015  . Diarrhea 12/21/2015  . Asymptomatic gallstones 08/31/2015  . Weight loss 08/17/2015  . Edema 08/17/2015  . Serous otitis media 05/20/2015  . Sebaceous cyst of left axilla 10/15/2014  . Peripheral arterial disease ([place]) 12/29/2013  . Cerumen impaction 12/09/2013  . Pain in lower limb 08/17/2013  . Dry skin 06/10/2013  . Aortic valve stenosis 12/11/2012  . Allergic rhinitis 08/19/2012  . Onychomycosis 08/12/2012  . Pain in joint, ankle and foot 08/12/2012  . CTS (carpal tunnel syndrome) 05/13/2012  . Chronic venous insufficiency 05/13/2012  . Right bundle branch block 12/10/2011  . Pneumonia   . Cough 03/31/2010  . TOBACCO USE, QUIT 04/01/2009  . CORONARY ATHEROSCLEROSIS NATIVE CORONARY ARTERY 03/25/2009  . CAROTID ARTERY STENOSIS, WITHOUT INFARCTION 02/11/2009  . Abdominal pain, epigastric 11/17/2008  . PLEURAL EFFUSION, RIGHT 02/03/2008  . DYSPNEA 02/03/2008  . FOOT PAIN 09/22/2007  . ABNORMAL LIVER FUNCTION TESTS 09/22/2007  . POSTHERPETIC NEURALGIA 07/17/2007  . LOW BACK PAIN 07/17/2007  . COLONIC POLYPS, HX OF 07/17/2007  . SHINGLES 07/03/2007  . Dyslipidemia 05/01/2007  . Benign hypertensive heart and renal disease with renal failure 05/01/2007  . MYALGIA 05/01/2007  . ABNORMAL GLUCOSE NEC 05/01/2007     Current Outpatient Medications  Medication Sig Dispense Refill  . B Complex-Folic Acid (B COMPLEX PLUS) TABS Take 1 tablet by mouth daily. 100 tablet 3  . Cholecalciferol (VITAMIN D3) 50 MCG (2000 UT) capsule Take 1 capsule (2,000 Units total) by mouth daily. 100 capsule 3  . cycloSPORINE (RESTASIS) 0.05 % ophthalmic emulsion Place 1 drop into both eyes 2 (two) times daily.    . fluticasone (FLONASE) 50 MCG/ACT nasal spray     . mirtazapine (REMERON) 15 MG tablet TAKE 1 TABLET BY MOUTH EVERY DAY BEFORE OR WITH DINNER 90 tablet 2  . moxifloxacin (VIGAMOX) 0.5 % ophthalmic solution Place 1 drop into the right eye 4 (four) times daily.    . NONFORMULARY OR COMPOUNDED ITEM Topical anesthetic was sent to [facility], [place]    . prednisoLONE acetate (PRED FORTE) 1 % ophthalmic suspension Place 1 drop into the right eye 4 (four) times daily.    . promethazine-codeine (PHENERGAN WITH CODEINE) 6.25-10 MG/5ML syrup Take 5 mLs by mouth every 4 (four) hours as needed. 180 mL 0  . TRAVATAN Z 0.004 % SOLN ophthalmic solution Place 1 drop into both eyes daily.    Marland Kitchen triamcinolone ointment (KENALOG) 0.1 % Apply 1 application topically 2 (two) times daily. 80 g 3  . predniSONE (DELTASONE) 20 MG tablet Take 1 tablet (20 mg total) by mouth daily with breakfast. 5 tablet 0   No current facility-administered medications for this visit.    Allergies: Atorvastatin, Benazepril hcl, Cosopt [dorzolamide hcl-timolol mal], Ezetimibe-simvastatin, Aricept [donepezil hcl], Norvasc [amlodipine besylate], and Zoloft [sertraline]  Past Medical History:  Diagnosis Date  . Anxiety   .  Arthritis   . Carotid artery disease ([place])   . Coronary artery disease    sees Dr. Verl Blalock  . Depression   . DJD (degenerative joint disease)    right hip  . GERD (gastroesophageal reflux disease)    hx of  . Glaucoma   . Glucose intolerance (impaired glucose tolerance)   . Headache(784.0)    hx of migraines  . Herpes zoster 2009   r.  scalp and opth  . Hx of colonic polyps 2004   hyperplastic  . Hyperlipidemia   . Hypertension    sees Dr. Adelene Amas  . Internal and external hemorrhoids without complication   . Low back pain    facet arthropathy  . Lumbago   . Peripheral vascular disease ([place])   . Pneumonia 2009   rll with a small effusion  . Postherpetic neuralgia 2009    Past Surgical History:  Procedure Laterality Date  . ACNE CYST REMOVAL  1970's   removed x2 off the back  . CARPAL TUNNEL RELEASE     left  . CATARACT EXTRACTION W/PHACO  02/13/2012   Procedure: CATARACT EXTRACTION PHACO AND INTRAOCULAR LENS PLACEMENT (IOC);  Surgeon: Adonis Brook, MD;  Location: [name];  Service: Ophthalmology;  Laterality: Left;  . COLONOSCOPY    . CORONARY ARTERY BYPASS GRAFT  2002  . REPOSITION OF LENS  03/14/2012   Procedure: REPOSITION OF LENS;  Surgeon: Adonis Brook, MD;  Location: [facility];  Service: Ophthalmology;  Laterality: Left;  Reposition Ocular Lens Left Eye  . thumb tendon surgery     left  . TONSILLECTOMY     age 89    Family History  Problem Relation Age of Onset  . Breast cancer Mother   . Pulmonary embolism Father   . Colon cancer Neg Hx   . Esophageal cancer Neg Hx     Social History   Tobacco Use  . Smoking status: Former Smoker    Packs/day: 0.75    Years: 25.00    Pack years: 18.75  . Smokeless tobacco: Never Used  . Tobacco comment: quit at age 61  Substance Use Topics  . Alcohol use: Not Currently    Alcohol/week: 0.0 standard drinks    Comment: infrequent glass of wine    Subjective:  Accompanied by daughter today; complaining of history of low back pain x 2 years; limited benefit with Tylenol; feels pain radiating around into right groin/ upper leg; would be interested in imaging today;      Objective:  Vitals:   03/04/20 1527  BP: (!) 130/58  Pulse: 86  Temp: 98.7 F (37.1 C)  TempSrc: Oral  SpO2: 99%  Weight: 152 lb (68.9 kg)  Height: 5\' 8"  (1.727 m)    General: Well  developed, well nourished, in no acute distress  Skin : Warm and dry.  Head: Normocephalic and atraumatic  Lungs: Respirations unlabored;  Musculoskeletal: No deformities; no active joint inflammation  Extremities: No edema, cyanosis, clubbing  Vessels: Symmetric bilaterally  Neurologic: Alert and oriented; speech intact; face symmetrical; moves all extremities well; CNII-XII intact without focal deficit   Assessment:  1. Chronic bilateral low back pain without sciatica     Plan:  Will update lumbar X-ray; may need to consider follow up with sports medicine; Rx for Prednisone 20 mg qd x 5 days;   This visit occurred during the SARS-CoV-2 public health emergency.  Safety protocols were in place, including screening questions prior to the visit, additional usage of  staff PPE, and extensive cleaning of exam room while observing appropriate contact time as indicated for disinfecting solutions.     No follow-ups on file.  Orders Placed This Encounter  Procedures  . DG Lumbar Spine Complete    Standing Status:   Future    Number of Occurrences:   1    Standing Expiration Date:   03/04/2021    Order Specific Question:   Reason for Exam (SYMPTOM  OR DIAGNOSIS REQUIRED)    Answer:   low back pain    Order Specific Question:   Preferred imaging location?    Answer:   [facility]    Requested Prescriptions   Signed Prescriptions Disp Refills  . predniSONE (DELTASONE) 20 MG tablet 5 tablet 0    Sig: Take 1 tablet (20 mg total) by mouth daily with breakfast.

## 2020-03-07 ENCOUNTER — Encounter: Payer: Self-pay | Admitting: Family

## 2020-03-08 NOTE — Telephone Encounter (Signed)
Left message for patient's daughter to call back to schedule. 

## 2020-03-23 NOTE — Progress Notes (Signed)
Subjective:   I, [name] [name], LAT, ATC acting as a scribe for [name], MD.  I'm seeing this patient as a consultation for [name] Salvage, FNP. Note will be routed back to referring provider/PCP.  CC: Chronic low back pain  HPI: Pt is a 86yo male c/o LBP that's been ongoing for approximately 2 years. Pt was seen by PCP on 03/04/20 and was prescribed a 5 day dose of 20mg  prednisone. Pt locates pain to R trapz and R side low back that goes down his leg Radiating pn LE numbness/tingling: yes LE weakness: no Aggravates: walking Rx tried: no  Diagnostic testing: 03/04/20 L-spine XR  Past medical history, Surgical history, Family history, Social history, Allergies, and medications have been entered into the medical record, reviewed. Significant for dementia  Review of Systems: No new headache, visual changes, nausea, vomiting, diarrhea, constipation, dizziness, abdominal pain, skin rash, fevers, chills, night sweats, weight loss, swollen lymph nodes, body aches, joint swelling, muscle aches, chest pain, shortness of breath, mood changes, visual or auditory hallucinations.   Objective:    Vitals:   03/24/20 1507  BP: 108/70  Pulse: 73  SpO2: 99%   General: Well Developed, well nourished, and in no acute distress.  Neuro/Psych: Alert and oriented x3, extra-ocular muscles intact, able to move all 4 extremities, sensation grossly intact. Skin: Warm and dry, no rashes noted.  Respiratory: Not using accessory muscles, speaking in full sentences, trachea midline.  Cardiovascular: Pulses palpable, no extremity edema. Abdomen: Does not appear distended. MSK: C-spine normal. Nontender cervical midline. Normal cervical motion. Right trapezius tender palpation.  Normal shoulder motion pain with abduction. Shoulder strength intact.  L-spine nontender. Lower extremity strength is intact. Sensation is intact distally.  Lab and Radiology Results DG Lumbar Spine  Complete  Result Date: 03/07/2020 CLINICAL DATA:  Low back pain EXAM: LUMBAR SPINE - COMPLETE 4+ VIEW COMPARISON:  December 07, 2005 FINDINGS: Frontal, lateral, spot lumbosacral lateral, and bilateral oblique views were obtained. There are 5 non-rib-bearing lumbar type vertebral bodies. There is thoracolumbar levoscoliosis. There is no fracture or spondylolisthesis. There is moderate disc space narrowing at all levels. There is facet osteoarthritic change at L3-4, L4-5, and L5-S1 bilaterally. There is aortic atherosclerosis. IMPRESSION: Scoliosis with multilevel osteoarthritic change. No fracture or spondylolisthesis. Aortic Atherosclerosis (ICD10-I70.0). Electronically Signed   By: Lowella Grip III M.D.   On: 03/07/2020 09:30   I, [name], personally (independently) visualized and performed the interpretation of the images attached in this note.   Impression and Recommendations:    Assessment and Plan: 86 y.o. male with right lumbar radiculopathy at L5 dermatomal pattern.  This has been ongoing for months and slightly worsening.  Discussed options.  Plan for MRI to further characterize cause of radicular pain and for epidural steroid injection planning.  Recheck following MRI.  Very limited low-dose gabapentin.  I am fearful that higher doses of gabapentin will worsen confusion.  Right trapezius pain and dysfunction.  Likely muscle spasm and dysfunction.  Possibly C-spine referred pain due to presumed DDD.  Plan for home health PT.Marland Kitchen  PDMP not reviewed this encounter. Orders Placed This Encounter  Procedures  . MR Lumbar Spine Wo Contrast    Standing Status:   Future    Standing Expiration Date:   03/24/2021    Order Specific Question:   What is the patient's sedation requirement?    Answer:   No Sedation    Order Specific Question:   Does the patient have a pacemaker  or implanted devices?    Answer:   No    Order Specific Question:   Preferred imaging location?    Answer:   [facility] (table limit-550lbs)  . Ambulatory referral to Home Health    Referral Priority:   Routine    Referral Type:   Home Health Care    Referral Reason:   Specialty Services Required    Requested Specialty:   [place]    Number of Visits Requested:   1   Meds ordered this encounter  Medications  . gabapentin (NEURONTIN) 100 MG capsule    Sig: Take 1-3 capsules (100-300 mg total) by mouth 3 (three) times daily as needed (nerve pain).    Dispense:  90 capsule    Refill:  3    Discussed warning signs or symptoms. Please see discharge instructions. Patient expresses understanding.   The above documentation has been reviewed and is accurate and complete [name], M.D.

## 2020-03-24 ENCOUNTER — Ambulatory Visit (INDEPENDENT_AMBULATORY_CARE_PROVIDER_SITE_OTHER): Payer: Medicare Other | Admitting: Family Medicine

## 2020-03-24 ENCOUNTER — Other Ambulatory Visit: Payer: Self-pay

## 2020-03-24 VITALS — BP 108/70 | HR 73 | Ht 68.0 in | Wt 148.6 lb

## 2020-03-24 DIAGNOSIS — I2510 Atherosclerotic heart disease of native coronary artery without angina pectoris: Secondary | ICD-10-CM | POA: Diagnosis not present

## 2020-03-24 DIAGNOSIS — M62838 Other muscle spasm: Secondary | ICD-10-CM

## 2020-03-24 DIAGNOSIS — M5416 Radiculopathy, lumbar region: Secondary | ICD-10-CM | POA: Diagnosis not present

## 2020-03-24 MED ORDER — GABAPENTIN 100 MG PO CAPS: 100.0000 mg | ORAL_CAPSULE | Freq: Three times a day (TID) | ORAL | 3 refills | Status: DC | PRN

## 2020-03-24 NOTE — Patient Instructions (Signed)
Thank you for coming in today.  Lets plan for home health PT.   You should hear from MRI scheduling within 1 week. If you do not hear please let me know.   Heating pad   TENS unit.   Gabapentin mostly at night.   Recheck following MRI.   TENS UNIT: This is helpful for muscle pain and spasm.   Search and Purchase a TENS 7000 2nd edition at  www.tenspros.com or [email or web address] It should be less than $30.     TENS unit instructions: Do not shower or bathe with the unit on . Turn the unit off before removing electrodes or batteries . If the electrodes lose stickiness add a drop of water to the electrodes after they are disconnected from the unit and place on plastic sheet. If you continued to have difficulty, call the TENS unit company to purchase more electrodes. . Do not apply lotion on the skin area prior to use. Make sure the skin is clean and dry as this will help prolong the life of the electrodes. . After use, always check skin for unusual red areas, rash or other skin difficulties. If there are any skin problems, does not apply electrodes to the same area. . Never remove the electrodes from the unit by pulling the wires. . Do not use the TENS unit or electrodes other than as directed. . Do not change electrode placement without consultating your therapist or physician. Marland Kitchen Keep 2 fingers with between each electrode. . Wear time ratio is 2:1, on to off times.    For example on for 30 minutes off for 15 minutes and then on for 30 minutes off for 15 minutes

## 2020-04-01 DIAGNOSIS — M7061 Trochanteric bursitis, right hip: Secondary | ICD-10-CM | POA: Diagnosis not present

## 2020-04-01 DIAGNOSIS — I739 Peripheral vascular disease, unspecified: Secondary | ICD-10-CM | POA: Diagnosis not present

## 2020-04-01 DIAGNOSIS — I4510 Unspecified right bundle-branch block: Secondary | ICD-10-CM | POA: Diagnosis not present

## 2020-04-01 DIAGNOSIS — F0390 Unspecified dementia without behavioral disturbance: Secondary | ICD-10-CM | POA: Diagnosis not present

## 2020-04-01 DIAGNOSIS — Z87891 Personal history of nicotine dependence: Secondary | ICD-10-CM | POA: Diagnosis not present

## 2020-04-01 DIAGNOSIS — M4316 Spondylolisthesis, lumbar region: Secondary | ICD-10-CM | POA: Diagnosis not present

## 2020-04-01 DIAGNOSIS — G5600 Carpal tunnel syndrome, unspecified upper limb: Secondary | ICD-10-CM | POA: Diagnosis not present

## 2020-04-01 DIAGNOSIS — J309 Allergic rhinitis, unspecified: Secondary | ICD-10-CM | POA: Diagnosis not present

## 2020-04-01 DIAGNOSIS — I350 Nonrheumatic aortic (valve) stenosis: Secondary | ICD-10-CM | POA: Diagnosis not present

## 2020-04-09 DIAGNOSIS — F0390 Unspecified dementia without behavioral disturbance: Secondary | ICD-10-CM | POA: Diagnosis not present

## 2020-04-09 DIAGNOSIS — D509 Iron deficiency anemia, unspecified: Secondary | ICD-10-CM | POA: Diagnosis not present

## 2020-04-09 DIAGNOSIS — I959 Hypotension, unspecified: Secondary | ICD-10-CM | POA: Diagnosis not present

## 2020-04-09 DIAGNOSIS — D649 Anemia, unspecified: Secondary | ICD-10-CM | POA: Diagnosis not present

## 2020-04-09 DIAGNOSIS — I259 Chronic ischemic heart disease, unspecified: Secondary | ICD-10-CM | POA: Diagnosis not present

## 2020-04-09 DIAGNOSIS — J9584 Transfusion-related acute lung injury (TRALI): Secondary | ICD-10-CM | POA: Diagnosis not present

## 2020-04-09 DIAGNOSIS — I480 Paroxysmal atrial fibrillation: Secondary | ICD-10-CM | POA: Diagnosis not present

## 2020-04-09 DIAGNOSIS — I21A1 Myocardial infarction type 2: Secondary | ICD-10-CM | POA: Diagnosis not present

## 2020-04-09 DIAGNOSIS — I350 Nonrheumatic aortic (valve) stenosis: Secondary | ICD-10-CM | POA: Diagnosis not present

## 2020-04-09 DIAGNOSIS — I2510 Atherosclerotic heart disease of native coronary artery without angina pectoris: Secondary | ICD-10-CM | POA: Diagnosis not present

## 2020-04-09 DIAGNOSIS — E870 Hyperosmolality and hypernatremia: Secondary | ICD-10-CM | POA: Diagnosis not present

## 2020-04-09 DIAGNOSIS — Z20822 Contact with and (suspected) exposure to covid-19: Secondary | ICD-10-CM | POA: Diagnosis not present

## 2020-04-09 DIAGNOSIS — J9601 Acute respiratory failure with hypoxia: Secondary | ICD-10-CM | POA: Diagnosis not present

## 2020-04-09 DIAGNOSIS — I4891 Unspecified atrial fibrillation: Secondary | ICD-10-CM | POA: Diagnosis not present

## 2020-04-09 DIAGNOSIS — Z951 Presence of aortocoronary bypass graft: Secondary | ICD-10-CM | POA: Diagnosis not present

## 2020-04-09 DIAGNOSIS — J189 Pneumonia, unspecified organism: Secondary | ICD-10-CM | POA: Diagnosis not present

## 2020-04-09 DIAGNOSIS — I4510 Unspecified right bundle-branch block: Secondary | ICD-10-CM | POA: Diagnosis not present

## 2020-04-10 DIAGNOSIS — F0390 Unspecified dementia without behavioral disturbance: Secondary | ICD-10-CM | POA: Diagnosis present

## 2020-04-10 DIAGNOSIS — J9584 Transfusion-related acute lung injury (TRALI): Secondary | ICD-10-CM | POA: Diagnosis not present

## 2020-04-10 DIAGNOSIS — I517 Cardiomegaly: Secondary | ICD-10-CM | POA: Diagnosis not present

## 2020-04-10 DIAGNOSIS — Z951 Presence of aortocoronary bypass graft: Secondary | ICD-10-CM | POA: Diagnosis not present

## 2020-04-10 DIAGNOSIS — I350 Nonrheumatic aortic (valve) stenosis: Secondary | ICD-10-CM | POA: Diagnosis present

## 2020-04-10 DIAGNOSIS — J189 Pneumonia, unspecified organism: Secondary | ICD-10-CM | POA: Diagnosis present

## 2020-04-10 DIAGNOSIS — J9601 Acute respiratory failure with hypoxia: Secondary | ICD-10-CM | POA: Diagnosis not present

## 2020-04-10 DIAGNOSIS — J90 Pleural effusion, not elsewhere classified: Secondary | ICD-10-CM | POA: Diagnosis not present

## 2020-04-10 DIAGNOSIS — R079 Chest pain, unspecified: Secondary | ICD-10-CM | POA: Diagnosis not present

## 2020-04-10 DIAGNOSIS — I4891 Unspecified atrial fibrillation: Secondary | ICD-10-CM | POA: Diagnosis not present

## 2020-04-10 DIAGNOSIS — I480 Paroxysmal atrial fibrillation: Secondary | ICD-10-CM | POA: Diagnosis present

## 2020-04-10 DIAGNOSIS — Z20822 Contact with and (suspected) exposure to covid-19: Secondary | ICD-10-CM | POA: Diagnosis not present

## 2020-04-10 DIAGNOSIS — I959 Hypotension, unspecified: Secondary | ICD-10-CM | POA: Diagnosis not present

## 2020-04-10 DIAGNOSIS — I21A1 Myocardial infarction type 2: Secondary | ICD-10-CM | POA: Diagnosis present

## 2020-04-10 DIAGNOSIS — R0602 Shortness of breath: Secondary | ICD-10-CM | POA: Diagnosis not present

## 2020-04-10 DIAGNOSIS — E870 Hyperosmolality and hypernatremia: Secondary | ICD-10-CM | POA: Diagnosis present

## 2020-04-10 DIAGNOSIS — D509 Iron deficiency anemia, unspecified: Secondary | ICD-10-CM | POA: Diagnosis not present

## 2020-04-10 DIAGNOSIS — I259 Chronic ischemic heart disease, unspecified: Secondary | ICD-10-CM | POA: Diagnosis not present

## 2020-04-10 DIAGNOSIS — I4510 Unspecified right bundle-branch block: Secondary | ICD-10-CM | POA: Diagnosis present

## 2020-04-10 DIAGNOSIS — D649 Anemia, unspecified: Secondary | ICD-10-CM | POA: Diagnosis not present

## 2020-04-10 DIAGNOSIS — I2510 Atherosclerotic heart disease of native coronary artery without angina pectoris: Secondary | ICD-10-CM | POA: Diagnosis present

## 2020-04-10 DIAGNOSIS — R011 Cardiac murmur, unspecified: Secondary | ICD-10-CM | POA: Diagnosis not present

## 2020-04-11 ENCOUNTER — Telehealth: Payer: Self-pay | Admitting: Internal Medicine

## 2020-04-11 DIAGNOSIS — I2510 Atherosclerotic heart disease of native coronary artery without angina pectoris: Secondary | ICD-10-CM | POA: Diagnosis not present

## 2020-04-11 DIAGNOSIS — I4891 Unspecified atrial fibrillation: Secondary | ICD-10-CM | POA: Diagnosis not present

## 2020-04-11 DIAGNOSIS — D509 Iron deficiency anemia, unspecified: Secondary | ICD-10-CM | POA: Diagnosis not present

## 2020-04-11 DIAGNOSIS — I480 Paroxysmal atrial fibrillation: Secondary | ICD-10-CM | POA: Diagnosis not present

## 2020-04-11 NOTE — Telephone Encounter (Signed)
Patients daughter called and was requesting a call back. She can be reached at [phone]. she said that her father is in a hospital out of town and had some questions.

## 2020-04-11 NOTE — Telephone Encounter (Signed)
Please call Dr. Ruthine Dose ref: Mr. Milliman  [phone]

## 2020-04-11 NOTE — Telephone Encounter (Signed)
Documents have been faxed.

## 2020-04-11 NOTE — Telephone Encounter (Signed)
Patient being seen at [facility]   Dr. Lucia Estelle  needs last blood work, last office note,  Patient is presenting with low hemoglobin, any recent GI work-up (colonoscopy, etc)  Dr. Lucia Estelle Fax: [phone]

## 2020-04-12 DIAGNOSIS — D509 Iron deficiency anemia, unspecified: Secondary | ICD-10-CM | POA: Diagnosis not present

## 2020-04-12 DIAGNOSIS — I4891 Unspecified atrial fibrillation: Secondary | ICD-10-CM | POA: Diagnosis not present

## 2020-04-12 DIAGNOSIS — I480 Paroxysmal atrial fibrillation: Secondary | ICD-10-CM | POA: Diagnosis not present

## 2020-04-12 DIAGNOSIS — I2510 Atherosclerotic heart disease of native coronary artery without angina pectoris: Secondary | ICD-10-CM | POA: Diagnosis not present

## 2020-04-19 ENCOUNTER — Encounter: Payer: Self-pay | Admitting: Internal Medicine

## 2020-04-19 ENCOUNTER — Ambulatory Visit (INDEPENDENT_AMBULATORY_CARE_PROVIDER_SITE_OTHER): Payer: Medicare Other | Admitting: Internal Medicine

## 2020-04-19 ENCOUNTER — Inpatient Hospital Stay: Payer: Medicare Other | Admitting: Internal Medicine

## 2020-04-19 ENCOUNTER — Other Ambulatory Visit: Payer: Self-pay

## 2020-04-19 DIAGNOSIS — I350 Nonrheumatic aortic (valve) stenosis: Secondary | ICD-10-CM

## 2020-04-19 DIAGNOSIS — F0280 Dementia in other diseases classified elsewhere without behavioral disturbance: Secondary | ICD-10-CM

## 2020-04-19 DIAGNOSIS — G301 Alzheimer's disease with late onset: Secondary | ICD-10-CM | POA: Diagnosis not present

## 2020-04-19 DIAGNOSIS — J189 Pneumonia, unspecified organism: Secondary | ICD-10-CM | POA: Diagnosis not present

## 2020-04-19 DIAGNOSIS — I739 Peripheral vascular disease, unspecified: Secondary | ICD-10-CM

## 2020-04-19 DIAGNOSIS — D509 Iron deficiency anemia, unspecified: Secondary | ICD-10-CM | POA: Diagnosis not present

## 2020-04-19 DIAGNOSIS — D649 Anemia, unspecified: Secondary | ICD-10-CM | POA: Insufficient documentation

## 2020-04-19 DIAGNOSIS — R634 Abnormal weight loss: Secondary | ICD-10-CM

## 2020-04-19 MED ORDER — PANTOPRAZOLE SODIUM 20 MG PO TBEC: 20.0000 mg | DELAYED_RELEASE_TABLET | Freq: Every day | ORAL | 3 refills | Status: DC

## 2020-04-19 MED ORDER — METOPROLOL SUCCINATE ER 25 MG PO TB24: 25.0000 mg | ORAL_TABLET | Freq: Two times a day (BID) | ORAL | 3 refills | Status: DC

## 2020-04-19 NOTE — Assessment & Plan Note (Addendum)
Worse due to current medical issues, recent hospitalization

## 2020-04-19 NOTE — Assessment & Plan Note (Signed)
Wt Readings from Last 3 Encounters:  04/19/20 144 lb 12.8 oz (65.7 kg)  03/24/20 148 lb 9.6 oz (67.4 kg)  03/04/20 152 lb (68.9 kg)

## 2020-04-19 NOTE — Progress Notes (Signed)
Subjective:  Patient ID: [name] [name], male    DOB: 02/19/1933  Age: 87 y.o. MRN: [id number]  CC: Follow-up (Hosp f/u)   HPI [name] [name] presents for a post-hosp f/u - he was in [place], [place] w/his other dtr.   He was treated in [facility] for ARF, severe anemia, CAP, arrhythmia (A fib) - he was shocked 3 times in the ambulance for a rapid A fif. He was very anemic - no overt GI source of bleeding was found... He has a heart  valve issue (severe AS) - seeing a cardiologist was recommended. C/o cognitive decline, weakness....    Outpatient Medications Prior to Visit  Medication Sig Dispense Refill  . mirtazapine (REMERON) 15 MG tablet TAKE 1 TABLET BY MOUTH EVERY DAY BEFORE OR WITH DINNER 90 tablet 2  . moxifloxacin (VIGAMOX) 0.5 % ophthalmic solution Place 1 drop into the right eye 4 (four) times daily.    . prednisoLONE acetate (PRED FORTE) 1 % ophthalmic suspension Place 1 drop into the right eye 4 (four) times daily.    . predniSONE (DELTASONE) 20 MG tablet Take 1 tablet (20 mg total) by mouth daily with breakfast. 5 tablet 0  . TRAVATAN Z 0.004 % SOLN ophthalmic solution Place 1 drop into both eyes daily.    . B Complex-Folic Acid (B COMPLEX PLUS) TABS Take 1 tablet by mouth daily. (Patient not taking: Reported on 04/19/2020) 100 tablet 3  . Cholecalciferol (VITAMIN D3) 50 MCG (2000 UT) capsule Take 1 capsule (2,000 Units total) by mouth daily. (Patient not taking: Reported on 04/19/2020) 100 capsule 3  . cycloSPORINE (RESTASIS) 0.05 % ophthalmic emulsion Place 1 drop into both eyes 2 (two) times daily. (Patient not taking: Reported on 04/19/2020)    . gabapentin (NEURONTIN) 100 MG capsule Take 1-3 capsules (100-300 mg total) by mouth 3 (three) times daily as needed (nerve pain). (Patient not taking: Reported on 04/19/2020) 90 capsule 3  . metoprolol succinate (TOPROL-XL) 25 MG 24 hr tablet Take 25 mg by mouth 2 (two) times daily.    . pantoprazole (PROTONIX) 20 MG tablet      . fluticasone (FLONASE) 50 MCG/ACT nasal spray  (Patient not taking: Reported on 04/19/2020)    . NONFORMULARY OR COMPOUNDED ITEM Topical anesthetic was sent to [facility] (Patient not taking: Reported on 04/19/2020)    . promethazine-codeine (PHENERGAN WITH CODEINE) 6.25-10 MG/5ML syrup Take 5 mLs by mouth every 4 (four) hours as needed. (Patient not taking: Reported on 04/19/2020) 180 mL 0  . triamcinolone ointment (KENALOG) 0.1 % Apply 1 application topically 2 (two) times daily. (Patient not taking: Reported on 04/19/2020) 80 g 3   No facility-administered medications prior to visit.    ROS: Review of Systems  Constitutional: Positive for fatigue. Negative for appetite change and unexpected weight change.  HENT: Negative for congestion, nosebleeds, sneezing, sore throat and trouble swallowing.   Eyes: Negative for itching and visual disturbance.  Respiratory: Positive for shortness of breath. Negative for cough and wheezing.   Cardiovascular: Negative for chest pain, palpitations and leg swelling.  Gastrointestinal: Negative for abdominal distention, blood in stool, diarrhea and nausea.  Genitourinary: Negative for frequency and hematuria.  Musculoskeletal: Negative for back pain, gait problem, joint swelling and neck pain.  Skin: Negative for rash.  Neurological: Positive for weakness. Negative for dizziness, tremors, speech difficulty and light-headedness.  Psychiatric/Behavioral: Positive for confusion and decreased concentration. Negative for agitation, dysphoric mood, sleep disturbance and suicidal ideas.  The patient is not nervous/anxious.     Objective:  BP 138/70 (BP Location: Left Arm)   Pulse (!) 58   Temp 98.2 F (36.8 C) (Oral)   Ht 5\' 8"  (1.727 m)   Wt 144 lb 12.8 oz (65.7 kg)   SpO2 99%   BMI 22.02 kg/m   BP Readings from Last 3 Encounters:  04/19/20 138/70  03/24/20 108/70  03/04/20 (!) 130/58    Wt Readings from Last 3 Encounters:  04/19/20 144 lb 12.8  oz (65.7 kg)  03/24/20 148 lb 9.6 oz (67.4 kg)  03/04/20 152 lb (68.9 kg)    Physical Exam Constitutional:      General: He is not in acute distress.    Appearance: He is well-developed.     Comments: NAD  HENT:     Mouth/Throat:     Mouth: Oropharynx is clear and moist.  Eyes:     Conjunctiva/sclera: Conjunctivae normal.     Pupils: Pupils are equal, round, and reactive to light.  Neck:     Thyroid: No thyromegaly.     Vascular: No JVD.  Cardiovascular:     Rate and Rhythm: Normal rate and regular rhythm.     Pulses: Intact distal pulses.     Heart sounds: Murmur heard.  No friction rub. No gallop.   Pulmonary:     Effort: Pulmonary effort is normal. No respiratory distress.     Breath sounds: Normal breath sounds. No wheezing or rales.  Chest:     Chest wall: No tenderness.  Abdominal:     General: Bowel sounds are normal. There is no distension.     Palpations: Abdomen is soft. There is no mass.     Tenderness: There is no abdominal tenderness. There is no guarding or rebound.  Musculoskeletal:        General: No tenderness or edema. Normal range of motion.     Cervical back: Normal range of motion.  Lymphadenopathy:     Cervical: No cervical adenopathy.  Skin:    General: Skin is warm and dry.     Findings: No rash.  Neurological:     Mental Status: He is alert.     Cranial Nerves: No cranial nerve deficit.     Motor: Weakness present. No abnormal muscle tone.     Coordination: He displays a negative Romberg sign. Coordination abnormal.     Gait: Gait abnormal.     Deep Tendon Reflexes: Reflexes are normal and symmetric.  Psychiatric:        Mood and Affect: Mood and affect normal.   thin Alert and cooperative Ataxic Below average facial animation, somewhat stiff.  No tremor 3/4 systolic murmur    A total time of >45 minutes was spent preparing to see the patient, reviewing tests, x-rays, operative reports and outside records.  Also, obtaining history  from his daughter Drue [facility]Candy on [facility] and performing comprehensive physical exam.  Additionally, counseling the patient regarding the above listed issues.   Finally, documenting clinical information in the health records, coordination of care, educating the patient. It is a complex case.  Lab Results  Component Value Date   WBC 7.0 08/04/2019   HGB 12.2 (L) 08/04/2019   HCT 37.4 (L) 08/04/2019   PLT 202.0 08/04/2019   GLUCOSE 58 (L) 08/04/2019   CHOL 273 (H) 04/28/2019   TRIG 54.0 04/28/2019   HDL 70.10 04/28/2019   LDLDIRECT 136.2 09/28/2009   LDLCALC 192 (H) 04/28/2019  ALT 10 04/28/2019   AST 22 04/28/2019   NA 140 08/04/2019   K 4.3 08/04/2019   CL 105 08/04/2019   CREATININE 1.01 08/04/2019   BUN 21 08/04/2019   CO2 28 08/04/2019   TSH 1.29 08/04/2019   PSA 0.37 07/23/2017   INR 1.1 ratio (H) 10/06/2009   HGBA1C 5.6 12/09/2013    DG Foot Complete Left  Result Date: 10/29/2018 CLINICAL DATA:  Second toe open wound EXAM: LEFT FOOT - COMPLETE 3+ VIEW COMPARISON:  None. FINDINGS: No acute fracture or dislocation is noted. Mild tarsal degenerative changes are seen. Calcaneal spurring is noted. No definitive bony erosive changes are identified to suggest osteomyelitis. IMPRESSION: Mild degenerative change.  No findings to suggest osteomyelitis. Electronically Signed   By: Inez Catalina M.D.   On: 10/29/2018 15:45    Assessment & Plan:    Walker Kehr, MD

## 2020-04-19 NOTE — Assessment & Plan Note (Addendum)
Worse - severe AS w/good EF per report 12/21 Ref to Dr Eden Emms

## 2020-04-19 NOTE — Assessment & Plan Note (Addendum)
Severe of ?source; no overt GI bleed- GI OP w/up was recommended; anticoagulation is considered. S/p 2 u RBC transfusion GI ref On Protonix

## 2020-04-19 NOTE — Assessment & Plan Note (Signed)
B multifocal pneumonia - he  finished Doxy

## 2020-04-19 NOTE — Assessment & Plan Note (Signed)
Crestor stopped, Zetia stopped, ASA - stopped,  Plavix - stopped

## 2020-04-20 ENCOUNTER — Encounter: Payer: Self-pay | Admitting: Internal Medicine

## 2020-04-20 LAB — CBC WITH DIFFERENTIAL/PLATELET
Basophils Absolute: 0.2 10*3/uL — ABNORMAL HIGH (ref 0.0–0.1)
Basophils Relative: 2.2 % (ref 0.0–3.0)
Eosinophils Absolute: 0.2 10*3/uL (ref 0.0–0.7)
Eosinophils Relative: 3.3 % (ref 0.0–5.0)
HCT: 35.8 % — ABNORMAL LOW (ref 39.0–52.0)
Hemoglobin: 10.7 g/dL — ABNORMAL LOW (ref 13.0–17.0)
Lymphocytes Relative: 19.4 % (ref 12.0–46.0)
Lymphs Abs: 1.4 10*3/uL (ref 0.7–4.0)
MCHC: 30 g/dL (ref 30.0–36.0)
MCV: 70.2 fl — ABNORMAL LOW (ref 78.0–100.0)
Monocytes Absolute: 0.6 10*3/uL (ref 0.1–1.0)
Monocytes Relative: 7.9 % (ref 3.0–12.0)
Neutro Abs: 4.8 10*3/uL (ref 1.4–7.7)
Neutrophils Relative %: 67.2 % (ref 43.0–77.0)
Platelets: 221 10*3/uL (ref 150.0–400.0)
RBC: 5.1 Mil/uL (ref 4.22–5.81)
RDW: 30.8 % — ABNORMAL HIGH (ref 11.5–15.5)
WBC: 7.2 10*3/uL (ref 4.0–10.5)

## 2020-04-20 LAB — COMPREHENSIVE METABOLIC PANEL
ALT: 12 U/L (ref 0–53)
AST: 23 U/L (ref 0–37)
Albumin: 3.9 g/dL (ref 3.5–5.2)
Alkaline Phosphatase: 86 U/L (ref 39–117)
BUN: 19 mg/dL (ref 6–23)
CO2: 24 mEq/L (ref 19–32)
Calcium: 9.2 mg/dL (ref 8.4–10.5)
Chloride: 109 mEq/L (ref 96–112)
Creatinine, Ser: 1.13 mg/dL (ref 0.40–1.50)
GFR: 59.32 mL/min — ABNORMAL LOW (ref >60.00)
Glucose, Bld: 70 mg/dL (ref 70–99)
Potassium: 4.4 mEq/L (ref 3.5–5.1)
Sodium: 140 mEq/L (ref 135–145)
Total Bilirubin: 0.7 mg/dL (ref 0.2–1.2)
Total Protein: 6.2 g/dL (ref 6.0–8.3)

## 2020-04-20 LAB — IRON,TIBC AND FERRITIN PANEL
%SAT: 37 % (calc) (ref 20–48)
Ferritin: 246 ng/mL (ref 24–380)
Iron: 140 ug/dL (ref 50–180)
TIBC: 374 mcg/dL (calc) (ref 250–425)

## 2020-04-20 NOTE — Progress Notes (Unsigned)
Cardiology Office Note:   Date:  04/21/2020  NAME:  [name] [name]    MRN: [id number] DOB:  01/08/1933   PCP:  Cassandria Anger, MD  Cardiologist:  No primary care provider on file.   Referring MD: Cassandria Anger, MD   Chief Complaint  Patient presents with  . New Patient (Initial Visit)   History of Present Illness:   [name] [name] is a 87 y.o. male with a hx of CAD, aortic stenosis, PAD, Carotid artery disease, HTN, dementia, HLD who is being seen today for the evaluation of aortic stenosis at the request of Plotnikov, Evie Lacks, MD. Had a history of CABG, PAD, aortic stenosis and has not followed with cardiology since 2016. Admitted [facility] in [place] 04/09/2020-04/13/2020 for PNA, anemia, Afib, NSTEMI. Found to have severe AS per report but no echo report. Converted back to NSR there. No anticoagulation due to anemia per reports. Awaiting GI evaluation.   He apparently was visiting his daughter who lives in [place] over the Christmas holiday.  Apparently while driving back from [place] he had chest pain and shortness of breath.  He was having such bad symptoms that EMS was called.  EMS found him to be in A. fib with RVR and he underwent a cardioversion x2 that was unsuccessful.  He was then taken to the emergency room in [place] and underwent a subsequent cardioversion with return to sinus rhythm.  He was found to be hypotensive and anemic.  He was admitted to the hospital for community-acquired pneumonia.  He completed antibiotics for this.  He was anemic.  They apparently did not pursue gastroenterology evaluation as he lives in [place].  He lives with another daughter in town.  His hemoglobin value on repeat by his primary care physician 2 days ago was 10.7.  He has had no further dark stools or bloody stools.  He was not placed on anticoagulation in [place] due to the anemia.  He has not seen a gastroenterologist yet and has been  referred.  Apparently he has had symptoms of chest pain and shortness of breath for the past 9 months.  He has not seen cardiology since 2016.  His EKG today demonstrates he has a normal sinus rhythm with PACs.  He has a history of CAD and PAD.  Also has carotid artery disease.  He also has dementia for 5 years per report of the daughter.  He apparently is able to bathe and dress himself.  He does require assistance with getting around town.  He does not use a walker.  He is frail on examination today.  Unclear why he has not seen a cardiologist in over 5 years.  He reports no chest pain.  He is also had some increased lower extremity edema.  This is not been evaluated.  We did go over the fact that he has severe aortic stenosis on murmur today.  He also is in congestive heart failure.  His course is rather difficult as he has an anemia that needs evaluation.  His anemia could be due to his aortic stenosis.  Given that his hemoglobin values are within range may be the bleed has stopped.  I did discuss with him options including TAVR evaluation versus pursuing hospice care.  They are interested in a TAVR evaluation.  The first step would be to repeat an echocardiogram.  They are in agreement to move forward with this.  Problem List  1. CAD s/p CABG -05/07/2000 -LIMA-LAD, radial artery graft to PDA, SVG-D1, SVG-OM1-2 2. PAD -L CFA stenosis  3. Carotid artery stenosis -RICA 123456 -LICA 123456 4. Dementia 5. Anemia 6. HTN 7. HLD -statin intolerance  8. Persistent Afib -dx 04/08/2020 -> [place] -unstable and DCCV x 3 -in NSR 04/21/2020 -AC deferred due to anemia and concerns for GI bleed   Past Medical History: Past Medical History:  Diagnosis Date  . Anxiety   . Aortic stenosis   . Arthritis   . Carotid artery disease ([place])   . Coronary artery disease    sees Dr. Verl Blalock  . Depression   . DJD (degenerative joint disease)    right hip  . GERD (gastroesophageal reflux disease)    hx of   . Glaucoma   . Glucose intolerance (impaired glucose tolerance)   . Headache(784.0)    hx of migraines  . Herpes zoster 2009   r. scalp and opth  . Hx of colonic polyps 2004   hyperplastic  . Hyperlipidemia   . Hypertension    sees Dr. Adelene Amas  . Internal and external hemorrhoids without complication   . Low back pain    facet arthropathy  . Lumbago   . Peripheral vascular disease ([place])   . Pneumonia 2009   rll with a small effusion  . Postherpetic neuralgia 2009    Past Surgical History: Past Surgical History:  Procedure Laterality Date  . ACNE CYST REMOVAL  1970's   removed x2 off the back  . CARPAL TUNNEL RELEASE     left  . CATARACT EXTRACTION W/PHACO  02/13/2012   Procedure: CATARACT EXTRACTION PHACO AND INTRAOCULAR LENS PLACEMENT (IOC);  Surgeon: Adonis Brook, MD;  Location: [facility];  Service: Ophthalmology;  Laterality: Left;  . COLONOSCOPY    . CORONARY ARTERY BYPASS GRAFT  2002  . REPOSITION OF LENS  03/14/2012   Procedure: REPOSITION OF LENS;  Surgeon: Adonis Brook, MD;  Location: [facility];  Service: Ophthalmology;  Laterality: Left;  Reposition Ocular Lens Left Eye  . thumb tendon surgery     left  . TONSILLECTOMY     age 36    Current Medications: Current Meds  Medication Sig  . B Complex-Folic Acid (B COMPLEX PLUS) TABS Take 1 tablet by mouth daily.  . Cholecalciferol (VITAMIN D3) 50 MCG (2000 UT) capsule Take 1 capsule (2,000 Units total) by mouth daily.  . cycloSPORINE (RESTASIS) 0.05 % ophthalmic emulsion Place 1 drop into both eyes 2 (two) times daily.  Marland Kitchen doxycycline (VIBRAMYCIN) 100 MG capsule Take 100 mg by mouth 2 (two) times daily.  . ferrous sulfate 325 (65 FE) MG tablet Take 325 mg by mouth daily with breakfast.  . furosemide (LASIX) 20 MG tablet Take 1 tablet (20 mg total) by mouth daily.  Marland Kitchen gabapentin (NEURONTIN) 100 MG capsule Take 100-300 mg by mouth 3 (three) times daily.  . metoprolol succinate (TOPROL-XL) 25 MG 24 hr tablet Take 1 tablet  (25 mg total) by mouth 2 (two) times daily.  Marland Kitchen moxifloxacin (VIGAMOX) 0.5 % ophthalmic solution Place 1 drop into the right eye 4 (four) times daily.  . pantoprazole (PROTONIX) 20 MG tablet Take 1 tablet (20 mg total) by mouth daily.  . prednisoLONE acetate (PRED FORTE) 1 % ophthalmic suspension Place 1 drop into the right eye 4 (four) times daily.  . TRAVATAN Z 0.004 % SOLN ophthalmic solution Place 1 drop into both eyes daily.     Allergies:  Atorvastatin, Benazepril hcl, Cosopt [dorzolamide hcl-timolol mal], Ezetimibe-simvastatin, Aricept [donepezil hcl], Norvasc [amlodipine besylate], and Zoloft [sertraline]   Social History: Social History   Socioeconomic History  . Marital status: Divorced    Spouse name: Not on file  . Number of children: 3  . Years of education: Not on file  . Highest education level: Not on file  Occupational History    Employer: RETIRED  Tobacco Use  . Smoking status: Former Smoker    Packs/day: 0.75    Years: 25.00    Pack years: 18.75  . Smokeless tobacco: Never Used  . Tobacco comment: quit at age 87  Vaping Use  . Vaping Use: Never used  Substance and Sexual Activity  . Alcohol use: Not Currently    Alcohol/week: 0.0 standard drinks    Comment: infrequent glass of wine  . Drug use: No  . Sexual activity: Not Currently  Other Topics Concern  . Not on file  Social History Narrative   Daughter lives with him      One story home      Right handed      Highest level of edu- 2 years of college   Social Determinants of Health   Financial Resource Strain: Not on file  Food Insecurity: Not on file  Transportation Needs: Not on file  Physical Activity: Not on file  Stress: Not on file  Social Connections: Not on file     Family History: The patient's family history includes Breast cancer in his mother; Pulmonary embolism in his father. There is no history of Colon cancer or Esophageal cancer.  ROS:   All other ROS reviewed and  negative. Pertinent positives noted in the HPI.     EKGs/Labs/Other Studies Reviewed:   The following studies were personally reviewed by me today:  EKG:  EKG is ordered today.  The ekg ordered today demonstrates sinus rhythm, PACs noted, right bundle branch block, LVH, and was personally reviewed by me.   Recent Labs: 08/04/2019: TSH 1.29 04/19/2020: ALT 12; BUN 19; Creatinine, Ser 1.13; Hemoglobin 10.7; Platelets 221.0; Potassium 4.4; Sodium 140   Recent Lipid Panel    Component Value Date/Time   CHOL 273 (H) 04/28/2019 1208   TRIG 54.0 04/28/2019 1208   HDL 70.10 04/28/2019 1208   CHOLHDL 4 04/28/2019 1208   VLDL 10.8 04/28/2019 1208   LDLCALC 192 (H) 04/28/2019 1208   LDLDIRECT 136.2 09/28/2009 0800   Physical Exam:   VS:  BP (!) 140/58 (BP Location: Left Arm, Patient Position: Sitting, Cuff Size: Normal)   Pulse 64   Ht 5\' 8"  (1.727 m)   Wt 148 lb (67.1 kg)   BMI 22.50 kg/m    Wt Readings from Last 3 Encounters:  04/21/20 148 lb (67.1 kg)  04/19/20 144 lb 12.8 oz (65.7 kg)  03/24/20 148 lb 9.6 oz (67.4 kg)    General: Well nourished, well developed, in no acute distress Head: Atraumatic, normal size  Eyes: PEERLA, EOMI  Neck: Supple, no JVD Endocrine: No thryomegaly Cardiac: Normal S1, S2; RRR; 4 out of 6 late peaking systolic ejection murmur, radiates into carotids Lungs: Clear to auscultation bilaterally, no wheezing, rhonchi or rales  Abd: Soft, nontender, no hepatomegaly  Ext: 2+ pitting edema Musculoskeletal: No deformities, BUE and BLE strength normal and equal Skin: Warm and dry, no rashes   Neuro: Alert and oriented to person, place, time, and situation, CNII-XII grossly intact, no focal deficits  Psych: Normal mood and affect  ASSESSMENT:   [name] [name] is a 87 y.o. male who presents for the following: 1. Nonrheumatic aortic valve stenosis   2. Severe aortic stenosis   3. Chronic diastolic heart failure ([place])   4. Coronary artery disease involving  coronary bypass graft of native heart without angina pectoris   5. PAD (peripheral artery disease) (HCC)   6. Carotid artery stenosis, asymptomatic, bilateral   7. Persistent atrial fibrillation ([place])    PLAN:   1. Nonrheumatic aortic valve stenosis 2. Severe aortic stenosis 3. Chronic diastolic heart failure (H[facility]) -Moderate aortic stenosis in 2016.  His murmur is consistent with severe aortic stenosis.  I do not have any of the echo reports from [place] but he clearly has severe aortic stenosis on examination.  He is also in congestive heart failure.  He has had symptoms of chest pain and shortness of breath for the last 9 months. -We will start him on Lasix 20 mg a day.  He needs a BNP test. -When he was admitted to the hospital in [place] he had elevated troponins which were likely related to severe aortic stenosis.  Likely also a bit diastolic heart failure related. -He needs a repeat echocardiogram here in our system.  We also spent a lengthy time discussing TAVR evaluation.  He will need to go through the evaluation to determine if he is a candidate.  He may not be a candidate given his significant PAD.  He is not a candidate for open heart surgery.  He does have dementia which will need to be carefully weighed in the evaluation.  We will need to sort this out.   -He also needs to see a gastroenterologist to determine if bleeding will still be an issue.  He is able to tolerate an aspirin 81 mg a day.  He clearly will need to have this resolved before proceeding with TAVR evaluation. -We will also go ahead and refer him to the structural heart clinic.  We can go ahead and get him set up for TAVR scans as well as heart catheterizations as well as likely repeat imaging of his carotid arteries.  He is okay to proceed with this.  The daughters are okay as well.  4. Coronary artery disease involving coronary bypass graft of native heart without angina pectoris -s/p CABG -05/07/2000 -LIMA-LAD,  radial artery graft to PDA, SVG-D1, SVG-OM1-2 -Statin intolerant. -Recent admission for A. fib with RVR and anemia.  Had type II myocardial infarction.  He will ultimately need a left heart catheterization as part of his TAVR work-up.  No chest pain today.  His symptoms are likely also related to severe aortic stenosis as discussed above.  5. PAD (peripheral artery disease) (HCC) 6. Carotid artery stenosis, asymptomatic, bilateral -L CFA stenosis  -RICA 123456 -LICA 123456 -He is tolerating aspirin 81 mg a day.  Issues with anemia as discussed above.  He is statin intolerant.  He will have evaluation of the lower extremity arterial system on his TAVR scans.  He likely will need repeat carotid ultrasounds. -No significant claudication symptoms. -No strokelike symptoms reported either.  7. Persistent atrial fibrillation ([place]) -Diagnosed with atrial fibrillation in [place].  Underwent cardioversion x3 emergently.  He is in sinus rhythm today with PACs.  We are holding anticoagulation until this anemia is sorted out.  We will need gastroenterology's input on safety of anticoagulation.  His anemia could be due to severe aortic stenosis.  Disposition: Return in about 3 months (around  07/20/2020).  Medication Adjustments/Labs and Tests Ordered: Current medicines are reviewed at length with the patient today.  Concerns regarding medicines are outlined above.  Orders Placed This Encounter  Procedures  . Brain natriuretic peptide  . Ambulatory referral to Structural Heart/Valve Clinic (only at [facility])  . EKG 12-Lead  . ECHOCARDIOGRAM COMPLETE   Meds ordered this encounter  Medications  . furosemide (LASIX) 20 MG tablet    Sig: Take 1 tablet (20 mg total) by mouth daily.    Dispense:  90 tablet    Refill:  3    Patient Instructions  Medication Instructions:   START Lasix 20 mg daily  *If you need a refill on your cardiac medications before your next appointment, please call your  pharmacy*  Lab Work: Your physician recommends that you return for lab work TODAY:   BNP  If you have labs (blood work) drawn today and your tests are completely normal, you will receive your results only by: Marland Kitchen MyChart Message (if you have MyChart) OR . A paper copy in the mail If you have any lab test that is abnormal or we need to change your treatment, we will call you to review the results.  Testing/Procedures: Your physician has requested that you have an echocardiogram. Echocardiography is a painless test that uses sound waves to create images of your heart. It provides your doctor with information about the size and shape of your heart and how well your heart's chambers and valves are working. This procedure takes approximately one hour. There are no restrictions for this procedure.   STAT please schedule for within the couple of days     You have been referred to Structural Heart/Valve Clinic  Follow-Up: At [facility], you and your health needs are our priority.  As part of our continuing mission to provide you with exceptional heart care, we have created designated Provider Care Teams.  These Care Teams include your primary Cardiologist (physician) and Advanced Practice Providers (APPs -  Physician Assistants and Nurse Practitioners) who all work together to provide you with the care you need, when you need it.  Your next appointment:   3 month(s)  The format for your next appointment:   In Person  Provider:   Lennie Odor, MD  Other Instructions      Signed, Lenna Gilford. Flora Lipps, MD [facility]  [address], [address] [place], [place] [address] [phone]  04/21/2020 1:38 PM

## 2020-04-21 ENCOUNTER — Ambulatory Visit (INDEPENDENT_AMBULATORY_CARE_PROVIDER_SITE_OTHER): Payer: Medicare Other | Admitting: Cardiovascular Disease

## 2020-04-21 ENCOUNTER — Other Ambulatory Visit: Payer: Self-pay

## 2020-04-21 ENCOUNTER — Inpatient Hospital Stay: Admission: RE | Admit: 2020-04-21 | Payer: Medicare Other | Source: Ambulatory Visit

## 2020-04-21 ENCOUNTER — Encounter: Payer: Self-pay | Admitting: Cardiovascular Disease

## 2020-04-21 VITALS — BP 140/58 | HR 64 | Ht 68.0 in | Wt 148.0 lb

## 2020-04-21 DIAGNOSIS — I350 Nonrheumatic aortic (valve) stenosis: Secondary | ICD-10-CM

## 2020-04-21 DIAGNOSIS — I4819 Other persistent atrial fibrillation: Secondary | ICD-10-CM

## 2020-04-21 DIAGNOSIS — I6523 Occlusion and stenosis of bilateral carotid arteries: Secondary | ICD-10-CM | POA: Diagnosis not present

## 2020-04-21 DIAGNOSIS — I25810 Atherosclerosis of coronary artery bypass graft(s) without angina pectoris: Secondary | ICD-10-CM

## 2020-04-21 DIAGNOSIS — I5032 Chronic diastolic (congestive) heart failure: Secondary | ICD-10-CM

## 2020-04-21 DIAGNOSIS — I739 Peripheral vascular disease, unspecified: Secondary | ICD-10-CM

## 2020-04-21 MED ORDER — FUROSEMIDE 20 MG PO TABS: 20.0000 mg | ORAL_TABLET | Freq: Every day | ORAL | 3 refills | Status: DC

## 2020-04-21 NOTE — Patient Instructions (Signed)
Medication Instructions:   START Lasix 20 mg daily  *If you need a refill on your cardiac medications before your next appointment, please call your pharmacy*  Lab Work: Your physician recommends that you return for lab work TODAY:   BNP  If you have labs (blood work) drawn today and your tests are completely normal, you will receive your results only by: Marland Kitchen MyChart Message (if you have MyChart) OR . A paper copy in the mail If you have any lab test that is abnormal or we need to change your treatment, we will call you to review the results.  Testing/Procedures: Your physician has requested that you have an echocardiogram. Echocardiography is a painless test that uses sound waves to create images of your heart. It provides your doctor with information about the size and shape of your heart and how well your heart's chambers and valves are working. This procedure takes approximately one hour. There are no restrictions for this procedure.   STAT please schedule for within the couple of days     You have been referred to Structural Heart/Valve Clinic  Follow-Up: At [facility], you and your health needs are our priority.  As part of our continuing mission to provide you with exceptional heart care, we have created designated Provider Care Teams.  These Care Teams include your primary Cardiologist (physician) and Advanced Practice Providers (APPs -  Physician Assistants and Nurse Practitioners) who all work together to provide you with the care you need, when you need it.  Your next appointment:   3 month(s)  The format for your next appointment:   In Person  Provider:   Lennie Odor, MD  Other Instructions

## 2020-04-22 ENCOUNTER — Ambulatory Visit (INDEPENDENT_AMBULATORY_CARE_PROVIDER_SITE_OTHER): Payer: Medicare Other | Admitting: Gastroenterology

## 2020-04-22 ENCOUNTER — Ambulatory Visit (HOSPITAL_COMMUNITY): Payer: Medicare Other | Attending: Cardiovascular Disease

## 2020-04-22 ENCOUNTER — Encounter: Payer: Self-pay | Admitting: Gastroenterology

## 2020-04-22 VITALS — BP 104/50 | HR 54 | Ht 68.0 in | Wt 147.2 lb

## 2020-04-22 DIAGNOSIS — D649 Anemia, unspecified: Secondary | ICD-10-CM

## 2020-04-22 DIAGNOSIS — R634 Abnormal weight loss: Secondary | ICD-10-CM

## 2020-04-22 DIAGNOSIS — I350 Nonrheumatic aortic (valve) stenosis: Secondary | ICD-10-CM | POA: Diagnosis present

## 2020-04-22 DIAGNOSIS — I6523 Occlusion and stenosis of bilateral carotid arteries: Secondary | ICD-10-CM | POA: Diagnosis not present

## 2020-04-22 LAB — ECHOCARDIOGRAM COMPLETE
AR max vel: 0.84 cm2
AV Area VTI: 1.09 cm2
AV Area mean vel: 1.04 cm2
AV Mean grad: 43 mmHg
AV Peak grad: 85.4 mmHg
Ao pk vel: 4.62 m/s
Area-P 1/2: 2.32 cm2
Height: 68 in
S' Lateral: 2.8 cm
Weight: 2356 oz

## 2020-04-22 LAB — BRAIN NATRIURETIC PEPTIDE: BNP: 635 pg/mL — ABNORMAL HIGH (ref 0.0–100.0)

## 2020-04-22 NOTE — Progress Notes (Signed)
04/22/20[name] [id number] 1933-01-06   HISTORY OF PRESENT ILLNESS: This is a pleasant 87 year old male who was last seen here in 2017 for anemia and weight loss.  At that time he was scheduled for an EGD and colonoscopy.  This procedures were then canceled by his family because apparently the patient was in the hospital and they were never rescheduled or performed.  He is now here today with the same issues.  He is here with his daughter who provides all the information.  The patient dozes off several times during her visit.  His hemoglobin over the last few years of seem to be holding between the 11 and 12 g range.  She tells me that he was visiting his other daughter in [place], [place] and he was rushed to the emergency room and found to have a hemoglobin in the 6 g range.  She tells me that he received 2 units of packed red blood cells.  Hemoglobin earlier this week was 10.7 g.  She denies any signs of gastrointestinal bleeding.  She says that they look at his bowel movements and there has been no black or bloody stools.  She says that she believes they also tested his stool for blood while he was in the hospital and from what she knows it was negative.  It appears that he was also shocked 3 times in the ambulance on the way to the hospital for arrhythmia and was found to be been in A. fib with RVR.  He apparently has had some aortic stenosis for a long time, but they just did some more work-up and found out that it is quite severe.  He is on Plavix for several years as well.  He has had some cognitive decline.  She tells me that he never complains of abdominal pain.  He denies abdominal pain to me today.  She says his appetite is great.  His bowel movements are normal.  In regards to the weight loss, his daughter says his weight goes up and down.  He is currently 4 pounds heavier than he was when I saw him in September 2017.  She says that his appetite is great.   Past Medical History:   Diagnosis Date  . Anxiety   . Aortic stenosis   . Arthritis   . Carotid artery disease ([place])   . Coronary artery disease    sees Dr. Verl Blalock  . Depression   . DJD (degenerative joint disease)    right hip  . GERD (gastroesophageal reflux disease)    hx of  . Glaucoma   . Glucose intolerance (impaired glucose tolerance)   . Headache(784.0)    hx of migraines  . Herpes zoster 2009   r. scalp and opth  . Hx of colonic polyps 2004   hyperplastic  . Hyperlipidemia   . Hypertension    sees Dr. Adelene Amas  . Internal and external hemorrhoids without complication   . Low back pain    facet arthropathy  . Lumbago   . Peripheral vascular disease ([place])   . Pneumonia 2009   rll with a small effusion  . Postherpetic neuralgia 2009   Past Surgical History:  Procedure Laterality Date  . ACNE CYST REMOVAL  1970's   removed x2 off the back  . CARPAL TUNNEL RELEASE     left  . CATARACT EXTRACTION W/PHACO  02/13/2012   Procedure: CATARACT EXTRACTION PHACO AND INTRAOCULAR LENS PLACEMENT (IOC);  Surgeon:  Adonis Brook, MD;  Location: [place];  Service: Ophthalmology;  Laterality: Left;  . COLONOSCOPY    . CORONARY ARTERY BYPASS GRAFT  2002  . REPOSITION OF LENS  03/14/2012   Procedure: REPOSITION OF LENS;  Surgeon: Adonis Brook, MD;  Location: [facility];  Service: Ophthalmology;  Laterality: Left;  Reposition Ocular Lens Left Eye  . thumb tendon surgery     left  . TONSILLECTOMY     age 46    reports that he has quit smoking. He has a 18.75 pack-year smoking history. He has never used smokeless tobacco. He reports previous alcohol use. He reports that he does not use drugs. family history includes Breast cancer in his mother; Heart disease in his brother and sister; Pulmonary embolism in his father. Allergies  Allergen Reactions  . Atorvastatin Other (See Comments)    Muscle aches Muscle aches Muscle aches  . Benazepril Hcl Cough  . Cosopt [Dorzolamide Hcl-Timolol Mal] Other (See  Comments)    Eyes water  . Ezetimibe-Simvastatin Other (See Comments)    REACTION: aches REACTION: aches REACTION: aches  . Aricept [Donepezil Hcl]     hallucinations  . Norvasc [Amlodipine Besylate]     swelling  . Zoloft [Sertraline]       Outpatient Encounter Medications as of 04/22/2020  Medication Sig  . Cholecalciferol (VITAMIN D3) 50 MCG (2000 UT) capsule Take 1 capsule (2,000 Units total) by mouth daily.  Marland Kitchen doxycycline (VIBRAMYCIN) 100 MG capsule Take 100 mg by mouth 2 (two) times daily.  . ferrous sulfate 325 (65 FE) MG tablet Take 325 mg by mouth daily with breakfast.  . furosemide (LASIX) 20 MG tablet Take 1 tablet (20 mg total) by mouth daily.  . metoprolol succinate (TOPROL-XL) 25 MG 24 hr tablet Take 1 tablet (25 mg total) by mouth 2 (two) times daily.  . pantoprazole (PROTONIX) 20 MG tablet Take 1 tablet (20 mg total) by mouth daily.  Marland Kitchen gabapentin (NEURONTIN) 100 MG capsule Take 100-300 mg by mouth 3 (three) times daily.  . TRAVATAN Z 0.004 % SOLN ophthalmic solution Place 1 drop into both eyes daily.  . [DISCONTINUED] B Complex-Folic Acid (B COMPLEX PLUS) TABS Take 1 tablet by mouth daily. (Patient not taking: Reported on 04/22/2020)  . [DISCONTINUED] cycloSPORINE (RESTASIS) 0.05 % ophthalmic emulsion Place 1 drop into both eyes 2 (two) times daily. (Patient not taking: Reported on 04/22/2020)  . [DISCONTINUED] moxifloxacin (VIGAMOX) 0.5 % ophthalmic solution Place 1 drop into the right eye 4 (four) times daily. (Patient not taking: Reported on 04/22/2020)  . [DISCONTINUED] prednisoLONE acetate (PRED FORTE) 1 % ophthalmic suspension Place 1 drop into the right eye 4 (four) times daily. (Patient not taking: Reported on 04/22/2020)   No facility-administered encounter medications on file as of 04/22/2020.     REVIEW OF SYSTEMS  : All other systems reviewed and negative except where noted in the History of Present Illness.   PHYSICAL EXAM: BP (!) 104/50 (BP Location: Left Arm,  Patient Position: Sitting, Cuff Size: Normal)   Pulse (!) 54   Ht 5\' 8"  (1.727 m)   Wt 147 lb 4 oz (66.8 kg)   BMI 22.39 kg/m  General: Well developed AA male in no acute distress; dozes off most of the visit. Head: Normocephalic and atraumatic Eyes:  Sclerae anicteric, conjunctiva pink. Ears: Normal auditory acuity Lungs: Clear throughout to auscultation; no W/R/R. Heart: Regular rate and rhythm; no M/R/G. Abdomen: Soft, non-distended.  BS present.  Non-tender. Musculoskeletal: Symmetrical with  no gross deformities  Skin: No lesions on visible extremities Extremities: No edema  Neurological: Alert, sleepy, grossly non-focal  ASSESSMENT AND PLAN: *Anemia: This has been an ongoing issue for years.  He was seen here for the same things back in 2017 and scheduled for an EGD and colonoscopy at that time, but then those procedures were canceled and he never followed up.  It appears that his hemoglobin has remained stable in the 11-12 gram range over the past few years, but apparently he recently had a drop to 6 g range.  He received 2 units of packed red blood cells.  Now hemoglobin is up to 10.7 grams.  There has been no sign of overt GI bleeding per the family.  They even think that they checked a stool for blood while he was at the hospital for the blood transfusion and from what they not it was negative. *Weight loss: His weight is actually up 4 pounds compared to when we saw him back in September 2017.  **Had a long discussion with the daughter.  He is very high risk for procedures due to his age, cardiac issues (just had rapid a fib and also has severe AS), being on antiplatelet agent that would require being held, etc.  There has been no overt sign of GI bleeding.  Certainly if his blood counts went from his normal 11 to 12 g down to 6 g range there should have been some sign of overt GI bleeding seen.  She seemed very understanding and agreed to defer any procedures for now.  We will plan  for CT scan of the abdomen and pelvis with contrast to rule out any type of malignancy as he has not had any type of cross-sectional imaging of his abdomen and pelvis in several years that I can tell.  We will perform stool Hemoccult, but even if they are positive if CT scan looks okay then I will likely still recommend close monitoring of his blood counts and observation.   CC:  Plotnikov, Evie Lacks, MD

## 2020-04-22 NOTE — Patient Instructions (Signed)
If you are age 85 or older, your body mass index should be between 23-30. Your Body mass index is 22.39 kg/m. If this is out of the aforementioned range listed, please consider follow up with your Primary Care Provider.  If you are age 66 or younger, your body mass index should be between 19-25. Your Body mass index is 22.39 kg/m. If this is out of the aformentioned range listed, please consider follow up with your Primary Care Provider.   You have been scheduled for a CT scan of the abdomen and pelvis at [facility], 1st floor Radiology. You are scheduled on 05/02/20  at 8:00am. You should arrive 15 minutes prior to your appointment time for registration.  Please pick up 2 bottles of contrast from [place] at least 3 days prior to your scan. The solution may taste better if refrigerated, but do NOT add ice or any other liquid to this solution. Shake well before drinking.   Please follow the written instructions below on the day of your exam:   1) Do not eat anything after 4:00am (4 hours prior to your test)   2) Drink 1 bottle of contrast @ 6:00am (2 hours prior to your exam)  Remember to shake well before drinking and do NOT pour over ice.     Drink 1 bottle of contrast @ 7:00am (1 hour prior to your exam)   You may take any medications as prescribed with a small amount of water, if necessary. If you take any of the following medications: METFORMIN, GLUCOPHAGE, GLUCOVANCE, AVANDAMET, RIOMET, FORTAMET, [place] MET, JANUMET, GLUMETZA or METAGLIP, you MAY be asked to HOLD this medication 48 hours AFTER the exam.   The purpose of you drinking the oral contrast is to aid in the visualization of your intestinal tract. The contrast solution may cause some diarrhea. Depending on your individual set of symptoms, you may also receive an intravenous injection of x-ray contrast/dye. Plan on being at [facility] for 45 minutes or longer, depending on the type of exam you are having performed.    If you have any questions regarding your exam or if you need to reschedule, you may call [facility] at [phone] between the hours of 8:00 am and 5:00 pm, Monday-Friday.    Follow the instructions on the Hemoccult cards and mail them back to [place] when you are finished or you may take them directly to the lab in the basement of the [place] building. We will call you with the results.   It was a pleasure to see you today!  Alonza Bogus, PA-C

## 2020-04-25 ENCOUNTER — Encounter: Payer: Self-pay | Admitting: Cardiovascular Disease

## 2020-04-25 ENCOUNTER — Telehealth: Payer: Self-pay | Admitting: Licensed Clinical Social Worker

## 2020-04-25 ENCOUNTER — Ambulatory Visit (INDEPENDENT_AMBULATORY_CARE_PROVIDER_SITE_OTHER): Payer: Medicare Other | Admitting: Cardiovascular Disease

## 2020-04-25 ENCOUNTER — Other Ambulatory Visit: Payer: Self-pay

## 2020-04-25 VITALS — BP 138/62 | HR 60 | Ht 68.0 in | Wt 144.0 lb

## 2020-04-25 DIAGNOSIS — I350 Nonrheumatic aortic (valve) stenosis: Secondary | ICD-10-CM | POA: Diagnosis not present

## 2020-04-25 DIAGNOSIS — I6523 Occlusion and stenosis of bilateral carotid arteries: Secondary | ICD-10-CM | POA: Diagnosis not present

## 2020-04-25 NOTE — Progress Notes (Signed)
[facility] Consult Note  Chief Complaint:  New patient-TAVR consult  History of Present Illness: 87 yo male with history of carotid artery disease, PAD, paroxysmal atrial fibrillation, CAD s/p 5V CABG in 2002, GERD, HTN, hyperlipidemia, dementia and aortic stenosis who is here today as a new consult, referred by Dr. Audie Box for further evaluation of his aortic stenosis and possible TAVR. He had 4V CABG in 2002. He had been followed in our office for many years for his CAD and for moderate aortic stenosis but was lost to follow up since 2016. He is known to have mild dementia. He has moderate carotid artery disease by dopplers in 2015. He was admitted to the hospital in [place], [place] in December 2021 with chest pain and dyspnea and was found to be in atrial fibrillation with RVR. He was cardioverted to sinus but not started on anti-coagulation due to anemia. He had dark stools while admitted and was felt to have GI bleeding. He was anemic with Hgb as low as 6 per family and required blood transfusion. His family thinks he had a reaction to the blood transfusion. He has been referred to see GI and was seen last week by Alonza Bogus, PA-C in the GI office. He was felt to be too high risk for endoscopy.  He was also treated for pneumonia. Echo in the outside hospital showed severe aortic stenosis. He was seen by Dr. Audie Box in our cardiology office 04/21/20. Echo 04/22/20 with LVEF=60-65%, moderate LVH, mild MR. The aortic valve leaflets are severely thickened and calcified with mean gradient 43 mmHg, peak gradient 85.4 mmHg, AVA 0.84 cm2, dimensionless index 0.35.   He denies having any recent dyspnea or chest pain. He is a poor historian. His daughter provides most of his history. He does not recall what he did for a living. His memory issues have advanced recently per his family. He had chest pain and dyspnea before he was admitted several weeks ago but he was anemic and in rapid atrial fibrillation  at that time. He has had swelling in his ankles and feet. He is taking Lasix daily. No dizziness or near syncope.  He seems to very comfortable at home. He falls asleep easily while I am talking to him.    He lives with his daughter Jolaine Artist here in [place]. He is retired as a Dispensing optician. He is here today with his daughter Mateo Flow. He goes to the dentist regularly and has no active dental issues.   Primary Care Physician: Cassandria Anger, MD Primary Cardiologist: Marry Guan Referring Cardiologist: Marry Guan  Past Medical History:  Diagnosis Date  . Anxiety   . Aortic stenosis   . Arthritis   . Carotid artery disease ([place])   . Coronary artery disease    sees Dr. Verl Blalock  . Depression   . DJD (degenerative joint disease)    right hip  . GERD (gastroesophageal reflux disease)    hx of  . Glaucoma   . Glucose intolerance (impaired glucose tolerance)   . Headache(784.0)    hx of migraines  . Herpes zoster 2009   r. scalp and opth  . Hx of colonic polyps 2004   hyperplastic  . Hyperlipidemia   . Hypertension    sees Dr. Adelene Amas  . Internal and external hemorrhoids without complication   . Low back pain    facet arthropathy  . Lumbago   . Peripheral vascular disease ([place])   . Pneumonia 2009  rll with a small effusion  . Postherpetic neuralgia 2009    Past Surgical History:  Procedure Laterality Date  . ACNE CYST REMOVAL  1970's   removed x2 off the back  . CARPAL TUNNEL RELEASE     left  . CATARACT EXTRACTION W/PHACO  02/13/2012   Procedure: CATARACT EXTRACTION PHACO AND INTRAOCULAR LENS PLACEMENT (IOC);  Surgeon: Adonis Brook, MD;  Location: [facility];  Service: Ophthalmology;  Laterality: Left;  . COLONOSCOPY    . CORONARY ARTERY BYPASS GRAFT  2002  . REPOSITION OF LENS  03/14/2012   Procedure: REPOSITION OF LENS;  Surgeon: Adonis Brook, MD;  Location: [facility];  Service: Ophthalmology;  Laterality: Left;  Reposition Ocular Lens Left Eye  . thumb tendon surgery      left  . TONSILLECTOMY     age 22    Current Outpatient Medications  Medication Sig Dispense Refill  . Cholecalciferol (VITAMIN D3) 50 MCG (2000 UT) capsule Take 1 capsule (2,000 Units total) by mouth daily. 100 capsule 3  . doxycycline (VIBRAMYCIN) 100 MG capsule Take 100 mg by mouth 2 (two) times daily.    . ferrous sulfate 325 (65 FE) MG tablet Take 325 mg by mouth daily with breakfast.    . furosemide (LASIX) 20 MG tablet Take 1 tablet (20 mg total) by mouth daily. 90 tablet 3  . gabapentin (NEURONTIN) 100 MG capsule Take 100-300 mg by mouth 3 (three) times daily.    . metoprolol succinate (TOPROL-XL) 25 MG 24 hr tablet Take 1 tablet (25 mg total) by mouth 2 (two) times daily. 180 tablet 3  . pantoprazole (PROTONIX) 20 MG tablet Take 1 tablet (20 mg total) by mouth daily. 90 tablet 3  . TRAVATAN Z 0.004 % SOLN ophthalmic solution Place 1 drop into both eyes daily.     No current facility-administered medications for this visit.    Allergies  Allergen Reactions  . Atorvastatin Other (See Comments)    Muscle aches Muscle aches Muscle aches  . Benazepril Hcl Cough  . Cosopt [Dorzolamide Hcl-Timolol Mal] Other (See Comments)    Eyes water  . Ezetimibe-Simvastatin Other (See Comments)    REACTION: aches REACTION: aches REACTION: aches  . Aricept [Donepezil Hcl]     hallucinations  . Norvasc [Amlodipine Besylate]     swelling  . Zoloft [Sertraline]     Social History   Socioeconomic History  . Marital status: Divorced    Spouse name: Not on file  . Number of children: 3  . Years of education: Not on file  . Highest education level: Not on file  Occupational History    Employer: RETIRED  Tobacco Use  . Smoking status: Former Smoker    Packs/day: 0.75    Years: 25.00    Pack years: 18.75    Quit date: 04/25/1988    Years since quitting: 32.0  . Smokeless tobacco: Never Used  . Tobacco comment: quit at age 81  Vaping Use  . Vaping Use: Never used  Substance and  Sexual Activity  . Alcohol use: Not Currently    Alcohol/week: 0.0 standard drinks    Comment: infrequent glass of wine  . Drug use: No  . Sexual activity: Not Currently  Other Topics Concern  . Not on file  Social History Narrative   Daughter lives with him      One story home      Right handed      Highest level of edu- 2 years of  college   Social Determinants of Radio broadcast assistant Strain: Not on file  Food Insecurity: Not on file  Transportation Needs: Not on file  Physical Activity: Not on file  Stress: Not on file  Social Connections: Not on file  Intimate Partner Violence: Not on file    Family History  Problem Relation Age of Onset  . Breast cancer Mother   . Pulmonary embolism Father   . Heart attack Father   . Heart disease Sister   . Heart disease Brother   . Colon cancer Neg Hx   . Esophageal cancer Neg Hx   . Pancreatic cancer Neg Hx   . Stomach cancer Neg Hx     Review of Systems:  As stated in the HPI and otherwise negative.   BP 138/62   Pulse 60   Ht 5\' 8"  (1.727 m)   Wt 144 lb (65.3 kg)   SpO2 99%   BMI 21.90 kg/m   Physical Examination: General: Thin, frail elderly male. Pleasant HEENT: OP clear, mucus membranes moist  SKIN: warm, dry. No rashes. Neuro: No focal deficits  Musculoskeletal: Muscle strength 5/5 all ext  Psychiatric: Mood and affect normal  Neck: No JVD, no carotid bruits, no thyromegaly, no lymphadenopathy.  Lungs:Clear bilaterally, no wheezes, rhonci, crackles Cardiovascular: Regular rate and rhythm. Loud, harsh, late peaking systolic murmur.  Abdomen:Soft. Bowel sounds present. Non-tender.  Extremities: 1+ bilateral ankle edema.   EKG:  EKG is not ordered today. The ekg ordered today demonstrates   Echo 04/22/20: 1. Left ventricular ejection fraction, by estimation, is 60 to 65%. The  left ventricle has normal function. The left ventricle has no regional  wall motion abnormalities. There is moderate  concentric left ventricular  hypertrophy. Left ventricular  diastolic parameters are consistent with Grade I diastolic dysfunction  (impaired relaxation). Elevated left ventricular end-diastolic pressure.  2. Right ventricular systolic function is normal. The right ventricular  size is normal.  3. Left atrial size was moderately dilated.  4. The mitral valve is normal in structure. Mild mitral valve  regurgitation. No evidence of mitral stenosis. Moderate mitral annular  calcification.  5. The aortic valve is calcified. There is severe calcifcation of the  aortic valve. There is severe thickening of the aortic valve. Aortic valve  regurgitation is not visualized. Severe aortic valve stenosis. Aortic  valve area, by VTI measures 1.09 cm.  Aortic valve mean gradient measures 43.0 mmHg. Aortic valve Vmax measures  4.62 m/s.  6. The inferior vena cava is normal in size with greater than 50%  respiratory variability, suggesting right atrial pressure of 3 mmHg.   FINDINGS  Left Ventricle: Left ventricular ejection fraction, by estimation, is 60  to 65%. The left ventricle has normal function. The left ventricle has no  regional wall motion abnormalities. The left ventricular internal cavity  size was normal in size. There is  moderate concentric left ventricular hypertrophy. Left ventricular  diastolic parameters are consistent with Grade I diastolic dysfunction  (impaired relaxation). Elevated left ventricular end-diastolic pressure.   Right Ventricle: The right ventricular size is normal. No increase in  right ventricular wall thickness. Right ventricular systolic function is  normal.   Left Atrium: Left atrial size was moderately dilated.   Right Atrium: Right atrial size was normal in size.   Pericardium: There is no evidence of pericardial effusion.   Mitral Valve: The mitral valve is normal in structure. Moderate mitral  annular calcification. Mild mitral valve  regurgitation.  No evidence of  mitral valve stenosis.   Tricuspid Valve: The tricuspid valve is normal in structure. Tricuspid  valve regurgitation is trivial. No evidence of tricuspid stenosis.   Aortic Valve: The aortic valve is calcified. There is severe calcifcation  of the aortic valve. There is severe thickening of the aortic valve.  Aortic valve regurgitation is not visualized. Severe aortic stenosis is  present. Aortic valve mean gradient  measures 43.0 mmHg. Aortic valve peak gradient measures 85.4 mmHg. Aortic  valve area, by VTI measures 1.09 cm.   Pulmonic Valve: The pulmonic valve was normal in structure. Pulmonic valve  regurgitation is trivial. No evidence of pulmonic stenosis.   Aorta: The aortic root is normal in size and structure.   Venous: The inferior vena cava is normal in size with greater than 50%  respiratory variability, suggesting right atrial pressure of 3 mmHg.   IAS/Shunts: No atrial level shunt detected by color flow Doppler.     LEFT VENTRICLE  PLAX 2D  LVIDd:     4.30 cm Diastology  LVIDs:     2.80 cm LV e' medial:  4.75 cm/s  LV PW:     1.30 cm LV E/e' medial: 27.8  LV IVS:    1.50 cm LV e' lateral:  6.06 cm/s  LVOT diam:   2.00 cm LV E/e' lateral: 21.8  LV SV:     95  LV SV Index:  53  LVOT Area:   3.14 cm     RIGHT VENTRICLE  RVSP:      16.5 mmHg   LEFT ATRIUM      Index    RIGHT ATRIUM      Index  LA diam:   5.00 cm 2.79 cm/m RA Pressure: 3.00 mmHg  LA Vol (A2C): 53.3 ml 29.70 ml/m RA Area:   13.10 cm  LA Vol (A4C): 61.6 ml 34.33 ml/m RA Volume:  31.10 ml 17.33 ml/m  AORTIC VALVE  AV Area (Vmax):  0.84 cm  AV Area (Vmean):  1.04 cm  AV Area (VTI):   1.09 cm  AV Vmax:      462.00 cm/s  AV Vmean:     261.200 cm/s  AV VTI:      0.872 m  AV Peak Grad:   85.4 mmHg  AV Mean Grad:   43.0 mmHg  LVOT Vmax:     123.00 cm/s  LVOT Vmean:     86.360 cm/s  LVOT VTI:     0.302 m  LVOT/AV VTI ratio: 0.35    AORTA  Ao Root diam: 3.30 cm  Ao Asc diam: 3.20 cm   MV E velocity: 132.20 cm/s TRICUSPID VALVE  MV A velocity: 132.40 cm/s TR Peak grad:  13.5 mmHg  MV E/A ratio: 1.00     TR Vmax:    184.00 cm/s               Estimated RAP: 3.00 mmHg               RVSP:      16.5 mmHg                 SHUNTS               Systemic VTI: 0.30 m               Systemic Diam: 2.00 cm   Recent Labs: 08/04/2019: TSH 1.29 04/19/2020: ALT 12; BUN 19; Creatinine, Ser 1.13; Hemoglobin 10.7; Platelets 221.0; Potassium  4.4; Sodium 140 04/21/2020: BNP 635.0    Wt Readings from Last 3 Encounters:  04/25/20 144 lb (65.3 kg)  04/22/20 147 lb 4 oz (66.8 kg)  04/21/20 148 lb (67.1 kg)     Other studies Reviewed: Additional studies/ records that were reviewed today include: echo images, office notes. Review of the above records demonstrates: severe AS   Assessment and Plan:   1. Severe Aortic Valve Stenosis: He has severe, stage D aortic valve stenosis. I have personally reviewed the echo images. The aortic valve is thickened, calcified with limited leaflet mobility. He is not a candidate for conventional AVR by surgical approach. Given his advanced age with progressive dementia and co-morbidities including anemia due to GI bleeding, I do not think he is a good candidate for TAVR. He may not tolerate anti-platelet therapy post TAVR. I doubt we could improve his overall quality of life by performing TAVR. I have reviewed this with the patient and is daughter Mateo Flow. His family is in agreement that Hospice is the best approach.   I will continue his current medication. He will follow-up with Dr. Audie Box and we can assist with Hospice planning.     Current medicines are reviewed at length with the patient today.  The patient does not have concerns  regarding medicines.  The following changes have been made:  no change  Labs/ tests ordered today include:  No orders of the defined types were placed in this encounter.    Disposition:   F/U with Dr. Audie Box.    Signed, Lauree Chandler, MD 04/25/2020 8:49 AM    [address], [place], [place]  [address] Phone: [phone]; Fax: [phone]

## 2020-04-25 NOTE — Patient Instructions (Signed)
Medication Instructions:  No changes *If you need a refill on your cardiac medications before your next appointment, please call your pharmacy*   Lab Work: None    Testing/Procedures: None    Follow-Up: With Dr. Audie Box as scheduled.  Other Instructions

## 2020-04-25 NOTE — Telephone Encounter (Signed)
LCSW received referral from Dr. Audie Box and [name], LPN, regarding f/u from this pt's appointment this morning. Pt recommended for hospice approach for aortic stenosis. This recommendation appears to have been discussed by Dr. Angelena Form with pt daughter Mateo Flow during appointment. CSW will f/u and offer choice for pt, HIPAA compliant message left for pt daughter Mateo Flow at [phone]. Requested call back.   Westley Hummer, MSW, [place]  [phone]

## 2020-04-26 ENCOUNTER — Encounter: Payer: Self-pay | Admitting: Podiatry

## 2020-04-26 ENCOUNTER — Telehealth: Payer: Self-pay | Admitting: Licensed Clinical Social Worker

## 2020-04-26 ENCOUNTER — Ambulatory Visit (INDEPENDENT_AMBULATORY_CARE_PROVIDER_SITE_OTHER): Payer: Medicare Other | Admitting: Podiatry

## 2020-04-26 ENCOUNTER — Other Ambulatory Visit: Payer: Self-pay

## 2020-04-26 DIAGNOSIS — M79676 Pain in unspecified toe(s): Secondary | ICD-10-CM

## 2020-04-26 DIAGNOSIS — B351 Tinea unguium: Secondary | ICD-10-CM | POA: Diagnosis not present

## 2020-04-26 DIAGNOSIS — D689 Coagulation defect, unspecified: Secondary | ICD-10-CM

## 2020-04-26 DIAGNOSIS — I739 Peripheral vascular disease, unspecified: Secondary | ICD-10-CM | POA: Diagnosis not present

## 2020-04-26 NOTE — Progress Notes (Signed)
Heart and Vascular Care Navigation  04/26/2020  [name] [name] Jan 05, 1933 [id number]  Reason for Referral:  Hospice Services Referral                                                                                                    Assessment:                     CSW was able to reach pt daughter [name] [name] via telephone at [phone]. Introduced self, role, reason for call. I let her know I had reached out to her sister [name] [name] as well. I shared that I had been made aware that given pt holistic medical picture and after the conversations w/ Dr. Angelena Form that pt daughter [name] [name] had indicated that they would feel comfortable proceeding with a hospice referral. [name] [name] agrees that they discussed as a family that if pt was not eligible for intervention that they would like to proceed w/ that referral. CSW provided choice and discussed hospice providers that service pt area. Pt daughter has heard of [facility] and would like CSW to connect her with their liaisons. I provided my name and number again to [facility] should she not hear from referral within 24-48 hrs and if she should have any additional questions moving forward.                   HRT/VAS Care Coordination    Patients Home Cardiology Office [place] Team Social Worker   Social Worker Name: Valeda Malm, [place] [facility] [phone]   Living arrangements for the past 2 months Single Family Home   Lives with: Adult Children   Patient Current Librarian, academic; Traditional Medicare   Patient Has Concern With Paying Medical Bills No   Does Patient Have Prescription Coverage? Yes   Home Assistive Devices/Equipment None   [facility]  referred 1/11      Social History:                                                                             SDOH Screenings   Alcohol Screen: Not on file   Depression (PHQ2-9): Medium Risk  . PHQ-2 Score: 7  Financial Resource Strain: Not on file  Food Insecurity: Not on file  Housing: Not on file  Physical Activity: Not on file  Social Connections: Not on file  Stress: Not on file  Tobacco Use: Medium Risk  . Smoking Tobacco Use: Former Smoker  . Smokeless Tobacco Use: Never Used  Transportation Needs: Not on file     Other Care Navigation Interventions:     Patient Referred to: [facility] now called [facility]  Follow-up plan:   CSW spoke with liaison [name] [name] w/ [facility], provided pt name and DOB as well as [name] [name] as contact.  [name] [name] will complete referral and someone from her team will be in touch with pt family. They will reach back out to our clinic should they need any additional orders etc. CSW remains available as needed for pt family/clinic needs. I will send update to Dr. Audie Box and [name], LPN.

## 2020-04-26 NOTE — Addendum Note (Signed)
Addended by: Caprice Beaver T on: 04/26/2020 10:03 AM   Modules accepted: Orders

## 2020-04-26 NOTE — Progress Notes (Signed)
Complaint:  Visit Type: Patient returns to my office for continued preventative foot care services. Complaint: Patient states" my nails have grown long and thick and become painful to walk and wear shoes" Patient has been diagnosed with peripheral arterial disease.. The patient presents for preventative foot care services. No changes to ROS.  Patient is taking plavix.  Podiatric Exam: Vascular: dorsalis pedis and posterior tibial pulses are palpable bilateral. Capillary return is immediate. Temperature gradient is WNL. Skin turgor WNL  Sensorium: Normal Semmes Weinstein monofilament test. Normal tactile sensation bilaterally. Nail Exam: Pt has thick disfigured discolored nails with subungual debris noted bilateral entire nail hallux through fifth toenails Ulcer Exam: There is no evidence of ulcer or pre-ulcerative changes or infection. Orthopedic Exam: Muscle tone and strength are WNL. No limitations in general ROM. No crepitus or effusions noted. Foot type and digits show no abnormalities. HAV 1st MPJ  B/L. Skin: No Porokeratosis. No infection or ulcers  Diagnosis:  Onychomycosis, , Pain in right toe, pain in left toes  HAV  B/L.  Treatment & Plan Procedures and Treatment: Consent by patient was obtained for treatment procedures.   Debridement of mycotic and hypertrophic toenails, 1 through 5 bilateral and clearing of subungual debris. No ulceration, no infection noted.   Return Visit-Office Procedure: Patient instructed to return to the office for a follow up visit 3 months for continued evaluation and treatment.    Gardiner Barefoot DPM

## 2020-04-26 NOTE — Telephone Encounter (Signed)
Re-attempted to reach pt daughter Mateo Flow who was present during appointment yesterday.  No answer again, left HIPAA compliant message on [phone].   If I do not hear back from [facility] this afternoon I will attempt Candi who lives w/ pt to discuss referral.   Westley Hummer, MSW, [place]  [phone]

## 2020-04-28 ENCOUNTER — Telehealth: Payer: Self-pay | Admitting: Licensed Clinical Social Worker

## 2020-04-28 DIAGNOSIS — I2510 Atherosclerotic heart disease of native coronary artery without angina pectoris: Secondary | ICD-10-CM | POA: Diagnosis not present

## 2020-04-28 DIAGNOSIS — I739 Peripheral vascular disease, unspecified: Secondary | ICD-10-CM | POA: Diagnosis not present

## 2020-04-28 DIAGNOSIS — I350 Nonrheumatic aortic (valve) stenosis: Secondary | ICD-10-CM | POA: Diagnosis not present

## 2020-04-28 DIAGNOSIS — I351 Nonrheumatic aortic (valve) insufficiency: Secondary | ICD-10-CM | POA: Diagnosis not present

## 2020-04-28 DIAGNOSIS — I10 Essential (primary) hypertension: Secondary | ICD-10-CM | POA: Diagnosis not present

## 2020-04-28 DIAGNOSIS — D649 Anemia, unspecified: Secondary | ICD-10-CM | POA: Diagnosis not present

## 2020-04-28 DIAGNOSIS — E785 Hyperlipidemia, unspecified: Secondary | ICD-10-CM | POA: Diagnosis not present

## 2020-04-28 DIAGNOSIS — H409 Unspecified glaucoma: Secondary | ICD-10-CM | POA: Diagnosis not present

## 2020-04-28 DIAGNOSIS — M1990 Unspecified osteoarthritis, unspecified site: Secondary | ICD-10-CM | POA: Diagnosis not present

## 2020-04-28 DIAGNOSIS — K219 Gastro-esophageal reflux disease without esophagitis: Secondary | ICD-10-CM | POA: Diagnosis not present

## 2020-04-28 DIAGNOSIS — F0390 Unspecified dementia without behavioral disturbance: Secondary | ICD-10-CM | POA: Diagnosis not present

## 2020-04-28 DIAGNOSIS — F419 Anxiety disorder, unspecified: Secondary | ICD-10-CM | POA: Diagnosis not present

## 2020-04-28 NOTE — Telephone Encounter (Signed)
Spoke with Chrislyn, RN with [facility], on 1/12 she confirmed pt has an intake appointment for home hospice services today 1/13. No additional needs noted at this time. Dr. Audie Box and [name], LPN updated.   Westley Hummer, MSW, [place]  [phone]

## 2020-04-29 DIAGNOSIS — I10 Essential (primary) hypertension: Secondary | ICD-10-CM | POA: Diagnosis not present

## 2020-04-29 DIAGNOSIS — I350 Nonrheumatic aortic (valve) stenosis: Secondary | ICD-10-CM | POA: Diagnosis not present

## 2020-04-29 DIAGNOSIS — E785 Hyperlipidemia, unspecified: Secondary | ICD-10-CM | POA: Diagnosis not present

## 2020-04-29 DIAGNOSIS — I351 Nonrheumatic aortic (valve) insufficiency: Secondary | ICD-10-CM | POA: Diagnosis not present

## 2020-04-29 DIAGNOSIS — I2510 Atherosclerotic heart disease of native coronary artery without angina pectoris: Secondary | ICD-10-CM | POA: Diagnosis not present

## 2020-04-29 DIAGNOSIS — I739 Peripheral vascular disease, unspecified: Secondary | ICD-10-CM | POA: Diagnosis not present

## 2020-05-02 ENCOUNTER — Ambulatory Visit (HOSPITAL_COMMUNITY): Payer: Medicare Other

## 2020-05-04 DIAGNOSIS — I2510 Atherosclerotic heart disease of native coronary artery without angina pectoris: Secondary | ICD-10-CM | POA: Diagnosis not present

## 2020-05-04 DIAGNOSIS — I350 Nonrheumatic aortic (valve) stenosis: Secondary | ICD-10-CM | POA: Diagnosis not present

## 2020-05-04 DIAGNOSIS — E785 Hyperlipidemia, unspecified: Secondary | ICD-10-CM | POA: Diagnosis not present

## 2020-05-04 DIAGNOSIS — I739 Peripheral vascular disease, unspecified: Secondary | ICD-10-CM | POA: Diagnosis not present

## 2020-05-04 DIAGNOSIS — I10 Essential (primary) hypertension: Secondary | ICD-10-CM | POA: Diagnosis not present

## 2020-05-04 DIAGNOSIS — I351 Nonrheumatic aortic (valve) insufficiency: Secondary | ICD-10-CM | POA: Diagnosis not present

## 2020-05-05 ENCOUNTER — Telehealth: Payer: Self-pay | Admitting: Internal Medicine

## 2020-05-05 DIAGNOSIS — I10 Essential (primary) hypertension: Secondary | ICD-10-CM | POA: Diagnosis not present

## 2020-05-05 DIAGNOSIS — I351 Nonrheumatic aortic (valve) insufficiency: Secondary | ICD-10-CM | POA: Diagnosis not present

## 2020-05-05 DIAGNOSIS — I739 Peripheral vascular disease, unspecified: Secondary | ICD-10-CM | POA: Diagnosis not present

## 2020-05-05 DIAGNOSIS — E785 Hyperlipidemia, unspecified: Secondary | ICD-10-CM | POA: Diagnosis not present

## 2020-05-05 DIAGNOSIS — I2510 Atherosclerotic heart disease of native coronary artery without angina pectoris: Secondary | ICD-10-CM | POA: Diagnosis not present

## 2020-05-05 DIAGNOSIS — I350 Nonrheumatic aortic (valve) stenosis: Secondary | ICD-10-CM | POA: Diagnosis not present

## 2020-05-05 NOTE — Telephone Encounter (Signed)
[name] from [facility] calling, states the patient has a ct scan scheduled related to low hemoglobin, but on hospice medicare wont cover it. [phone]

## 2020-05-06 DIAGNOSIS — I739 Peripheral vascular disease, unspecified: Secondary | ICD-10-CM | POA: Diagnosis not present

## 2020-05-06 DIAGNOSIS — I2510 Atherosclerotic heart disease of native coronary artery without angina pectoris: Secondary | ICD-10-CM | POA: Diagnosis not present

## 2020-05-06 DIAGNOSIS — I10 Essential (primary) hypertension: Secondary | ICD-10-CM | POA: Diagnosis not present

## 2020-05-06 DIAGNOSIS — I350 Nonrheumatic aortic (valve) stenosis: Secondary | ICD-10-CM | POA: Diagnosis not present

## 2020-05-06 DIAGNOSIS — E785 Hyperlipidemia, unspecified: Secondary | ICD-10-CM | POA: Diagnosis not present

## 2020-05-06 DIAGNOSIS — I351 Nonrheumatic aortic (valve) insufficiency: Secondary | ICD-10-CM | POA: Diagnosis not present

## 2020-05-06 NOTE — Telephone Encounter (Signed)
Called Julie there was no answer LMOMw/MD response../lmb 

## 2020-05-06 NOTE — Telephone Encounter (Signed)
I was not the ordering physician.  They need to check with Alonza Bogus, PA who ordered the test.  Thanks

## 2020-05-09 DIAGNOSIS — I739 Peripheral vascular disease, unspecified: Secondary | ICD-10-CM | POA: Diagnosis not present

## 2020-05-09 DIAGNOSIS — E785 Hyperlipidemia, unspecified: Secondary | ICD-10-CM | POA: Diagnosis not present

## 2020-05-09 DIAGNOSIS — I351 Nonrheumatic aortic (valve) insufficiency: Secondary | ICD-10-CM | POA: Diagnosis not present

## 2020-05-09 DIAGNOSIS — I2510 Atherosclerotic heart disease of native coronary artery without angina pectoris: Secondary | ICD-10-CM | POA: Diagnosis not present

## 2020-05-09 DIAGNOSIS — I350 Nonrheumatic aortic (valve) stenosis: Secondary | ICD-10-CM | POA: Diagnosis not present

## 2020-05-09 DIAGNOSIS — I10 Essential (primary) hypertension: Secondary | ICD-10-CM | POA: Diagnosis not present

## 2020-05-09 NOTE — Progress Notes (Signed)
Called, spoke to daughter- okay per DPR-  Daughter made appointment for Thursday.

## 2020-05-10 ENCOUNTER — Ambulatory Visit: Payer: Medicare Other | Admitting: Podiatry

## 2020-05-10 ENCOUNTER — Telehealth: Payer: Self-pay | Admitting: Cardiovascular Disease

## 2020-05-10 ENCOUNTER — Ambulatory Visit: Payer: Medicare Other | Admitting: Cardiovascular Disease

## 2020-05-10 ENCOUNTER — Telehealth: Payer: Self-pay | Admitting: Gastroenterology

## 2020-05-10 DIAGNOSIS — I351 Nonrheumatic aortic (valve) insufficiency: Secondary | ICD-10-CM | POA: Diagnosis not present

## 2020-05-10 DIAGNOSIS — E785 Hyperlipidemia, unspecified: Secondary | ICD-10-CM | POA: Diagnosis not present

## 2020-05-10 DIAGNOSIS — I350 Nonrheumatic aortic (valve) stenosis: Secondary | ICD-10-CM | POA: Diagnosis not present

## 2020-05-10 DIAGNOSIS — I739 Peripheral vascular disease, unspecified: Secondary | ICD-10-CM | POA: Diagnosis not present

## 2020-05-10 DIAGNOSIS — I2510 Atherosclerotic heart disease of native coronary artery without angina pectoris: Secondary | ICD-10-CM | POA: Diagnosis not present

## 2020-05-10 DIAGNOSIS — I10 Essential (primary) hypertension: Secondary | ICD-10-CM | POA: Diagnosis not present

## 2020-05-10 NOTE — Telephone Encounter (Signed)
Spoke with [name] from Hosp[facility]. She reports Echo is not covered by hospice benefit and medicare may decide not to pay and patient would have to pay for the echo out of pocket.

## 2020-05-10 NOTE — Telephone Encounter (Signed)
The pt was scheduled while in the office please send to the [facility]. Looks like Maya was with her.

## 2020-05-10 NOTE — Telephone Encounter (Signed)
[name] from [facility] calling regarding a CT scan that Alonza Bogus ordered however the patient is currently in hospice please call to discuss further. [phone]

## 2020-05-10 NOTE — Telephone Encounter (Signed)
He had an echo on 04/22/2020.  He does not need any repeat imaging.  I do not understand what this is about.   [place] T. Audie Box, MD, [place]  [address] [place], [place] [address] [phone]  10:23 AM

## 2020-05-10 NOTE — Telephone Encounter (Signed)
Wanted to tell Dr. Audie Box and nurse that the echo patient is scheduled for, it is not hospice covered.

## 2020-05-11 DIAGNOSIS — E785 Hyperlipidemia, unspecified: Secondary | ICD-10-CM | POA: Diagnosis not present

## 2020-05-11 DIAGNOSIS — I739 Peripheral vascular disease, unspecified: Secondary | ICD-10-CM | POA: Diagnosis not present

## 2020-05-11 DIAGNOSIS — I2510 Atherosclerotic heart disease of native coronary artery without angina pectoris: Secondary | ICD-10-CM | POA: Diagnosis not present

## 2020-05-11 DIAGNOSIS — I350 Nonrheumatic aortic (valve) stenosis: Secondary | ICD-10-CM | POA: Diagnosis not present

## 2020-05-11 DIAGNOSIS — I351 Nonrheumatic aortic (valve) insufficiency: Secondary | ICD-10-CM | POA: Diagnosis not present

## 2020-05-11 DIAGNOSIS — I10 Essential (primary) hypertension: Secondary | ICD-10-CM | POA: Diagnosis not present

## 2020-05-11 NOTE — Telephone Encounter (Signed)
Spoke with [name] [name] and She was just informing that patient is now on Hospice.

## 2020-05-11 NOTE — Progress Notes (Unsigned)
Cardiology Office Note:   Date:  05/12/2020  NAME:  [name] [name]    MRN: [id number] DOB:  [birth year]   PCP:  Cassandria Anger, MD  Cardiologist:  No primary care provider on file.   Referring MD: Cassandria Anger, MD   Chief Complaint  Patient presents with  . Aortic Stenosis   History of Present Illness:   [name] [name] is a 87 y.o. male with a hx of CAD s/p CABG, carotid artery disease, dementia, PAD, HTN, HLD who presents for follow-up of severe AS. Evaluated by structural heart and not a good TAVR candidate due to anemia and advanced dementia. Referred to hospice. He seems to be doing well on hospice. They are focused on comfort measures. We did go over his diagnosis of severe aortic stenosis and that he is not a candidate for TAVR given his GI bleed advanced dementia. They are okay with proceeding with hospice. He is DNR. They understand the next steps. They are still in shock about the heart valve condition however I did counsel him that this is not unexpected for his age. He denies any chest pain. He does get short of breath when he tries to exert himself. I did inform the daughter today in office that the focus should be on comfort measures. He is euvolemic on examination. Still in sinus rhythm. She reports he is losing weight. Weight loss has been an issue. This was before his diagnosis of aortic stenosis. Overall doing the best that I would expect him to do given his diagnosis.  Problem List 1. CAD s/p CABG -05/07/2000 -LIMA-LAD, radial artery graft to PDA, SVG-D1, SVG-OM1-2 2. PAD -L CFA stenosis  3. Carotid artery stenosis -RICA 123456 -LICA 123456 4. Dementia 5. Anemia 6. HTN 7. HLD -statin intolerance  8. Persistent Afib -dx 04/08/2020 -> [name] -unstable and DCCV x 3 -in NSR 04/21/2020 -AC deferred due to anemia and concerns for GI bleed   Past Medical History: Past Medical History:  Diagnosis Date  . Anxiety   . Aortic stenosis   . Arthritis   .  Carotid artery disease ([place])   . Coronary artery disease    sees Dr. Verl Blalock  . Depression   . DJD (degenerative joint disease)    right hip  . GERD (gastroesophageal reflux disease)    hx of  . Glaucoma   . Glucose intolerance (impaired glucose tolerance)   . Headache(784.0)    hx of migraines  . Herpes zoster 2009   r. scalp and opth  . Hx of colonic polyps 2004   hyperplastic  . Hyperlipidemia   . Hypertension    sees Dr. Adelene Amas  . Internal and external hemorrhoids without complication   . Low back pain    facet arthropathy  . Lumbago   . Peripheral vascular disease ([place])   . Pneumonia 2009   rll with a small effusion  . Postherpetic neuralgia 2009    Past Surgical History: Past Surgical History:  Procedure Laterality Date  . ACNE CYST REMOVAL  1970's   removed x2 off the back  . CARPAL TUNNEL RELEASE     left  . CATARACT EXTRACTION W/PHACO  02/13/2012   Procedure: CATARACT EXTRACTION PHACO AND INTRAOCULAR LENS PLACEMENT (IOC);  Surgeon: Adonis Brook, MD;  Location: [facility];  Service: Ophthalmology;  Laterality: Left;  . COLONOSCOPY    . CORONARY ARTERY BYPASS GRAFT  2002  . REPOSITION OF LENS  03/14/2012   Procedure: REPOSITION  OF LENS;  Surgeon: Adonis Brook, MD;  Location: [facility];  Service: Ophthalmology;  Laterality: Left;  Reposition Ocular Lens Left Eye  . thumb tendon surgery     left  . TONSILLECTOMY     age 43    Current Medications: Current Meds  Medication Sig  . Cholecalciferol (VITAMIN D3) 50 MCG (2000 UT) capsule Take 1 capsule (2,000 Units total) by mouth daily.  Marland Kitchen doxycycline (VIBRAMYCIN) 100 MG capsule Take 100 mg by mouth 2 (two) times daily.  . ferrous sulfate 325 (65 FE) MG tablet Take 325 mg by mouth daily with breakfast.  . furosemide (LASIX) 20 MG tablet Take 1 tablet (20 mg total) by mouth daily.  Marland Kitchen gabapentin (NEURONTIN) 100 MG capsule Take 100-300 mg by mouth 3 (three) times daily.  . metoprolol succinate (TOPROL-XL) 25 MG 24 hr  tablet Take 1 tablet (25 mg total) by mouth 2 (two) times daily.  . pantoprazole (PROTONIX) 20 MG tablet Take 1 tablet (20 mg total) by mouth daily.  . TRAVATAN Z 0.004 % SOLN ophthalmic solution Place 1 drop into both eyes daily.    Allergies:    Atorvastatin, Benazepril hcl, Cosopt [dorzolamide hcl-timolol mal], Ezetimibe-simvastatin, Aricept [donepezil hcl], Norvasc [amlodipine besylate], and Zoloft [sertraline]   Social History: Social History   Socioeconomic History  . Marital status: Divorced    Spouse name: Not on file  . Number of children: 3  . Years of education: Not on file  . Highest education level: Not on file  Occupational History    Employer: RETIRED  Tobacco Use  . Smoking status: Former Smoker    Packs/day: 0.75    Years: 25.00    Pack years: 18.75    Quit date: 04/25/1988    Years since quitting: 32.0  . Smokeless tobacco: Never Used  . Tobacco comment: quit at age 34  Vaping Use  . Vaping Use: Never used  Substance and Sexual Activity  . Alcohol use: Not Currently    Alcohol/week: 0.0 standard drinks    Comment: infrequent glass of wine  . Drug use: No  . Sexual activity: Not Currently  Other Topics Concern  . Not on file  Social History Narrative   Daughter lives with him      One story home      Right handed      Highest level of edu- 2 years of college   Social Determinants of Health   Financial Resource Strain: Not on file  Food Insecurity: Not on file  Transportation Needs: Not on file  Physical Activity: Not on file  Stress: Not on file  Social Connections: Not on file    Family History: The patient's family history includes Breast cancer in his mother; Heart attack in his father; Heart disease in his brother and sister; Pulmonary embolism in his father. There is no history of Colon cancer, Esophageal cancer, Pancreatic cancer, or Stomach cancer.  ROS:   All other ROS reviewed and negative. Pertinent positives noted in the HPI.      EKGs/Labs/Other Studies Reviewed:   The following studies were personally reviewed by me today:  TTE 04/22/2020 1. Left ventricular ejection fraction, by estimation, is 60 to 65%. The  left ventricle has normal function. The left ventricle has no regional  wall motion abnormalities. There is moderate concentric left ventricular  hypertrophy. Left ventricular  diastolic parameters are consistent with Grade I diastolic dysfunction  (impaired relaxation). Elevated left ventricular end-diastolic pressure.  2. Right ventricular  systolic function is normal. The right ventricular  size is normal.  3. Left atrial size was moderately dilated.  4. The mitral valve is normal in structure. Mild mitral valve  regurgitation. No evidence of mitral stenosis. Moderate mitral annular  calcification.  5. The aortic valve is calcified. There is severe calcifcation of the  aortic valve. There is severe thickening of the aortic valve. Aortic valve  regurgitation is not visualized. Severe aortic valve stenosis. Aortic  valve area, by VTI measures 1.09 cm.  Aortic valve mean gradient measures 43.0 mmHg. Aortic valve Vmax measures  4.62 m/s.  6. The inferior vena cava is normal in size with greater than 50%  respiratory variability, suggesting right atrial pressure of 3 mmHg.   Recent Labs: 08/04/2019: TSH 1.29 04/19/2020: ALT 12; BUN 19; Creatinine, Ser 1.13; Hemoglobin 10.7; Platelets 221.0; Potassium 4.4; Sodium 140 04/21/2020: BNP 635.0   Recent Lipid Panel    Component Value Date/Time   CHOL 273 (H) 04/28/2019 1208   TRIG 54.0 04/28/2019 1208   HDL 70.10 04/28/2019 1208   CHOLHDL 4 04/28/2019 1208   VLDL 10.8 04/28/2019 1208   LDLCALC 192 (H) 04/28/2019 1208   LDLDIRECT 136.2 09/28/2009 0800    Physical Exam:   VS:  BP 118/68   Pulse 70   Ht 5\' 8"  (1.727 m)   Wt 139 lb (63 kg)   SpO2 94%   BMI 21.13 kg/m    Wt Readings from Last 3 Encounters:  05/12/20 139 lb (63 kg)  04/25/20 144  lb (65.3 kg)  04/22/20 147 lb 4 oz (66.8 kg)    General: Well nourished, well developed, in no acute distress Head: Atraumatic, normal size  Eyes: PEERLA, EOMI  Neck: Supple, no JVD Endocrine: No thryomegaly Cardiac: Normal S1, S2; RRR; 3 out of 6 harsh systolic ejection murmur, absent S2, radiates into carotids Lungs: Clear to auscultation bilaterally, no wheezing, rhonchi or rales  Abd: Soft, nontender, no hepatomegaly  Ext: No edema, pulses 2+ Musculoskeletal: No deformities, BUE and BLE strength normal and equal Skin: Warm and dry, no rashes   Neuro: Alert and oriented to person, place, time, and situation, CNII-XII grossly intact, no focal deficits  Psych: Normal mood and affect   ASSESSMENT:   [name] [name] is a 87 y.o. male who presents for the following: 1. Severe aortic stenosis   2. Chronic diastolic heart failure ([place])   3. Coronary artery disease involving coronary bypass graft of native heart without angina pectoris   4. PAD (peripheral artery disease) ([facility])   5. Carotid artery stenosis, asymptomatic, bilateral     PLAN:   1. Severe aortic stenosis 2. Chronic diastolic heart failure ([place]) 3. Coronary artery disease involving coronary bypass graft of native heart without angina pectoris 4. PAD (peripheral artery disease) ([facility]) 5. Carotid artery stenosis, asymptomatic, bilateral -Severe symptomatic aortic stenosis. Complicated by diastolic heart failure. Has significant dementia. Also with GI bleed. Evaluated by structural heart team and felt not to be candidate for TAVR. Given his advanced dementia and GI bleed I do agree with this. Family has moved toward hospice care. I think this is appropriate. The focus should be on comfort measures and not on aggressive medical care. They are in agreement. Moving forward, we will see them as needed. I did reiterate that our thoughts and prayers are with them during this difficult time.  Disposition: Return if symptoms worsen or  fail to improve.  Medication Adjustments/Labs and Tests Ordered: Current medicines are  reviewed at length with the patient today.  Concerns regarding medicines are outlined above.  No orders of the defined types were placed in this encounter.  No orders of the defined types were placed in this encounter.   Patient Instructions  Medication Instructions:  The current medical regimen is effective;  continue present plan and medications.  *If you need a refill on your cardiac medications before your next appointment, please call your pharmacy*   Follow-Up: At [facility], you and your health needs are our priority.  As part of our continuing mission to provide you with exceptional heart care, we have created designated Provider Care Teams.  These Care Teams include your primary Cardiologist (physician) and Advanced Practice Providers (APPs -  Physician Assistants and Nurse Practitioners) who all work together to provide you with the care you need, when you need it.  We recommend signing up for the patient portal called "MyChart".  Sign up information is provided on this After Visit Summary.  MyChart is used to connect with patients for Virtual Visits (Telemedicine).  Patients are able to view lab/test results, encounter notes, upcoming appointments, etc.  Non-urgent messages can be sent to your provider as well.   To learn more about what you can do with MyChart, go to [email or web address].    Your next appointment:   As needed  The format for your next appointment:   In Person  Provider:   Eleonore Chiquito, MD        Time Spent with Patient: I have spent a total of 25 minutes with patient reviewing hospital notes, telemetry, EKGs, labs and examining the patient as well as establishing an assessment and plan that was discussed with the patient.  > 50% of time was spent in direct patient care.  Signed, Addison Naegeli. Audie Box, [place]  [address], [place], [place] [address] [phone]  05/12/2020 4:47 PM

## 2020-05-12 ENCOUNTER — Ambulatory Visit (INDEPENDENT_AMBULATORY_CARE_PROVIDER_SITE_OTHER): Payer: Medicare Other | Admitting: Cardiovascular Disease

## 2020-05-12 ENCOUNTER — Encounter: Payer: Self-pay | Admitting: Cardiovascular Disease

## 2020-05-12 ENCOUNTER — Other Ambulatory Visit: Payer: Self-pay

## 2020-05-12 VITALS — BP 118/68 | HR 70 | Ht 68.0 in | Wt 139.0 lb

## 2020-05-12 DIAGNOSIS — I5032 Chronic diastolic (congestive) heart failure: Secondary | ICD-10-CM

## 2020-05-12 DIAGNOSIS — I739 Peripheral vascular disease, unspecified: Secondary | ICD-10-CM

## 2020-05-12 DIAGNOSIS — I25810 Atherosclerosis of coronary artery bypass graft(s) without angina pectoris: Secondary | ICD-10-CM

## 2020-05-12 DIAGNOSIS — I350 Nonrheumatic aortic (valve) stenosis: Secondary | ICD-10-CM | POA: Diagnosis not present

## 2020-05-12 DIAGNOSIS — I6523 Occlusion and stenosis of bilateral carotid arteries: Secondary | ICD-10-CM | POA: Diagnosis not present

## 2020-05-12 NOTE — Patient Instructions (Signed)
Medication Instructions:  The current medical regimen is effective;  continue present plan and medications.  *If you need a refill on your cardiac medications before your next appointment, please call your pharmacy*    Follow-Up: At [facility], you and your health needs are our priority.  As part of our continuing mission to provide you with exceptional heart care, we have created designated Provider Care Teams.  These Care Teams include your primary Cardiologist (physician) and Advanced Practice Providers (APPs -  Physician Assistants and Nurse Practitioners) who all work together to provide you with the care you need, when you need it.  We recommend signing up for the patient portal called "MyChart".  Sign up information is provided on this After Visit Summary.  MyChart is used to connect with patients for Virtual Visits (Telemedicine).  Patients are able to view lab/test results, encounter notes, upcoming appointments, etc.  Non-urgent messages can be sent to your provider as well.   To learn more about what you can do with MyChart, go to [email or web address].    Your next appointment:   As needed  The format for your next appointment:   In Person  Provider:    O'Neal, MD      

## 2020-05-12 NOTE — Telephone Encounter (Signed)
Patient seen in office today- 01/27

## 2020-05-13 DIAGNOSIS — I739 Peripheral vascular disease, unspecified: Secondary | ICD-10-CM | POA: Diagnosis not present

## 2020-05-13 DIAGNOSIS — I350 Nonrheumatic aortic (valve) stenosis: Secondary | ICD-10-CM | POA: Diagnosis not present

## 2020-05-13 DIAGNOSIS — E785 Hyperlipidemia, unspecified: Secondary | ICD-10-CM | POA: Diagnosis not present

## 2020-05-13 DIAGNOSIS — I2510 Atherosclerotic heart disease of native coronary artery without angina pectoris: Secondary | ICD-10-CM | POA: Diagnosis not present

## 2020-05-13 DIAGNOSIS — I351 Nonrheumatic aortic (valve) insufficiency: Secondary | ICD-10-CM | POA: Diagnosis not present

## 2020-05-13 DIAGNOSIS — I10 Essential (primary) hypertension: Secondary | ICD-10-CM | POA: Diagnosis not present

## 2020-05-16 ENCOUNTER — Ambulatory Visit: Payer: Medicare Other | Admitting: Family

## 2020-05-16 DIAGNOSIS — I350 Nonrheumatic aortic (valve) stenosis: Secondary | ICD-10-CM | POA: Diagnosis not present

## 2020-05-16 DIAGNOSIS — I351 Nonrheumatic aortic (valve) insufficiency: Secondary | ICD-10-CM | POA: Diagnosis not present

## 2020-05-16 DIAGNOSIS — I10 Essential (primary) hypertension: Secondary | ICD-10-CM | POA: Diagnosis not present

## 2020-05-16 DIAGNOSIS — I739 Peripheral vascular disease, unspecified: Secondary | ICD-10-CM | POA: Diagnosis not present

## 2020-05-16 DIAGNOSIS — I2510 Atherosclerotic heart disease of native coronary artery without angina pectoris: Secondary | ICD-10-CM | POA: Diagnosis not present

## 2020-05-16 DIAGNOSIS — E785 Hyperlipidemia, unspecified: Secondary | ICD-10-CM | POA: Diagnosis not present

## 2020-05-17 DIAGNOSIS — I351 Nonrheumatic aortic (valve) insufficiency: Secondary | ICD-10-CM | POA: Diagnosis not present

## 2020-05-17 DIAGNOSIS — E785 Hyperlipidemia, unspecified: Secondary | ICD-10-CM | POA: Diagnosis not present

## 2020-05-17 DIAGNOSIS — D649 Anemia, unspecified: Secondary | ICD-10-CM | POA: Diagnosis not present

## 2020-05-17 DIAGNOSIS — F0390 Unspecified dementia without behavioral disturbance: Secondary | ICD-10-CM | POA: Diagnosis not present

## 2020-05-17 DIAGNOSIS — F419 Anxiety disorder, unspecified: Secondary | ICD-10-CM | POA: Diagnosis not present

## 2020-05-17 DIAGNOSIS — H409 Unspecified glaucoma: Secondary | ICD-10-CM | POA: Diagnosis not present

## 2020-05-17 DIAGNOSIS — I739 Peripheral vascular disease, unspecified: Secondary | ICD-10-CM | POA: Diagnosis not present

## 2020-05-17 DIAGNOSIS — M1990 Unspecified osteoarthritis, unspecified site: Secondary | ICD-10-CM | POA: Diagnosis not present

## 2020-05-17 DIAGNOSIS — I350 Nonrheumatic aortic (valve) stenosis: Secondary | ICD-10-CM | POA: Diagnosis not present

## 2020-05-17 DIAGNOSIS — I2510 Atherosclerotic heart disease of native coronary artery without angina pectoris: Secondary | ICD-10-CM | POA: Diagnosis not present

## 2020-05-17 DIAGNOSIS — I10 Essential (primary) hypertension: Secondary | ICD-10-CM | POA: Diagnosis not present

## 2020-05-17 DIAGNOSIS — K219 Gastro-esophageal reflux disease without esophagitis: Secondary | ICD-10-CM | POA: Diagnosis not present

## 2020-05-18 DIAGNOSIS — I350 Nonrheumatic aortic (valve) stenosis: Secondary | ICD-10-CM | POA: Diagnosis not present

## 2020-05-18 DIAGNOSIS — I2510 Atherosclerotic heart disease of native coronary artery without angina pectoris: Secondary | ICD-10-CM | POA: Diagnosis not present

## 2020-05-18 DIAGNOSIS — I10 Essential (primary) hypertension: Secondary | ICD-10-CM | POA: Diagnosis not present

## 2020-05-18 DIAGNOSIS — I351 Nonrheumatic aortic (valve) insufficiency: Secondary | ICD-10-CM | POA: Diagnosis not present

## 2020-05-18 DIAGNOSIS — I739 Peripheral vascular disease, unspecified: Secondary | ICD-10-CM | POA: Diagnosis not present

## 2020-05-18 DIAGNOSIS — E785 Hyperlipidemia, unspecified: Secondary | ICD-10-CM | POA: Diagnosis not present

## 2020-05-20 DIAGNOSIS — I2510 Atherosclerotic heart disease of native coronary artery without angina pectoris: Secondary | ICD-10-CM | POA: Diagnosis not present

## 2020-05-20 DIAGNOSIS — I739 Peripheral vascular disease, unspecified: Secondary | ICD-10-CM | POA: Diagnosis not present

## 2020-05-20 DIAGNOSIS — I351 Nonrheumatic aortic (valve) insufficiency: Secondary | ICD-10-CM | POA: Diagnosis not present

## 2020-05-20 DIAGNOSIS — I10 Essential (primary) hypertension: Secondary | ICD-10-CM | POA: Diagnosis not present

## 2020-05-20 DIAGNOSIS — I350 Nonrheumatic aortic (valve) stenosis: Secondary | ICD-10-CM | POA: Diagnosis not present

## 2020-05-20 DIAGNOSIS — E785 Hyperlipidemia, unspecified: Secondary | ICD-10-CM | POA: Diagnosis not present

## 2020-05-23 DIAGNOSIS — I2510 Atherosclerotic heart disease of native coronary artery without angina pectoris: Secondary | ICD-10-CM | POA: Diagnosis not present

## 2020-05-23 DIAGNOSIS — I351 Nonrheumatic aortic (valve) insufficiency: Secondary | ICD-10-CM | POA: Diagnosis not present

## 2020-05-23 DIAGNOSIS — I10 Essential (primary) hypertension: Secondary | ICD-10-CM | POA: Diagnosis not present

## 2020-05-23 DIAGNOSIS — I739 Peripheral vascular disease, unspecified: Secondary | ICD-10-CM | POA: Diagnosis not present

## 2020-05-23 DIAGNOSIS — I350 Nonrheumatic aortic (valve) stenosis: Secondary | ICD-10-CM | POA: Diagnosis not present

## 2020-05-23 DIAGNOSIS — E785 Hyperlipidemia, unspecified: Secondary | ICD-10-CM | POA: Diagnosis not present

## 2020-05-24 ENCOUNTER — Telehealth: Payer: Self-pay | Admitting: Internal Medicine

## 2020-05-24 DIAGNOSIS — I351 Nonrheumatic aortic (valve) insufficiency: Secondary | ICD-10-CM | POA: Diagnosis not present

## 2020-05-24 DIAGNOSIS — I2510 Atherosclerotic heart disease of native coronary artery without angina pectoris: Secondary | ICD-10-CM | POA: Diagnosis not present

## 2020-05-24 DIAGNOSIS — I350 Nonrheumatic aortic (valve) stenosis: Secondary | ICD-10-CM | POA: Diagnosis not present

## 2020-05-24 DIAGNOSIS — I739 Peripheral vascular disease, unspecified: Secondary | ICD-10-CM | POA: Diagnosis not present

## 2020-05-24 DIAGNOSIS — E785 Hyperlipidemia, unspecified: Secondary | ICD-10-CM | POA: Diagnosis not present

## 2020-05-24 DIAGNOSIS — I10 Essential (primary) hypertension: Secondary | ICD-10-CM | POA: Diagnosis not present

## 2020-05-24 NOTE — Telephone Encounter (Signed)
[name] from [facility] calling with a question about the metoprolol succinate (TOPROL-XL) 25 MG 24 hr tablet. She states the patients HR was 50 and BP was 124/54 and he has been a lot more fatigued so she was wondering if they needed to lower the dosage or stop it.  [name]- [phone] Ok to lvm

## 2020-05-25 DIAGNOSIS — I739 Peripheral vascular disease, unspecified: Secondary | ICD-10-CM | POA: Diagnosis not present

## 2020-05-25 DIAGNOSIS — E785 Hyperlipidemia, unspecified: Secondary | ICD-10-CM | POA: Diagnosis not present

## 2020-05-25 DIAGNOSIS — I10 Essential (primary) hypertension: Secondary | ICD-10-CM | POA: Diagnosis not present

## 2020-05-25 DIAGNOSIS — I351 Nonrheumatic aortic (valve) insufficiency: Secondary | ICD-10-CM | POA: Diagnosis not present

## 2020-05-25 DIAGNOSIS — I350 Nonrheumatic aortic (valve) stenosis: Secondary | ICD-10-CM | POA: Diagnosis not present

## 2020-05-25 DIAGNOSIS — I2510 Atherosclerotic heart disease of native coronary artery without angina pectoris: Secondary | ICD-10-CM | POA: Diagnosis not present

## 2020-05-25 MED ORDER — METOPROLOL SUCCINATE ER 25 MG PO TB24: 12.5000 mg | ORAL_TABLET | Freq: Two times a day (BID) | ORAL | 3 refills | Status: DC

## 2020-05-25 NOTE — Telephone Encounter (Signed)
Please decrease Toprol-XL to half tablet a day.  Discontinue if follow-up blood pressure and pulse remain low.  Thanks

## 2020-05-25 NOTE — Telephone Encounter (Signed)
Called [name] there was no answer LMOMw/MD response.Marland KitchenJohny Chess

## 2020-05-26 DIAGNOSIS — I10 Essential (primary) hypertension: Secondary | ICD-10-CM | POA: Diagnosis not present

## 2020-05-26 DIAGNOSIS — I739 Peripheral vascular disease, unspecified: Secondary | ICD-10-CM | POA: Diagnosis not present

## 2020-05-26 DIAGNOSIS — I350 Nonrheumatic aortic (valve) stenosis: Secondary | ICD-10-CM | POA: Diagnosis not present

## 2020-05-26 DIAGNOSIS — E785 Hyperlipidemia, unspecified: Secondary | ICD-10-CM | POA: Diagnosis not present

## 2020-05-26 DIAGNOSIS — I2510 Atherosclerotic heart disease of native coronary artery without angina pectoris: Secondary | ICD-10-CM | POA: Diagnosis not present

## 2020-05-26 DIAGNOSIS — I351 Nonrheumatic aortic (valve) insufficiency: Secondary | ICD-10-CM | POA: Diagnosis not present

## 2020-05-27 DIAGNOSIS — I10 Essential (primary) hypertension: Secondary | ICD-10-CM | POA: Diagnosis not present

## 2020-05-27 DIAGNOSIS — I351 Nonrheumatic aortic (valve) insufficiency: Secondary | ICD-10-CM | POA: Diagnosis not present

## 2020-05-27 DIAGNOSIS — E785 Hyperlipidemia, unspecified: Secondary | ICD-10-CM | POA: Diagnosis not present

## 2020-05-27 DIAGNOSIS — I350 Nonrheumatic aortic (valve) stenosis: Secondary | ICD-10-CM | POA: Diagnosis not present

## 2020-05-27 DIAGNOSIS — I2510 Atherosclerotic heart disease of native coronary artery without angina pectoris: Secondary | ICD-10-CM | POA: Diagnosis not present

## 2020-05-27 DIAGNOSIS — I739 Peripheral vascular disease, unspecified: Secondary | ICD-10-CM | POA: Diagnosis not present

## 2020-05-30 DIAGNOSIS — I350 Nonrheumatic aortic (valve) stenosis: Secondary | ICD-10-CM | POA: Diagnosis not present

## 2020-05-30 DIAGNOSIS — I739 Peripheral vascular disease, unspecified: Secondary | ICD-10-CM | POA: Diagnosis not present

## 2020-05-30 DIAGNOSIS — I2510 Atherosclerotic heart disease of native coronary artery without angina pectoris: Secondary | ICD-10-CM | POA: Diagnosis not present

## 2020-05-30 DIAGNOSIS — I10 Essential (primary) hypertension: Secondary | ICD-10-CM | POA: Diagnosis not present

## 2020-05-30 DIAGNOSIS — E785 Hyperlipidemia, unspecified: Secondary | ICD-10-CM | POA: Diagnosis not present

## 2020-05-30 DIAGNOSIS — I351 Nonrheumatic aortic (valve) insufficiency: Secondary | ICD-10-CM | POA: Diagnosis not present

## 2020-05-31 DIAGNOSIS — I739 Peripheral vascular disease, unspecified: Secondary | ICD-10-CM | POA: Diagnosis not present

## 2020-05-31 DIAGNOSIS — E785 Hyperlipidemia, unspecified: Secondary | ICD-10-CM | POA: Diagnosis not present

## 2020-05-31 DIAGNOSIS — I351 Nonrheumatic aortic (valve) insufficiency: Secondary | ICD-10-CM | POA: Diagnosis not present

## 2020-05-31 DIAGNOSIS — I10 Essential (primary) hypertension: Secondary | ICD-10-CM | POA: Diagnosis not present

## 2020-05-31 DIAGNOSIS — I2510 Atherosclerotic heart disease of native coronary artery without angina pectoris: Secondary | ICD-10-CM | POA: Diagnosis not present

## 2020-05-31 DIAGNOSIS — I350 Nonrheumatic aortic (valve) stenosis: Secondary | ICD-10-CM | POA: Diagnosis not present

## 2020-05-31 NOTE — Telephone Encounter (Signed)
[name] [name] from [facility] called   The patient's daughter decided to stop giving the  metoprolol succinate (TOPROL-XL) 25 MG 24 hr tablet. His heart rate has been in the lower 50's for the past few days and his BP has lowered. Patient is also having some restlessness so she is going to use the standing order for the Lorazepam.   [name] [name]- [phone]

## 2020-06-01 DIAGNOSIS — I350 Nonrheumatic aortic (valve) stenosis: Secondary | ICD-10-CM | POA: Diagnosis not present

## 2020-06-01 DIAGNOSIS — I10 Essential (primary) hypertension: Secondary | ICD-10-CM | POA: Diagnosis not present

## 2020-06-01 DIAGNOSIS — I739 Peripheral vascular disease, unspecified: Secondary | ICD-10-CM | POA: Diagnosis not present

## 2020-06-01 DIAGNOSIS — I351 Nonrheumatic aortic (valve) insufficiency: Secondary | ICD-10-CM | POA: Diagnosis not present

## 2020-06-01 DIAGNOSIS — I2510 Atherosclerotic heart disease of native coronary artery without angina pectoris: Secondary | ICD-10-CM | POA: Diagnosis not present

## 2020-06-01 DIAGNOSIS — E785 Hyperlipidemia, unspecified: Secondary | ICD-10-CM | POA: Diagnosis not present

## 2020-06-02 NOTE — Addendum Note (Signed)
Addended by: Cassandria Anger on: 06/02/2020 07:26 AM   Modules accepted: Orders

## 2020-06-02 NOTE — Telephone Encounter (Signed)
Okay. Thank you.

## 2020-06-03 DIAGNOSIS — I350 Nonrheumatic aortic (valve) stenosis: Secondary | ICD-10-CM | POA: Diagnosis not present

## 2020-06-03 DIAGNOSIS — I10 Essential (primary) hypertension: Secondary | ICD-10-CM | POA: Diagnosis not present

## 2020-06-03 DIAGNOSIS — E785 Hyperlipidemia, unspecified: Secondary | ICD-10-CM | POA: Diagnosis not present

## 2020-06-03 DIAGNOSIS — I739 Peripheral vascular disease, unspecified: Secondary | ICD-10-CM | POA: Diagnosis not present

## 2020-06-03 DIAGNOSIS — I351 Nonrheumatic aortic (valve) insufficiency: Secondary | ICD-10-CM | POA: Diagnosis not present

## 2020-06-03 DIAGNOSIS — I2510 Atherosclerotic heart disease of native coronary artery without angina pectoris: Secondary | ICD-10-CM | POA: Diagnosis not present

## 2020-06-06 DIAGNOSIS — I739 Peripheral vascular disease, unspecified: Secondary | ICD-10-CM | POA: Diagnosis not present

## 2020-06-06 DIAGNOSIS — I350 Nonrheumatic aortic (valve) stenosis: Secondary | ICD-10-CM | POA: Diagnosis not present

## 2020-06-06 DIAGNOSIS — E785 Hyperlipidemia, unspecified: Secondary | ICD-10-CM | POA: Diagnosis not present

## 2020-06-06 DIAGNOSIS — I10 Essential (primary) hypertension: Secondary | ICD-10-CM | POA: Diagnosis not present

## 2020-06-06 DIAGNOSIS — I351 Nonrheumatic aortic (valve) insufficiency: Secondary | ICD-10-CM | POA: Diagnosis not present

## 2020-06-06 DIAGNOSIS — I2510 Atherosclerotic heart disease of native coronary artery without angina pectoris: Secondary | ICD-10-CM | POA: Diagnosis not present

## 2020-06-07 DIAGNOSIS — I351 Nonrheumatic aortic (valve) insufficiency: Secondary | ICD-10-CM | POA: Diagnosis not present

## 2020-06-07 DIAGNOSIS — E785 Hyperlipidemia, unspecified: Secondary | ICD-10-CM | POA: Diagnosis not present

## 2020-06-07 DIAGNOSIS — I10 Essential (primary) hypertension: Secondary | ICD-10-CM | POA: Diagnosis not present

## 2020-06-07 DIAGNOSIS — I739 Peripheral vascular disease, unspecified: Secondary | ICD-10-CM | POA: Diagnosis not present

## 2020-06-07 DIAGNOSIS — I2510 Atherosclerotic heart disease of native coronary artery without angina pectoris: Secondary | ICD-10-CM | POA: Diagnosis not present

## 2020-06-07 DIAGNOSIS — I350 Nonrheumatic aortic (valve) stenosis: Secondary | ICD-10-CM | POA: Diagnosis not present

## 2020-06-08 ENCOUNTER — Encounter: Payer: Self-pay | Admitting: Family

## 2020-06-08 ENCOUNTER — Ambulatory Visit (INDEPENDENT_AMBULATORY_CARE_PROVIDER_SITE_OTHER): Payer: Medicare Other | Admitting: Family

## 2020-06-08 ENCOUNTER — Other Ambulatory Visit: Payer: Self-pay

## 2020-06-08 VITALS — BP 134/60 | HR 60 | Temp 98.7°F | Resp 16 | Ht 68.0 in | Wt 138.6 lb

## 2020-06-08 DIAGNOSIS — I130 Hypertensive heart and chronic kidney disease with heart failure and stage 1 through stage 4 chronic kidney disease, or unspecified chronic kidney disease: Secondary | ICD-10-CM | POA: Diagnosis not present

## 2020-06-08 DIAGNOSIS — D649 Anemia, unspecified: Secondary | ICD-10-CM | POA: Diagnosis not present

## 2020-06-08 DIAGNOSIS — I2510 Atherosclerotic heart disease of native coronary artery without angina pectoris: Secondary | ICD-10-CM | POA: Diagnosis not present

## 2020-06-08 DIAGNOSIS — G4700 Insomnia, unspecified: Secondary | ICD-10-CM | POA: Diagnosis not present

## 2020-06-08 DIAGNOSIS — I739 Peripheral vascular disease, unspecified: Secondary | ICD-10-CM

## 2020-06-08 DIAGNOSIS — Z87891 Personal history of nicotine dependence: Secondary | ICD-10-CM

## 2020-06-08 DIAGNOSIS — K219 Gastro-esophageal reflux disease without esophagitis: Secondary | ICD-10-CM | POA: Diagnosis not present

## 2020-06-08 DIAGNOSIS — I350 Nonrheumatic aortic (valve) stenosis: Secondary | ICD-10-CM | POA: Diagnosis not present

## 2020-06-08 DIAGNOSIS — I10 Essential (primary) hypertension: Secondary | ICD-10-CM | POA: Diagnosis not present

## 2020-06-08 DIAGNOSIS — E785 Hyperlipidemia, unspecified: Secondary | ICD-10-CM

## 2020-06-08 DIAGNOSIS — I5032 Chronic diastolic (congestive) heart failure: Secondary | ICD-10-CM

## 2020-06-08 DIAGNOSIS — G301 Alzheimer's disease with late onset: Secondary | ICD-10-CM | POA: Diagnosis not present

## 2020-06-08 DIAGNOSIS — R634 Abnormal weight loss: Secondary | ICD-10-CM

## 2020-06-08 DIAGNOSIS — M159 Polyosteoarthritis, unspecified: Secondary | ICD-10-CM | POA: Diagnosis not present

## 2020-06-08 DIAGNOSIS — I482 Chronic atrial fibrillation, unspecified: Secondary | ICD-10-CM

## 2020-06-08 DIAGNOSIS — I351 Nonrheumatic aortic (valve) insufficiency: Secondary | ICD-10-CM | POA: Diagnosis not present

## 2020-06-08 DIAGNOSIS — R2681 Unsteadiness on feet: Secondary | ICD-10-CM | POA: Diagnosis not present

## 2020-06-08 DIAGNOSIS — F0280 Dementia in other diseases classified elsewhere without behavioral disturbance: Secondary | ICD-10-CM

## 2020-06-08 MED ORDER — MELATONIN 3 MG PO TABS: 3.0000 mg | ORAL_TABLET | Freq: Every evening | ORAL | 3 refills | Status: DC | PRN

## 2020-06-08 MED ORDER — ACETAMINOPHEN 500 MG PO TABS
500.0000 mg | ORAL_TABLET | Freq: Four times a day (QID) | ORAL | 0 refills | Status: AC | PRN
Start: 2020-06-08 — End: ?

## 2020-06-08 NOTE — Progress Notes (Signed)
Provider: Marlowe Sax FNP-C   Bassam Dresch, Nelda Bucks, NP  Patient Care Team: Diani Jillson, Nelda Bucks, NP as PCP - General (Family Medicine) Cameron Sprang, MD as Consulting Physician (Neurology) O'Neal, Cassie Freer, MD as Consulting Physician (Internal Medicine)  Extended Emergency Contact Information Primary Emergency Contact: Ardelle Park of [place] Phone: [phone] Relation: Daughter Secondary Emergency Contact: Mellette of [place] Mobile Phone: [phone] Relation: Daughter  Code Status: DNR  Goals of care: Advanced Directive information Advanced Directives 06/08/2020  Does Patient Have a Medical Advance Directive? Yes  Type of Advance Directive Out of facility DNR (pink MOST or yellow form)  Does patient want to make changes to medical advance directive? No - Patient declined  Would patient like information on creating a medical advance directive? -  Pre-existing out of facility DNR order (yellow form or pink MOST form) -     Chief Complaint  Patient presents with  . Establish Care    New Patient.    HPI:  Pt is a 87 y.o. male seen today to establish Care for medical management of chronic diseases.He is here with daughter who is the [place] of attorney.Daughter provided most of the HPI patient falls asleep on and off during visit.He has significant medical history of Hypertension, chronic diastolic Heart Failure,CAD status post CABG 05/07/2020,Severe Aortic Stenosis,Hyperlipidemia,Carotid Artery disease,Persistent Afib off anticoagulant due to advance age and concerns for GI bleed,Peripheral Artery disease, Depression,Dementia without behavioral disturbance among others. He follows with Cardiologist Dr.O'neal [place] Bells for severe Aortic stenosis,CHF,CAD,PAD, Carotid artery stenosis last seen 05/12/2020.Not a candidate for TAVR due to advance dementia and concerns for GI bleed focus is comfort care measures follows up with  [facility] service in [place].Has a Hospice CNA three times per week and Nurse twice per week. Daughter assist with his activity of daily Living she moved in to assist since 2017.Takes turn with other siblings. Gait is unsteady.Not using a cane.would like physical therapy for balance issues.Daughter states weather has been cold not able to go out for a walk with the Dad as they usually do.No fall episode reported.   Has had progressive weight loss per daughter he used to weigh in the 150's now down to 138.6 lbs today.Aware that diuretic could be contributory factor and dementia too.Appetite described as good.eats three meals and snacks.   Has a history of smoking cigarettes 1/2 pack per day he quit at age 23 yrs.   Has code status paper work at home including DNR order per daughter.she will bring DNR order then will up date on Epic.unclear if he has a MOST form.will complete on next visit once she verify and brings paperwork    Depression - states depressed sometimes.previously on antidepressant but daughter states took him off.  Has seen Opthalmology recently Dr.Roy Whitaker [phone] number given to call for records.CMA notified to obtain records.   Past Medical History:  Diagnosis Date  . Anxiety   . Aortic stenosis   . Arthritis   . Carotid artery disease ([place])   . Coronary artery disease    sees Dr. Verl Blalock  . Depression   . DJD (degenerative joint disease)    right hip  . GERD (gastroesophageal reflux disease)    hx of  . Glaucoma   . Glucose intolerance (impaired glucose tolerance)   . Headache(784.0)    hx of migraines  . Herpes zoster 2009   r. scalp and opth  . Hx of colonic  polyps 2004   hyperplastic  . Hyperlipidemia   . Hypertension    sees Dr. Adelene Amas  . Internal and external hemorrhoids without complication   . Low back pain    facet arthropathy  . Lumbago   . Peripheral vascular disease ([place])   . Pneumonia 2009   rll with a small effusion  .  Postherpetic neuralgia 2009   Past Surgical History:  Procedure Laterality Date  . ACNE CYST REMOVAL  1970's   removed x2 off the back  . CARPAL TUNNEL RELEASE     left  . CATARACT EXTRACTION W/PHACO  02/13/2012   Procedure: CATARACT EXTRACTION PHACO AND INTRAOCULAR LENS PLACEMENT (IOC);  Surgeon: Adonis Brook, MD;  Location: [facility];  Service: Ophthalmology;  Laterality: Left;  . COLONOSCOPY    . CORONARY ARTERY BYPASS GRAFT  2002  . REPOSITION OF LENS  03/14/2012   Procedure: REPOSITION OF LENS;  Surgeon: Adonis Brook, MD;  Location: [facility];  Service: Ophthalmology;  Laterality: Left;  Reposition Ocular Lens Left Eye  . thumb tendon surgery     left  . TONSILLECTOMY     age 29    Allergies  Allergen Reactions  . Atorvastatin Other (See Comments)    Muscle aches Muscle aches Muscle aches  . Benazepril Hcl Cough  . Cosopt [Dorzolamide Hcl-Timolol Mal] Other (See Comments)    Eyes water  . Ezetimibe-Simvastatin Other (See Comments)    REACTION: aches REACTION: aches REACTION: aches  . Aricept [Donepezil Hcl]     hallucinations  . Norvasc [Amlodipine Besylate]     swelling  . Zoloft [Sertraline]     Allergies as of 06/08/2020      Reactions   Atorvastatin Other (See Comments)   Muscle aches Muscle aches Muscle aches   Benazepril Hcl Cough   Cosopt [dorzolamide Hcl-timolol Mal] Other (See Comments)   Eyes water   Ezetimibe-simvastatin Other (See Comments)   REACTION: aches REACTION: aches REACTION: aches   Aricept [donepezil Hcl]    hallucinations   Norvasc [amlodipine Besylate]    swelling   Zoloft [sertraline]       Medication List       Accurate as of June 08, 2020  2:09 PM. If you have any questions, ask your nurse or doctor.        STOP taking these medications   doxycycline 100 MG capsule Commonly known as: VIBRAMYCIN Stopped by: Otis Peak, CMA   pantoprazole 20 MG tablet Commonly known as: PROTONIX Stopped by: Sandrea Hughs, NP      TAKE these medications   ferrous sulfate 325 (65 FE) MG tablet Take 325 mg by mouth daily with breakfast.   furosemide 20 MG tablet Commonly known as: Lasix Take 1 tablet (20 mg total) by mouth daily.   gabapentin 100 MG capsule Commonly known as: NEURONTIN Take 100-300 mg by mouth as needed.   Travatan Z 0.004 % Soln ophthalmic solution Generic drug: Travoprost (BAK Free) Place 1 drop into both eyes daily.   Vitamin D3 50 MCG (2000 UT) capsule Take 1 capsule (2,000 Units total) by mouth daily.       Review of Systems  Constitutional: Negative for appetite change, chills, fatigue and fever.  HENT: Negative for congestion, dental problem, hearing loss, rhinorrhea, sinus pressure, sinus pain, sneezing and sore throat.   Eyes: Negative for discharge, redness and itching.       Follows up with Dr.Roy Whitaker   Respiratory: Negative for cough, chest tightness,  shortness of breath and wheezing.   Cardiovascular: Positive for leg swelling. Negative for chest pain.       Afib   Gastrointestinal: Negative for abdominal distention, abdominal pain, blood in stool, diarrhea, nausea and vomiting.       Once in a while has constipation  Endocrine: Negative for cold intolerance, heat intolerance, polydipsia, polyphagia and polyuria.  Genitourinary:       Wears incontinent depends but makes it to the bathroom   Musculoskeletal: Negative for arthralgias, gait problem, joint swelling and myalgias.  Skin: Negative for color change, pallor and rash.  Neurological: Negative for dizziness, speech difficulty, weakness, light-headedness, numbness and headaches.  Hematological: Does not bruise/bleed easily.  Psychiatric/Behavioral: Negative for agitation, behavioral problems and sleep disturbance. The patient is not nervous/anxious.     Immunization History  Administered Date(s) Administered  . Influenza, High Dose Seasonal PF 01/16/2017  . Janssen (J&J) SARS-COV-2 Vaccination 09/18/2019   . Pneumococcal Conjugate-13 07/14/2014  . Pneumococcal Polysaccharide-23 08/17/2015  . Td 10/03/2009   Pertinent  Health Maintenance Due  Topic Date Due  . OPHTHALMOLOGY EXAM  Never done  . URINE MICROALBUMIN  Never done  . INFLUENZA VACCINE  11/15/2019  . FOOT EXAM  04/26/2021  . PNA vac Low Risk Adult  Completed   Fall Risk  06/08/2020 05/14/2019 10/23/2018 07/23/2017 06/29/2016  Falls in the past year? 0 0 0 Yes -  Number falls in past yr: 0 0 0 1 -  Injury with Fall? 0 0 0 Yes No  Risk for fall due to : - - - - -  Follow up - - Falls evaluation completed - -   Functional Status Survey:    Vitals:   06/08/20 1342  BP: 134/60  Pulse: 60  Resp: 16  Temp: 98.7 F (37.1 C)  SpO2: 98%  Weight: 138 lb 9.6 oz (62.9 kg)  Height: 5' 8" (1.727 m)   Body mass index is 21.07 kg/m. Physical Exam Vitals reviewed.  Constitutional:      General: He is not in acute distress.    Appearance: He is normal weight. He is not ill-appearing.  HENT:     Head: Normocephalic.     Right Ear: Tympanic membrane, ear canal and external ear normal. There is no impacted cerumen.     Left Ear: Tympanic membrane, ear canal and external ear normal. There is no impacted cerumen.     Nose: Nose normal. No congestion or rhinorrhea.     Mouth/Throat:     Mouth: Mucous membranes are moist.     Pharynx: Oropharynx is clear. No oropharyngeal exudate or posterior oropharyngeal erythema.  Eyes:     General: No scleral icterus.       Right eye: No discharge.        Left eye: No discharge.     Extraocular Movements: Extraocular movements intact.     Conjunctiva/sclera: Conjunctivae normal.     Pupils: Pupils are equal, round, and reactive to light.  Cardiovascular:     Rate and Rhythm: Normal rate and regular rhythm.     Pulses: Normal pulses.     Heart sounds: Murmur heard.   Systolic murmur is present. No friction rub. No gallop.   Pulmonary:     Effort: Pulmonary effort is normal. No respiratory  distress.     Breath sounds: Normal breath sounds. No wheezing, rhonchi or rales.  Chest:     Chest wall: No tenderness.  Abdominal:     General: Bowel  sounds are normal. There is no distension.     Palpations: Abdomen is soft. There is no mass.     Tenderness: There is no abdominal tenderness. There is no right CVA tenderness, left CVA tenderness, guarding or rebound.  Musculoskeletal:        General: No swelling or tenderness.     Cervical back: Normal range of motion. No rigidity or tenderness.     Right lower leg: No edema.     Left lower leg: No edema.     Comments: Unsteady gait  Lymphadenopathy:     Cervical: No cervical adenopathy.  Skin:    General: Skin is warm and dry.     Coloration: Skin is not pale.     Findings: No bruising, erythema or rash.     Comments: Bilateral lower 3rd areas extremities brownish skin color   Neurological:     Mental Status: He is alert. Mental status is at baseline.     Cranial Nerves: No cranial nerve deficit.     Sensory: No sensory deficit.     Motor: No weakness.     Coordination: Coordination normal.     Gait: Gait abnormal.  Psychiatric:        Mood and Affect: Mood normal.        Speech: Speech normal.        Behavior: Behavior normal.        Thought Content: Thought content normal.        Cognition and Memory: Memory is impaired. He exhibits impaired recent memory.    Labs reviewed: Recent Labs    08/04/19 0957 04/19/20 1641  NA 140 140  K 4.3 4.4  CL 105 109  CO2 28 24  GLUCOSE 58* 70  BUN 21 19  CREATININE 1.01 1.13  CALCIUM 9.6 9.2   Recent Labs    04/19/20 1641  AST 23  ALT 12  ALKPHOS 86  BILITOT 0.7  PROT 6.2  ALBUMIN 3.9   Recent Labs    08/04/19 0957 04/19/20 1641  WBC 7.0 7.2  NEUTROABS 4.4 4.8  HGB 12.2* 10.7*  HCT 37.4* 35.8*  MCV 83.6 70.2*  PLT 202.0 221.0   Lab Results  Component Value Date   TSH 1.29 08/04/2019   Lab Results  Component Value Date   HGBA1C 5.6 12/09/2013   Lab  Results  Component Value Date   CHOL 273 (H) 04/28/2019   HDL 70.10 04/28/2019   LDLCALC 192 (H) 04/28/2019   LDLDIRECT 136.2 09/28/2009   TRIG 54.0 04/28/2019   CHOLHDL 4 04/28/2019    Significant Diagnostic Results in last 30 days:  No results found.  Assessment/Plan 1. Benign hypertensive heart and renal disease with renal failure, stage 1 through stage 4 or unspecified chronic kidney disease, with heart failure (HCC) B/p well controlled.off med except Furosemide 20 mg tablet daily.  - CBC with Differential/Platelet; Future - CMP with eGFR([facility]); Future - TSH; Future - Ambulatory referral to Home Health  2. Aortic valve stenosis, etiology of cardiac valve disease unspecified Not a candidate for TAVR due to advance dementia and concerns for GI bleed focus is comfort care measures follows up with [facility] service in [place].Has a Hospice. - continue to follow up with Cardiologist as directed  - CBC with Differential/Platelet; Future - Ambulatory referral to Home Health  3. Peripheral arterial disease (HCC) No ulceration noted.Bilateral lower 3rd of legs brownish skin discoloration.  - CBC with Differential/Platelet; Future - Ambulatory referral to  Home Health  4. Hyperlipidemia LDL goal <100 Current LDL not at goal. Intolerance to statin  - Recommended a low carbohydrates,low saturated fats and high vegetables diet  - Lipid panel; Future - Ambulatory referral to Home Health  5. Unsteady gait Fall risk - encouraged to use a cane for stabilization.HH PT to evaluate gait,exercise and muscle strengthening  - Ambulatory referral to Home Health  6. Weight loss Has had progressive weight loss.Appetite described as good.possible dementia and diuretic related  - Advised to monitor weight at home and notify provider for worsening weight loss.   7. Osteoarthritis of multiple joints, unspecified osteoarthritis type Lower back pain  - acetaminophen (TYLENOL) 500  MG tablet; Take 1 tablet (500 mg total) by mouth every 6 (six) hours as needed.  Dispense: 30 tablet; Refill: 0  8. Insomnia, unspecified type Advised to uses Melatonin 3-6 mg tablet at bedtime.  - melatonin 3 MG TABS tablet; Take 1 tablet (3 mg total) by mouth at bedtime as needed.  Dispense: 30 tablet; Refill: 3  9. Late onset Alzheimer's dementia without behavioral disturbance ([place]) - No behavioral issues noted.  - continue with supportive care  - continue on Hospice service   10. Atherosclerosis of native coronary artery of native heart without angina pectoris Chest pain free - off anticoagulant due to advance age and concerns for GI bleed - Statin intolerance  - continue to follow up with cardiology  - continue on Hospice service   11. Gastroesophageal reflux disease without esophagitis Symptoms controlled  - continue to monitor H/H  - off pantoprazole  12. Chronic anemia Latest hgb 10.7 previous 12.2; 12.1  - continue on Ferrous 325 mg tablet daily with breakfast  CBC/diff,future   13. Hx of smoking Smoked 1/2 pack cigarettes quit at age 57 yrs old. - No breathing issues  14. Chronic diastolic heart failure (HCC) No signs of fluid overload. - advised to monitor weight daily at home. Consider reducing Furosemide from 20 mg to 10 mg tablet if no signs of fluid overload.  15. Chronic a-fib ([place]) Off anticoagulant due to advance age and anemia HR regular. - continue to monitor    Family/ staff Communication: Reviewed plan of care with patient and daughter   Labs/tests ordered:  - CBC with Differential/Platelet; Future - CMP with eGFR([facility]); Future - TSH; Future - Lipid panel; Future  Next Appointment : 4 months for medical management of chronic issues.Fasting labs prior to visit  Sandrea Hughs, NP

## 2020-06-08 NOTE — Patient Instructions (Signed)
Take Extra tylenol 500 mg tablet one by mouth every 6 hrs as needed for pain  - May Take melatonin 3-6 mg tablet daily at bedtime as needed for sleep

## 2020-06-10 DIAGNOSIS — I350 Nonrheumatic aortic (valve) stenosis: Secondary | ICD-10-CM | POA: Diagnosis not present

## 2020-06-10 DIAGNOSIS — I739 Peripheral vascular disease, unspecified: Secondary | ICD-10-CM | POA: Diagnosis not present

## 2020-06-10 DIAGNOSIS — I351 Nonrheumatic aortic (valve) insufficiency: Secondary | ICD-10-CM | POA: Diagnosis not present

## 2020-06-10 DIAGNOSIS — E785 Hyperlipidemia, unspecified: Secondary | ICD-10-CM | POA: Diagnosis not present

## 2020-06-10 DIAGNOSIS — I10 Essential (primary) hypertension: Secondary | ICD-10-CM | POA: Diagnosis not present

## 2020-06-10 DIAGNOSIS — I2510 Atherosclerotic heart disease of native coronary artery without angina pectoris: Secondary | ICD-10-CM | POA: Diagnosis not present

## 2020-06-13 DIAGNOSIS — I10 Essential (primary) hypertension: Secondary | ICD-10-CM | POA: Diagnosis not present

## 2020-06-13 DIAGNOSIS — I2510 Atherosclerotic heart disease of native coronary artery without angina pectoris: Secondary | ICD-10-CM | POA: Diagnosis not present

## 2020-06-13 DIAGNOSIS — I739 Peripheral vascular disease, unspecified: Secondary | ICD-10-CM | POA: Diagnosis not present

## 2020-06-13 DIAGNOSIS — I350 Nonrheumatic aortic (valve) stenosis: Secondary | ICD-10-CM | POA: Diagnosis not present

## 2020-06-13 DIAGNOSIS — I351 Nonrheumatic aortic (valve) insufficiency: Secondary | ICD-10-CM | POA: Diagnosis not present

## 2020-06-13 DIAGNOSIS — E785 Hyperlipidemia, unspecified: Secondary | ICD-10-CM | POA: Diagnosis not present

## 2020-06-14 DIAGNOSIS — I2510 Atherosclerotic heart disease of native coronary artery without angina pectoris: Secondary | ICD-10-CM | POA: Diagnosis not present

## 2020-06-14 DIAGNOSIS — M1990 Unspecified osteoarthritis, unspecified site: Secondary | ICD-10-CM | POA: Diagnosis not present

## 2020-06-14 DIAGNOSIS — I350 Nonrheumatic aortic (valve) stenosis: Secondary | ICD-10-CM | POA: Diagnosis not present

## 2020-06-14 DIAGNOSIS — I351 Nonrheumatic aortic (valve) insufficiency: Secondary | ICD-10-CM | POA: Diagnosis not present

## 2020-06-14 DIAGNOSIS — E785 Hyperlipidemia, unspecified: Secondary | ICD-10-CM | POA: Diagnosis not present

## 2020-06-14 DIAGNOSIS — F419 Anxiety disorder, unspecified: Secondary | ICD-10-CM | POA: Diagnosis not present

## 2020-06-14 DIAGNOSIS — H409 Unspecified glaucoma: Secondary | ICD-10-CM | POA: Diagnosis not present

## 2020-06-14 DIAGNOSIS — D649 Anemia, unspecified: Secondary | ICD-10-CM | POA: Diagnosis not present

## 2020-06-14 DIAGNOSIS — F0390 Unspecified dementia without behavioral disturbance: Secondary | ICD-10-CM | POA: Diagnosis not present

## 2020-06-14 DIAGNOSIS — K219 Gastro-esophageal reflux disease without esophagitis: Secondary | ICD-10-CM | POA: Diagnosis not present

## 2020-06-14 DIAGNOSIS — I10 Essential (primary) hypertension: Secondary | ICD-10-CM | POA: Diagnosis not present

## 2020-06-14 DIAGNOSIS — I739 Peripheral vascular disease, unspecified: Secondary | ICD-10-CM | POA: Diagnosis not present

## 2020-06-15 DIAGNOSIS — I350 Nonrheumatic aortic (valve) stenosis: Secondary | ICD-10-CM | POA: Diagnosis not present

## 2020-06-15 DIAGNOSIS — I351 Nonrheumatic aortic (valve) insufficiency: Secondary | ICD-10-CM | POA: Diagnosis not present

## 2020-06-15 DIAGNOSIS — I739 Peripheral vascular disease, unspecified: Secondary | ICD-10-CM | POA: Diagnosis not present

## 2020-06-15 DIAGNOSIS — I10 Essential (primary) hypertension: Secondary | ICD-10-CM | POA: Diagnosis not present

## 2020-06-15 DIAGNOSIS — E785 Hyperlipidemia, unspecified: Secondary | ICD-10-CM | POA: Diagnosis not present

## 2020-06-15 DIAGNOSIS — I2510 Atherosclerotic heart disease of native coronary artery without angina pectoris: Secondary | ICD-10-CM | POA: Diagnosis not present

## 2020-06-16 ENCOUNTER — Telehealth: Payer: Self-pay

## 2020-06-16 NOTE — Telephone Encounter (Signed)
Jerline Pain the case manager from [facility] called and states that patient daughter likes  you as patient NP, and wanted to know if you could be attending physician for patient. Message routed to PCP Ngetich, Nelda Bucks, NP . Please Advise.

## 2020-06-16 NOTE — Telephone Encounter (Signed)
Called and notified Hospice.

## 2020-06-16 NOTE — Telephone Encounter (Signed)
I will be delighted to continue to be his PCP and help in anyway possible.

## 2020-06-17 DIAGNOSIS — I739 Peripheral vascular disease, unspecified: Secondary | ICD-10-CM | POA: Diagnosis not present

## 2020-06-17 DIAGNOSIS — I350 Nonrheumatic aortic (valve) stenosis: Secondary | ICD-10-CM | POA: Diagnosis not present

## 2020-06-17 DIAGNOSIS — I2510 Atherosclerotic heart disease of native coronary artery without angina pectoris: Secondary | ICD-10-CM | POA: Diagnosis not present

## 2020-06-17 DIAGNOSIS — I351 Nonrheumatic aortic (valve) insufficiency: Secondary | ICD-10-CM | POA: Diagnosis not present

## 2020-06-17 DIAGNOSIS — E785 Hyperlipidemia, unspecified: Secondary | ICD-10-CM | POA: Diagnosis not present

## 2020-06-17 DIAGNOSIS — I10 Essential (primary) hypertension: Secondary | ICD-10-CM | POA: Diagnosis not present

## 2020-06-20 DIAGNOSIS — I351 Nonrheumatic aortic (valve) insufficiency: Secondary | ICD-10-CM | POA: Diagnosis not present

## 2020-06-20 DIAGNOSIS — I2510 Atherosclerotic heart disease of native coronary artery without angina pectoris: Secondary | ICD-10-CM | POA: Diagnosis not present

## 2020-06-20 DIAGNOSIS — E785 Hyperlipidemia, unspecified: Secondary | ICD-10-CM | POA: Diagnosis not present

## 2020-06-20 DIAGNOSIS — I10 Essential (primary) hypertension: Secondary | ICD-10-CM | POA: Diagnosis not present

## 2020-06-20 DIAGNOSIS — I739 Peripheral vascular disease, unspecified: Secondary | ICD-10-CM | POA: Diagnosis not present

## 2020-06-20 DIAGNOSIS — I350 Nonrheumatic aortic (valve) stenosis: Secondary | ICD-10-CM | POA: Diagnosis not present

## 2020-06-21 DIAGNOSIS — E785 Hyperlipidemia, unspecified: Secondary | ICD-10-CM | POA: Diagnosis not present

## 2020-06-21 DIAGNOSIS — I10 Essential (primary) hypertension: Secondary | ICD-10-CM | POA: Diagnosis not present

## 2020-06-21 DIAGNOSIS — I351 Nonrheumatic aortic (valve) insufficiency: Secondary | ICD-10-CM | POA: Diagnosis not present

## 2020-06-21 DIAGNOSIS — I2510 Atherosclerotic heart disease of native coronary artery without angina pectoris: Secondary | ICD-10-CM | POA: Diagnosis not present

## 2020-06-21 DIAGNOSIS — I739 Peripheral vascular disease, unspecified: Secondary | ICD-10-CM | POA: Diagnosis not present

## 2020-06-21 DIAGNOSIS — I350 Nonrheumatic aortic (valve) stenosis: Secondary | ICD-10-CM | POA: Diagnosis not present

## 2020-06-22 DIAGNOSIS — E785 Hyperlipidemia, unspecified: Secondary | ICD-10-CM | POA: Diagnosis not present

## 2020-06-22 DIAGNOSIS — I10 Essential (primary) hypertension: Secondary | ICD-10-CM | POA: Diagnosis not present

## 2020-06-22 DIAGNOSIS — I351 Nonrheumatic aortic (valve) insufficiency: Secondary | ICD-10-CM | POA: Diagnosis not present

## 2020-06-22 DIAGNOSIS — I350 Nonrheumatic aortic (valve) stenosis: Secondary | ICD-10-CM | POA: Diagnosis not present

## 2020-06-22 DIAGNOSIS — I2510 Atherosclerotic heart disease of native coronary artery without angina pectoris: Secondary | ICD-10-CM | POA: Diagnosis not present

## 2020-06-22 DIAGNOSIS — I739 Peripheral vascular disease, unspecified: Secondary | ICD-10-CM | POA: Diagnosis not present

## 2020-06-24 DIAGNOSIS — I2510 Atherosclerotic heart disease of native coronary artery without angina pectoris: Secondary | ICD-10-CM | POA: Diagnosis not present

## 2020-06-24 DIAGNOSIS — I350 Nonrheumatic aortic (valve) stenosis: Secondary | ICD-10-CM | POA: Diagnosis not present

## 2020-06-24 DIAGNOSIS — E785 Hyperlipidemia, unspecified: Secondary | ICD-10-CM | POA: Diagnosis not present

## 2020-06-24 DIAGNOSIS — I10 Essential (primary) hypertension: Secondary | ICD-10-CM | POA: Diagnosis not present

## 2020-06-24 DIAGNOSIS — I351 Nonrheumatic aortic (valve) insufficiency: Secondary | ICD-10-CM | POA: Diagnosis not present

## 2020-06-24 DIAGNOSIS — I739 Peripheral vascular disease, unspecified: Secondary | ICD-10-CM | POA: Diagnosis not present

## 2020-06-27 DIAGNOSIS — I10 Essential (primary) hypertension: Secondary | ICD-10-CM | POA: Diagnosis not present

## 2020-06-27 DIAGNOSIS — I2510 Atherosclerotic heart disease of native coronary artery without angina pectoris: Secondary | ICD-10-CM | POA: Diagnosis not present

## 2020-06-27 DIAGNOSIS — I739 Peripheral vascular disease, unspecified: Secondary | ICD-10-CM | POA: Diagnosis not present

## 2020-06-27 DIAGNOSIS — E785 Hyperlipidemia, unspecified: Secondary | ICD-10-CM | POA: Diagnosis not present

## 2020-06-27 DIAGNOSIS — I350 Nonrheumatic aortic (valve) stenosis: Secondary | ICD-10-CM | POA: Diagnosis not present

## 2020-06-27 DIAGNOSIS — I351 Nonrheumatic aortic (valve) insufficiency: Secondary | ICD-10-CM | POA: Diagnosis not present

## 2020-06-28 DIAGNOSIS — I739 Peripheral vascular disease, unspecified: Secondary | ICD-10-CM | POA: Diagnosis not present

## 2020-06-28 DIAGNOSIS — I2510 Atherosclerotic heart disease of native coronary artery without angina pectoris: Secondary | ICD-10-CM | POA: Diagnosis not present

## 2020-06-28 DIAGNOSIS — I10 Essential (primary) hypertension: Secondary | ICD-10-CM | POA: Diagnosis not present

## 2020-06-28 DIAGNOSIS — I350 Nonrheumatic aortic (valve) stenosis: Secondary | ICD-10-CM | POA: Diagnosis not present

## 2020-06-28 DIAGNOSIS — E785 Hyperlipidemia, unspecified: Secondary | ICD-10-CM | POA: Diagnosis not present

## 2020-06-28 DIAGNOSIS — I351 Nonrheumatic aortic (valve) insufficiency: Secondary | ICD-10-CM | POA: Diagnosis not present

## 2020-06-29 DIAGNOSIS — I2510 Atherosclerotic heart disease of native coronary artery without angina pectoris: Secondary | ICD-10-CM | POA: Diagnosis not present

## 2020-06-29 DIAGNOSIS — I350 Nonrheumatic aortic (valve) stenosis: Secondary | ICD-10-CM | POA: Diagnosis not present

## 2020-06-29 DIAGNOSIS — I739 Peripheral vascular disease, unspecified: Secondary | ICD-10-CM | POA: Diagnosis not present

## 2020-06-29 DIAGNOSIS — I10 Essential (primary) hypertension: Secondary | ICD-10-CM | POA: Diagnosis not present

## 2020-06-29 DIAGNOSIS — E785 Hyperlipidemia, unspecified: Secondary | ICD-10-CM | POA: Diagnosis not present

## 2020-06-29 DIAGNOSIS — I351 Nonrheumatic aortic (valve) insufficiency: Secondary | ICD-10-CM | POA: Diagnosis not present

## 2020-07-01 DIAGNOSIS — I2510 Atherosclerotic heart disease of native coronary artery without angina pectoris: Secondary | ICD-10-CM | POA: Diagnosis not present

## 2020-07-01 DIAGNOSIS — I351 Nonrheumatic aortic (valve) insufficiency: Secondary | ICD-10-CM | POA: Diagnosis not present

## 2020-07-01 DIAGNOSIS — E785 Hyperlipidemia, unspecified: Secondary | ICD-10-CM | POA: Diagnosis not present

## 2020-07-01 DIAGNOSIS — I350 Nonrheumatic aortic (valve) stenosis: Secondary | ICD-10-CM | POA: Diagnosis not present

## 2020-07-01 DIAGNOSIS — I739 Peripheral vascular disease, unspecified: Secondary | ICD-10-CM | POA: Diagnosis not present

## 2020-07-01 DIAGNOSIS — I10 Essential (primary) hypertension: Secondary | ICD-10-CM | POA: Diagnosis not present

## 2020-07-04 DIAGNOSIS — I350 Nonrheumatic aortic (valve) stenosis: Secondary | ICD-10-CM | POA: Diagnosis not present

## 2020-07-04 DIAGNOSIS — I351 Nonrheumatic aortic (valve) insufficiency: Secondary | ICD-10-CM | POA: Diagnosis not present

## 2020-07-04 DIAGNOSIS — I2510 Atherosclerotic heart disease of native coronary artery without angina pectoris: Secondary | ICD-10-CM | POA: Diagnosis not present

## 2020-07-04 DIAGNOSIS — E785 Hyperlipidemia, unspecified: Secondary | ICD-10-CM | POA: Diagnosis not present

## 2020-07-04 DIAGNOSIS — I739 Peripheral vascular disease, unspecified: Secondary | ICD-10-CM | POA: Diagnosis not present

## 2020-07-04 DIAGNOSIS — I10 Essential (primary) hypertension: Secondary | ICD-10-CM | POA: Diagnosis not present

## 2020-07-05 DIAGNOSIS — I2510 Atherosclerotic heart disease of native coronary artery without angina pectoris: Secondary | ICD-10-CM | POA: Diagnosis not present

## 2020-07-05 DIAGNOSIS — I739 Peripheral vascular disease, unspecified: Secondary | ICD-10-CM | POA: Diagnosis not present

## 2020-07-05 DIAGNOSIS — E785 Hyperlipidemia, unspecified: Secondary | ICD-10-CM | POA: Diagnosis not present

## 2020-07-05 DIAGNOSIS — I351 Nonrheumatic aortic (valve) insufficiency: Secondary | ICD-10-CM | POA: Diagnosis not present

## 2020-07-05 DIAGNOSIS — I350 Nonrheumatic aortic (valve) stenosis: Secondary | ICD-10-CM | POA: Diagnosis not present

## 2020-07-05 DIAGNOSIS — I10 Essential (primary) hypertension: Secondary | ICD-10-CM | POA: Diagnosis not present

## 2020-07-06 DIAGNOSIS — E785 Hyperlipidemia, unspecified: Secondary | ICD-10-CM | POA: Diagnosis not present

## 2020-07-06 DIAGNOSIS — I350 Nonrheumatic aortic (valve) stenosis: Secondary | ICD-10-CM | POA: Diagnosis not present

## 2020-07-06 DIAGNOSIS — I351 Nonrheumatic aortic (valve) insufficiency: Secondary | ICD-10-CM | POA: Diagnosis not present

## 2020-07-06 DIAGNOSIS — I739 Peripheral vascular disease, unspecified: Secondary | ICD-10-CM | POA: Diagnosis not present

## 2020-07-06 DIAGNOSIS — I10 Essential (primary) hypertension: Secondary | ICD-10-CM | POA: Diagnosis not present

## 2020-07-06 DIAGNOSIS — I2510 Atherosclerotic heart disease of native coronary artery without angina pectoris: Secondary | ICD-10-CM | POA: Diagnosis not present

## 2020-07-08 DIAGNOSIS — I10 Essential (primary) hypertension: Secondary | ICD-10-CM | POA: Diagnosis not present

## 2020-07-08 DIAGNOSIS — I351 Nonrheumatic aortic (valve) insufficiency: Secondary | ICD-10-CM | POA: Diagnosis not present

## 2020-07-08 DIAGNOSIS — I739 Peripheral vascular disease, unspecified: Secondary | ICD-10-CM | POA: Diagnosis not present

## 2020-07-08 DIAGNOSIS — E785 Hyperlipidemia, unspecified: Secondary | ICD-10-CM | POA: Diagnosis not present

## 2020-07-08 DIAGNOSIS — I2510 Atherosclerotic heart disease of native coronary artery without angina pectoris: Secondary | ICD-10-CM | POA: Diagnosis not present

## 2020-07-08 DIAGNOSIS — I350 Nonrheumatic aortic (valve) stenosis: Secondary | ICD-10-CM | POA: Diagnosis not present

## 2020-07-11 DIAGNOSIS — I10 Essential (primary) hypertension: Secondary | ICD-10-CM | POA: Diagnosis not present

## 2020-07-11 DIAGNOSIS — I2510 Atherosclerotic heart disease of native coronary artery without angina pectoris: Secondary | ICD-10-CM | POA: Diagnosis not present

## 2020-07-11 DIAGNOSIS — E785 Hyperlipidemia, unspecified: Secondary | ICD-10-CM | POA: Diagnosis not present

## 2020-07-11 DIAGNOSIS — I351 Nonrheumatic aortic (valve) insufficiency: Secondary | ICD-10-CM | POA: Diagnosis not present

## 2020-07-11 DIAGNOSIS — I739 Peripheral vascular disease, unspecified: Secondary | ICD-10-CM | POA: Diagnosis not present

## 2020-07-11 DIAGNOSIS — I350 Nonrheumatic aortic (valve) stenosis: Secondary | ICD-10-CM | POA: Diagnosis not present

## 2020-07-13 DIAGNOSIS — I350 Nonrheumatic aortic (valve) stenosis: Secondary | ICD-10-CM | POA: Diagnosis not present

## 2020-07-13 DIAGNOSIS — E785 Hyperlipidemia, unspecified: Secondary | ICD-10-CM | POA: Diagnosis not present

## 2020-07-13 DIAGNOSIS — I351 Nonrheumatic aortic (valve) insufficiency: Secondary | ICD-10-CM | POA: Diagnosis not present

## 2020-07-13 DIAGNOSIS — I2510 Atherosclerotic heart disease of native coronary artery without angina pectoris: Secondary | ICD-10-CM | POA: Diagnosis not present

## 2020-07-13 DIAGNOSIS — I10 Essential (primary) hypertension: Secondary | ICD-10-CM | POA: Diagnosis not present

## 2020-07-13 DIAGNOSIS — I739 Peripheral vascular disease, unspecified: Secondary | ICD-10-CM | POA: Diagnosis not present

## 2020-07-15 DIAGNOSIS — D649 Anemia, unspecified: Secondary | ICD-10-CM | POA: Diagnosis not present

## 2020-07-15 DIAGNOSIS — F419 Anxiety disorder, unspecified: Secondary | ICD-10-CM | POA: Diagnosis not present

## 2020-07-15 DIAGNOSIS — K219 Gastro-esophageal reflux disease without esophagitis: Secondary | ICD-10-CM | POA: Diagnosis not present

## 2020-07-15 DIAGNOSIS — E785 Hyperlipidemia, unspecified: Secondary | ICD-10-CM | POA: Diagnosis not present

## 2020-07-15 DIAGNOSIS — I2510 Atherosclerotic heart disease of native coronary artery without angina pectoris: Secondary | ICD-10-CM | POA: Diagnosis not present

## 2020-07-15 DIAGNOSIS — I10 Essential (primary) hypertension: Secondary | ICD-10-CM | POA: Diagnosis not present

## 2020-07-15 DIAGNOSIS — M1990 Unspecified osteoarthritis, unspecified site: Secondary | ICD-10-CM | POA: Diagnosis not present

## 2020-07-15 DIAGNOSIS — I739 Peripheral vascular disease, unspecified: Secondary | ICD-10-CM | POA: Diagnosis not present

## 2020-07-15 DIAGNOSIS — F0390 Unspecified dementia without behavioral disturbance: Secondary | ICD-10-CM | POA: Diagnosis not present

## 2020-07-15 DIAGNOSIS — I350 Nonrheumatic aortic (valve) stenosis: Secondary | ICD-10-CM | POA: Diagnosis not present

## 2020-07-15 DIAGNOSIS — H409 Unspecified glaucoma: Secondary | ICD-10-CM | POA: Diagnosis not present

## 2020-07-15 DIAGNOSIS — I351 Nonrheumatic aortic (valve) insufficiency: Secondary | ICD-10-CM | POA: Diagnosis not present

## 2020-07-18 DIAGNOSIS — I10 Essential (primary) hypertension: Secondary | ICD-10-CM | POA: Diagnosis not present

## 2020-07-18 DIAGNOSIS — I739 Peripheral vascular disease, unspecified: Secondary | ICD-10-CM | POA: Diagnosis not present

## 2020-07-18 DIAGNOSIS — I351 Nonrheumatic aortic (valve) insufficiency: Secondary | ICD-10-CM | POA: Diagnosis not present

## 2020-07-18 DIAGNOSIS — I350 Nonrheumatic aortic (valve) stenosis: Secondary | ICD-10-CM | POA: Diagnosis not present

## 2020-07-18 DIAGNOSIS — I2510 Atherosclerotic heart disease of native coronary artery without angina pectoris: Secondary | ICD-10-CM | POA: Diagnosis not present

## 2020-07-18 DIAGNOSIS — E785 Hyperlipidemia, unspecified: Secondary | ICD-10-CM | POA: Diagnosis not present

## 2020-07-19 DIAGNOSIS — I350 Nonrheumatic aortic (valve) stenosis: Secondary | ICD-10-CM | POA: Diagnosis not present

## 2020-07-19 DIAGNOSIS — I739 Peripheral vascular disease, unspecified: Secondary | ICD-10-CM | POA: Diagnosis not present

## 2020-07-19 DIAGNOSIS — I10 Essential (primary) hypertension: Secondary | ICD-10-CM | POA: Diagnosis not present

## 2020-07-19 DIAGNOSIS — I2510 Atherosclerotic heart disease of native coronary artery without angina pectoris: Secondary | ICD-10-CM | POA: Diagnosis not present

## 2020-07-19 DIAGNOSIS — E785 Hyperlipidemia, unspecified: Secondary | ICD-10-CM | POA: Diagnosis not present

## 2020-07-19 DIAGNOSIS — I351 Nonrheumatic aortic (valve) insufficiency: Secondary | ICD-10-CM | POA: Diagnosis not present

## 2020-07-20 DIAGNOSIS — I350 Nonrheumatic aortic (valve) stenosis: Secondary | ICD-10-CM | POA: Diagnosis not present

## 2020-07-20 DIAGNOSIS — I2510 Atherosclerotic heart disease of native coronary artery without angina pectoris: Secondary | ICD-10-CM | POA: Diagnosis not present

## 2020-07-20 DIAGNOSIS — I739 Peripheral vascular disease, unspecified: Secondary | ICD-10-CM | POA: Diagnosis not present

## 2020-07-20 DIAGNOSIS — I351 Nonrheumatic aortic (valve) insufficiency: Secondary | ICD-10-CM | POA: Diagnosis not present

## 2020-07-20 DIAGNOSIS — E785 Hyperlipidemia, unspecified: Secondary | ICD-10-CM | POA: Diagnosis not present

## 2020-07-20 DIAGNOSIS — I10 Essential (primary) hypertension: Secondary | ICD-10-CM | POA: Diagnosis not present

## 2020-07-22 DIAGNOSIS — I351 Nonrheumatic aortic (valve) insufficiency: Secondary | ICD-10-CM | POA: Diagnosis not present

## 2020-07-22 DIAGNOSIS — I739 Peripheral vascular disease, unspecified: Secondary | ICD-10-CM | POA: Diagnosis not present

## 2020-07-22 DIAGNOSIS — E785 Hyperlipidemia, unspecified: Secondary | ICD-10-CM | POA: Diagnosis not present

## 2020-07-22 DIAGNOSIS — I10 Essential (primary) hypertension: Secondary | ICD-10-CM | POA: Diagnosis not present

## 2020-07-22 DIAGNOSIS — I2510 Atherosclerotic heart disease of native coronary artery without angina pectoris: Secondary | ICD-10-CM | POA: Diagnosis not present

## 2020-07-22 DIAGNOSIS — I350 Nonrheumatic aortic (valve) stenosis: Secondary | ICD-10-CM | POA: Diagnosis not present

## 2020-07-25 DIAGNOSIS — I350 Nonrheumatic aortic (valve) stenosis: Secondary | ICD-10-CM | POA: Diagnosis not present

## 2020-07-25 DIAGNOSIS — I10 Essential (primary) hypertension: Secondary | ICD-10-CM | POA: Diagnosis not present

## 2020-07-25 DIAGNOSIS — E785 Hyperlipidemia, unspecified: Secondary | ICD-10-CM | POA: Diagnosis not present

## 2020-07-25 DIAGNOSIS — I351 Nonrheumatic aortic (valve) insufficiency: Secondary | ICD-10-CM | POA: Diagnosis not present

## 2020-07-25 DIAGNOSIS — I2510 Atherosclerotic heart disease of native coronary artery without angina pectoris: Secondary | ICD-10-CM | POA: Diagnosis not present

## 2020-07-25 DIAGNOSIS — I739 Peripheral vascular disease, unspecified: Secondary | ICD-10-CM | POA: Diagnosis not present

## 2020-07-26 ENCOUNTER — Ambulatory Visit: Payer: Medicare Other | Admitting: Cardiovascular Disease

## 2020-07-27 DIAGNOSIS — I351 Nonrheumatic aortic (valve) insufficiency: Secondary | ICD-10-CM | POA: Diagnosis not present

## 2020-07-27 DIAGNOSIS — I2510 Atherosclerotic heart disease of native coronary artery without angina pectoris: Secondary | ICD-10-CM | POA: Diagnosis not present

## 2020-07-27 DIAGNOSIS — E785 Hyperlipidemia, unspecified: Secondary | ICD-10-CM | POA: Diagnosis not present

## 2020-07-27 DIAGNOSIS — I10 Essential (primary) hypertension: Secondary | ICD-10-CM | POA: Diagnosis not present

## 2020-07-27 DIAGNOSIS — I739 Peripheral vascular disease, unspecified: Secondary | ICD-10-CM | POA: Diagnosis not present

## 2020-07-27 DIAGNOSIS — I350 Nonrheumatic aortic (valve) stenosis: Secondary | ICD-10-CM | POA: Diagnosis not present

## 2020-07-28 DIAGNOSIS — I2510 Atherosclerotic heart disease of native coronary artery without angina pectoris: Secondary | ICD-10-CM | POA: Diagnosis not present

## 2020-07-28 DIAGNOSIS — I351 Nonrheumatic aortic (valve) insufficiency: Secondary | ICD-10-CM | POA: Diagnosis not present

## 2020-07-28 DIAGNOSIS — E785 Hyperlipidemia, unspecified: Secondary | ICD-10-CM | POA: Diagnosis not present

## 2020-07-28 DIAGNOSIS — I10 Essential (primary) hypertension: Secondary | ICD-10-CM | POA: Diagnosis not present

## 2020-07-28 DIAGNOSIS — I350 Nonrheumatic aortic (valve) stenosis: Secondary | ICD-10-CM | POA: Diagnosis not present

## 2020-07-28 DIAGNOSIS — I739 Peripheral vascular disease, unspecified: Secondary | ICD-10-CM | POA: Diagnosis not present

## 2020-07-29 DIAGNOSIS — I351 Nonrheumatic aortic (valve) insufficiency: Secondary | ICD-10-CM | POA: Diagnosis not present

## 2020-07-29 DIAGNOSIS — I350 Nonrheumatic aortic (valve) stenosis: Secondary | ICD-10-CM | POA: Diagnosis not present

## 2020-07-29 DIAGNOSIS — I10 Essential (primary) hypertension: Secondary | ICD-10-CM | POA: Diagnosis not present

## 2020-07-29 DIAGNOSIS — I2510 Atherosclerotic heart disease of native coronary artery without angina pectoris: Secondary | ICD-10-CM | POA: Diagnosis not present

## 2020-07-29 DIAGNOSIS — I739 Peripheral vascular disease, unspecified: Secondary | ICD-10-CM | POA: Diagnosis not present

## 2020-07-29 DIAGNOSIS — E785 Hyperlipidemia, unspecified: Secondary | ICD-10-CM | POA: Diagnosis not present

## 2020-08-01 DIAGNOSIS — I2510 Atherosclerotic heart disease of native coronary artery without angina pectoris: Secondary | ICD-10-CM | POA: Diagnosis not present

## 2020-08-01 DIAGNOSIS — E785 Hyperlipidemia, unspecified: Secondary | ICD-10-CM | POA: Diagnosis not present

## 2020-08-01 DIAGNOSIS — I739 Peripheral vascular disease, unspecified: Secondary | ICD-10-CM | POA: Diagnosis not present

## 2020-08-01 DIAGNOSIS — I10 Essential (primary) hypertension: Secondary | ICD-10-CM | POA: Diagnosis not present

## 2020-08-01 DIAGNOSIS — I351 Nonrheumatic aortic (valve) insufficiency: Secondary | ICD-10-CM | POA: Diagnosis not present

## 2020-08-01 DIAGNOSIS — I350 Nonrheumatic aortic (valve) stenosis: Secondary | ICD-10-CM | POA: Diagnosis not present

## 2020-08-03 DIAGNOSIS — I739 Peripheral vascular disease, unspecified: Secondary | ICD-10-CM | POA: Diagnosis not present

## 2020-08-03 DIAGNOSIS — I351 Nonrheumatic aortic (valve) insufficiency: Secondary | ICD-10-CM | POA: Diagnosis not present

## 2020-08-03 DIAGNOSIS — I350 Nonrheumatic aortic (valve) stenosis: Secondary | ICD-10-CM | POA: Diagnosis not present

## 2020-08-03 DIAGNOSIS — I10 Essential (primary) hypertension: Secondary | ICD-10-CM | POA: Diagnosis not present

## 2020-08-03 DIAGNOSIS — I2510 Atherosclerotic heart disease of native coronary artery without angina pectoris: Secondary | ICD-10-CM | POA: Diagnosis not present

## 2020-08-03 DIAGNOSIS — E785 Hyperlipidemia, unspecified: Secondary | ICD-10-CM | POA: Diagnosis not present

## 2020-08-05 DIAGNOSIS — I10 Essential (primary) hypertension: Secondary | ICD-10-CM | POA: Diagnosis not present

## 2020-08-05 DIAGNOSIS — E785 Hyperlipidemia, unspecified: Secondary | ICD-10-CM | POA: Diagnosis not present

## 2020-08-05 DIAGNOSIS — I2510 Atherosclerotic heart disease of native coronary artery without angina pectoris: Secondary | ICD-10-CM | POA: Diagnosis not present

## 2020-08-05 DIAGNOSIS — I350 Nonrheumatic aortic (valve) stenosis: Secondary | ICD-10-CM | POA: Diagnosis not present

## 2020-08-05 DIAGNOSIS — I351 Nonrheumatic aortic (valve) insufficiency: Secondary | ICD-10-CM | POA: Diagnosis not present

## 2020-08-05 DIAGNOSIS — I739 Peripheral vascular disease, unspecified: Secondary | ICD-10-CM | POA: Diagnosis not present

## 2020-08-08 DIAGNOSIS — I351 Nonrheumatic aortic (valve) insufficiency: Secondary | ICD-10-CM | POA: Diagnosis not present

## 2020-08-08 DIAGNOSIS — I350 Nonrheumatic aortic (valve) stenosis: Secondary | ICD-10-CM | POA: Diagnosis not present

## 2020-08-08 DIAGNOSIS — I739 Peripheral vascular disease, unspecified: Secondary | ICD-10-CM | POA: Diagnosis not present

## 2020-08-08 DIAGNOSIS — E785 Hyperlipidemia, unspecified: Secondary | ICD-10-CM | POA: Diagnosis not present

## 2020-08-08 DIAGNOSIS — I2510 Atherosclerotic heart disease of native coronary artery without angina pectoris: Secondary | ICD-10-CM | POA: Diagnosis not present

## 2020-08-08 DIAGNOSIS — I10 Essential (primary) hypertension: Secondary | ICD-10-CM | POA: Diagnosis not present

## 2020-08-10 DIAGNOSIS — I10 Essential (primary) hypertension: Secondary | ICD-10-CM | POA: Diagnosis not present

## 2020-08-10 DIAGNOSIS — I739 Peripheral vascular disease, unspecified: Secondary | ICD-10-CM | POA: Diagnosis not present

## 2020-08-10 DIAGNOSIS — E785 Hyperlipidemia, unspecified: Secondary | ICD-10-CM | POA: Diagnosis not present

## 2020-08-10 DIAGNOSIS — I350 Nonrheumatic aortic (valve) stenosis: Secondary | ICD-10-CM | POA: Diagnosis not present

## 2020-08-10 DIAGNOSIS — I351 Nonrheumatic aortic (valve) insufficiency: Secondary | ICD-10-CM | POA: Diagnosis not present

## 2020-08-10 DIAGNOSIS — I2510 Atherosclerotic heart disease of native coronary artery without angina pectoris: Secondary | ICD-10-CM | POA: Diagnosis not present

## 2020-08-12 DIAGNOSIS — I351 Nonrheumatic aortic (valve) insufficiency: Secondary | ICD-10-CM | POA: Diagnosis not present

## 2020-08-12 DIAGNOSIS — I350 Nonrheumatic aortic (valve) stenosis: Secondary | ICD-10-CM | POA: Diagnosis not present

## 2020-08-12 DIAGNOSIS — I739 Peripheral vascular disease, unspecified: Secondary | ICD-10-CM | POA: Diagnosis not present

## 2020-08-12 DIAGNOSIS — E785 Hyperlipidemia, unspecified: Secondary | ICD-10-CM | POA: Diagnosis not present

## 2020-08-12 DIAGNOSIS — I2510 Atherosclerotic heart disease of native coronary artery without angina pectoris: Secondary | ICD-10-CM | POA: Diagnosis not present

## 2020-08-12 DIAGNOSIS — I10 Essential (primary) hypertension: Secondary | ICD-10-CM | POA: Diagnosis not present

## 2020-08-14 DIAGNOSIS — F0390 Unspecified dementia without behavioral disturbance: Secondary | ICD-10-CM | POA: Diagnosis not present

## 2020-08-14 DIAGNOSIS — I739 Peripheral vascular disease, unspecified: Secondary | ICD-10-CM | POA: Diagnosis not present

## 2020-08-14 DIAGNOSIS — E785 Hyperlipidemia, unspecified: Secondary | ICD-10-CM | POA: Diagnosis not present

## 2020-08-14 DIAGNOSIS — I10 Essential (primary) hypertension: Secondary | ICD-10-CM | POA: Diagnosis not present

## 2020-08-14 DIAGNOSIS — F419 Anxiety disorder, unspecified: Secondary | ICD-10-CM | POA: Diagnosis not present

## 2020-08-14 DIAGNOSIS — I350 Nonrheumatic aortic (valve) stenosis: Secondary | ICD-10-CM | POA: Diagnosis not present

## 2020-08-14 DIAGNOSIS — I2510 Atherosclerotic heart disease of native coronary artery without angina pectoris: Secondary | ICD-10-CM | POA: Diagnosis not present

## 2020-08-14 DIAGNOSIS — H409 Unspecified glaucoma: Secondary | ICD-10-CM | POA: Diagnosis not present

## 2020-08-14 DIAGNOSIS — I351 Nonrheumatic aortic (valve) insufficiency: Secondary | ICD-10-CM | POA: Diagnosis not present

## 2020-08-14 DIAGNOSIS — K219 Gastro-esophageal reflux disease without esophagitis: Secondary | ICD-10-CM | POA: Diagnosis not present

## 2020-08-14 DIAGNOSIS — M1990 Unspecified osteoarthritis, unspecified site: Secondary | ICD-10-CM | POA: Diagnosis not present

## 2020-08-14 DIAGNOSIS — D649 Anemia, unspecified: Secondary | ICD-10-CM | POA: Diagnosis not present

## 2020-08-15 DIAGNOSIS — I351 Nonrheumatic aortic (valve) insufficiency: Secondary | ICD-10-CM | POA: Diagnosis not present

## 2020-08-15 DIAGNOSIS — E785 Hyperlipidemia, unspecified: Secondary | ICD-10-CM | POA: Diagnosis not present

## 2020-08-15 DIAGNOSIS — I2510 Atherosclerotic heart disease of native coronary artery without angina pectoris: Secondary | ICD-10-CM | POA: Diagnosis not present

## 2020-08-15 DIAGNOSIS — I739 Peripheral vascular disease, unspecified: Secondary | ICD-10-CM | POA: Diagnosis not present

## 2020-08-15 DIAGNOSIS — I350 Nonrheumatic aortic (valve) stenosis: Secondary | ICD-10-CM | POA: Diagnosis not present

## 2020-08-15 DIAGNOSIS — I10 Essential (primary) hypertension: Secondary | ICD-10-CM | POA: Diagnosis not present

## 2020-08-17 DIAGNOSIS — E785 Hyperlipidemia, unspecified: Secondary | ICD-10-CM | POA: Diagnosis not present

## 2020-08-17 DIAGNOSIS — I351 Nonrheumatic aortic (valve) insufficiency: Secondary | ICD-10-CM | POA: Diagnosis not present

## 2020-08-17 DIAGNOSIS — I350 Nonrheumatic aortic (valve) stenosis: Secondary | ICD-10-CM | POA: Diagnosis not present

## 2020-08-17 DIAGNOSIS — I10 Essential (primary) hypertension: Secondary | ICD-10-CM | POA: Diagnosis not present

## 2020-08-17 DIAGNOSIS — I2510 Atherosclerotic heart disease of native coronary artery without angina pectoris: Secondary | ICD-10-CM | POA: Diagnosis not present

## 2020-08-17 DIAGNOSIS — I739 Peripheral vascular disease, unspecified: Secondary | ICD-10-CM | POA: Diagnosis not present

## 2020-08-19 DIAGNOSIS — I351 Nonrheumatic aortic (valve) insufficiency: Secondary | ICD-10-CM | POA: Diagnosis not present

## 2020-08-19 DIAGNOSIS — I10 Essential (primary) hypertension: Secondary | ICD-10-CM | POA: Diagnosis not present

## 2020-08-19 DIAGNOSIS — I350 Nonrheumatic aortic (valve) stenosis: Secondary | ICD-10-CM | POA: Diagnosis not present

## 2020-08-19 DIAGNOSIS — E785 Hyperlipidemia, unspecified: Secondary | ICD-10-CM | POA: Diagnosis not present

## 2020-08-19 DIAGNOSIS — I739 Peripheral vascular disease, unspecified: Secondary | ICD-10-CM | POA: Diagnosis not present

## 2020-08-19 DIAGNOSIS — I2510 Atherosclerotic heart disease of native coronary artery without angina pectoris: Secondary | ICD-10-CM | POA: Diagnosis not present

## 2020-08-22 DIAGNOSIS — I350 Nonrheumatic aortic (valve) stenosis: Secondary | ICD-10-CM | POA: Diagnosis not present

## 2020-08-22 DIAGNOSIS — I10 Essential (primary) hypertension: Secondary | ICD-10-CM | POA: Diagnosis not present

## 2020-08-22 DIAGNOSIS — I2510 Atherosclerotic heart disease of native coronary artery without angina pectoris: Secondary | ICD-10-CM | POA: Diagnosis not present

## 2020-08-22 DIAGNOSIS — I351 Nonrheumatic aortic (valve) insufficiency: Secondary | ICD-10-CM | POA: Diagnosis not present

## 2020-08-22 DIAGNOSIS — E785 Hyperlipidemia, unspecified: Secondary | ICD-10-CM | POA: Diagnosis not present

## 2020-08-22 DIAGNOSIS — I739 Peripheral vascular disease, unspecified: Secondary | ICD-10-CM | POA: Diagnosis not present

## 2020-08-23 ENCOUNTER — Telehealth: Payer: Self-pay | Admitting: *Deleted

## 2020-08-23 DIAGNOSIS — E785 Hyperlipidemia, unspecified: Secondary | ICD-10-CM | POA: Diagnosis not present

## 2020-08-23 DIAGNOSIS — I350 Nonrheumatic aortic (valve) stenosis: Secondary | ICD-10-CM | POA: Diagnosis not present

## 2020-08-23 DIAGNOSIS — I10 Essential (primary) hypertension: Secondary | ICD-10-CM | POA: Diagnosis not present

## 2020-08-23 DIAGNOSIS — I2510 Atherosclerotic heart disease of native coronary artery without angina pectoris: Secondary | ICD-10-CM | POA: Diagnosis not present

## 2020-08-23 DIAGNOSIS — I739 Peripheral vascular disease, unspecified: Secondary | ICD-10-CM | POA: Diagnosis not present

## 2020-08-23 DIAGNOSIS — I351 Nonrheumatic aortic (valve) insufficiency: Secondary | ICD-10-CM | POA: Diagnosis not present

## 2020-08-23 NOTE — Telephone Encounter (Signed)
Kaitlyn notified and agreed. Stated that she will inform the family.

## 2020-08-23 NOTE — Telephone Encounter (Signed)
Kaitlyn with [facility] stated that it is in his stool. Moderate amount of blood, dark red.  Not constipated and no other symptoms noted. No abdomin pain.  Vital signs stable.  Family does not want to go to Hospital.  Please Advise.

## 2020-08-23 NOTE — Telephone Encounter (Signed)
I would ensure comfort if he is under hospice and not wanting to go to the hospital.  I do not see that he is taking any ASA He was previously on protonix but looks like they stopped this. He can restart protonix 40 mg daily if he is able to take medication.

## 2020-08-23 NOTE — Telephone Encounter (Signed)
Kaitlyn with [facility] called and stated that patient is having blood clots, darker red, when he went to bathroom today. No other symptoms noted. Family does NOT want to take him to the Hospital and wonders if you have any suggestions.   Please Advise.

## 2020-08-24 ENCOUNTER — Emergency Department (HOSPITAL_COMMUNITY)
Admission: EM | Admit: 2020-08-24 | Discharge: 2020-08-24 | Disposition: A | Discharge status: Home / Self Care | Payer: Medicare Other | Attending: Emergency Medicine | Admitting: Emergency Medicine

## 2020-08-24 ENCOUNTER — Other Ambulatory Visit: Payer: Self-pay

## 2020-08-24 ENCOUNTER — Encounter (HOSPITAL_COMMUNITY): Payer: Self-pay | Admitting: Emergency Medicine

## 2020-08-24 DIAGNOSIS — K922 Gastrointestinal hemorrhage, unspecified: Secondary | ICD-10-CM | POA: Insufficient documentation

## 2020-08-24 DIAGNOSIS — K219 Gastro-esophageal reflux disease without esophagitis: Secondary | ICD-10-CM | POA: Insufficient documentation

## 2020-08-24 DIAGNOSIS — I2510 Atherosclerotic heart disease of native coronary artery without angina pectoris: Secondary | ICD-10-CM | POA: Diagnosis not present

## 2020-08-24 DIAGNOSIS — Z79899 Other long term (current) drug therapy: Secondary | ICD-10-CM | POA: Diagnosis not present

## 2020-08-24 DIAGNOSIS — Z951 Presence of aortocoronary bypass graft: Secondary | ICD-10-CM | POA: Diagnosis not present

## 2020-08-24 DIAGNOSIS — K625 Hemorrhage of anus and rectum: Secondary | ICD-10-CM

## 2020-08-24 DIAGNOSIS — I10 Essential (primary) hypertension: Secondary | ICD-10-CM | POA: Diagnosis not present

## 2020-08-24 DIAGNOSIS — I350 Nonrheumatic aortic (valve) stenosis: Secondary | ICD-10-CM | POA: Diagnosis not present

## 2020-08-24 DIAGNOSIS — Z87891 Personal history of nicotine dependence: Secondary | ICD-10-CM | POA: Insufficient documentation

## 2020-08-24 DIAGNOSIS — E785 Hyperlipidemia, unspecified: Secondary | ICD-10-CM | POA: Diagnosis not present

## 2020-08-24 DIAGNOSIS — I351 Nonrheumatic aortic (valve) insufficiency: Secondary | ICD-10-CM | POA: Diagnosis not present

## 2020-08-24 DIAGNOSIS — I739 Peripheral vascular disease, unspecified: Secondary | ICD-10-CM | POA: Diagnosis not present

## 2020-08-24 LAB — CBC WITH DIFFERENTIAL/PLATELET
Abs Immature Granulocytes: 0.03 10*3/uL (ref 0.00–0.07)
Basophils Absolute: 0 10*3/uL (ref 0.0–0.1)
Basophils Relative: 0 %
Eosinophils Absolute: 0.2 10*3/uL (ref 0.0–0.5)
Eosinophils Relative: 2 %
HCT: 33 % — ABNORMAL LOW (ref 39.0–52.0)
Hemoglobin: 10 g/dL — ABNORMAL LOW (ref 13.0–17.0)
Immature Granulocytes: 0 %
Lymphocytes Relative: 22 %
Lymphs Abs: 1.7 10*3/uL (ref 0.7–4.0)
MCH: 29.2 pg (ref 26.0–34.0)
MCHC: 30.3 g/dL (ref 30.0–36.0)
MCV: 96.5 fL (ref 80.0–100.0)
Monocytes Absolute: 0.6 10*3/uL (ref 0.1–1.0)
Monocytes Relative: 8 %
Neutro Abs: 5.1 10*3/uL (ref 1.7–7.7)
Neutrophils Relative %: 68 %
Platelets: 173 10*3/uL (ref 150–400)
RBC: 3.42 MIL/uL — ABNORMAL LOW (ref 4.22–5.81)
RDW: 14.3 % (ref 11.5–15.5)
WBC: 7.7 10*3/uL (ref 4.0–10.5)
nRBC: 0 % (ref 0.0–0.2)

## 2020-08-24 LAB — TYPE AND SCREEN
ABO/RH(D): AB POS
Antibody Screen: NEGATIVE

## 2020-08-24 LAB — BASIC METABOLIC PANEL
Anion gap: 6 (ref 5–15)
BUN: 22 mg/dL (ref 8–23)
CO2: 21 mmol/L — ABNORMAL LOW (ref 22–32)
Calcium: 8.9 mg/dL (ref 8.9–10.3)
Chloride: 113 mmol/L — ABNORMAL HIGH (ref 98–111)
Creatinine, Ser: 0.92 mg/dL (ref 0.61–1.24)
GFR, Estimated: >60 mL/min (ref >60)
Glucose, Bld: 83 mg/dL (ref 70–99)
Potassium: 4.6 mmol/L (ref 3.5–5.1)
Sodium: 140 mmol/L (ref 135–145)

## 2020-08-24 LAB — POC OCCULT BLOOD, ED: Fecal Occult Bld: POSITIVE — AB

## 2020-08-24 NOTE — ED Notes (Signed)
The pt drank a cup of gingerale approx 30 minutes ago and the  Liquid has stayed down  He has also finished his walk

## 2020-08-24 NOTE — Telephone Encounter (Signed)
Patient daughter "[name]" called and states that they are taking patient "[name] [name]" to the hospital because his bowel movements are full of blood. Patient daughter states that she would like PCP Ngetich, Dinah C, NP notified of situation. I notified patient daughter that PCP will be out of office for a while. However I will pass message along to one of colleagues.

## 2020-08-24 NOTE — ED Provider Notes (Signed)
[facility] EMERGENCY DEPARTMENT Provider Note   CSN: [id number] Arrival date & time: 08/24/20  1629     History Chief Complaint  Patient presents with  . Rectal Bleeding    [name] [name] is a 87 y.o. male.  The history is provided by the patient, a relative and medical records. No language interpreter was used.  Rectal Bleeding Quality:  Black and tarry and maroon Amount:  Moderate Duration:  2 days Timing:  Intermittent Chronicity:  Recurrent Context: not hemorrhoids, not rectal injury and not rectal pain   Relieved by:  Nothing Worsened by:  Nothing Ineffective treatments:  None tried Associated symptoms: abdominal pain (resolved)   Associated symptoms: no dizziness, no epistaxis, no fever, no hematemesis, no light-headedness, no loss of consciousness, no recent illness and no vomiting   Risk factors: no anticoagulant use        Past Medical History:  Diagnosis Date  . Anxiety   . Aortic stenosis   . Arthritis   . Carotid artery disease ([place])   . Coronary artery disease    sees Dr. Verl Blalock  . Depression   . DJD (degenerative joint disease)    right hip  . GERD (gastroesophageal reflux disease)    hx of  . Glaucoma   . Glucose intolerance (impaired glucose tolerance)   . Headache(784.0)    hx of migraines  . Herpes zoster 2009   r. scalp and opth  . Hx of colonic polyps 2004   hyperplastic  . Hyperlipidemia   . Hypertension    sees Dr. Adelene Amas  . Internal and external hemorrhoids without complication   . Low back pain    facet arthropathy  . Lumbago   . Peripheral vascular disease ([place])   . Pneumonia 2009   rll with a small effusion  . Postherpetic neuralgia 2009    Patient Active Problem List   Diagnosis Date Noted  . GERD (gastroesophageal reflux disease)   . Chronic anemia 04/19/2020  . Acute bronchitis with bronchospasm 12/22/2019  . Urinary incontinence 11/18/2019  . Failure to thrive in adult 08/04/2019  .  Trochanteric bursitis of right hip 04/28/2019  . Cellulitis 04/28/2019  . Dementia ([place]) 05/12/2018  . Auditory hallucination 05/12/2018  . Right leg swelling 09/05/2017  . Epistaxis 01/16/2017  . Tinea pedis 06/29/2016  . Vertigo 02/13/2016  . MGUS (monoclonal gammopathy of unknown significance) 01/31/2016  . ARF (acute renal failure) ([place]) 12/29/2015  . Acute kidney injury ([place]) 12/23/2015  . Diarrhea 12/21/2015  . Asymptomatic gallstones 08/31/2015  . Weight loss 08/17/2015  . Edema 08/17/2015  . Serous otitis media 05/20/2015  . Sebaceous cyst of left axilla 10/15/2014  . Peripheral arterial disease ([place]) 12/29/2013  . Cerumen impaction 12/09/2013  . Pain in lower limb 08/17/2013  . Dry skin 06/10/2013  . Aortic valve stenosis 12/11/2012  . Allergic rhinitis 08/19/2012  . Onychomycosis 08/12/2012  . Pain in joint, ankle and foot 08/12/2012  . CTS (carpal tunnel syndrome) 05/13/2012  . Chronic venous insufficiency 05/13/2012  . Right bundle branch block 12/10/2011  . CAP (community acquired pneumonia)   . Cough 03/31/2010  . TOBACCO USE, QUIT 04/01/2009  . CORONARY ATHEROSCLEROSIS NATIVE CORONARY ARTERY 03/25/2009  . CAROTID ARTERY STENOSIS, WITHOUT INFARCTION 02/11/2009  . Abdominal pain, epigastric 11/17/2008  . PLEURAL EFFUSION, RIGHT 02/03/2008  . DYSPNEA 02/03/2008  . FOOT PAIN 09/22/2007  . ABNORMAL LIVER FUNCTION TESTS 09/22/2007  . POSTHERPETIC NEURALGIA 07/17/2007  . LOW BACK PAIN 07/17/2007  .  COLONIC POLYPS, HX OF 07/17/2007  . SHINGLES 07/03/2007  . Dyslipidemia 05/01/2007  . Benign hypertensive heart and renal disease with renal failure 05/01/2007  . MYALGIA 05/01/2007  . ABNORMAL GLUCOSE NEC 05/01/2007    Past Surgical History:  Procedure Laterality Date  . ACNE CYST REMOVAL  1970's   removed x2 off the back  . CARPAL TUNNEL RELEASE     left  . CATARACT EXTRACTION W/PHACO  02/13/2012   Procedure: CATARACT EXTRACTION PHACO AND INTRAOCULAR LENS  PLACEMENT (IOC);  Surgeon: Adonis Brook, MD;  Location: [facility];  Service: Ophthalmology;  Laterality: Left;  . COLONOSCOPY    . CORONARY ARTERY BYPASS GRAFT  2002  . REPOSITION OF LENS  03/14/2012   Procedure: REPOSITION OF LENS;  Surgeon: Adonis Brook, MD;  Location: [facility];  Service: Ophthalmology;  Laterality: Left;  Reposition Ocular Lens Left Eye  . thumb tendon surgery     left  . TONSILLECTOMY     age 47       Family History  Problem Relation Age of Onset  . Breast cancer Mother   . Pulmonary embolism Father   . Heart attack Father   . Heart disease Sister   . Heart disease Brother   . Colon cancer Neg Hx   . Esophageal cancer Neg Hx   . Pancreatic cancer Neg Hx   . Stomach cancer Neg Hx     Social History   Tobacco Use  . Smoking status: Former Smoker    Packs/day: 0.75    Years: 25.00    Pack years: 18.75    Quit date: 04/25/1988    Years since quitting: 32.3  . Smokeless tobacco: Never Used  . Tobacco comment: quit at age 70  Vaping Use  . Vaping Use: Never used  Substance Use Topics  . Alcohol use: Not Currently    Alcohol/week: 0.0 standard drinks    Comment: infrequent glass of wine  . Drug use: No    Home Medications Prior to Admission medications   Medication Sig Start Date End Date Taking? Authorizing Provider  acetaminophen (TYLENOL) 500 MG tablet Take 1 tablet (500 mg total) by mouth every 6 (six) hours as needed. 06/08/20   Ngetich, Dinah C, NP  Cholecalciferol (VITAMIN D3) 50 MCG (2000 UT) capsule Take 1 capsule (2,000 Units total) by mouth daily. 08/05/19   Plotnikov, Evie Lacks, MD  ferrous sulfate 325 (65 FE) MG tablet Take 325 mg by mouth daily with breakfast.    [provider]  furosemide (LASIX) 20 MG tablet Take 1 tablet (20 mg total) by mouth daily. 04/21/20   O'NealCassie Freer, MD  gabapentin (NEURONTIN) 100 MG capsule Take 100-300 mg by mouth as needed.    [provider]  melatonin 3 MG TABS tablet Take 1 tablet (3 mg  total) by mouth at bedtime as needed. 06/08/20   Ngetich, Dinah C, NP  TRAVATAN Z 0.004 % SOLN ophthalmic solution Place 1 drop into both eyes daily. 06/21/15   [provider]    Allergies    Atorvastatin, Benazepril hcl, Cosopt [dorzolamide hcl-timolol mal], Ezetimibe-simvastatin, Aricept [donepezil hcl], Norvasc [amlodipine besylate], and Zoloft [sertraline]  Review of Systems   Review of Systems  Constitutional: Negative for chills, diaphoresis, fatigue and fever.  HENT: Negative for congestion and nosebleeds.   Eyes: Negative for visual disturbance.  Respiratory: Negative for cough, chest tightness, shortness of breath and wheezing.   Cardiovascular: Negative for chest pain, palpitations and leg swelling.  Gastrointestinal: Positive for abdominal pain (resolved), blood in stool and hematochezia. Negative for abdominal distention, constipation, diarrhea, hematemesis, nausea and vomiting.  Genitourinary: Negative for dysuria, flank pain and frequency.  Musculoskeletal: Negative for back pain, neck pain and neck stiffness.  Skin: Negative for rash and wound.  Neurological: Negative for dizziness, loss of consciousness, light-headedness and headaches.  Psychiatric/Behavioral: Negative for agitation.  All other systems reviewed and are negative.   Physical Exam Updated Vital Signs BP 132/60 (BP Location: Left Arm)   Pulse 71   Temp 99.1 F (37.3 C) (Oral)   Resp 18   SpO2 100%   Physical Exam Vitals and nursing note reviewed. Exam conducted with a chaperone present.  Constitutional:      General: He is not in acute distress.    Appearance: He is well-developed. He is not ill-appearing, toxic-appearing or diaphoretic.  HENT:     Head: Normocephalic and atraumatic.     Right Ear: External ear normal.     Left Ear: External ear normal.     Nose: Nose normal. No congestion or rhinorrhea.     Mouth/Throat:     Mouth: Mucous membranes are moist.     Pharynx: No  oropharyngeal exudate or posterior oropharyngeal erythema.  Eyes:     Extraocular Movements: Extraocular movements intact.     Conjunctiva/sclera: Conjunctivae normal.     Pupils: Pupils are equal, round, and reactive to light.  Cardiovascular:     Rate and Rhythm: Normal rate.     Pulses: Normal pulses.     Heart sounds: No murmur heard.   Pulmonary:     Effort: Pulmonary effort is normal. No respiratory distress.     Breath sounds: No stridor. No wheezing, rhonchi or rales.  Chest:     Chest wall: No tenderness.  Abdominal:     General: Abdomen is flat.     Palpations: Abdomen is soft.     Tenderness: There is no abdominal tenderness. There is no right CVA tenderness, left CVA tenderness, guarding or rebound.  Genitourinary:    Rectum: Guaiac result positive. No mass, tenderness, anal fissure or external hemorrhoid. Normal anal tone.  Musculoskeletal:        General: No tenderness.     Cervical back: Normal range of motion and neck supple. No tenderness.     Right lower leg: No edema.     Left lower leg: No edema.  Skin:    General: Skin is warm.     Capillary Refill: Capillary refill takes less than 2 seconds.     Findings: No erythema or rash.  Neurological:     General: No focal deficit present.     Mental Status: He is alert and oriented to person, place, and time.     Cranial Nerves: No cranial nerve deficit.     Sensory: No sensory deficit.     Motor: No weakness or abnormal muscle tone.     Coordination: Coordination normal.     Deep Tendon Reflexes: Reflexes normal.  Psychiatric:        Mood and Affect: Mood normal.     ED Results / Procedures / Treatments   Labs (all labs ordered are listed, but only abnormal results are displayed) Labs Reviewed  BASIC METABOLIC PANEL - Abnormal; Notable for the following components:      Result Value   Chloride 113 (*)    CO2 21 (*)    All other components within normal limits  CBC WITH DIFFERENTIAL/PLATELET -  Abnormal; Notable for the following components:   RBC 3.42 (*)    Hemoglobin 10.0 (*)    HCT 33.0 (*)    All other components within normal limits  POC OCCULT BLOOD, ED - Abnormal; Notable for the following components:   Fecal Occult Bld POSITIVE (*)    All other components within normal limits  TYPE AND SCREEN    EKG None  Radiology No results found.  Procedures Procedures   Medications Ordered in ED Medications - No data to display  ED Course  I have reviewed the triage vital signs and the nursing notes.  Pertinent labs & imaging results that were available during my care of the patient were reviewed by me and considered in my medical decision making (see chart for details).    MDM Rules/Calculators/A&P                          [name] [name] is a 87 y.o. male with a past medical history significant for CAD, myalgias, prior GI bleed, dementia, and family report of hospice involvement who presents with rectal bleeding.  Patient reports over the last 2 days he has had dark stools with some maroon blood as well.  He says that he has had this in the past and thinks he is having rectal bleeding.  He denies any lightheadedness or fatigue.  He denies any chest pain or shortness of breath.  He denied abdominal pain today but report he had a small bowel discomfort yesterday.  He denies any trauma or other complaints.  Specifically denies any neurologic complaints urinary symptoms or other issues.  On exam, lungs clear and chest nontender.  Abdomen is nontender with normal bowel sounds.  A fecal occult was collected and was positive as expected with a chaperone.  No hernias palpated and patient had no discomfort on rectal exam or abdominal exam.  Exam otherwise unremarkable.  Laboratory testing shows hemoglobin is similar to prior.  He says that last time this happened he had to get blood transfusions but as his hemoglobin is similar to what it was several months ago do not feel that is  the case now.  Had a long shared decision made conversation with patient and family and given his lack of any pain or tenderness on exam, do not feel CT scan is needed and patient agrees.  He will follow-up with his PCP in several days for repeat hemoglobin check, he will maintain hydration, and watch for any symptoms of symptomatic anemia.  He will also follow-up with GI to discuss further evaluation.  Patient and family agree with plan of care and patient discharged in good condition and well appearance.        Final Clinical Impression(s) / ED Diagnoses Final diagnoses:  Rectal bleeding  Gastrointestinal hemorrhage, unspecified gastrointestinal hemorrhage type    Rx / DC Orders ED Discharge Orders    None     Clinical Impression: 1. Rectal bleeding   2. Gastrointestinal hemorrhage, unspecified gastrointestinal hemorrhage type     Disposition: Discharge  Condition: Good  I have discussed the results, Dx and Tx plan with the pt(& family if present). He/she/they expressed understanding and agree(s) with the plan. Discharge instructions discussed at great length. Strict return precautions discussed and pt &/or family have verbalized understanding of the instructions. No further questions at time of discharge.    Discharge Medication List as of 08/24/2020  9:47 PM  Follow Up: Sandrea Hughs, NP [place] [id number] [phone]     [facility] [place] [phone]       Shayana Hornstein, Gwenyth Allegra, MD 08/25/20 [phone]

## 2020-08-24 NOTE — ED Triage Notes (Signed)
Pt BIB family, reports rectal bleeding x 2 days. Initially dark stools, now bright red blood. Denies vomiting, c/o generalized weakness.

## 2020-08-24 NOTE — Telephone Encounter (Signed)
[name] [name] with [facility] [phone] called and stated that patient is feeling weak today and still has large amount of blood in stool. Family is taking patient to the ER for evaluation and just wanted to let provider know.   FYI

## 2020-08-24 NOTE — Discharge Instructions (Signed)
Your history, exam, and work-up today are consistent with a GI bleed causing the blood in your bowel movements.  Your exam was reassuring and due to the lack of abdominal pain or tenderness, we have a low suspicion for acute diverticulitis.  Your hemoglobin appeared similar to prior and did not need blood transfusion today.  Given your lack of other symptoms and reassuring vital signs, we do feel you are safe for discharge home however you do want you to follow-up with your primary doctor in several days for repeat hemoglobin, close follow-up with your gastroenterologist, as well as good return precautions if any symptoms are to change or worsen or you become symptomatic.  Please rest and stay hydrated.

## 2020-08-24 NOTE — ED Provider Notes (Signed)
Emergency Medicine Provider Triage Evaluation Note  [name] [name] 87 y.o. male was evaluated in triage.  Pt complains of rectal bleeding that is been ongoing for the last 2 days.  Daughter states he has had intermittent episodes that are somewhat volume is sometimes are smaller.  No blood thinners.  History of anemia and is required transfusions in the past.  Today started feeling weak and fatigued prompting ED visit. he states he has had some little bit of abdominal pain.  No chest pain.   Review of Systems  Positive: Rectal bleeding weak, fatigue, abdominal pain Negative: Chest pain  Physical Exam  BP 134/82   Pulse 70   Temp 98.2 F (36.8 C) (Oral)   Resp 18   Ht 5\' 4"  (1.626 m)   Wt 65.8 kg   SpO2 100%   BMI 24.89 kg/m  Gen:   Awake, no distress   HEENT:  Atraumatic  Resp:  Normal effort  Cardiac:  Normal rate  Abd:   Nondistended, mild generalized tenderness.  No rigidity, guarding. MSK:   Moves extremities without difficulty  Neuro:  Speech clear   Other:      Medical Decision Making  Medically screening exam initiated at 4:38 PM  Appropriate orders placed.  [name] [name] was informed that the remainder of the evaluation will be completed by another provider, this initial triage assessment does not replace that evaluation, and the importance of remaining in the ED until their evaluation is complete.   Clinical Impression  Rectal bleeding   Portions of this note were generated with Dragon dictation software. Dictation errors may occur despite best attempts at proofreading.     [name] Napoleon, PA-C 08/24/20 1639    [name] Chapel, MD 08/24/20 [phone]

## 2020-08-24 NOTE — ED Notes (Signed)
Patient ambulated down the hall. No assistance needed

## 2020-08-26 ENCOUNTER — Encounter: Payer: Self-pay | Admitting: Physician Assistant

## 2020-08-26 ENCOUNTER — Ambulatory Visit (INDEPENDENT_AMBULATORY_CARE_PROVIDER_SITE_OTHER): Payer: Medicare Other | Admitting: Physician Assistant

## 2020-08-26 ENCOUNTER — Other Ambulatory Visit: Payer: Self-pay

## 2020-08-26 ENCOUNTER — Telehealth: Payer: Self-pay

## 2020-08-26 ENCOUNTER — Other Ambulatory Visit (INDEPENDENT_AMBULATORY_CARE_PROVIDER_SITE_OTHER): Payer: Medicare Other

## 2020-08-26 VITALS — BP 136/54 | HR 67 | Ht 68.0 in | Wt 146.0 lb

## 2020-08-26 DIAGNOSIS — D649 Anemia, unspecified: Secondary | ICD-10-CM

## 2020-08-26 DIAGNOSIS — I2510 Atherosclerotic heart disease of native coronary artery without angina pectoris: Secondary | ICD-10-CM | POA: Diagnosis not present

## 2020-08-26 DIAGNOSIS — K921 Melena: Secondary | ICD-10-CM

## 2020-08-26 DIAGNOSIS — I739 Peripheral vascular disease, unspecified: Secondary | ICD-10-CM | POA: Diagnosis not present

## 2020-08-26 DIAGNOSIS — E785 Hyperlipidemia, unspecified: Secondary | ICD-10-CM | POA: Diagnosis not present

## 2020-08-26 DIAGNOSIS — I6523 Occlusion and stenosis of bilateral carotid arteries: Secondary | ICD-10-CM | POA: Diagnosis not present

## 2020-08-26 DIAGNOSIS — I10 Essential (primary) hypertension: Secondary | ICD-10-CM | POA: Diagnosis not present

## 2020-08-26 DIAGNOSIS — I350 Nonrheumatic aortic (valve) stenosis: Secondary | ICD-10-CM | POA: Diagnosis not present

## 2020-08-26 DIAGNOSIS — I351 Nonrheumatic aortic (valve) insufficiency: Secondary | ICD-10-CM | POA: Diagnosis not present

## 2020-08-26 LAB — CBC WITH DIFFERENTIAL/PLATELET
Basophils Absolute: 0 10*3/uL (ref 0.0–0.1)
Basophils Relative: 0.3 % (ref 0.0–3.0)
Eosinophils Absolute: 0.2 10*3/uL (ref 0.0–0.7)
Eosinophils Relative: 3.5 % (ref 0.0–5.0)
HCT: 24.5 % — ABNORMAL LOW (ref 39.0–52.0)
Hemoglobin: 8.1 g/dL — ABNORMAL LOW (ref 13.0–17.0)
Lymphocytes Relative: 26.1 % (ref 12.0–46.0)
Lymphs Abs: 1.8 10*3/uL (ref 0.7–4.0)
MCHC: 33.1 g/dL (ref 30.0–36.0)
MCV: 89.3 fl (ref 78.0–100.0)
Monocytes Absolute: 0.6 10*3/uL (ref 0.1–1.0)
Monocytes Relative: 8.4 % (ref 3.0–12.0)
Neutro Abs: 4.2 10*3/uL (ref 1.4–7.7)
Neutrophils Relative %: 61.7 % (ref 43.0–77.0)
Platelets: 218 10*3/uL (ref 150.0–400.0)
RBC: 2.75 Mil/uL — ABNORMAL LOW (ref 4.22–5.81)
RDW: 14.6 % (ref 11.5–15.5)
WBC: 6.8 10*3/uL (ref 4.0–10.5)

## 2020-08-26 LAB — COMPREHENSIVE METABOLIC PANEL
ALT: 11 U/L (ref 0–53)
AST: 18 U/L (ref 0–37)
Albumin: 3.6 g/dL (ref 3.5–5.2)
Alkaline Phosphatase: 70 U/L (ref 39–117)
BUN: 22 mg/dL (ref 6–23)
CO2: 25 mEq/L (ref 19–32)
Calcium: 8.7 mg/dL (ref 8.4–10.5)
Chloride: 109 mEq/L (ref 96–112)
Creatinine, Ser: 1 mg/dL (ref 0.40–1.50)
GFR: 68.52 mL/min (ref >60.00)
Glucose, Bld: 84 mg/dL (ref 70–99)
Potassium: 4.4 mEq/L (ref 3.5–5.1)
Sodium: 143 mEq/L (ref 135–145)
Total Bilirubin: 0.7 mg/dL (ref 0.2–1.2)
Total Protein: 5.6 g/dL — ABNORMAL LOW (ref 6.0–8.3)

## 2020-08-26 NOTE — Telephone Encounter (Signed)
Called and spoke with patients daughter Jolaine Artist) and let her know to come in for repeat labs on Monday. Daughter sated they would come in on Monday.

## 2020-08-26 NOTE — Progress Notes (Signed)
Chief Complaint: Follow-up ER visit for rectal bleeding  HPI:    [name] is an 87 year old male, assigned to Dr. Carlean Purl, with a past medical history as listed below including CAD, GERD, PVD, dementia, and multiple others, with hospice involvement at home, who was referred to me by Ngetich, Nelda Bucks, NP for follow-up after being seen in the ER for rectal bleeding.      04/19/2020 CBC with a hemoglobin of 10.7.    04/22/2020 echo with severe aortic stenosis.    04/22/2020 patient seen in clinic by Alonza Bogus for chronic anemia and weight loss.  At that time was discussed he is very high risk for procedures given his age and cardiac issues.  CT of the abdomen pelvis was ordered and fecal Hemoccult.  Patient was then moved to hospice and CT was canceled    08/24/2020 seen in the ER and described black and tarry and maroon stool for 2 days.  Rectal exam in the time showed guaiac positive stool with no mass, tenderness, fissure or external hemorrhoid.  BMP with a chloride of 113, CBC with a hemoglobin of 10 and fecal occult positive stool.    Today, the patient presents to clinic accompanied by his daughter who does assist with his daily care.  She explains that 2 days ago he had a large amount of maroonish/black stool in his Depends which was even seeping through into his pants (she brings with her a picture).  She took him to the ER as above.  Since then it has definitely decreased in amount but is still occurring.  Patient is not having any abdominal pain.  Was slightly constipated for period of time but they have restarted him on Senna 2 tabs once a day and this works well for him.    Denies fever, chills, weight loss, dizziness or shortness of breath.  Past Medical History:  Diagnosis Date  . Anxiety   . Aortic stenosis   . Arthritis   . Carotid artery disease ([place])   . Coronary artery disease    sees Dr. Verl Blalock  . Depression   . DJD (degenerative joint disease)    right hip  . GERD  (gastroesophageal reflux disease)    hx of  . Glaucoma   . Glucose intolerance (impaired glucose tolerance)   . Headache(784.0)    hx of migraines  . Herpes zoster 2009   r. scalp and opth  . Hx of colonic polyps 2004   hyperplastic  . Hyperlipidemia   . Hypertension    sees Dr. Adelene Amas  . Internal and external hemorrhoids without complication   . Low back pain    facet arthropathy  . Lumbago   . Peripheral vascular disease ([place])   . Pneumonia 2009   rll with a small effusion  . Postherpetic neuralgia 2009    Past Surgical History:  Procedure Laterality Date  . ACNE CYST REMOVAL  1970's   removed x2 off the back  . CARPAL TUNNEL RELEASE     left  . CATARACT EXTRACTION W/PHACO  02/13/2012   Procedure: CATARACT EXTRACTION PHACO AND INTRAOCULAR LENS PLACEMENT (IOC);  Surgeon: Adonis Brook, MD;  Location: [facility];  Service: Ophthalmology;  Laterality: Left;  . COLONOSCOPY    . CORONARY ARTERY BYPASS GRAFT  2002  . REPOSITION OF LENS  03/14/2012   Procedure: REPOSITION OF LENS;  Surgeon: Adonis Brook, MD;  Location: [facility];  Service: Ophthalmology;  Laterality: Left;  Reposition Ocular Lens Left  Eye  . thumb tendon surgery     left  . TONSILLECTOMY     age 29    Current Outpatient Medications  Medication Sig Dispense Refill  . acetaminophen (TYLENOL) 500 MG tablet Take 1 tablet (500 mg total) by mouth every 6 (six) hours as needed. (Patient taking differently: Take 500 mg by mouth every 6 (six) hours as needed for moderate pain or headache.) 30 tablet 0  . ALLERGY 25 MG tablet Take 25 mg by mouth every 4 (four) hours as needed for allergies.    . Cholecalciferol (VITAMIN D3) 50 MCG (2000 UT) capsule Take 1 capsule (2,000 Units total) by mouth daily. 100 capsule 3  . [facility] SENNA 8.6 MG tablet Take 2 tablets by mouth daily.    . ferrous sulfate 325 (65 FE) MG tablet Take 650 mg by mouth daily with breakfast.    . furosemide (LASIX) 20 MG tablet Take 1 tablet (20 mg total) by  mouth daily. (Patient taking differently: Take 20 mg by mouth every other day.) 90 tablet 3  . gabapentin (NEURONTIN) 100 MG capsule Take 100-300 mg by mouth as needed (nerve pain).    . LORazepam (ATIVAN) 0.5 MG tablet Take 0.5 mg by mouth every 4 (four) hours as needed for anxiety.    . melatonin 3 MG TABS tablet Take 1 tablet (3 mg total) by mouth at bedtime as needed. (Patient taking differently: Take 3 mg by mouth at bedtime as needed (sleep).) 30 tablet 3  . TRAVATAN Z 0.004 % SOLN ophthalmic solution Place 1 drop into both eyes daily.     No current facility-administered medications for this visit.    Allergies as of 08/26/2020 - Review Complete 08/26/2020  Allergen Reaction Noted  . Atorvastatin Other (See Comments) 09/08/2013  . Benazepril hcl Cough 03/31/2010  . Cosopt [dorzolamide hcl-timolol mal] Other (See Comments) 09/08/2013  . Ezetimibe-simvastatin Other (See Comments) 05/01/2007  . Aricept [donepezil hcl]  09/05/2018  . Norvasc [amlodipine besylate]  11/22/2017  . Zoloft [sertraline]  08/04/2019    Family History  Problem Relation Age of Onset  . Breast cancer Mother   . Pulmonary embolism Father   . Heart attack Father   . Heart disease Sister   . Heart disease Brother   . Colon cancer Neg Hx   . Esophageal cancer Neg Hx   . Pancreatic cancer Neg Hx   . Stomach cancer Neg Hx     Social History   Socioeconomic History  . Marital status: Divorced    Spouse name: Not on file  . Number of children: 3  . Years of education: Not on file  . Highest education level: Not on file  Occupational History    Employer: RETIRED  Tobacco Use  . Smoking status: Former Smoker    Packs/day: 0.75    Years: 25.00    Pack years: 18.75    Quit date: 04/25/1988    Years since quitting: 32.3  . Smokeless tobacco: Never Used  . Tobacco comment: quit at age 53  Vaping Use  . Vaping Use: Never used  Substance and Sexual Activity  . Alcohol use: Not Currently     Alcohol/week: 0.0 standard drinks    Comment: infrequent glass of wine  . Drug use: No  . Sexual activity: Not Currently  Other Topics Concern  . Not on file  Social History Narrative   Daughter lives with him      One story home  Right handed      Highest level of edu- 2 years of college         Tobacco use, amount per day now:   Past tobacco use, amount per day:1/2 pack a day.   How many years did you use tobacco: 20 years   Alcohol use (drinks per week):   Diet:   Do you drink/eat things with caffeine:   Marital status:                                  What year were you married?   Do you live in a house, apartment, assisted living, condo, trailer, etc.?   Is it one or more stories?   How many persons live in your home?   Do you have pets in your home?( please list)   Highest Level of education completed?   Current or past profession:   Do you exercise?                                  Type and how often?   Do you have a living will? Yes.   Do you have a DNR form?  Yes.                                 If not, do you want to discuss one?   Do you have signed POA/HPOA forms? Yes.                       If so, please bring to you appointment      Do you have any difficulty bathing or dressing yourself? Yes.   Do you have any difficulty preparing food or eating? Yes.   Do you have any difficulty managing your medications? Yes.   Do you have any difficulty managing your finances? Yes.   Do you have any difficulty affording your medications? Yes.   Social Determinants of Health   Financial Resource Strain: Not on file  Food Insecurity: Not on file  Transportation Needs: Not on file  Physical Activity: Not on file  Stress: Not on file  Social Connections: Not on file  Intimate Partner Violence: Not on file    Review of Systems:    Constitutional: No weight loss, fever or chills Cardiovascular: No chest pain Respiratory: No SOB Gastrointestinal: See HPI and  otherwise negative   Physical Exam:  Vital signs: BP (!) 136/54   Pulse 67   Ht 5\' 8"  (1.727 m)   Wt 146 lb (66.2 kg)   BMI 22.20 kg/m   Constitutional:   Pleasant AA male appears to be in NAD, Well developed, Well nourished, alert and cooperative Respiratory: Respirations even and unlabored. Lungs clear to auscultation bilaterally.   No wheezes, crackles, or rhonchi.  Cardiovascular: Normal S1, S2. No MRG. Regular rate and rhythm. No peripheral edema, cyanosis or pallor.  Gastrointestinal:  Soft, nondistended, nontender. No rebound or guarding. Normal bowel sounds. No appreciable masses or hepatomegaly. Rectal: External: No hemorrhoids or lesions; internal: No mass; anoscopy: Residual maroon/dark-colored blood scant throughout the rectum Psychiatric: Demonstrates good judgement and reason without abnormal affect or behaviors.  RELEVANT LABS AND IMAGING: CBC    Component Value Date/Time   WBC 7.7 08/24/2020 1702   RBC 3.42 (  L) 08/24/2020 1702   HGB 10.0 (L) 08/24/2020 1702   HCT 33.0 (L) 08/24/2020 1702   PLT 173 08/24/2020 1702   MCV 96.5 08/24/2020 1702   MCH 29.2 08/24/2020 1702   MCHC 30.3 08/24/2020 1702   RDW 14.3 08/24/2020 1702   LYMPHSABS 1.7 08/24/2020 1702   MONOABS 0.6 08/24/2020 1702   EOSABS 0.2 08/24/2020 1702   BASOSABS 0.0 08/24/2020 1702    CMP     Component Value Date/Time   NA 140 08/24/2020 1702   K 4.6 08/24/2020 1702   CL 113 (H) 08/24/2020 1702   CO2 21 (L) 08/24/2020 1702   GLUCOSE 83 08/24/2020 1702   BUN 22 08/24/2020 1702   CREATININE 0.92 08/24/2020 1702   CALCIUM 8.9 08/24/2020 1702   PROT 6.2 04/19/2020 1641   ALBUMIN 3.9 04/19/2020 1641   AST 23 04/19/2020 1641   ALT 12 04/19/2020 1641   ALKPHOS 86 04/19/2020 1641   BILITOT 0.7 04/19/2020 1641   GFRNONAA >60 08/24/2020 1702   GFRAA >60 12/25/2015 0622    Assessment: 1.  Hematochezia: For the past 3 days, initially quite a large amount of maroon blood, hemoglobin fairly stable  around 10 (04/19/2018 to 10.7); likely diverticular 2.  Chronic anemia: Likely exacerbated by above  Plan: 1.  Recheck CBC and CMP today. 2.  Discussed with the patient and his daughter that I believe this was a diverticular bleed given the amount and how it has slowed over the past couple of days.  Explained that we do not typically intervene as long as it is slowing down and stops on its own, however if he is going to stay in the outpatient world then we need to be monitoring his CBC.  I ordered a recheck today and we will place another order for Monday of next week 08/29/2020.  Reiterated to the daughter that if she sees any increase in bleeding over the next few days/weeks then he needs to go right back to the ER and will likely need admission to be watched.  She verbalized understanding. 3.  Continue Senna 2 tabs daily for now as this helps with constipation. 4.  Explained again that patient is very high risk for any procedures so we will try not to have to do these. 5.  Patient to follow in clinic per recommendations after labs above.  Ellouise Newer, PA-C [facility] 08/26/2020, 2:06 PM  Cc: Sandrea Hughs, NP

## 2020-08-26 NOTE — Telephone Encounter (Signed)
-----   Message from Loralie Champagne, PA-C sent at 08/26/2020  4:48 PM EDT ----- Please try to contact patient by the end of the day.  His hemoglobin is down to 8.1 g compared to 10 g just 2 days ago.  Anderson Malta said he is having probably a diverticular bleed, but it looks like it has slowed.  If he continues to have bleeding over the weekend or has increase in bleeding, etc. then he needs to go to the emergency department, otherwise he needs to return for repeat CBC early next week as she had planned.  Thank you,  Jess

## 2020-08-26 NOTE — Telephone Encounter (Signed)
The pt daughter has been advised of the instructions. She will take the pt to the ED if bleeding continues or increases.  Labs have been entered if he does not have to go for eval at the ED he will come in next week early for CBC.

## 2020-08-26 NOTE — Patient Instructions (Signed)
If you are age 85 or older, your body mass index should be between 23-30. Your Body mass index is 22.2 kg/m. If this is out of the aforementioned range listed, please consider follow up with your Primary Care Provider.  If you are age 72 or younger, your body mass index should be between 19-25. Your Body mass index is 22.2 kg/m. If this is out of the aformentioned range listed, please consider follow up with your Primary Care Provider.   Your provider has requested that you go to the basement level for lab work before leaving today. Press "B" on the elevator. The lab is located at the first door on the left as you exit the elevator.  Due to recent changes in healthcare laws, you may see the results of your imaging and laboratory studies on MyChart before your provider has had a chance to review them.  We understand that in some cases there may be results that are confusing or concerning to you. Not all laboratory results come back in the same time frame and the provider may be waiting for multiple results in order to interpret others.  Please give [place] 48 hours in order for your provider to thoroughly review all the results before contacting the office for clarification of your results.   Thank you for choosing me and [facility].  Ellouise Newer, PA-C

## 2020-08-29 ENCOUNTER — Other Ambulatory Visit (INDEPENDENT_AMBULATORY_CARE_PROVIDER_SITE_OTHER): Payer: Medicare Other

## 2020-08-29 DIAGNOSIS — I2510 Atherosclerotic heart disease of native coronary artery without angina pectoris: Secondary | ICD-10-CM | POA: Diagnosis not present

## 2020-08-29 DIAGNOSIS — E785 Hyperlipidemia, unspecified: Secondary | ICD-10-CM | POA: Diagnosis not present

## 2020-08-29 DIAGNOSIS — K921 Melena: Secondary | ICD-10-CM

## 2020-08-29 DIAGNOSIS — I10 Essential (primary) hypertension: Secondary | ICD-10-CM | POA: Diagnosis not present

## 2020-08-29 DIAGNOSIS — I350 Nonrheumatic aortic (valve) stenosis: Secondary | ICD-10-CM | POA: Diagnosis not present

## 2020-08-29 DIAGNOSIS — I351 Nonrheumatic aortic (valve) insufficiency: Secondary | ICD-10-CM | POA: Diagnosis not present

## 2020-08-29 DIAGNOSIS — I739 Peripheral vascular disease, unspecified: Secondary | ICD-10-CM | POA: Diagnosis not present

## 2020-08-29 LAB — CBC WITH DIFFERENTIAL/PLATELET
Basophils Absolute: 0 10*3/uL (ref 0.0–0.1)
Basophils Relative: 0.3 % (ref 0.0–3.0)
Eosinophils Absolute: 0.3 10*3/uL (ref 0.0–0.7)
Eosinophils Relative: 4.1 % (ref 0.0–5.0)
HCT: 24.9 % — ABNORMAL LOW (ref 39.0–52.0)
Hemoglobin: 8.3 g/dL — ABNORMAL LOW (ref 13.0–17.0)
Lymphocytes Relative: 19.9 % (ref 12.0–46.0)
Lymphs Abs: 1.4 10*3/uL (ref 0.7–4.0)
MCHC: 33.3 g/dL (ref 30.0–36.0)
MCV: 88.6 fl (ref 78.0–100.0)
Monocytes Absolute: 0.5 10*3/uL (ref 0.1–1.0)
Monocytes Relative: 7.6 % (ref 3.0–12.0)
Neutro Abs: 4.7 10*3/uL (ref 1.4–7.7)
Neutrophils Relative %: 68.1 % (ref 43.0–77.0)
Platelets: 275 10*3/uL (ref 150.0–400.0)
RBC: 2.81 Mil/uL — ABNORMAL LOW (ref 4.22–5.81)
RDW: 14.7 % (ref 11.5–15.5)
WBC: 6.9 10*3/uL (ref 4.0–10.5)

## 2020-08-29 LAB — COMPREHENSIVE METABOLIC PANEL
ALT: 11 U/L (ref 0–53)
AST: 17 U/L (ref 0–37)
Albumin: 3.8 g/dL (ref 3.5–5.2)
Alkaline Phosphatase: 76 U/L (ref 39–117)
BUN: 20 mg/dL (ref 6–23)
CO2: 26 mEq/L (ref 19–32)
Calcium: 8.8 mg/dL (ref 8.4–10.5)
Chloride: 109 mEq/L (ref 96–112)
Creatinine, Ser: 1.01 mg/dL (ref 0.40–1.50)
GFR: 67.7 mL/min (ref >60.00)
Glucose, Bld: 82 mg/dL (ref 70–99)
Potassium: 4.3 mEq/L (ref 3.5–5.1)
Sodium: 144 mEq/L (ref 135–145)
Total Bilirubin: 0.6 mg/dL (ref 0.2–1.2)
Total Protein: 5.8 g/dL — ABNORMAL LOW (ref 6.0–8.3)

## 2020-08-30 DIAGNOSIS — I739 Peripheral vascular disease, unspecified: Secondary | ICD-10-CM | POA: Diagnosis not present

## 2020-08-30 DIAGNOSIS — I2510 Atherosclerotic heart disease of native coronary artery without angina pectoris: Secondary | ICD-10-CM | POA: Diagnosis not present

## 2020-08-30 DIAGNOSIS — E785 Hyperlipidemia, unspecified: Secondary | ICD-10-CM | POA: Diagnosis not present

## 2020-08-30 DIAGNOSIS — I351 Nonrheumatic aortic (valve) insufficiency: Secondary | ICD-10-CM | POA: Diagnosis not present

## 2020-08-30 DIAGNOSIS — I350 Nonrheumatic aortic (valve) stenosis: Secondary | ICD-10-CM | POA: Diagnosis not present

## 2020-08-30 DIAGNOSIS — I10 Essential (primary) hypertension: Secondary | ICD-10-CM | POA: Diagnosis not present

## 2020-08-31 DIAGNOSIS — I10 Essential (primary) hypertension: Secondary | ICD-10-CM | POA: Diagnosis not present

## 2020-08-31 DIAGNOSIS — I351 Nonrheumatic aortic (valve) insufficiency: Secondary | ICD-10-CM | POA: Diagnosis not present

## 2020-08-31 DIAGNOSIS — E785 Hyperlipidemia, unspecified: Secondary | ICD-10-CM | POA: Diagnosis not present

## 2020-08-31 DIAGNOSIS — I350 Nonrheumatic aortic (valve) stenosis: Secondary | ICD-10-CM | POA: Diagnosis not present

## 2020-08-31 DIAGNOSIS — I739 Peripheral vascular disease, unspecified: Secondary | ICD-10-CM | POA: Diagnosis not present

## 2020-08-31 DIAGNOSIS — I2510 Atherosclerotic heart disease of native coronary artery without angina pectoris: Secondary | ICD-10-CM | POA: Diagnosis not present

## 2020-09-01 DIAGNOSIS — I351 Nonrheumatic aortic (valve) insufficiency: Secondary | ICD-10-CM | POA: Diagnosis not present

## 2020-09-01 DIAGNOSIS — I10 Essential (primary) hypertension: Secondary | ICD-10-CM | POA: Diagnosis not present

## 2020-09-01 DIAGNOSIS — I2510 Atherosclerotic heart disease of native coronary artery without angina pectoris: Secondary | ICD-10-CM | POA: Diagnosis not present

## 2020-09-01 DIAGNOSIS — I739 Peripheral vascular disease, unspecified: Secondary | ICD-10-CM | POA: Diagnosis not present

## 2020-09-01 DIAGNOSIS — E785 Hyperlipidemia, unspecified: Secondary | ICD-10-CM | POA: Diagnosis not present

## 2020-09-01 DIAGNOSIS — I350 Nonrheumatic aortic (valve) stenosis: Secondary | ICD-10-CM | POA: Diagnosis not present

## 2020-09-02 DIAGNOSIS — I10 Essential (primary) hypertension: Secondary | ICD-10-CM | POA: Diagnosis not present

## 2020-09-02 DIAGNOSIS — I2510 Atherosclerotic heart disease of native coronary artery without angina pectoris: Secondary | ICD-10-CM | POA: Diagnosis not present

## 2020-09-02 DIAGNOSIS — E785 Hyperlipidemia, unspecified: Secondary | ICD-10-CM | POA: Diagnosis not present

## 2020-09-02 DIAGNOSIS — I350 Nonrheumatic aortic (valve) stenosis: Secondary | ICD-10-CM | POA: Diagnosis not present

## 2020-09-02 DIAGNOSIS — I739 Peripheral vascular disease, unspecified: Secondary | ICD-10-CM | POA: Diagnosis not present

## 2020-09-02 DIAGNOSIS — I351 Nonrheumatic aortic (valve) insufficiency: Secondary | ICD-10-CM | POA: Diagnosis not present

## 2020-09-05 DIAGNOSIS — E785 Hyperlipidemia, unspecified: Secondary | ICD-10-CM | POA: Diagnosis not present

## 2020-09-05 DIAGNOSIS — I739 Peripheral vascular disease, unspecified: Secondary | ICD-10-CM | POA: Diagnosis not present

## 2020-09-05 DIAGNOSIS — I351 Nonrheumatic aortic (valve) insufficiency: Secondary | ICD-10-CM | POA: Diagnosis not present

## 2020-09-05 DIAGNOSIS — I2510 Atherosclerotic heart disease of native coronary artery without angina pectoris: Secondary | ICD-10-CM | POA: Diagnosis not present

## 2020-09-05 DIAGNOSIS — I350 Nonrheumatic aortic (valve) stenosis: Secondary | ICD-10-CM | POA: Diagnosis not present

## 2020-09-05 DIAGNOSIS — I10 Essential (primary) hypertension: Secondary | ICD-10-CM | POA: Diagnosis not present

## 2020-09-07 ENCOUNTER — Telehealth: Payer: Self-pay | Admitting: *Deleted

## 2020-09-07 DIAGNOSIS — I739 Peripheral vascular disease, unspecified: Secondary | ICD-10-CM | POA: Diagnosis not present

## 2020-09-07 DIAGNOSIS — I351 Nonrheumatic aortic (valve) insufficiency: Secondary | ICD-10-CM | POA: Diagnosis not present

## 2020-09-07 DIAGNOSIS — E785 Hyperlipidemia, unspecified: Secondary | ICD-10-CM | POA: Diagnosis not present

## 2020-09-07 DIAGNOSIS — I350 Nonrheumatic aortic (valve) stenosis: Secondary | ICD-10-CM | POA: Diagnosis not present

## 2020-09-07 DIAGNOSIS — I10 Essential (primary) hypertension: Secondary | ICD-10-CM | POA: Diagnosis not present

## 2020-09-07 DIAGNOSIS — I2510 Atherosclerotic heart disease of native coronary artery without angina pectoris: Secondary | ICD-10-CM | POA: Diagnosis not present

## 2020-09-07 NOTE — Telephone Encounter (Signed)
[name] [name] with [facility] called and stated that patient is in their [facility]. Stated that patient has an order for Lorazepam 0.5mg  every 4 hours as needed due to restlessness. Stated that daughter gives to patient at least twice during the day and daughter is wanting to know if she can give patient 2 Tablets at night to help rest.   Please Advise if she can give 2 Tablets of Lorazepam 0.5mg  at nighttime. (Forwarded to [facility] due to Webb Silversmith out of office)

## 2020-09-07 NOTE — Telephone Encounter (Signed)
Mast, Man X, NP  You 8 minutes ago (3:23 PM)   We can try it, thanks.    Message text       [name], with [facility],  Notified and agreed.

## 2020-09-08 ENCOUNTER — Other Ambulatory Visit: Payer: Self-pay | Admitting: Family

## 2020-09-08 DIAGNOSIS — G4700 Insomnia, unspecified: Secondary | ICD-10-CM

## 2020-09-09 DIAGNOSIS — I350 Nonrheumatic aortic (valve) stenosis: Secondary | ICD-10-CM | POA: Diagnosis not present

## 2020-09-09 DIAGNOSIS — E785 Hyperlipidemia, unspecified: Secondary | ICD-10-CM | POA: Diagnosis not present

## 2020-09-09 DIAGNOSIS — I739 Peripheral vascular disease, unspecified: Secondary | ICD-10-CM | POA: Diagnosis not present

## 2020-09-09 DIAGNOSIS — I2510 Atherosclerotic heart disease of native coronary artery without angina pectoris: Secondary | ICD-10-CM | POA: Diagnosis not present

## 2020-09-09 DIAGNOSIS — I351 Nonrheumatic aortic (valve) insufficiency: Secondary | ICD-10-CM | POA: Diagnosis not present

## 2020-09-09 DIAGNOSIS — I10 Essential (primary) hypertension: Secondary | ICD-10-CM | POA: Diagnosis not present

## 2020-09-12 DIAGNOSIS — I350 Nonrheumatic aortic (valve) stenosis: Secondary | ICD-10-CM | POA: Diagnosis not present

## 2020-09-12 DIAGNOSIS — I739 Peripheral vascular disease, unspecified: Secondary | ICD-10-CM | POA: Diagnosis not present

## 2020-09-12 DIAGNOSIS — I2510 Atherosclerotic heart disease of native coronary artery without angina pectoris: Secondary | ICD-10-CM | POA: Diagnosis not present

## 2020-09-12 DIAGNOSIS — I351 Nonrheumatic aortic (valve) insufficiency: Secondary | ICD-10-CM | POA: Diagnosis not present

## 2020-09-12 DIAGNOSIS — I10 Essential (primary) hypertension: Secondary | ICD-10-CM | POA: Diagnosis not present

## 2020-09-12 DIAGNOSIS — E785 Hyperlipidemia, unspecified: Secondary | ICD-10-CM | POA: Diagnosis not present

## 2020-09-14 DIAGNOSIS — E785 Hyperlipidemia, unspecified: Secondary | ICD-10-CM | POA: Diagnosis not present

## 2020-09-14 DIAGNOSIS — H409 Unspecified glaucoma: Secondary | ICD-10-CM | POA: Diagnosis not present

## 2020-09-14 DIAGNOSIS — F419 Anxiety disorder, unspecified: Secondary | ICD-10-CM | POA: Diagnosis not present

## 2020-09-14 DIAGNOSIS — I10 Essential (primary) hypertension: Secondary | ICD-10-CM | POA: Diagnosis not present

## 2020-09-14 DIAGNOSIS — K219 Gastro-esophageal reflux disease without esophagitis: Secondary | ICD-10-CM | POA: Diagnosis not present

## 2020-09-14 DIAGNOSIS — M1990 Unspecified osteoarthritis, unspecified site: Secondary | ICD-10-CM | POA: Diagnosis not present

## 2020-09-14 DIAGNOSIS — F0390 Unspecified dementia without behavioral disturbance: Secondary | ICD-10-CM | POA: Diagnosis not present

## 2020-09-14 DIAGNOSIS — D649 Anemia, unspecified: Secondary | ICD-10-CM | POA: Diagnosis not present

## 2020-09-14 DIAGNOSIS — I2510 Atherosclerotic heart disease of native coronary artery without angina pectoris: Secondary | ICD-10-CM | POA: Diagnosis not present

## 2020-09-14 DIAGNOSIS — I739 Peripheral vascular disease, unspecified: Secondary | ICD-10-CM | POA: Diagnosis not present

## 2020-09-14 DIAGNOSIS — I350 Nonrheumatic aortic (valve) stenosis: Secondary | ICD-10-CM | POA: Diagnosis not present

## 2020-09-14 DIAGNOSIS — I351 Nonrheumatic aortic (valve) insufficiency: Secondary | ICD-10-CM | POA: Diagnosis not present

## 2020-09-15 DIAGNOSIS — I739 Peripheral vascular disease, unspecified: Secondary | ICD-10-CM | POA: Diagnosis not present

## 2020-09-15 DIAGNOSIS — I10 Essential (primary) hypertension: Secondary | ICD-10-CM | POA: Diagnosis not present

## 2020-09-15 DIAGNOSIS — I351 Nonrheumatic aortic (valve) insufficiency: Secondary | ICD-10-CM | POA: Diagnosis not present

## 2020-09-15 DIAGNOSIS — I2510 Atherosclerotic heart disease of native coronary artery without angina pectoris: Secondary | ICD-10-CM | POA: Diagnosis not present

## 2020-09-15 DIAGNOSIS — I350 Nonrheumatic aortic (valve) stenosis: Secondary | ICD-10-CM | POA: Diagnosis not present

## 2020-09-15 DIAGNOSIS — E785 Hyperlipidemia, unspecified: Secondary | ICD-10-CM | POA: Diagnosis not present

## 2020-09-16 DIAGNOSIS — I350 Nonrheumatic aortic (valve) stenosis: Secondary | ICD-10-CM | POA: Diagnosis not present

## 2020-09-16 DIAGNOSIS — I739 Peripheral vascular disease, unspecified: Secondary | ICD-10-CM | POA: Diagnosis not present

## 2020-09-16 DIAGNOSIS — E785 Hyperlipidemia, unspecified: Secondary | ICD-10-CM | POA: Diagnosis not present

## 2020-09-16 DIAGNOSIS — I10 Essential (primary) hypertension: Secondary | ICD-10-CM | POA: Diagnosis not present

## 2020-09-16 DIAGNOSIS — I351 Nonrheumatic aortic (valve) insufficiency: Secondary | ICD-10-CM | POA: Diagnosis not present

## 2020-09-16 DIAGNOSIS — I2510 Atherosclerotic heart disease of native coronary artery without angina pectoris: Secondary | ICD-10-CM | POA: Diagnosis not present

## 2020-09-19 DIAGNOSIS — I10 Essential (primary) hypertension: Secondary | ICD-10-CM | POA: Diagnosis not present

## 2020-09-19 DIAGNOSIS — I350 Nonrheumatic aortic (valve) stenosis: Secondary | ICD-10-CM | POA: Diagnosis not present

## 2020-09-19 DIAGNOSIS — E785 Hyperlipidemia, unspecified: Secondary | ICD-10-CM | POA: Diagnosis not present

## 2020-09-19 DIAGNOSIS — I351 Nonrheumatic aortic (valve) insufficiency: Secondary | ICD-10-CM | POA: Diagnosis not present

## 2020-09-19 DIAGNOSIS — I2510 Atherosclerotic heart disease of native coronary artery without angina pectoris: Secondary | ICD-10-CM | POA: Diagnosis not present

## 2020-09-19 DIAGNOSIS — I739 Peripheral vascular disease, unspecified: Secondary | ICD-10-CM | POA: Diagnosis not present

## 2020-09-21 DIAGNOSIS — I739 Peripheral vascular disease, unspecified: Secondary | ICD-10-CM | POA: Diagnosis not present

## 2020-09-21 DIAGNOSIS — I2510 Atherosclerotic heart disease of native coronary artery without angina pectoris: Secondary | ICD-10-CM | POA: Diagnosis not present

## 2020-09-21 DIAGNOSIS — I351 Nonrheumatic aortic (valve) insufficiency: Secondary | ICD-10-CM | POA: Diagnosis not present

## 2020-09-21 DIAGNOSIS — I10 Essential (primary) hypertension: Secondary | ICD-10-CM | POA: Diagnosis not present

## 2020-09-21 DIAGNOSIS — I350 Nonrheumatic aortic (valve) stenosis: Secondary | ICD-10-CM | POA: Diagnosis not present

## 2020-09-21 DIAGNOSIS — E785 Hyperlipidemia, unspecified: Secondary | ICD-10-CM | POA: Diagnosis not present

## 2020-09-22 DIAGNOSIS — I739 Peripheral vascular disease, unspecified: Secondary | ICD-10-CM | POA: Diagnosis not present

## 2020-09-22 DIAGNOSIS — I350 Nonrheumatic aortic (valve) stenosis: Secondary | ICD-10-CM | POA: Diagnosis not present

## 2020-09-22 DIAGNOSIS — I351 Nonrheumatic aortic (valve) insufficiency: Secondary | ICD-10-CM | POA: Diagnosis not present

## 2020-09-22 DIAGNOSIS — I10 Essential (primary) hypertension: Secondary | ICD-10-CM | POA: Diagnosis not present

## 2020-09-22 DIAGNOSIS — I2510 Atherosclerotic heart disease of native coronary artery without angina pectoris: Secondary | ICD-10-CM | POA: Diagnosis not present

## 2020-09-22 DIAGNOSIS — E785 Hyperlipidemia, unspecified: Secondary | ICD-10-CM | POA: Diagnosis not present

## 2020-09-23 DIAGNOSIS — I2510 Atherosclerotic heart disease of native coronary artery without angina pectoris: Secondary | ICD-10-CM | POA: Diagnosis not present

## 2020-09-23 DIAGNOSIS — I350 Nonrheumatic aortic (valve) stenosis: Secondary | ICD-10-CM | POA: Diagnosis not present

## 2020-09-23 DIAGNOSIS — I351 Nonrheumatic aortic (valve) insufficiency: Secondary | ICD-10-CM | POA: Diagnosis not present

## 2020-09-23 DIAGNOSIS — I10 Essential (primary) hypertension: Secondary | ICD-10-CM | POA: Diagnosis not present

## 2020-09-23 DIAGNOSIS — E785 Hyperlipidemia, unspecified: Secondary | ICD-10-CM | POA: Diagnosis not present

## 2020-09-23 DIAGNOSIS — I739 Peripheral vascular disease, unspecified: Secondary | ICD-10-CM | POA: Diagnosis not present

## 2020-09-26 DIAGNOSIS — I2510 Atherosclerotic heart disease of native coronary artery without angina pectoris: Secondary | ICD-10-CM | POA: Diagnosis not present

## 2020-09-26 DIAGNOSIS — I10 Essential (primary) hypertension: Secondary | ICD-10-CM | POA: Diagnosis not present

## 2020-09-26 DIAGNOSIS — I739 Peripheral vascular disease, unspecified: Secondary | ICD-10-CM | POA: Diagnosis not present

## 2020-09-26 DIAGNOSIS — E785 Hyperlipidemia, unspecified: Secondary | ICD-10-CM | POA: Diagnosis not present

## 2020-09-26 DIAGNOSIS — I351 Nonrheumatic aortic (valve) insufficiency: Secondary | ICD-10-CM | POA: Diagnosis not present

## 2020-09-26 DIAGNOSIS — I350 Nonrheumatic aortic (valve) stenosis: Secondary | ICD-10-CM | POA: Diagnosis not present

## 2020-09-28 DIAGNOSIS — I351 Nonrheumatic aortic (valve) insufficiency: Secondary | ICD-10-CM | POA: Diagnosis not present

## 2020-09-28 DIAGNOSIS — E785 Hyperlipidemia, unspecified: Secondary | ICD-10-CM | POA: Diagnosis not present

## 2020-09-28 DIAGNOSIS — I2510 Atherosclerotic heart disease of native coronary artery without angina pectoris: Secondary | ICD-10-CM | POA: Diagnosis not present

## 2020-09-28 DIAGNOSIS — I10 Essential (primary) hypertension: Secondary | ICD-10-CM | POA: Diagnosis not present

## 2020-09-28 DIAGNOSIS — I739 Peripheral vascular disease, unspecified: Secondary | ICD-10-CM | POA: Diagnosis not present

## 2020-09-28 DIAGNOSIS — I350 Nonrheumatic aortic (valve) stenosis: Secondary | ICD-10-CM | POA: Diagnosis not present

## 2020-09-29 DIAGNOSIS — I351 Nonrheumatic aortic (valve) insufficiency: Secondary | ICD-10-CM | POA: Diagnosis not present

## 2020-09-29 DIAGNOSIS — I739 Peripheral vascular disease, unspecified: Secondary | ICD-10-CM | POA: Diagnosis not present

## 2020-09-29 DIAGNOSIS — E785 Hyperlipidemia, unspecified: Secondary | ICD-10-CM | POA: Diagnosis not present

## 2020-09-29 DIAGNOSIS — I350 Nonrheumatic aortic (valve) stenosis: Secondary | ICD-10-CM | POA: Diagnosis not present

## 2020-09-29 DIAGNOSIS — I10 Essential (primary) hypertension: Secondary | ICD-10-CM | POA: Diagnosis not present

## 2020-09-29 DIAGNOSIS — I2510 Atherosclerotic heart disease of native coronary artery without angina pectoris: Secondary | ICD-10-CM | POA: Diagnosis not present

## 2020-09-30 DIAGNOSIS — I2510 Atherosclerotic heart disease of native coronary artery without angina pectoris: Secondary | ICD-10-CM | POA: Diagnosis not present

## 2020-09-30 DIAGNOSIS — I350 Nonrheumatic aortic (valve) stenosis: Secondary | ICD-10-CM | POA: Diagnosis not present

## 2020-09-30 DIAGNOSIS — I351 Nonrheumatic aortic (valve) insufficiency: Secondary | ICD-10-CM | POA: Diagnosis not present

## 2020-09-30 DIAGNOSIS — I10 Essential (primary) hypertension: Secondary | ICD-10-CM | POA: Diagnosis not present

## 2020-09-30 DIAGNOSIS — I739 Peripheral vascular disease, unspecified: Secondary | ICD-10-CM | POA: Diagnosis not present

## 2020-09-30 DIAGNOSIS — E785 Hyperlipidemia, unspecified: Secondary | ICD-10-CM | POA: Diagnosis not present

## 2020-10-03 ENCOUNTER — Other Ambulatory Visit: Payer: 59

## 2020-10-03 DIAGNOSIS — I351 Nonrheumatic aortic (valve) insufficiency: Secondary | ICD-10-CM | POA: Diagnosis not present

## 2020-10-03 DIAGNOSIS — I2510 Atherosclerotic heart disease of native coronary artery without angina pectoris: Secondary | ICD-10-CM | POA: Diagnosis not present

## 2020-10-03 DIAGNOSIS — I350 Nonrheumatic aortic (valve) stenosis: Secondary | ICD-10-CM | POA: Diagnosis not present

## 2020-10-03 DIAGNOSIS — I739 Peripheral vascular disease, unspecified: Secondary | ICD-10-CM | POA: Diagnosis not present

## 2020-10-03 DIAGNOSIS — E785 Hyperlipidemia, unspecified: Secondary | ICD-10-CM | POA: Diagnosis not present

## 2020-10-03 DIAGNOSIS — I10 Essential (primary) hypertension: Secondary | ICD-10-CM | POA: Diagnosis not present

## 2020-10-05 DIAGNOSIS — I350 Nonrheumatic aortic (valve) stenosis: Secondary | ICD-10-CM | POA: Diagnosis not present

## 2020-10-05 DIAGNOSIS — I10 Essential (primary) hypertension: Secondary | ICD-10-CM | POA: Diagnosis not present

## 2020-10-05 DIAGNOSIS — E785 Hyperlipidemia, unspecified: Secondary | ICD-10-CM | POA: Diagnosis not present

## 2020-10-05 DIAGNOSIS — I739 Peripheral vascular disease, unspecified: Secondary | ICD-10-CM | POA: Diagnosis not present

## 2020-10-05 DIAGNOSIS — I351 Nonrheumatic aortic (valve) insufficiency: Secondary | ICD-10-CM | POA: Diagnosis not present

## 2020-10-05 DIAGNOSIS — I2510 Atherosclerotic heart disease of native coronary artery without angina pectoris: Secondary | ICD-10-CM | POA: Diagnosis not present

## 2020-10-06 ENCOUNTER — Other Ambulatory Visit: Payer: Self-pay

## 2020-10-06 ENCOUNTER — Encounter: Payer: Self-pay | Admitting: Family

## 2020-10-06 ENCOUNTER — Ambulatory Visit (INDEPENDENT_AMBULATORY_CARE_PROVIDER_SITE_OTHER): Payer: Medicare Other | Admitting: Family

## 2020-10-06 VITALS — BP 140/74 | HR 85 | Temp 97.7°F | Resp 16 | Ht 68.0 in | Wt 150.0 lb

## 2020-10-06 DIAGNOSIS — Z23 Encounter for immunization: Secondary | ICD-10-CM

## 2020-10-06 DIAGNOSIS — I130 Hypertensive heart and chronic kidney disease with heart failure and stage 1 through stage 4 chronic kidney disease, or unspecified chronic kidney disease: Secondary | ICD-10-CM

## 2020-10-06 DIAGNOSIS — I482 Chronic atrial fibrillation, unspecified: Secondary | ICD-10-CM | POA: Diagnosis not present

## 2020-10-06 DIAGNOSIS — E1122 Type 2 diabetes mellitus with diabetic chronic kidney disease: Secondary | ICD-10-CM

## 2020-10-06 DIAGNOSIS — I10 Essential (primary) hypertension: Secondary | ICD-10-CM | POA: Diagnosis not present

## 2020-10-06 DIAGNOSIS — I5032 Chronic diastolic (congestive) heart failure: Secondary | ICD-10-CM

## 2020-10-06 DIAGNOSIS — E785 Hyperlipidemia, unspecified: Secondary | ICD-10-CM

## 2020-10-06 DIAGNOSIS — I739 Peripheral vascular disease, unspecified: Secondary | ICD-10-CM

## 2020-10-06 DIAGNOSIS — I2510 Atherosclerotic heart disease of native coronary artery without angina pectoris: Secondary | ICD-10-CM | POA: Diagnosis not present

## 2020-10-06 DIAGNOSIS — G301 Alzheimer's disease with late onset: Secondary | ICD-10-CM | POA: Diagnosis not present

## 2020-10-06 DIAGNOSIS — N181 Chronic kidney disease, stage 1: Secondary | ICD-10-CM | POA: Diagnosis not present

## 2020-10-06 DIAGNOSIS — F0280 Dementia in other diseases classified elsewhere without behavioral disturbance: Secondary | ICD-10-CM | POA: Diagnosis not present

## 2020-10-06 DIAGNOSIS — I351 Nonrheumatic aortic (valve) insufficiency: Secondary | ICD-10-CM | POA: Diagnosis not present

## 2020-10-06 DIAGNOSIS — I350 Nonrheumatic aortic (valve) stenosis: Secondary | ICD-10-CM | POA: Diagnosis not present

## 2020-10-06 LAB — COMPLETE METABOLIC PANEL WITH GFR
AG Ratio: 2.4 (calc) (ref 1.0–2.5)
ALT: 10 U/L (ref 9–46)
AST: 18 U/L (ref 10–35)
Albumin: 4 g/dL (ref 3.6–5.1)
Alkaline phosphatase (APISO): 79 U/L (ref 35–144)
BUN: 15 mg/dL (ref 7–25)
CO2: 25 mmol/L (ref 20–32)
Calcium: 9 mg/dL (ref 8.6–10.3)
Chloride: 109 mmol/L (ref 98–110)
Creat: 0.89 mg/dL (ref 0.70–1.11)
GFR, Est African American: 90 mL/min/{1.73_m2} (ref >=60)
GFR, Est Non African American: 78 mL/min/{1.73_m2} (ref >=60)
Globulin: 1.7 g/dL (calc) — ABNORMAL LOW (ref 1.9–3.7)
Glucose, Bld: 68 mg/dL (ref 65–99)
Potassium: 4.4 mmol/L (ref 3.5–5.3)
Sodium: 141 mmol/L (ref 135–146)
Total Bilirubin: 0.8 mg/dL (ref 0.2–1.2)
Total Protein: 5.7 g/dL — ABNORMAL LOW (ref 6.1–8.1)

## 2020-10-06 LAB — CBC WITH DIFFERENTIAL/PLATELET
Absolute Monocytes: 496 cells/uL (ref 200–950)
Basophils Absolute: 30 cells/uL (ref 0–200)
Basophils Relative: 0.5 %
Eosinophils Absolute: 407 cells/uL (ref 15–500)
Eosinophils Relative: 6.9 %
HCT: 28.5 % — ABNORMAL LOW (ref 38.5–50.0)
Hemoglobin: 8.2 g/dL — ABNORMAL LOW (ref 13.2–17.1)
Lymphs Abs: 1746 cells/uL (ref 850–3900)
MCH: 22.7 pg — ABNORMAL LOW (ref 27.0–33.0)
MCHC: 28.8 g/dL — ABNORMAL LOW (ref 32.0–36.0)
MCV: 78.9 fL — ABNORMAL LOW (ref 80.0–100.0)
MPV: 12.2 fL (ref 7.5–12.5)
Monocytes Relative: 8.4 %
Neutro Abs: 3221 cells/uL (ref 1500–7800)
Neutrophils Relative %: 54.6 %
Platelets: 264 10*3/uL (ref 140–400)
RBC: 3.61 10*6/uL — ABNORMAL LOW (ref 4.20–5.80)
RDW: 16.9 % — ABNORMAL HIGH (ref 11.0–15.0)
Total Lymphocyte: 29.6 %
WBC: 5.9 10*3/uL (ref 3.8–10.8)

## 2020-10-06 LAB — TSH: TSH: 0.88 mIU/L (ref 0.40–4.50)

## 2020-10-06 LAB — LIPID PANEL
Cholesterol: 263 mg/dL — ABNORMAL HIGH (ref <200)
HDL: 73 mg/dL (ref >=40)
LDL Cholesterol (Calc): 172 mg/dL (calc) — ABNORMAL HIGH
Non-HDL Cholesterol (Calc): 190 mg/dL (calc) — ABNORMAL HIGH (ref <130)
Total CHOL/HDL Ratio: 3.6 (calc) (ref <5.0)
Triglycerides: 79 mg/dL (ref <150)

## 2020-10-06 MED ORDER — TETANUS-DIPHTH-ACELL PERTUSSIS 5-2.5-18.5 LF-MCG/0.5 IM SUSP: 0.5000 mL | Freq: Once | INTRAMUSCULAR | 0 refills | Status: AC

## 2020-10-06 NOTE — Patient Instructions (Signed)
-   [facility]   - Also get COVID-19 booster vaccine   - Your are also due for your Shingles vaccine.please get Shingrix vaccine at your pharmacy   -Labs done today will call you with results

## 2020-10-06 NOTE — Progress Notes (Signed)
Provider: Marlowe Sax FNP-C   Estoria Geary, Nelda Bucks, NP  Patient Care Team: Garyn Arlotta, Nelda Bucks, NP as PCP - General (Family Medicine) Cameron Sprang, MD as Consulting Physician (Neurology) O'Neal, Cassie Freer, MD as Consulting Physician (Internal Medicine)  Extended Emergency Contact Information Primary Emergency Contact: [place] of [place] Phone: [phone] Relation: Daughter Secondary Emergency Contact: [place] of [place] Mobile Phone: [phone] Relation: Daughter  Code Status:  Full Code  Goals of care: Advanced Directive information Advanced Directives 10/06/2020  Does Patient Have a Medical Advance Directive? No  Type of Advance Directive -  Does patient want to make changes to medical advance directive? -  Would patient like information on creating a medical advance directive? No - Patient declined  Pre-existing out of facility DNR order (yellow form or pink MOST form) -     Chief Complaint  Patient presents with   Medical Management of Chronic Issues    4 month follow up.   Health Maintenance    Discuss the need for Eye exam, and Urine Microalbumin.    Immunizations    Discuss the need for Shingrix vaccine, Tetanus vaccine, and 2nd Covid Booster.    HPI:  Pt is a 87 y.o. male seen today for 4 month follow up for medical management of chronic diseases.He is here with wife    Hypertension - no home blood pressure readings for review.b/p elevated on arrival but wife states due to rushing to appointment this morning.recheck B/p after resting was normal.denies any headache,dizziness,vision changes,fatigue,chest tightness,palpitation,chest pain or shortness of breath.      CHF - denies any cough,shortness of breath or edema.has had no abrupt weight gain.continue to take furosemide every other day.   Chronic Afib - one or two palpitation.denies any chest pain   Due for Tdap vaccine - discussed with wife to  [facility].   Shingrix vaccine - wife not sure weather POA would want get shingles vaccine.has never had shingles.  2nd COVID booster  vaccine - will check with daughter if he has had booster vaccine.     Past Medical History:  Diagnosis Date   Anxiety    Aortic stenosis    Arthritis    Carotid artery disease ([place])    Coronary artery disease    sees Dr. Verl Blalock   Depression    DJD (degenerative joint disease)    right hip   GERD (gastroesophageal reflux disease)    hx of   Glaucoma    Glucose intolerance (impaired glucose tolerance)    Headache(784.0)    hx of migraines   Herpes zoster 2009   r. scalp and opth   Hx of colonic polyps 2004   hyperplastic   Hyperlipidemia    Hypertension    sees Dr. Adelene Amas   Internal and external hemorrhoids without complication    Low back pain    facet arthropathy   Lumbago    Peripheral vascular disease ([place])    Pneumonia 2009   rll with a small effusion   Postherpetic neuralgia 2009   Past Surgical History:  Procedure Laterality Date   ACNE CYST REMOVAL  1970's   removed x2 off the back   CARPAL TUNNEL RELEASE     left   CATARACT EXTRACTION W/PHACO  02/13/2012   Procedure: CATARACT EXTRACTION PHACO AND INTRAOCULAR LENS PLACEMENT ([place]);  Surgeon: Adonis Brook, MD;  Location: [facility];  Service: Ophthalmology;  Laterality: Left;  COLONOSCOPY     CORONARY ARTERY BYPASS GRAFT  2002   REPOSITION OF LENS  03/14/2012   Procedure: REPOSITION OF LENS;  Surgeon: Adonis Brook, MD;  Location: [facility];  Service: Ophthalmology;  Laterality: Left;  Reposition Ocular Lens Left Eye   thumb tendon surgery     left   TONSILLECTOMY     age 75    Allergies  Allergen Reactions   Atorvastatin Other (See Comments)    Muscle aches Muscle aches Muscle aches   Benazepril Hcl Cough   Cosopt [Dorzolamide Hcl-Timolol Mal] Other (See Comments)    Eyes water   Ezetimibe-Simvastatin Other (See Comments)     REACTION: aches REACTION: aches REACTION: aches   Aricept [Donepezil Hcl]     hallucinations   Norvasc [Amlodipine Besylate]     swelling   Zoloft [Sertraline]     Allergies as of 10/06/2020       Reactions   Atorvastatin Other (See Comments)   Muscle aches Muscle aches Muscle aches   Benazepril Hcl Cough   Cosopt [dorzolamide Hcl-timolol Mal] Other (See Comments)   Eyes water   Ezetimibe-simvastatin Other (See Comments)   REACTION: aches REACTION: aches REACTION: aches   Aricept [donepezil Hcl]    hallucinations   Norvasc [amlodipine Besylate]    swelling   Zoloft [sertraline]         Medication List        Accurate as of October 06, 2020 12:31 PM. If you have any questions, ask your nurse or doctor.          acetaminophen 500 MG tablet Commonly known as: TYLENOL Take 1 tablet (500 mg total) by mouth every 6 (six) hours as needed.   Allergy 25 MG tablet Generic drug: diphenhydrAMINE Take 25 mg by mouth every 4 (four) hours as needed for allergies.   [facility] Melatonin 3 MG Tabs tablet Generic drug: melatonin TAKE 1 TABLET (3 MG TOTAL) BY MOUTH AT BEDTIME AS NEEDED.   [facility] Senna 8.6 MG tablet Generic drug: senna Take 2 tablets by mouth daily.   ferrous sulfate 325 (65 FE) MG tablet Take 650 mg by mouth daily with breakfast.   furosemide 20 MG tablet Commonly known as: LASIX Take 20 mg by mouth every other day. What changed: Another medication with the same name was removed. Continue taking this medication, and follow the directions you see here. Changed by: Sandrea Hughs, NP   gabapentin 100 MG capsule Commonly known as: NEURONTIN Take 100-300 mg by mouth as needed (nerve pain).   LORazepam 0.5 MG tablet Commonly known as: ATIVAN Take 0.5 mg by mouth every 4 (four) hours as needed for anxiety.   Tdap 5-2.5-18.5 LF-MCG/0.5 injection Commonly known as: BOOSTRIX Inject 0.5 mLs into the muscle once for 1 dose.   Travatan Z 0.004 % Soln ophthalmic  solution Generic drug: Travoprost (BAK Free) Place 1 drop into both eyes daily.   Vitamin D3 50 MCG (2000 UT) capsule Take 1 capsule (2,000 Units total) by mouth daily.        Review of Systems  Constitutional:  Negative for appetite change, chills, fatigue, fever and unexpected weight change.  HENT:  Negative for congestion, dental problem, ear discharge, ear pain, facial swelling, hearing loss, nosebleeds, postnasal drip, rhinorrhea, sinus pressure, sinus pain, sneezing, sore throat, tinnitus and trouble swallowing.   Eyes:  Positive for visual disturbance. Negative for pain, discharge, redness and itching.       Follow with Ophthalmologist Dr.Roy whitaker  Respiratory:  Negative for cough, chest tightness, shortness of breath and wheezing.   Cardiovascular:  Negative for chest pain and leg swelling.       Palpitation sometimes   Gastrointestinal:  Negative for abdominal distention, abdominal pain, blood in stool, constipation, diarrhea, nausea and vomiting.  Endocrine: Negative for cold intolerance, heat intolerance, polydipsia, polyphagia and polyuria.  Genitourinary:  Negative for difficulty urinating, dysuria, flank pain, frequency and urgency.       Has accident sometimes wears depends but still makes it to the bathroom   Musculoskeletal:  Positive for gait problem. Negative for arthralgias, back pain, joint swelling, myalgias, neck pain and neck stiffness.  Skin:  Negative for color change, pallor, rash and wound.  Neurological:  Negative for dizziness, syncope, speech difficulty, weakness, light-headedness, numbness and headaches.  Hematological:  Does not bruise/bleed easily.  Psychiatric/Behavioral:  Negative for agitation, behavioral problems, confusion, hallucinations, self-injury, sleep disturbance and suicidal ideas. The patient is not nervous/anxious.        Forgetful    Immunization History  Administered Date(s) Administered   Influenza, High Dose Seasonal PF  01/16/2017   Janssen (J&J) SARS-COV-2 Vaccination 09/18/2019   Pneumococcal Conjugate-13 07/14/2014   Pneumococcal Polysaccharide-23 08/17/2015   Td 10/03/2009   Pertinent  Health Maintenance Due  Topic Date Due   OPHTHALMOLOGY EXAM  Never done   URINE MICROALBUMIN  Never done   INFLUENZA VACCINE  11/14/2020   FOOT EXAM  04/26/2021   PNA vac Low Risk Adult  Completed   Fall Risk  10/06/2020 06/08/2020 05/14/2019 10/23/2018 07/23/2017  Falls in the past year? 0 0 0 0 Yes  Number falls in past yr: 0 0 0 0 1  Injury with Fall? 0 0 0 0 Yes  Risk for fall due to : No Fall Risks - - - -  Follow up - - - Falls evaluation completed -   Functional Status Survey:    Vitals:   10/06/20 1037 10/06/20 1121  BP: (!) 160/70 140/74  Pulse: 85   Resp: 16   Temp: 97.7 F (36.5 C)   SpO2: 98%   Weight: 150 lb (68 kg)   Height: 5\' 8"  (1.727 m)    Body mass index is 22.81 kg/m. Physical Exam Vitals reviewed.  Constitutional:      General: He is not in acute distress.    Appearance: Normal appearance. He is normal weight. He is not ill-appearing or diaphoretic.  HENT:     Head: Normocephalic.     Right Ear: Tympanic membrane, ear canal and external ear normal. There is no impacted cerumen.     Left Ear: Tympanic membrane, ear canal and external ear normal. There is no impacted cerumen.     Nose: Nose normal. No congestion or rhinorrhea.     Mouth/Throat:     Mouth: Mucous membranes are moist.     Pharynx: Oropharynx is clear. No oropharyngeal exudate or posterior oropharyngeal erythema.  Eyes:     General: No scleral icterus.       Right eye: No discharge.        Left eye: No discharge.     Extraocular Movements: Extraocular movements intact.     Conjunctiva/sclera: Conjunctivae normal.     Pupils: Pupils are equal, round, and reactive to light.  Neck:     Vascular: No carotid bruit.  Cardiovascular:     Rate and Rhythm: Normal rate and regular rhythm.     Pulses: Normal pulses.  Heart sounds: Murmur heard.    No friction rub. No gallop.  Pulmonary:     Effort: Pulmonary effort is normal. No respiratory distress.     Breath sounds: Normal breath sounds. No wheezing, rhonchi or rales.  Chest:     Chest wall: No tenderness.  Abdominal:     General: Bowel sounds are normal. There is no distension.     Palpations: Abdomen is soft. There is no mass.     Tenderness: There is no abdominal tenderness. There is no right CVA tenderness, left CVA tenderness, guarding or rebound.  Musculoskeletal:        General: No swelling or tenderness.     Cervical back: Normal range of motion. No rigidity or tenderness.     Right lower leg: No edema.     Left lower leg: No edema.     Comments: Unsteady gait walks with a cane   Lymphadenopathy:     Cervical: No cervical adenopathy.  Skin:    General: Skin is warm and dry.     Coloration: Skin is not pale.     Findings: No bruising, erythema, lesion or rash.     Comments: Brownish skin color changes on both lower extremities.  Neurological:     Mental Status: He is alert and oriented to person, place, and time.     Cranial Nerves: No cranial nerve deficit.     Sensory: No sensory deficit.     Motor: No weakness.     Coordination: Coordination normal.     Gait: Gait normal.  Psychiatric:        Mood and Affect: Mood normal.        Speech: Speech normal.        Behavior: Behavior normal.        Thought Content: Thought content normal.        Cognition and Memory: Cognition is impaired. Memory is impaired.        Judgment: Judgment normal.    Labs reviewed: Recent Labs    08/24/20 1702 08/26/20 1434 08/29/20 1237  NA 140 143 144  K 4.6 4.4 4.3  CL 113* 109 109  CO2 21* 25 26  GLUCOSE 83 84 82  BUN 22 22 20   CREATININE 0.92 1.00 1.01  CALCIUM 8.9 8.7 8.8   Recent Labs    04/19/20 1641 08/26/20 1434 08/29/20 1237  AST 23 18 17   ALT 12 11 11   ALKPHOS 86 70 76  BILITOT 0.7 0.7 0.6  PROT 6.2 5.6* 5.8*  ALBUMIN  3.9 3.6 3.8   Recent Labs    08/24/20 1702 08/26/20 1434 08/29/20 1237  WBC 7.7 6.8 6.9  NEUTROABS 5.1 4.2 4.7  HGB 10.0* 8.1 Repeated and verified X2.* 8.3 Repeated and verified X2.*  HCT 33.0* 24.5 Repeated and verified X2.* 24.9*  MCV 96.5 89.3 88.6  PLT 173 218.0 275.0   Lab Results  Component Value Date   TSH 1.29 08/04/2019   Lab Results  Component Value Date   HGBA1C 5.6 12/09/2013   Lab Results  Component Value Date   CHOL 273 (H) 04/28/2019   HDL 70.10 04/28/2019   LDLCALC 192 (H) 04/28/2019   LDLDIRECT 136.2 09/28/2009   TRIG 54.0 04/28/2019   CHOLHDL 4 04/28/2019    Significant Diagnostic Results in last 30 days:  No results found.  Assessment/Plan 1. Benign hypertensive heart and renal disease with renal failure, stage 1 through stage 4 or unspecified chronic kidney disease, with heart failure (  [place]) B/p elevated on arrival but rechecked was at [place]. Continue on furosemide   2. Hyperlipidemia LDL goal <100 Latest LDL not at goal  - continue with dietary modification and exercise as tolerated  Allergic to statin   3. Late onset Alzheimer's dementia without behavioral disturbance ([place]) No new behavioral issues reported. - continue on lorazepam  - continue with supportive care   4. Chronic diastolic heart failure (H[facility]) No signs of fluid overload - continue furosemide every other day   5. Chronic a-fib ([facility]) Off anticoagulant due to high risk for falls  6. Need for Tdap vaccination Advised to get Tdap at his pharmacy  - Tdap ([place]) 5-2.5-18.5 LF-MCG/0.5 injection; Inject 0.5 mLs into the muscle once for 1 dose.  Dispense: 0.5 mL; Refill: 0  7. Controlled type 2 diabetes mellitus with stage 1 chronic kidney disease, without long-term current use of insulin ([place]) Lab Results  Component Value Date   HGBA1C 5.6 12/09/2013   Well controlled.continue on dietary modification and exercise as tolerated  - Microalbumin / creatinine urine ratio  8.  Peripheral arterial disease (HCC) Bilateral lower extremities chronic brownish skin changes. No ulcer.   Family/ staff Communication: Reviewed plan of care with patient and wife   Labs/tests ordered: - Microalbumin / creatinine urine ratio has routine lab orders to be done today.   Next Appointment : 6 months for medical management of chronic issues.   Sandrea Hughs, NP

## 2020-10-07 DIAGNOSIS — I10 Essential (primary) hypertension: Secondary | ICD-10-CM | POA: Diagnosis not present

## 2020-10-07 DIAGNOSIS — I351 Nonrheumatic aortic (valve) insufficiency: Secondary | ICD-10-CM | POA: Diagnosis not present

## 2020-10-07 DIAGNOSIS — I2510 Atherosclerotic heart disease of native coronary artery without angina pectoris: Secondary | ICD-10-CM | POA: Diagnosis not present

## 2020-10-07 DIAGNOSIS — I739 Peripheral vascular disease, unspecified: Secondary | ICD-10-CM | POA: Diagnosis not present

## 2020-10-07 DIAGNOSIS — I350 Nonrheumatic aortic (valve) stenosis: Secondary | ICD-10-CM | POA: Diagnosis not present

## 2020-10-07 DIAGNOSIS — E785 Hyperlipidemia, unspecified: Secondary | ICD-10-CM | POA: Diagnosis not present

## 2020-10-07 LAB — MICROALBUMIN / CREATININE URINE RATIO
Creatinine, Urine: 96 mg/dL (ref 20–320)
Microalb Creat Ratio: 3 mcg/mg creat (ref <30)
Microalb, Ur: 0.3 mg/dL

## 2020-10-10 DIAGNOSIS — I350 Nonrheumatic aortic (valve) stenosis: Secondary | ICD-10-CM | POA: Diagnosis not present

## 2020-10-10 DIAGNOSIS — I739 Peripheral vascular disease, unspecified: Secondary | ICD-10-CM | POA: Diagnosis not present

## 2020-10-10 DIAGNOSIS — I351 Nonrheumatic aortic (valve) insufficiency: Secondary | ICD-10-CM | POA: Diagnosis not present

## 2020-10-10 DIAGNOSIS — I10 Essential (primary) hypertension: Secondary | ICD-10-CM | POA: Diagnosis not present

## 2020-10-10 DIAGNOSIS — E785 Hyperlipidemia, unspecified: Secondary | ICD-10-CM | POA: Diagnosis not present

## 2020-10-10 DIAGNOSIS — I2510 Atherosclerotic heart disease of native coronary artery without angina pectoris: Secondary | ICD-10-CM | POA: Diagnosis not present

## 2020-10-12 DIAGNOSIS — I2510 Atherosclerotic heart disease of native coronary artery without angina pectoris: Secondary | ICD-10-CM | POA: Diagnosis not present

## 2020-10-12 DIAGNOSIS — I739 Peripheral vascular disease, unspecified: Secondary | ICD-10-CM | POA: Diagnosis not present

## 2020-10-12 DIAGNOSIS — E785 Hyperlipidemia, unspecified: Secondary | ICD-10-CM | POA: Diagnosis not present

## 2020-10-12 DIAGNOSIS — I351 Nonrheumatic aortic (valve) insufficiency: Secondary | ICD-10-CM | POA: Diagnosis not present

## 2020-10-12 DIAGNOSIS — I10 Essential (primary) hypertension: Secondary | ICD-10-CM | POA: Diagnosis not present

## 2020-10-12 DIAGNOSIS — I350 Nonrheumatic aortic (valve) stenosis: Secondary | ICD-10-CM | POA: Diagnosis not present

## 2020-10-13 DIAGNOSIS — I2510 Atherosclerotic heart disease of native coronary artery without angina pectoris: Secondary | ICD-10-CM | POA: Diagnosis not present

## 2020-10-13 DIAGNOSIS — I739 Peripheral vascular disease, unspecified: Secondary | ICD-10-CM | POA: Diagnosis not present

## 2020-10-13 DIAGNOSIS — I351 Nonrheumatic aortic (valve) insufficiency: Secondary | ICD-10-CM | POA: Diagnosis not present

## 2020-10-13 DIAGNOSIS — I350 Nonrheumatic aortic (valve) stenosis: Secondary | ICD-10-CM | POA: Diagnosis not present

## 2020-10-13 DIAGNOSIS — E785 Hyperlipidemia, unspecified: Secondary | ICD-10-CM | POA: Diagnosis not present

## 2020-10-13 DIAGNOSIS — I10 Essential (primary) hypertension: Secondary | ICD-10-CM | POA: Diagnosis not present

## 2020-10-14 DIAGNOSIS — E785 Hyperlipidemia, unspecified: Secondary | ICD-10-CM | POA: Diagnosis not present

## 2020-10-14 DIAGNOSIS — I739 Peripheral vascular disease, unspecified: Secondary | ICD-10-CM | POA: Diagnosis not present

## 2020-10-14 DIAGNOSIS — M1990 Unspecified osteoarthritis, unspecified site: Secondary | ICD-10-CM | POA: Diagnosis not present

## 2020-10-14 DIAGNOSIS — I10 Essential (primary) hypertension: Secondary | ICD-10-CM | POA: Diagnosis not present

## 2020-10-14 DIAGNOSIS — I350 Nonrheumatic aortic (valve) stenosis: Secondary | ICD-10-CM | POA: Diagnosis not present

## 2020-10-14 DIAGNOSIS — D649 Anemia, unspecified: Secondary | ICD-10-CM | POA: Diagnosis not present

## 2020-10-14 DIAGNOSIS — I351 Nonrheumatic aortic (valve) insufficiency: Secondary | ICD-10-CM | POA: Diagnosis not present

## 2020-10-14 DIAGNOSIS — H409 Unspecified glaucoma: Secondary | ICD-10-CM | POA: Diagnosis not present

## 2020-10-14 DIAGNOSIS — I2510 Atherosclerotic heart disease of native coronary artery without angina pectoris: Secondary | ICD-10-CM | POA: Diagnosis not present

## 2020-10-14 DIAGNOSIS — K219 Gastro-esophageal reflux disease without esophagitis: Secondary | ICD-10-CM | POA: Diagnosis not present

## 2020-10-14 DIAGNOSIS — F0390 Unspecified dementia without behavioral disturbance: Secondary | ICD-10-CM | POA: Diagnosis not present

## 2020-10-14 DIAGNOSIS — F419 Anxiety disorder, unspecified: Secondary | ICD-10-CM | POA: Diagnosis not present

## 2020-10-17 DIAGNOSIS — I2510 Atherosclerotic heart disease of native coronary artery without angina pectoris: Secondary | ICD-10-CM | POA: Diagnosis not present

## 2020-10-17 DIAGNOSIS — E785 Hyperlipidemia, unspecified: Secondary | ICD-10-CM | POA: Diagnosis not present

## 2020-10-17 DIAGNOSIS — I739 Peripheral vascular disease, unspecified: Secondary | ICD-10-CM | POA: Diagnosis not present

## 2020-10-17 DIAGNOSIS — I351 Nonrheumatic aortic (valve) insufficiency: Secondary | ICD-10-CM | POA: Diagnosis not present

## 2020-10-17 DIAGNOSIS — I350 Nonrheumatic aortic (valve) stenosis: Secondary | ICD-10-CM | POA: Diagnosis not present

## 2020-10-17 DIAGNOSIS — I10 Essential (primary) hypertension: Secondary | ICD-10-CM | POA: Diagnosis not present

## 2020-10-19 DIAGNOSIS — I739 Peripheral vascular disease, unspecified: Secondary | ICD-10-CM | POA: Diagnosis not present

## 2020-10-19 DIAGNOSIS — I2510 Atherosclerotic heart disease of native coronary artery without angina pectoris: Secondary | ICD-10-CM | POA: Diagnosis not present

## 2020-10-19 DIAGNOSIS — E785 Hyperlipidemia, unspecified: Secondary | ICD-10-CM | POA: Diagnosis not present

## 2020-10-19 DIAGNOSIS — I351 Nonrheumatic aortic (valve) insufficiency: Secondary | ICD-10-CM | POA: Diagnosis not present

## 2020-10-19 DIAGNOSIS — I10 Essential (primary) hypertension: Secondary | ICD-10-CM | POA: Diagnosis not present

## 2020-10-19 DIAGNOSIS — I350 Nonrheumatic aortic (valve) stenosis: Secondary | ICD-10-CM | POA: Diagnosis not present

## 2020-10-20 DIAGNOSIS — I739 Peripheral vascular disease, unspecified: Secondary | ICD-10-CM | POA: Diagnosis not present

## 2020-10-20 DIAGNOSIS — I351 Nonrheumatic aortic (valve) insufficiency: Secondary | ICD-10-CM | POA: Diagnosis not present

## 2020-10-20 DIAGNOSIS — I10 Essential (primary) hypertension: Secondary | ICD-10-CM | POA: Diagnosis not present

## 2020-10-20 DIAGNOSIS — E785 Hyperlipidemia, unspecified: Secondary | ICD-10-CM | POA: Diagnosis not present

## 2020-10-20 DIAGNOSIS — I350 Nonrheumatic aortic (valve) stenosis: Secondary | ICD-10-CM | POA: Diagnosis not present

## 2020-10-20 DIAGNOSIS — I2510 Atherosclerotic heart disease of native coronary artery without angina pectoris: Secondary | ICD-10-CM | POA: Diagnosis not present

## 2020-10-21 DIAGNOSIS — I350 Nonrheumatic aortic (valve) stenosis: Secondary | ICD-10-CM | POA: Diagnosis not present

## 2020-10-21 DIAGNOSIS — I2510 Atherosclerotic heart disease of native coronary artery without angina pectoris: Secondary | ICD-10-CM | POA: Diagnosis not present

## 2020-10-21 DIAGNOSIS — I739 Peripheral vascular disease, unspecified: Secondary | ICD-10-CM | POA: Diagnosis not present

## 2020-10-21 DIAGNOSIS — I10 Essential (primary) hypertension: Secondary | ICD-10-CM | POA: Diagnosis not present

## 2020-10-21 DIAGNOSIS — I351 Nonrheumatic aortic (valve) insufficiency: Secondary | ICD-10-CM | POA: Diagnosis not present

## 2020-10-21 DIAGNOSIS — E785 Hyperlipidemia, unspecified: Secondary | ICD-10-CM | POA: Diagnosis not present

## 2020-10-24 DIAGNOSIS — I10 Essential (primary) hypertension: Secondary | ICD-10-CM | POA: Diagnosis not present

## 2020-10-24 DIAGNOSIS — I2510 Atherosclerotic heart disease of native coronary artery without angina pectoris: Secondary | ICD-10-CM | POA: Diagnosis not present

## 2020-10-24 DIAGNOSIS — I351 Nonrheumatic aortic (valve) insufficiency: Secondary | ICD-10-CM | POA: Diagnosis not present

## 2020-10-24 DIAGNOSIS — E785 Hyperlipidemia, unspecified: Secondary | ICD-10-CM | POA: Diagnosis not present

## 2020-10-24 DIAGNOSIS — I739 Peripheral vascular disease, unspecified: Secondary | ICD-10-CM | POA: Diagnosis not present

## 2020-10-24 DIAGNOSIS — I350 Nonrheumatic aortic (valve) stenosis: Secondary | ICD-10-CM | POA: Diagnosis not present

## 2020-10-26 DIAGNOSIS — I739 Peripheral vascular disease, unspecified: Secondary | ICD-10-CM | POA: Diagnosis not present

## 2020-10-26 DIAGNOSIS — I351 Nonrheumatic aortic (valve) insufficiency: Secondary | ICD-10-CM | POA: Diagnosis not present

## 2020-10-26 DIAGNOSIS — I10 Essential (primary) hypertension: Secondary | ICD-10-CM | POA: Diagnosis not present

## 2020-10-26 DIAGNOSIS — E785 Hyperlipidemia, unspecified: Secondary | ICD-10-CM | POA: Diagnosis not present

## 2020-10-26 DIAGNOSIS — I2510 Atherosclerotic heart disease of native coronary artery without angina pectoris: Secondary | ICD-10-CM | POA: Diagnosis not present

## 2020-10-26 DIAGNOSIS — I350 Nonrheumatic aortic (valve) stenosis: Secondary | ICD-10-CM | POA: Diagnosis not present

## 2020-10-27 DIAGNOSIS — E785 Hyperlipidemia, unspecified: Secondary | ICD-10-CM | POA: Diagnosis not present

## 2020-10-27 DIAGNOSIS — I2510 Atherosclerotic heart disease of native coronary artery without angina pectoris: Secondary | ICD-10-CM | POA: Diagnosis not present

## 2020-10-27 DIAGNOSIS — I351 Nonrheumatic aortic (valve) insufficiency: Secondary | ICD-10-CM | POA: Diagnosis not present

## 2020-10-27 DIAGNOSIS — I10 Essential (primary) hypertension: Secondary | ICD-10-CM | POA: Diagnosis not present

## 2020-10-27 DIAGNOSIS — I350 Nonrheumatic aortic (valve) stenosis: Secondary | ICD-10-CM | POA: Diagnosis not present

## 2020-10-27 DIAGNOSIS — I739 Peripheral vascular disease, unspecified: Secondary | ICD-10-CM | POA: Diagnosis not present

## 2020-10-28 DIAGNOSIS — I739 Peripheral vascular disease, unspecified: Secondary | ICD-10-CM | POA: Diagnosis not present

## 2020-10-28 DIAGNOSIS — I10 Essential (primary) hypertension: Secondary | ICD-10-CM | POA: Diagnosis not present

## 2020-10-28 DIAGNOSIS — I2510 Atherosclerotic heart disease of native coronary artery without angina pectoris: Secondary | ICD-10-CM | POA: Diagnosis not present

## 2020-10-28 DIAGNOSIS — I350 Nonrheumatic aortic (valve) stenosis: Secondary | ICD-10-CM | POA: Diagnosis not present

## 2020-10-28 DIAGNOSIS — I351 Nonrheumatic aortic (valve) insufficiency: Secondary | ICD-10-CM | POA: Diagnosis not present

## 2020-10-28 DIAGNOSIS — E785 Hyperlipidemia, unspecified: Secondary | ICD-10-CM | POA: Diagnosis not present

## 2020-10-31 DIAGNOSIS — E785 Hyperlipidemia, unspecified: Secondary | ICD-10-CM | POA: Diagnosis not present

## 2020-10-31 DIAGNOSIS — I10 Essential (primary) hypertension: Secondary | ICD-10-CM | POA: Diagnosis not present

## 2020-10-31 DIAGNOSIS — I2510 Atherosclerotic heart disease of native coronary artery without angina pectoris: Secondary | ICD-10-CM | POA: Diagnosis not present

## 2020-10-31 DIAGNOSIS — I350 Nonrheumatic aortic (valve) stenosis: Secondary | ICD-10-CM | POA: Diagnosis not present

## 2020-10-31 DIAGNOSIS — I739 Peripheral vascular disease, unspecified: Secondary | ICD-10-CM | POA: Diagnosis not present

## 2020-10-31 DIAGNOSIS — I351 Nonrheumatic aortic (valve) insufficiency: Secondary | ICD-10-CM | POA: Diagnosis not present

## 2020-11-02 ENCOUNTER — Other Ambulatory Visit: Payer: Self-pay | Admitting: Cardiovascular Disease

## 2020-11-02 DIAGNOSIS — I350 Nonrheumatic aortic (valve) stenosis: Secondary | ICD-10-CM | POA: Diagnosis not present

## 2020-11-02 DIAGNOSIS — I739 Peripheral vascular disease, unspecified: Secondary | ICD-10-CM | POA: Diagnosis not present

## 2020-11-02 DIAGNOSIS — E785 Hyperlipidemia, unspecified: Secondary | ICD-10-CM | POA: Diagnosis not present

## 2020-11-02 DIAGNOSIS — I2510 Atherosclerotic heart disease of native coronary artery without angina pectoris: Secondary | ICD-10-CM | POA: Diagnosis not present

## 2020-11-02 DIAGNOSIS — I10 Essential (primary) hypertension: Secondary | ICD-10-CM | POA: Diagnosis not present

## 2020-11-02 DIAGNOSIS — I351 Nonrheumatic aortic (valve) insufficiency: Secondary | ICD-10-CM | POA: Diagnosis not present

## 2020-11-03 DIAGNOSIS — I350 Nonrheumatic aortic (valve) stenosis: Secondary | ICD-10-CM | POA: Diagnosis not present

## 2020-11-03 DIAGNOSIS — I351 Nonrheumatic aortic (valve) insufficiency: Secondary | ICD-10-CM | POA: Diagnosis not present

## 2020-11-03 DIAGNOSIS — I10 Essential (primary) hypertension: Secondary | ICD-10-CM | POA: Diagnosis not present

## 2020-11-03 DIAGNOSIS — I739 Peripheral vascular disease, unspecified: Secondary | ICD-10-CM | POA: Diagnosis not present

## 2020-11-03 DIAGNOSIS — I2510 Atherosclerotic heart disease of native coronary artery without angina pectoris: Secondary | ICD-10-CM | POA: Diagnosis not present

## 2020-11-03 DIAGNOSIS — E785 Hyperlipidemia, unspecified: Secondary | ICD-10-CM | POA: Diagnosis not present

## 2020-11-04 DIAGNOSIS — E785 Hyperlipidemia, unspecified: Secondary | ICD-10-CM | POA: Diagnosis not present

## 2020-11-04 DIAGNOSIS — I10 Essential (primary) hypertension: Secondary | ICD-10-CM | POA: Diagnosis not present

## 2020-11-04 DIAGNOSIS — I2510 Atherosclerotic heart disease of native coronary artery without angina pectoris: Secondary | ICD-10-CM | POA: Diagnosis not present

## 2020-11-04 DIAGNOSIS — I351 Nonrheumatic aortic (valve) insufficiency: Secondary | ICD-10-CM | POA: Diagnosis not present

## 2020-11-04 DIAGNOSIS — I739 Peripheral vascular disease, unspecified: Secondary | ICD-10-CM | POA: Diagnosis not present

## 2020-11-04 DIAGNOSIS — I350 Nonrheumatic aortic (valve) stenosis: Secondary | ICD-10-CM | POA: Diagnosis not present

## 2020-11-07 DIAGNOSIS — I351 Nonrheumatic aortic (valve) insufficiency: Secondary | ICD-10-CM | POA: Diagnosis not present

## 2020-11-07 DIAGNOSIS — I350 Nonrheumatic aortic (valve) stenosis: Secondary | ICD-10-CM | POA: Diagnosis not present

## 2020-11-07 DIAGNOSIS — I2510 Atherosclerotic heart disease of native coronary artery without angina pectoris: Secondary | ICD-10-CM | POA: Diagnosis not present

## 2020-11-07 DIAGNOSIS — E785 Hyperlipidemia, unspecified: Secondary | ICD-10-CM | POA: Diagnosis not present

## 2020-11-07 DIAGNOSIS — I739 Peripheral vascular disease, unspecified: Secondary | ICD-10-CM | POA: Diagnosis not present

## 2020-11-07 DIAGNOSIS — I10 Essential (primary) hypertension: Secondary | ICD-10-CM | POA: Diagnosis not present

## 2020-11-08 DIAGNOSIS — I2510 Atherosclerotic heart disease of native coronary artery without angina pectoris: Secondary | ICD-10-CM | POA: Diagnosis not present

## 2020-11-08 DIAGNOSIS — I739 Peripheral vascular disease, unspecified: Secondary | ICD-10-CM | POA: Diagnosis not present

## 2020-11-08 DIAGNOSIS — I10 Essential (primary) hypertension: Secondary | ICD-10-CM | POA: Diagnosis not present

## 2020-11-08 DIAGNOSIS — I351 Nonrheumatic aortic (valve) insufficiency: Secondary | ICD-10-CM | POA: Diagnosis not present

## 2020-11-08 DIAGNOSIS — E785 Hyperlipidemia, unspecified: Secondary | ICD-10-CM | POA: Diagnosis not present

## 2020-11-08 DIAGNOSIS — I350 Nonrheumatic aortic (valve) stenosis: Secondary | ICD-10-CM | POA: Diagnosis not present

## 2020-11-09 DIAGNOSIS — I350 Nonrheumatic aortic (valve) stenosis: Secondary | ICD-10-CM | POA: Diagnosis not present

## 2020-11-09 DIAGNOSIS — I351 Nonrheumatic aortic (valve) insufficiency: Secondary | ICD-10-CM | POA: Diagnosis not present

## 2020-11-09 DIAGNOSIS — I739 Peripheral vascular disease, unspecified: Secondary | ICD-10-CM | POA: Diagnosis not present

## 2020-11-09 DIAGNOSIS — I2510 Atherosclerotic heart disease of native coronary artery without angina pectoris: Secondary | ICD-10-CM | POA: Diagnosis not present

## 2020-11-09 DIAGNOSIS — I10 Essential (primary) hypertension: Secondary | ICD-10-CM | POA: Diagnosis not present

## 2020-11-09 DIAGNOSIS — E785 Hyperlipidemia, unspecified: Secondary | ICD-10-CM | POA: Diagnosis not present

## 2020-11-10 DIAGNOSIS — I739 Peripheral vascular disease, unspecified: Secondary | ICD-10-CM | POA: Diagnosis not present

## 2020-11-10 DIAGNOSIS — E785 Hyperlipidemia, unspecified: Secondary | ICD-10-CM | POA: Diagnosis not present

## 2020-11-10 DIAGNOSIS — I351 Nonrheumatic aortic (valve) insufficiency: Secondary | ICD-10-CM | POA: Diagnosis not present

## 2020-11-10 DIAGNOSIS — I2510 Atherosclerotic heart disease of native coronary artery without angina pectoris: Secondary | ICD-10-CM | POA: Diagnosis not present

## 2020-11-10 DIAGNOSIS — I10 Essential (primary) hypertension: Secondary | ICD-10-CM | POA: Diagnosis not present

## 2020-11-10 DIAGNOSIS — I350 Nonrheumatic aortic (valve) stenosis: Secondary | ICD-10-CM | POA: Diagnosis not present

## 2020-11-11 DIAGNOSIS — I739 Peripheral vascular disease, unspecified: Secondary | ICD-10-CM | POA: Diagnosis not present

## 2020-11-11 DIAGNOSIS — I10 Essential (primary) hypertension: Secondary | ICD-10-CM | POA: Diagnosis not present

## 2020-11-11 DIAGNOSIS — I350 Nonrheumatic aortic (valve) stenosis: Secondary | ICD-10-CM | POA: Diagnosis not present

## 2020-11-11 DIAGNOSIS — I351 Nonrheumatic aortic (valve) insufficiency: Secondary | ICD-10-CM | POA: Diagnosis not present

## 2020-11-11 DIAGNOSIS — E785 Hyperlipidemia, unspecified: Secondary | ICD-10-CM | POA: Diagnosis not present

## 2020-11-11 DIAGNOSIS — I2510 Atherosclerotic heart disease of native coronary artery without angina pectoris: Secondary | ICD-10-CM | POA: Diagnosis not present

## 2020-11-14 DIAGNOSIS — F419 Anxiety disorder, unspecified: Secondary | ICD-10-CM | POA: Diagnosis not present

## 2020-11-14 DIAGNOSIS — I351 Nonrheumatic aortic (valve) insufficiency: Secondary | ICD-10-CM | POA: Diagnosis not present

## 2020-11-14 DIAGNOSIS — I10 Essential (primary) hypertension: Secondary | ICD-10-CM | POA: Diagnosis not present

## 2020-11-14 DIAGNOSIS — H409 Unspecified glaucoma: Secondary | ICD-10-CM | POA: Diagnosis not present

## 2020-11-14 DIAGNOSIS — F0390 Unspecified dementia without behavioral disturbance: Secondary | ICD-10-CM | POA: Diagnosis not present

## 2020-11-14 DIAGNOSIS — E785 Hyperlipidemia, unspecified: Secondary | ICD-10-CM | POA: Diagnosis not present

## 2020-11-14 DIAGNOSIS — D649 Anemia, unspecified: Secondary | ICD-10-CM | POA: Diagnosis not present

## 2020-11-14 DIAGNOSIS — I2510 Atherosclerotic heart disease of native coronary artery without angina pectoris: Secondary | ICD-10-CM | POA: Diagnosis not present

## 2020-11-14 DIAGNOSIS — I739 Peripheral vascular disease, unspecified: Secondary | ICD-10-CM | POA: Diagnosis not present

## 2020-11-14 DIAGNOSIS — K219 Gastro-esophageal reflux disease without esophagitis: Secondary | ICD-10-CM | POA: Diagnosis not present

## 2020-11-14 DIAGNOSIS — I350 Nonrheumatic aortic (valve) stenosis: Secondary | ICD-10-CM | POA: Diagnosis not present

## 2020-11-14 DIAGNOSIS — M1990 Unspecified osteoarthritis, unspecified site: Secondary | ICD-10-CM | POA: Diagnosis not present

## 2020-11-16 DIAGNOSIS — I739 Peripheral vascular disease, unspecified: Secondary | ICD-10-CM | POA: Diagnosis not present

## 2020-11-16 DIAGNOSIS — I351 Nonrheumatic aortic (valve) insufficiency: Secondary | ICD-10-CM | POA: Diagnosis not present

## 2020-11-16 DIAGNOSIS — I2510 Atherosclerotic heart disease of native coronary artery without angina pectoris: Secondary | ICD-10-CM | POA: Diagnosis not present

## 2020-11-16 DIAGNOSIS — E785 Hyperlipidemia, unspecified: Secondary | ICD-10-CM | POA: Diagnosis not present

## 2020-11-16 DIAGNOSIS — I350 Nonrheumatic aortic (valve) stenosis: Secondary | ICD-10-CM | POA: Diagnosis not present

## 2020-11-16 DIAGNOSIS — I10 Essential (primary) hypertension: Secondary | ICD-10-CM | POA: Diagnosis not present

## 2020-11-17 DIAGNOSIS — I351 Nonrheumatic aortic (valve) insufficiency: Secondary | ICD-10-CM | POA: Diagnosis not present

## 2020-11-17 DIAGNOSIS — I2510 Atherosclerotic heart disease of native coronary artery without angina pectoris: Secondary | ICD-10-CM | POA: Diagnosis not present

## 2020-11-17 DIAGNOSIS — E785 Hyperlipidemia, unspecified: Secondary | ICD-10-CM | POA: Diagnosis not present

## 2020-11-17 DIAGNOSIS — I350 Nonrheumatic aortic (valve) stenosis: Secondary | ICD-10-CM | POA: Diagnosis not present

## 2020-11-17 DIAGNOSIS — I739 Peripheral vascular disease, unspecified: Secondary | ICD-10-CM | POA: Diagnosis not present

## 2020-11-17 DIAGNOSIS — I10 Essential (primary) hypertension: Secondary | ICD-10-CM | POA: Diagnosis not present

## 2020-11-18 DIAGNOSIS — I351 Nonrheumatic aortic (valve) insufficiency: Secondary | ICD-10-CM | POA: Diagnosis not present

## 2020-11-18 DIAGNOSIS — E785 Hyperlipidemia, unspecified: Secondary | ICD-10-CM | POA: Diagnosis not present

## 2020-11-18 DIAGNOSIS — I2510 Atherosclerotic heart disease of native coronary artery without angina pectoris: Secondary | ICD-10-CM | POA: Diagnosis not present

## 2020-11-18 DIAGNOSIS — I739 Peripheral vascular disease, unspecified: Secondary | ICD-10-CM | POA: Diagnosis not present

## 2020-11-18 DIAGNOSIS — I10 Essential (primary) hypertension: Secondary | ICD-10-CM | POA: Diagnosis not present

## 2020-11-18 DIAGNOSIS — I350 Nonrheumatic aortic (valve) stenosis: Secondary | ICD-10-CM | POA: Diagnosis not present

## 2020-11-21 DIAGNOSIS — I10 Essential (primary) hypertension: Secondary | ICD-10-CM | POA: Diagnosis not present

## 2020-11-21 DIAGNOSIS — I739 Peripheral vascular disease, unspecified: Secondary | ICD-10-CM | POA: Diagnosis not present

## 2020-11-21 DIAGNOSIS — I2510 Atherosclerotic heart disease of native coronary artery without angina pectoris: Secondary | ICD-10-CM | POA: Diagnosis not present

## 2020-11-21 DIAGNOSIS — I350 Nonrheumatic aortic (valve) stenosis: Secondary | ICD-10-CM | POA: Diagnosis not present

## 2020-11-21 DIAGNOSIS — I351 Nonrheumatic aortic (valve) insufficiency: Secondary | ICD-10-CM | POA: Diagnosis not present

## 2020-11-21 DIAGNOSIS — E785 Hyperlipidemia, unspecified: Secondary | ICD-10-CM | POA: Diagnosis not present

## 2020-11-23 DIAGNOSIS — I351 Nonrheumatic aortic (valve) insufficiency: Secondary | ICD-10-CM | POA: Diagnosis not present

## 2020-11-23 DIAGNOSIS — I350 Nonrheumatic aortic (valve) stenosis: Secondary | ICD-10-CM | POA: Diagnosis not present

## 2020-11-23 DIAGNOSIS — E785 Hyperlipidemia, unspecified: Secondary | ICD-10-CM | POA: Diagnosis not present

## 2020-11-23 DIAGNOSIS — I2510 Atherosclerotic heart disease of native coronary artery without angina pectoris: Secondary | ICD-10-CM | POA: Diagnosis not present

## 2020-11-23 DIAGNOSIS — I739 Peripheral vascular disease, unspecified: Secondary | ICD-10-CM | POA: Diagnosis not present

## 2020-11-23 DIAGNOSIS — I10 Essential (primary) hypertension: Secondary | ICD-10-CM | POA: Diagnosis not present

## 2020-11-24 DIAGNOSIS — E785 Hyperlipidemia, unspecified: Secondary | ICD-10-CM | POA: Diagnosis not present

## 2020-11-24 DIAGNOSIS — I351 Nonrheumatic aortic (valve) insufficiency: Secondary | ICD-10-CM | POA: Diagnosis not present

## 2020-11-24 DIAGNOSIS — I350 Nonrheumatic aortic (valve) stenosis: Secondary | ICD-10-CM | POA: Diagnosis not present

## 2020-11-24 DIAGNOSIS — I10 Essential (primary) hypertension: Secondary | ICD-10-CM | POA: Diagnosis not present

## 2020-11-24 DIAGNOSIS — I739 Peripheral vascular disease, unspecified: Secondary | ICD-10-CM | POA: Diagnosis not present

## 2020-11-24 DIAGNOSIS — I2510 Atherosclerotic heart disease of native coronary artery without angina pectoris: Secondary | ICD-10-CM | POA: Diagnosis not present

## 2020-11-25 DIAGNOSIS — I350 Nonrheumatic aortic (valve) stenosis: Secondary | ICD-10-CM | POA: Diagnosis not present

## 2020-11-25 DIAGNOSIS — I351 Nonrheumatic aortic (valve) insufficiency: Secondary | ICD-10-CM | POA: Diagnosis not present

## 2020-11-25 DIAGNOSIS — E785 Hyperlipidemia, unspecified: Secondary | ICD-10-CM | POA: Diagnosis not present

## 2020-11-25 DIAGNOSIS — I2510 Atherosclerotic heart disease of native coronary artery without angina pectoris: Secondary | ICD-10-CM | POA: Diagnosis not present

## 2020-11-25 DIAGNOSIS — I10 Essential (primary) hypertension: Secondary | ICD-10-CM | POA: Diagnosis not present

## 2020-11-25 DIAGNOSIS — I739 Peripheral vascular disease, unspecified: Secondary | ICD-10-CM | POA: Diagnosis not present

## 2020-11-28 DIAGNOSIS — I10 Essential (primary) hypertension: Secondary | ICD-10-CM | POA: Diagnosis not present

## 2020-11-28 DIAGNOSIS — E785 Hyperlipidemia, unspecified: Secondary | ICD-10-CM | POA: Diagnosis not present

## 2020-11-28 DIAGNOSIS — I351 Nonrheumatic aortic (valve) insufficiency: Secondary | ICD-10-CM | POA: Diagnosis not present

## 2020-11-28 DIAGNOSIS — I2510 Atherosclerotic heart disease of native coronary artery without angina pectoris: Secondary | ICD-10-CM | POA: Diagnosis not present

## 2020-11-28 DIAGNOSIS — I350 Nonrheumatic aortic (valve) stenosis: Secondary | ICD-10-CM | POA: Diagnosis not present

## 2020-11-28 DIAGNOSIS — I739 Peripheral vascular disease, unspecified: Secondary | ICD-10-CM | POA: Diagnosis not present

## 2020-11-30 DIAGNOSIS — I351 Nonrheumatic aortic (valve) insufficiency: Secondary | ICD-10-CM | POA: Diagnosis not present

## 2020-11-30 DIAGNOSIS — I739 Peripheral vascular disease, unspecified: Secondary | ICD-10-CM | POA: Diagnosis not present

## 2020-11-30 DIAGNOSIS — I2510 Atherosclerotic heart disease of native coronary artery without angina pectoris: Secondary | ICD-10-CM | POA: Diagnosis not present

## 2020-11-30 DIAGNOSIS — I350 Nonrheumatic aortic (valve) stenosis: Secondary | ICD-10-CM | POA: Diagnosis not present

## 2020-11-30 DIAGNOSIS — I10 Essential (primary) hypertension: Secondary | ICD-10-CM | POA: Diagnosis not present

## 2020-11-30 DIAGNOSIS — E785 Hyperlipidemia, unspecified: Secondary | ICD-10-CM | POA: Diagnosis not present

## 2020-12-01 DIAGNOSIS — E785 Hyperlipidemia, unspecified: Secondary | ICD-10-CM | POA: Diagnosis not present

## 2020-12-01 DIAGNOSIS — I350 Nonrheumatic aortic (valve) stenosis: Secondary | ICD-10-CM | POA: Diagnosis not present

## 2020-12-01 DIAGNOSIS — I739 Peripheral vascular disease, unspecified: Secondary | ICD-10-CM | POA: Diagnosis not present

## 2020-12-01 DIAGNOSIS — I10 Essential (primary) hypertension: Secondary | ICD-10-CM | POA: Diagnosis not present

## 2020-12-01 DIAGNOSIS — I2510 Atherosclerotic heart disease of native coronary artery without angina pectoris: Secondary | ICD-10-CM | POA: Diagnosis not present

## 2020-12-01 DIAGNOSIS — I351 Nonrheumatic aortic (valve) insufficiency: Secondary | ICD-10-CM | POA: Diagnosis not present

## 2020-12-02 ENCOUNTER — Emergency Department (HOSPITAL_COMMUNITY)

## 2020-12-02 ENCOUNTER — Other Ambulatory Visit: Payer: Self-pay

## 2020-12-02 ENCOUNTER — Emergency Department (HOSPITAL_COMMUNITY)
Admission: EM | Admit: 2020-12-02 | Discharge: 2020-12-02 | Disposition: A | Discharge status: Home / Self Care | Attending: Emergency Medicine | Admitting: Emergency Medicine

## 2020-12-02 ENCOUNTER — Telehealth: Payer: Self-pay | Admitting: *Deleted

## 2020-12-02 ENCOUNTER — Encounter (HOSPITAL_COMMUNITY): Payer: Self-pay | Admitting: Emergency Medicine

## 2020-12-02 DIAGNOSIS — F0390 Unspecified dementia without behavioral disturbance: Secondary | ICD-10-CM | POA: Insufficient documentation

## 2020-12-02 DIAGNOSIS — W01198A Fall on same level from slipping, tripping and stumbling with subsequent striking against other object, initial encounter: Secondary | ICD-10-CM | POA: Insufficient documentation

## 2020-12-02 DIAGNOSIS — I739 Peripheral vascular disease, unspecified: Secondary | ICD-10-CM | POA: Diagnosis not present

## 2020-12-02 DIAGNOSIS — E785 Hyperlipidemia, unspecified: Secondary | ICD-10-CM | POA: Diagnosis not present

## 2020-12-02 DIAGNOSIS — I2510 Atherosclerotic heart disease of native coronary artery without angina pectoris: Secondary | ICD-10-CM | POA: Diagnosis not present

## 2020-12-02 DIAGNOSIS — I351 Nonrheumatic aortic (valve) insufficiency: Secondary | ICD-10-CM | POA: Diagnosis not present

## 2020-12-02 DIAGNOSIS — S0990XA Unspecified injury of head, initial encounter: Secondary | ICD-10-CM | POA: Insufficient documentation

## 2020-12-02 DIAGNOSIS — Z5321 Procedure and treatment not carried out due to patient leaving prior to being seen by health care provider: Secondary | ICD-10-CM | POA: Diagnosis not present

## 2020-12-02 DIAGNOSIS — I350 Nonrheumatic aortic (valve) stenosis: Secondary | ICD-10-CM | POA: Diagnosis not present

## 2020-12-02 DIAGNOSIS — I10 Essential (primary) hypertension: Secondary | ICD-10-CM | POA: Diagnosis not present

## 2020-12-02 NOTE — ED Notes (Addendum)
Pt stepping outside with daughter. Pt's daughter expressed that they may leave due to the wait time. Pt's family did not want pt to be removed from the [facility], as they may decide to stay.

## 2020-12-02 NOTE — ED Triage Notes (Signed)
Patient fell this morning and hit his head on a dresser. No LOC, patient is NOT anticoagulated. History of dementia, lives with daughter.

## 2020-12-02 NOTE — Telephone Encounter (Signed)
Send to ED for evaluation.

## 2020-12-02 NOTE — ED Provider Notes (Signed)
PresentingEmergency Medicine Provider Triage Evaluation Note  [name] [name] , a 87 y.o. male  was evaluated in triage.  Pt complains of patient has history of balance issues today with daughter who states patient of balance while standing up 02/1129 and fell to the ground striking his right front of the head on a dresser.  Patient not lose consciousness has not had any nausea or vomiting.  He is not on any anticoagulants he does have a history of dementia at baseline.  Daughter states that he seems to be at his mental baseline.  Review of Systems  Positive: Head injury Negative: Fever  Physical Exam  BP (!) 138/59 (BP Location: Right Arm)   Pulse 63   Temp 98.5 F (36.9 C) (Oral)   Resp 16   SpO2 100%  Gen:   Awake, no distress   Resp:  Normal effort  MSK:   Moves extremities without difficulty  Other:  No palpable tenderness of the C, T, L-spine.  No tenderness to palpation.  There is a indentation in the right forehead.  Medical Decision Making  Medically screening exam initiated at 2:19 PM.  Appropriate orders placed.  [name] [name] was informed that the remainder of the evaluation will be completed by another provider, this initial triage assessment does not replace that evaluation, and the importance of remaining in the ED until their evaluation is complete.  Head injury.  Will obtain CT imaging of head and C-spine.   Tedd Sias, [place] 12/02/20 1424    Luna Fuse, MD 12/02/20 1445

## 2020-12-02 NOTE — Telephone Encounter (Signed)
Patient daughter notified and agreed.  

## 2020-12-02 NOTE — Telephone Encounter (Signed)
Patient daughter called and stated that patient fell about 45 minutes ago. Unwitnessed fall. Stated that patient was sitting in chair and they don't know if he went to get up and fell or fell out of his chair but he hit the end table on the corner by his chair.   Hospice nurse, [name], is there now and evaluated patient and stated that he had a 1X2cm Indention on his Right Frontal Lobe. Using Ice. Acting normal and responding fine. Patient is sleepy but has been up all night.   Daughter is wanting to know if patient need to go to the ER or go for a CT Scan.   No available appointments in office.  Please Advise.

## 2020-12-02 NOTE — ED Notes (Signed)
Called pt 2x for updated vitals. No answer receive inside lobby or outside of ED.

## 2020-12-04 DIAGNOSIS — I2510 Atherosclerotic heart disease of native coronary artery without angina pectoris: Secondary | ICD-10-CM | POA: Diagnosis not present

## 2020-12-04 DIAGNOSIS — E785 Hyperlipidemia, unspecified: Secondary | ICD-10-CM | POA: Diagnosis not present

## 2020-12-04 DIAGNOSIS — I351 Nonrheumatic aortic (valve) insufficiency: Secondary | ICD-10-CM | POA: Diagnosis not present

## 2020-12-04 DIAGNOSIS — I350 Nonrheumatic aortic (valve) stenosis: Secondary | ICD-10-CM | POA: Diagnosis not present

## 2020-12-04 DIAGNOSIS — I10 Essential (primary) hypertension: Secondary | ICD-10-CM | POA: Diagnosis not present

## 2020-12-04 DIAGNOSIS — I739 Peripheral vascular disease, unspecified: Secondary | ICD-10-CM | POA: Diagnosis not present

## 2020-12-05 DIAGNOSIS — E785 Hyperlipidemia, unspecified: Secondary | ICD-10-CM | POA: Diagnosis not present

## 2020-12-05 DIAGNOSIS — I10 Essential (primary) hypertension: Secondary | ICD-10-CM | POA: Diagnosis not present

## 2020-12-05 DIAGNOSIS — I351 Nonrheumatic aortic (valve) insufficiency: Secondary | ICD-10-CM | POA: Diagnosis not present

## 2020-12-05 DIAGNOSIS — I2510 Atherosclerotic heart disease of native coronary artery without angina pectoris: Secondary | ICD-10-CM | POA: Diagnosis not present

## 2020-12-05 DIAGNOSIS — I350 Nonrheumatic aortic (valve) stenosis: Secondary | ICD-10-CM | POA: Diagnosis not present

## 2020-12-05 DIAGNOSIS — I739 Peripheral vascular disease, unspecified: Secondary | ICD-10-CM | POA: Diagnosis not present

## 2020-12-06 DIAGNOSIS — I350 Nonrheumatic aortic (valve) stenosis: Secondary | ICD-10-CM | POA: Diagnosis not present

## 2020-12-06 DIAGNOSIS — E785 Hyperlipidemia, unspecified: Secondary | ICD-10-CM | POA: Diagnosis not present

## 2020-12-06 DIAGNOSIS — I2510 Atherosclerotic heart disease of native coronary artery without angina pectoris: Secondary | ICD-10-CM | POA: Diagnosis not present

## 2020-12-06 DIAGNOSIS — I10 Essential (primary) hypertension: Secondary | ICD-10-CM | POA: Diagnosis not present

## 2020-12-06 DIAGNOSIS — I351 Nonrheumatic aortic (valve) insufficiency: Secondary | ICD-10-CM | POA: Diagnosis not present

## 2020-12-06 DIAGNOSIS — I739 Peripheral vascular disease, unspecified: Secondary | ICD-10-CM | POA: Diagnosis not present

## 2020-12-07 DIAGNOSIS — I2510 Atherosclerotic heart disease of native coronary artery without angina pectoris: Secondary | ICD-10-CM | POA: Diagnosis not present

## 2020-12-07 DIAGNOSIS — I739 Peripheral vascular disease, unspecified: Secondary | ICD-10-CM | POA: Diagnosis not present

## 2020-12-07 DIAGNOSIS — I351 Nonrheumatic aortic (valve) insufficiency: Secondary | ICD-10-CM | POA: Diagnosis not present

## 2020-12-07 DIAGNOSIS — I350 Nonrheumatic aortic (valve) stenosis: Secondary | ICD-10-CM | POA: Diagnosis not present

## 2020-12-07 DIAGNOSIS — E785 Hyperlipidemia, unspecified: Secondary | ICD-10-CM | POA: Diagnosis not present

## 2020-12-07 DIAGNOSIS — I10 Essential (primary) hypertension: Secondary | ICD-10-CM | POA: Diagnosis not present

## 2020-12-09 DIAGNOSIS — I351 Nonrheumatic aortic (valve) insufficiency: Secondary | ICD-10-CM | POA: Diagnosis not present

## 2020-12-09 DIAGNOSIS — I739 Peripheral vascular disease, unspecified: Secondary | ICD-10-CM | POA: Diagnosis not present

## 2020-12-09 DIAGNOSIS — I10 Essential (primary) hypertension: Secondary | ICD-10-CM | POA: Diagnosis not present

## 2020-12-09 DIAGNOSIS — E785 Hyperlipidemia, unspecified: Secondary | ICD-10-CM | POA: Diagnosis not present

## 2020-12-09 DIAGNOSIS — I350 Nonrheumatic aortic (valve) stenosis: Secondary | ICD-10-CM | POA: Diagnosis not present

## 2020-12-09 DIAGNOSIS — I2510 Atherosclerotic heart disease of native coronary artery without angina pectoris: Secondary | ICD-10-CM | POA: Diagnosis not present

## 2020-12-12 ENCOUNTER — Encounter: Payer: Self-pay | Admitting: Podiatry

## 2020-12-12 ENCOUNTER — Other Ambulatory Visit: Payer: Self-pay

## 2020-12-12 ENCOUNTER — Ambulatory Visit (INDEPENDENT_AMBULATORY_CARE_PROVIDER_SITE_OTHER): Payer: 59 | Admitting: Podiatry

## 2020-12-12 DIAGNOSIS — B351 Tinea unguium: Secondary | ICD-10-CM | POA: Diagnosis not present

## 2020-12-12 DIAGNOSIS — M79676 Pain in unspecified toe(s): Secondary | ICD-10-CM

## 2020-12-12 DIAGNOSIS — N181 Chronic kidney disease, stage 1: Secondary | ICD-10-CM

## 2020-12-12 DIAGNOSIS — R0989 Other specified symptoms and signs involving the circulatory and respiratory systems: Secondary | ICD-10-CM | POA: Insufficient documentation

## 2020-12-12 DIAGNOSIS — E1122 Type 2 diabetes mellitus with diabetic chronic kidney disease: Secondary | ICD-10-CM

## 2020-12-14 DIAGNOSIS — I739 Peripheral vascular disease, unspecified: Secondary | ICD-10-CM | POA: Diagnosis not present

## 2020-12-14 DIAGNOSIS — I350 Nonrheumatic aortic (valve) stenosis: Secondary | ICD-10-CM | POA: Diagnosis not present

## 2020-12-14 DIAGNOSIS — I2510 Atherosclerotic heart disease of native coronary artery without angina pectoris: Secondary | ICD-10-CM | POA: Diagnosis not present

## 2020-12-14 DIAGNOSIS — I10 Essential (primary) hypertension: Secondary | ICD-10-CM | POA: Diagnosis not present

## 2020-12-14 DIAGNOSIS — E785 Hyperlipidemia, unspecified: Secondary | ICD-10-CM | POA: Diagnosis not present

## 2020-12-14 DIAGNOSIS — I351 Nonrheumatic aortic (valve) insufficiency: Secondary | ICD-10-CM | POA: Diagnosis not present

## 2020-12-14 NOTE — Progress Notes (Signed)
  Subjective:  Patient ID: [name] [name], male    DOB: February 21, 1933,  MRN: [id number]  87 y.o. male presents painful thick toenails that are difficult to trim. Pain interferes with ambulation. Aggravating factors include wearing enclosed shoe gear. Pain is relieved with periodic professional debridement.  He is diabetic. Patient does not remember what his blood glucose was  on today. He is on hospice.  PCP is Ngetich, Dinah C, NP , and last visit was 10/06/2020.  Allergies  Allergen Reactions   Atorvastatin Other (See Comments)    Muscle aches Muscle aches Muscle aches   Benazepril Hcl Cough   Cosopt [Dorzolamide Hcl-Timolol Mal] Other (See Comments)    Eyes water   Ezetimibe-Simvastatin Other (See Comments)    REACTION: aches REACTION: aches REACTION: aches   Aricept [Donepezil Hcl]     hallucinations   Norvasc [Amlodipine Besylate]     swelling   Zoloft [Sertraline]     Review of Systems: Negative except as noted in the HPI.   Objective:  Vascular Examination: Vascular status intact b/l with palpable pedal pulses. CFT immediate b/l. No edema. No pain with calf compression b/l. Skin temperature gradient WNL b/l.   Neurological Examination: Sensation grossly intact b/l with 10 gram monofilament. Vibratory sensation intact b/l.   Dermatological Examination: Pedal skin with normal turgor, texture and tone b/l. Toenails 1-5 right, L hallux, L 3rd toe, L 4th toe, and L 5th toe elongated, discolored, dystrophic, thickened, and crumbly with subungual debris and tenderness to dorsal palpation. Anonychia noted L 2nd toe. Nailbed(s) epithelialized.   Musculoskeletal Examination: Muscle strength 5/5 to b/l LE.  Radiographs: None Assessment:   1. Pain due to onychomycosis of toenail   2. Controlled type 2 diabetes mellitus with stage 1 chronic kidney disease, without long-term current use of insulin ([place])     Plan:  -No new findings. No new orders. -Patient to continue soft,  supportive shoe gear daily. -Toenails 1-5 right, L hallux, L 3rd toe, L 4th toe, and L 5th toe debrided in length and girth without iatrogenic bleeding with sterile nail nipper and dremel.  -Patient to report any pedal injuries to medical professional immediately. -Patient/POA to call should there be question/concern in the interim.  Return in about 3 months (around 03/14/2021).  Marzetta Board, DPM

## 2020-12-15 DIAGNOSIS — I2510 Atherosclerotic heart disease of native coronary artery without angina pectoris: Secondary | ICD-10-CM | POA: Diagnosis not present

## 2020-12-15 DIAGNOSIS — I351 Nonrheumatic aortic (valve) insufficiency: Secondary | ICD-10-CM | POA: Diagnosis not present

## 2020-12-15 DIAGNOSIS — D649 Anemia, unspecified: Secondary | ICD-10-CM | POA: Diagnosis not present

## 2020-12-15 DIAGNOSIS — F419 Anxiety disorder, unspecified: Secondary | ICD-10-CM | POA: Diagnosis not present

## 2020-12-15 DIAGNOSIS — F0390 Unspecified dementia without behavioral disturbance: Secondary | ICD-10-CM | POA: Diagnosis not present

## 2020-12-15 DIAGNOSIS — E785 Hyperlipidemia, unspecified: Secondary | ICD-10-CM | POA: Diagnosis not present

## 2020-12-15 DIAGNOSIS — I350 Nonrheumatic aortic (valve) stenosis: Secondary | ICD-10-CM | POA: Diagnosis not present

## 2020-12-15 DIAGNOSIS — I739 Peripheral vascular disease, unspecified: Secondary | ICD-10-CM | POA: Diagnosis not present

## 2020-12-15 DIAGNOSIS — H409 Unspecified glaucoma: Secondary | ICD-10-CM | POA: Diagnosis not present

## 2020-12-15 DIAGNOSIS — M1990 Unspecified osteoarthritis, unspecified site: Secondary | ICD-10-CM | POA: Diagnosis not present

## 2020-12-15 DIAGNOSIS — I10 Essential (primary) hypertension: Secondary | ICD-10-CM | POA: Diagnosis not present

## 2020-12-15 DIAGNOSIS — K219 Gastro-esophageal reflux disease without esophagitis: Secondary | ICD-10-CM | POA: Diagnosis not present

## 2020-12-16 DIAGNOSIS — I739 Peripheral vascular disease, unspecified: Secondary | ICD-10-CM | POA: Diagnosis not present

## 2020-12-16 DIAGNOSIS — E785 Hyperlipidemia, unspecified: Secondary | ICD-10-CM | POA: Diagnosis not present

## 2020-12-16 DIAGNOSIS — I351 Nonrheumatic aortic (valve) insufficiency: Secondary | ICD-10-CM | POA: Diagnosis not present

## 2020-12-16 DIAGNOSIS — I10 Essential (primary) hypertension: Secondary | ICD-10-CM | POA: Diagnosis not present

## 2020-12-16 DIAGNOSIS — I350 Nonrheumatic aortic (valve) stenosis: Secondary | ICD-10-CM | POA: Diagnosis not present

## 2020-12-16 DIAGNOSIS — I2510 Atherosclerotic heart disease of native coronary artery without angina pectoris: Secondary | ICD-10-CM | POA: Diagnosis not present

## 2020-12-19 DIAGNOSIS — I739 Peripheral vascular disease, unspecified: Secondary | ICD-10-CM | POA: Diagnosis not present

## 2020-12-19 DIAGNOSIS — I2510 Atherosclerotic heart disease of native coronary artery without angina pectoris: Secondary | ICD-10-CM | POA: Diagnosis not present

## 2020-12-19 DIAGNOSIS — I350 Nonrheumatic aortic (valve) stenosis: Secondary | ICD-10-CM | POA: Diagnosis not present

## 2020-12-19 DIAGNOSIS — E785 Hyperlipidemia, unspecified: Secondary | ICD-10-CM | POA: Diagnosis not present

## 2020-12-19 DIAGNOSIS — I10 Essential (primary) hypertension: Secondary | ICD-10-CM | POA: Diagnosis not present

## 2020-12-19 DIAGNOSIS — I351 Nonrheumatic aortic (valve) insufficiency: Secondary | ICD-10-CM | POA: Diagnosis not present

## 2020-12-21 DIAGNOSIS — I351 Nonrheumatic aortic (valve) insufficiency: Secondary | ICD-10-CM | POA: Diagnosis not present

## 2020-12-21 DIAGNOSIS — I10 Essential (primary) hypertension: Secondary | ICD-10-CM | POA: Diagnosis not present

## 2020-12-21 DIAGNOSIS — E785 Hyperlipidemia, unspecified: Secondary | ICD-10-CM | POA: Diagnosis not present

## 2020-12-21 DIAGNOSIS — I2510 Atherosclerotic heart disease of native coronary artery without angina pectoris: Secondary | ICD-10-CM | POA: Diagnosis not present

## 2020-12-21 DIAGNOSIS — I350 Nonrheumatic aortic (valve) stenosis: Secondary | ICD-10-CM | POA: Diagnosis not present

## 2020-12-21 DIAGNOSIS — I739 Peripheral vascular disease, unspecified: Secondary | ICD-10-CM | POA: Diagnosis not present

## 2020-12-22 DIAGNOSIS — E785 Hyperlipidemia, unspecified: Secondary | ICD-10-CM | POA: Diagnosis not present

## 2020-12-22 DIAGNOSIS — I2510 Atherosclerotic heart disease of native coronary artery without angina pectoris: Secondary | ICD-10-CM | POA: Diagnosis not present

## 2020-12-22 DIAGNOSIS — I351 Nonrheumatic aortic (valve) insufficiency: Secondary | ICD-10-CM | POA: Diagnosis not present

## 2020-12-22 DIAGNOSIS — I350 Nonrheumatic aortic (valve) stenosis: Secondary | ICD-10-CM | POA: Diagnosis not present

## 2020-12-22 DIAGNOSIS — I739 Peripheral vascular disease, unspecified: Secondary | ICD-10-CM | POA: Diagnosis not present

## 2020-12-22 DIAGNOSIS — I10 Essential (primary) hypertension: Secondary | ICD-10-CM | POA: Diagnosis not present

## 2020-12-23 DIAGNOSIS — E785 Hyperlipidemia, unspecified: Secondary | ICD-10-CM | POA: Diagnosis not present

## 2020-12-23 DIAGNOSIS — I10 Essential (primary) hypertension: Secondary | ICD-10-CM | POA: Diagnosis not present

## 2020-12-23 DIAGNOSIS — I739 Peripheral vascular disease, unspecified: Secondary | ICD-10-CM | POA: Diagnosis not present

## 2020-12-23 DIAGNOSIS — I351 Nonrheumatic aortic (valve) insufficiency: Secondary | ICD-10-CM | POA: Diagnosis not present

## 2020-12-23 DIAGNOSIS — I2510 Atherosclerotic heart disease of native coronary artery without angina pectoris: Secondary | ICD-10-CM | POA: Diagnosis not present

## 2020-12-23 DIAGNOSIS — I350 Nonrheumatic aortic (valve) stenosis: Secondary | ICD-10-CM | POA: Diagnosis not present

## 2020-12-26 DIAGNOSIS — I351 Nonrheumatic aortic (valve) insufficiency: Secondary | ICD-10-CM | POA: Diagnosis not present

## 2020-12-26 DIAGNOSIS — I10 Essential (primary) hypertension: Secondary | ICD-10-CM | POA: Diagnosis not present

## 2020-12-26 DIAGNOSIS — E785 Hyperlipidemia, unspecified: Secondary | ICD-10-CM | POA: Diagnosis not present

## 2020-12-26 DIAGNOSIS — I739 Peripheral vascular disease, unspecified: Secondary | ICD-10-CM | POA: Diagnosis not present

## 2020-12-26 DIAGNOSIS — I350 Nonrheumatic aortic (valve) stenosis: Secondary | ICD-10-CM | POA: Diagnosis not present

## 2020-12-26 DIAGNOSIS — I2510 Atherosclerotic heart disease of native coronary artery without angina pectoris: Secondary | ICD-10-CM | POA: Diagnosis not present

## 2020-12-28 DIAGNOSIS — E785 Hyperlipidemia, unspecified: Secondary | ICD-10-CM | POA: Diagnosis not present

## 2020-12-28 DIAGNOSIS — I351 Nonrheumatic aortic (valve) insufficiency: Secondary | ICD-10-CM | POA: Diagnosis not present

## 2020-12-28 DIAGNOSIS — I2510 Atherosclerotic heart disease of native coronary artery without angina pectoris: Secondary | ICD-10-CM | POA: Diagnosis not present

## 2020-12-28 DIAGNOSIS — I350 Nonrheumatic aortic (valve) stenosis: Secondary | ICD-10-CM | POA: Diagnosis not present

## 2020-12-28 DIAGNOSIS — I10 Essential (primary) hypertension: Secondary | ICD-10-CM | POA: Diagnosis not present

## 2020-12-28 DIAGNOSIS — I739 Peripheral vascular disease, unspecified: Secondary | ICD-10-CM | POA: Diagnosis not present

## 2020-12-29 DIAGNOSIS — I2510 Atherosclerotic heart disease of native coronary artery without angina pectoris: Secondary | ICD-10-CM | POA: Diagnosis not present

## 2020-12-29 DIAGNOSIS — I10 Essential (primary) hypertension: Secondary | ICD-10-CM | POA: Diagnosis not present

## 2020-12-29 DIAGNOSIS — I351 Nonrheumatic aortic (valve) insufficiency: Secondary | ICD-10-CM | POA: Diagnosis not present

## 2020-12-29 DIAGNOSIS — I739 Peripheral vascular disease, unspecified: Secondary | ICD-10-CM | POA: Diagnosis not present

## 2020-12-29 DIAGNOSIS — E785 Hyperlipidemia, unspecified: Secondary | ICD-10-CM | POA: Diagnosis not present

## 2020-12-29 DIAGNOSIS — I350 Nonrheumatic aortic (valve) stenosis: Secondary | ICD-10-CM | POA: Diagnosis not present

## 2020-12-30 DIAGNOSIS — I739 Peripheral vascular disease, unspecified: Secondary | ICD-10-CM | POA: Diagnosis not present

## 2020-12-30 DIAGNOSIS — I2510 Atherosclerotic heart disease of native coronary artery without angina pectoris: Secondary | ICD-10-CM | POA: Diagnosis not present

## 2020-12-30 DIAGNOSIS — I351 Nonrheumatic aortic (valve) insufficiency: Secondary | ICD-10-CM | POA: Diagnosis not present

## 2020-12-30 DIAGNOSIS — I350 Nonrheumatic aortic (valve) stenosis: Secondary | ICD-10-CM | POA: Diagnosis not present

## 2020-12-30 DIAGNOSIS — E785 Hyperlipidemia, unspecified: Secondary | ICD-10-CM | POA: Diagnosis not present

## 2020-12-30 DIAGNOSIS — I10 Essential (primary) hypertension: Secondary | ICD-10-CM | POA: Diagnosis not present

## 2021-01-01 DIAGNOSIS — I10 Essential (primary) hypertension: Secondary | ICD-10-CM | POA: Diagnosis not present

## 2021-01-01 DIAGNOSIS — I350 Nonrheumatic aortic (valve) stenosis: Secondary | ICD-10-CM | POA: Diagnosis not present

## 2021-01-01 DIAGNOSIS — E785 Hyperlipidemia, unspecified: Secondary | ICD-10-CM | POA: Diagnosis not present

## 2021-01-01 DIAGNOSIS — I351 Nonrheumatic aortic (valve) insufficiency: Secondary | ICD-10-CM | POA: Diagnosis not present

## 2021-01-01 DIAGNOSIS — I2510 Atherosclerotic heart disease of native coronary artery without angina pectoris: Secondary | ICD-10-CM | POA: Diagnosis not present

## 2021-01-01 DIAGNOSIS — I739 Peripheral vascular disease, unspecified: Secondary | ICD-10-CM | POA: Diagnosis not present

## 2021-01-02 DIAGNOSIS — I10 Essential (primary) hypertension: Secondary | ICD-10-CM | POA: Diagnosis not present

## 2021-01-02 DIAGNOSIS — I2510 Atherosclerotic heart disease of native coronary artery without angina pectoris: Secondary | ICD-10-CM | POA: Diagnosis not present

## 2021-01-02 DIAGNOSIS — I350 Nonrheumatic aortic (valve) stenosis: Secondary | ICD-10-CM | POA: Diagnosis not present

## 2021-01-02 DIAGNOSIS — I351 Nonrheumatic aortic (valve) insufficiency: Secondary | ICD-10-CM | POA: Diagnosis not present

## 2021-01-02 DIAGNOSIS — E785 Hyperlipidemia, unspecified: Secondary | ICD-10-CM | POA: Diagnosis not present

## 2021-01-02 DIAGNOSIS — I739 Peripheral vascular disease, unspecified: Secondary | ICD-10-CM | POA: Diagnosis not present

## 2021-01-04 DIAGNOSIS — I350 Nonrheumatic aortic (valve) stenosis: Secondary | ICD-10-CM | POA: Diagnosis not present

## 2021-01-04 DIAGNOSIS — I351 Nonrheumatic aortic (valve) insufficiency: Secondary | ICD-10-CM | POA: Diagnosis not present

## 2021-01-04 DIAGNOSIS — I2510 Atherosclerotic heart disease of native coronary artery without angina pectoris: Secondary | ICD-10-CM | POA: Diagnosis not present

## 2021-01-04 DIAGNOSIS — E785 Hyperlipidemia, unspecified: Secondary | ICD-10-CM | POA: Diagnosis not present

## 2021-01-04 DIAGNOSIS — I739 Peripheral vascular disease, unspecified: Secondary | ICD-10-CM | POA: Diagnosis not present

## 2021-01-04 DIAGNOSIS — I10 Essential (primary) hypertension: Secondary | ICD-10-CM | POA: Diagnosis not present

## 2021-01-05 DIAGNOSIS — I10 Essential (primary) hypertension: Secondary | ICD-10-CM | POA: Diagnosis not present

## 2021-01-05 DIAGNOSIS — E785 Hyperlipidemia, unspecified: Secondary | ICD-10-CM | POA: Diagnosis not present

## 2021-01-05 DIAGNOSIS — I350 Nonrheumatic aortic (valve) stenosis: Secondary | ICD-10-CM | POA: Diagnosis not present

## 2021-01-05 DIAGNOSIS — I739 Peripheral vascular disease, unspecified: Secondary | ICD-10-CM | POA: Diagnosis not present

## 2021-01-05 DIAGNOSIS — I2510 Atherosclerotic heart disease of native coronary artery without angina pectoris: Secondary | ICD-10-CM | POA: Diagnosis not present

## 2021-01-05 DIAGNOSIS — I351 Nonrheumatic aortic (valve) insufficiency: Secondary | ICD-10-CM | POA: Diagnosis not present

## 2021-01-06 DIAGNOSIS — I739 Peripheral vascular disease, unspecified: Secondary | ICD-10-CM | POA: Diagnosis not present

## 2021-01-06 DIAGNOSIS — E785 Hyperlipidemia, unspecified: Secondary | ICD-10-CM | POA: Diagnosis not present

## 2021-01-06 DIAGNOSIS — I2510 Atherosclerotic heart disease of native coronary artery without angina pectoris: Secondary | ICD-10-CM | POA: Diagnosis not present

## 2021-01-06 DIAGNOSIS — I350 Nonrheumatic aortic (valve) stenosis: Secondary | ICD-10-CM | POA: Diagnosis not present

## 2021-01-06 DIAGNOSIS — I351 Nonrheumatic aortic (valve) insufficiency: Secondary | ICD-10-CM | POA: Diagnosis not present

## 2021-01-06 DIAGNOSIS — I10 Essential (primary) hypertension: Secondary | ICD-10-CM | POA: Diagnosis not present

## 2021-01-09 DIAGNOSIS — I739 Peripheral vascular disease, unspecified: Secondary | ICD-10-CM | POA: Diagnosis not present

## 2021-01-09 DIAGNOSIS — I10 Essential (primary) hypertension: Secondary | ICD-10-CM | POA: Diagnosis not present

## 2021-01-09 DIAGNOSIS — I351 Nonrheumatic aortic (valve) insufficiency: Secondary | ICD-10-CM | POA: Diagnosis not present

## 2021-01-09 DIAGNOSIS — I350 Nonrheumatic aortic (valve) stenosis: Secondary | ICD-10-CM | POA: Diagnosis not present

## 2021-01-09 DIAGNOSIS — I2510 Atherosclerotic heart disease of native coronary artery without angina pectoris: Secondary | ICD-10-CM | POA: Diagnosis not present

## 2021-01-09 DIAGNOSIS — E785 Hyperlipidemia, unspecified: Secondary | ICD-10-CM | POA: Diagnosis not present

## 2021-01-11 DIAGNOSIS — I351 Nonrheumatic aortic (valve) insufficiency: Secondary | ICD-10-CM | POA: Diagnosis not present

## 2021-01-11 DIAGNOSIS — I350 Nonrheumatic aortic (valve) stenosis: Secondary | ICD-10-CM | POA: Diagnosis not present

## 2021-01-11 DIAGNOSIS — I10 Essential (primary) hypertension: Secondary | ICD-10-CM | POA: Diagnosis not present

## 2021-01-11 DIAGNOSIS — I739 Peripheral vascular disease, unspecified: Secondary | ICD-10-CM | POA: Diagnosis not present

## 2021-01-11 DIAGNOSIS — E785 Hyperlipidemia, unspecified: Secondary | ICD-10-CM | POA: Diagnosis not present

## 2021-01-11 DIAGNOSIS — I2510 Atherosclerotic heart disease of native coronary artery without angina pectoris: Secondary | ICD-10-CM | POA: Diagnosis not present

## 2021-01-12 DIAGNOSIS — I739 Peripheral vascular disease, unspecified: Secondary | ICD-10-CM | POA: Diagnosis not present

## 2021-01-12 DIAGNOSIS — I351 Nonrheumatic aortic (valve) insufficiency: Secondary | ICD-10-CM | POA: Diagnosis not present

## 2021-01-12 DIAGNOSIS — E785 Hyperlipidemia, unspecified: Secondary | ICD-10-CM | POA: Diagnosis not present

## 2021-01-12 DIAGNOSIS — I10 Essential (primary) hypertension: Secondary | ICD-10-CM | POA: Diagnosis not present

## 2021-01-12 DIAGNOSIS — I350 Nonrheumatic aortic (valve) stenosis: Secondary | ICD-10-CM | POA: Diagnosis not present

## 2021-01-12 DIAGNOSIS — I2510 Atherosclerotic heart disease of native coronary artery without angina pectoris: Secondary | ICD-10-CM | POA: Diagnosis not present

## 2021-01-13 DIAGNOSIS — I351 Nonrheumatic aortic (valve) insufficiency: Secondary | ICD-10-CM | POA: Diagnosis not present

## 2021-01-13 DIAGNOSIS — I739 Peripheral vascular disease, unspecified: Secondary | ICD-10-CM | POA: Diagnosis not present

## 2021-01-13 DIAGNOSIS — I2510 Atherosclerotic heart disease of native coronary artery without angina pectoris: Secondary | ICD-10-CM | POA: Diagnosis not present

## 2021-01-13 DIAGNOSIS — I10 Essential (primary) hypertension: Secondary | ICD-10-CM | POA: Diagnosis not present

## 2021-01-13 DIAGNOSIS — E785 Hyperlipidemia, unspecified: Secondary | ICD-10-CM | POA: Diagnosis not present

## 2021-01-13 DIAGNOSIS — I350 Nonrheumatic aortic (valve) stenosis: Secondary | ICD-10-CM | POA: Diagnosis not present

## 2021-01-14 DIAGNOSIS — M1990 Unspecified osteoarthritis, unspecified site: Secondary | ICD-10-CM | POA: Diagnosis not present

## 2021-01-14 DIAGNOSIS — I351 Nonrheumatic aortic (valve) insufficiency: Secondary | ICD-10-CM | POA: Diagnosis not present

## 2021-01-14 DIAGNOSIS — I2510 Atherosclerotic heart disease of native coronary artery without angina pectoris: Secondary | ICD-10-CM | POA: Diagnosis not present

## 2021-01-14 DIAGNOSIS — I10 Essential (primary) hypertension: Secondary | ICD-10-CM | POA: Diagnosis not present

## 2021-01-14 DIAGNOSIS — E785 Hyperlipidemia, unspecified: Secondary | ICD-10-CM | POA: Diagnosis not present

## 2021-01-14 DIAGNOSIS — F0390 Unspecified dementia without behavioral disturbance: Secondary | ICD-10-CM | POA: Diagnosis not present

## 2021-01-14 DIAGNOSIS — F419 Anxiety disorder, unspecified: Secondary | ICD-10-CM | POA: Diagnosis not present

## 2021-01-14 DIAGNOSIS — K219 Gastro-esophageal reflux disease without esophagitis: Secondary | ICD-10-CM | POA: Diagnosis not present

## 2021-01-14 DIAGNOSIS — I350 Nonrheumatic aortic (valve) stenosis: Secondary | ICD-10-CM | POA: Diagnosis not present

## 2021-01-14 DIAGNOSIS — D649 Anemia, unspecified: Secondary | ICD-10-CM | POA: Diagnosis not present

## 2021-01-14 DIAGNOSIS — I739 Peripheral vascular disease, unspecified: Secondary | ICD-10-CM | POA: Diagnosis not present

## 2021-01-14 DIAGNOSIS — H409 Unspecified glaucoma: Secondary | ICD-10-CM | POA: Diagnosis not present

## 2021-01-16 DIAGNOSIS — I739 Peripheral vascular disease, unspecified: Secondary | ICD-10-CM | POA: Diagnosis not present

## 2021-01-16 DIAGNOSIS — I351 Nonrheumatic aortic (valve) insufficiency: Secondary | ICD-10-CM | POA: Diagnosis not present

## 2021-01-16 DIAGNOSIS — I2510 Atherosclerotic heart disease of native coronary artery without angina pectoris: Secondary | ICD-10-CM | POA: Diagnosis not present

## 2021-01-16 DIAGNOSIS — I350 Nonrheumatic aortic (valve) stenosis: Secondary | ICD-10-CM | POA: Diagnosis not present

## 2021-01-16 DIAGNOSIS — I10 Essential (primary) hypertension: Secondary | ICD-10-CM | POA: Diagnosis not present

## 2021-01-16 DIAGNOSIS — E785 Hyperlipidemia, unspecified: Secondary | ICD-10-CM | POA: Diagnosis not present

## 2021-01-18 DIAGNOSIS — I350 Nonrheumatic aortic (valve) stenosis: Secondary | ICD-10-CM | POA: Diagnosis not present

## 2021-01-18 DIAGNOSIS — I2510 Atherosclerotic heart disease of native coronary artery without angina pectoris: Secondary | ICD-10-CM | POA: Diagnosis not present

## 2021-01-18 DIAGNOSIS — E785 Hyperlipidemia, unspecified: Secondary | ICD-10-CM | POA: Diagnosis not present

## 2021-01-18 DIAGNOSIS — I739 Peripheral vascular disease, unspecified: Secondary | ICD-10-CM | POA: Diagnosis not present

## 2021-01-18 DIAGNOSIS — I10 Essential (primary) hypertension: Secondary | ICD-10-CM | POA: Diagnosis not present

## 2021-01-18 DIAGNOSIS — I351 Nonrheumatic aortic (valve) insufficiency: Secondary | ICD-10-CM | POA: Diagnosis not present

## 2021-01-20 ENCOUNTER — Other Ambulatory Visit: Payer: Self-pay | Admitting: Cardiovascular Disease

## 2021-01-20 DIAGNOSIS — I351 Nonrheumatic aortic (valve) insufficiency: Secondary | ICD-10-CM | POA: Diagnosis not present

## 2021-01-20 DIAGNOSIS — E785 Hyperlipidemia, unspecified: Secondary | ICD-10-CM | POA: Diagnosis not present

## 2021-01-20 DIAGNOSIS — I739 Peripheral vascular disease, unspecified: Secondary | ICD-10-CM | POA: Diagnosis not present

## 2021-01-20 DIAGNOSIS — I10 Essential (primary) hypertension: Secondary | ICD-10-CM | POA: Diagnosis not present

## 2021-01-20 DIAGNOSIS — I350 Nonrheumatic aortic (valve) stenosis: Secondary | ICD-10-CM | POA: Diagnosis not present

## 2021-01-20 DIAGNOSIS — I2510 Atherosclerotic heart disease of native coronary artery without angina pectoris: Secondary | ICD-10-CM | POA: Diagnosis not present

## 2021-01-23 DIAGNOSIS — I351 Nonrheumatic aortic (valve) insufficiency: Secondary | ICD-10-CM | POA: Diagnosis not present

## 2021-01-23 DIAGNOSIS — E785 Hyperlipidemia, unspecified: Secondary | ICD-10-CM | POA: Diagnosis not present

## 2021-01-23 DIAGNOSIS — I2510 Atherosclerotic heart disease of native coronary artery without angina pectoris: Secondary | ICD-10-CM | POA: Diagnosis not present

## 2021-01-23 DIAGNOSIS — I10 Essential (primary) hypertension: Secondary | ICD-10-CM | POA: Diagnosis not present

## 2021-01-23 DIAGNOSIS — I350 Nonrheumatic aortic (valve) stenosis: Secondary | ICD-10-CM | POA: Diagnosis not present

## 2021-01-23 DIAGNOSIS — I739 Peripheral vascular disease, unspecified: Secondary | ICD-10-CM | POA: Diagnosis not present

## 2021-01-24 DIAGNOSIS — E785 Hyperlipidemia, unspecified: Secondary | ICD-10-CM | POA: Diagnosis not present

## 2021-01-24 DIAGNOSIS — I739 Peripheral vascular disease, unspecified: Secondary | ICD-10-CM | POA: Diagnosis not present

## 2021-01-24 DIAGNOSIS — I2510 Atherosclerotic heart disease of native coronary artery without angina pectoris: Secondary | ICD-10-CM | POA: Diagnosis not present

## 2021-01-24 DIAGNOSIS — I351 Nonrheumatic aortic (valve) insufficiency: Secondary | ICD-10-CM | POA: Diagnosis not present

## 2021-01-24 DIAGNOSIS — I10 Essential (primary) hypertension: Secondary | ICD-10-CM | POA: Diagnosis not present

## 2021-01-24 DIAGNOSIS — I350 Nonrheumatic aortic (valve) stenosis: Secondary | ICD-10-CM | POA: Diagnosis not present

## 2021-01-25 DIAGNOSIS — E785 Hyperlipidemia, unspecified: Secondary | ICD-10-CM | POA: Diagnosis not present

## 2021-01-25 DIAGNOSIS — I2510 Atherosclerotic heart disease of native coronary artery without angina pectoris: Secondary | ICD-10-CM | POA: Diagnosis not present

## 2021-01-25 DIAGNOSIS — I10 Essential (primary) hypertension: Secondary | ICD-10-CM | POA: Diagnosis not present

## 2021-01-25 DIAGNOSIS — I739 Peripheral vascular disease, unspecified: Secondary | ICD-10-CM | POA: Diagnosis not present

## 2021-01-25 DIAGNOSIS — I350 Nonrheumatic aortic (valve) stenosis: Secondary | ICD-10-CM | POA: Diagnosis not present

## 2021-01-25 DIAGNOSIS — I351 Nonrheumatic aortic (valve) insufficiency: Secondary | ICD-10-CM | POA: Diagnosis not present

## 2021-01-27 DIAGNOSIS — I739 Peripheral vascular disease, unspecified: Secondary | ICD-10-CM | POA: Diagnosis not present

## 2021-01-27 DIAGNOSIS — I2510 Atherosclerotic heart disease of native coronary artery without angina pectoris: Secondary | ICD-10-CM | POA: Diagnosis not present

## 2021-01-27 DIAGNOSIS — I351 Nonrheumatic aortic (valve) insufficiency: Secondary | ICD-10-CM | POA: Diagnosis not present

## 2021-01-27 DIAGNOSIS — I350 Nonrheumatic aortic (valve) stenosis: Secondary | ICD-10-CM | POA: Diagnosis not present

## 2021-01-27 DIAGNOSIS — I10 Essential (primary) hypertension: Secondary | ICD-10-CM | POA: Diagnosis not present

## 2021-01-27 DIAGNOSIS — E785 Hyperlipidemia, unspecified: Secondary | ICD-10-CM | POA: Diagnosis not present

## 2021-01-30 DIAGNOSIS — I350 Nonrheumatic aortic (valve) stenosis: Secondary | ICD-10-CM | POA: Diagnosis not present

## 2021-01-30 DIAGNOSIS — I10 Essential (primary) hypertension: Secondary | ICD-10-CM | POA: Diagnosis not present

## 2021-01-30 DIAGNOSIS — I351 Nonrheumatic aortic (valve) insufficiency: Secondary | ICD-10-CM | POA: Diagnosis not present

## 2021-01-30 DIAGNOSIS — I2510 Atherosclerotic heart disease of native coronary artery without angina pectoris: Secondary | ICD-10-CM | POA: Diagnosis not present

## 2021-01-30 DIAGNOSIS — E785 Hyperlipidemia, unspecified: Secondary | ICD-10-CM | POA: Diagnosis not present

## 2021-01-30 DIAGNOSIS — I739 Peripheral vascular disease, unspecified: Secondary | ICD-10-CM | POA: Diagnosis not present

## 2021-02-01 DIAGNOSIS — I351 Nonrheumatic aortic (valve) insufficiency: Secondary | ICD-10-CM | POA: Diagnosis not present

## 2021-02-01 DIAGNOSIS — I350 Nonrheumatic aortic (valve) stenosis: Secondary | ICD-10-CM | POA: Diagnosis not present

## 2021-02-01 DIAGNOSIS — E785 Hyperlipidemia, unspecified: Secondary | ICD-10-CM | POA: Diagnosis not present

## 2021-02-01 DIAGNOSIS — I10 Essential (primary) hypertension: Secondary | ICD-10-CM | POA: Diagnosis not present

## 2021-02-01 DIAGNOSIS — I2510 Atherosclerotic heart disease of native coronary artery without angina pectoris: Secondary | ICD-10-CM | POA: Diagnosis not present

## 2021-02-01 DIAGNOSIS — I739 Peripheral vascular disease, unspecified: Secondary | ICD-10-CM | POA: Diagnosis not present

## 2021-02-02 DIAGNOSIS — I351 Nonrheumatic aortic (valve) insufficiency: Secondary | ICD-10-CM | POA: Diagnosis not present

## 2021-02-02 DIAGNOSIS — I350 Nonrheumatic aortic (valve) stenosis: Secondary | ICD-10-CM | POA: Diagnosis not present

## 2021-02-02 DIAGNOSIS — I739 Peripheral vascular disease, unspecified: Secondary | ICD-10-CM | POA: Diagnosis not present

## 2021-02-02 DIAGNOSIS — I2510 Atherosclerotic heart disease of native coronary artery without angina pectoris: Secondary | ICD-10-CM | POA: Diagnosis not present

## 2021-02-02 DIAGNOSIS — I10 Essential (primary) hypertension: Secondary | ICD-10-CM | POA: Diagnosis not present

## 2021-02-02 DIAGNOSIS — E785 Hyperlipidemia, unspecified: Secondary | ICD-10-CM | POA: Diagnosis not present

## 2021-02-03 DIAGNOSIS — I2510 Atherosclerotic heart disease of native coronary artery without angina pectoris: Secondary | ICD-10-CM | POA: Diagnosis not present

## 2021-02-03 DIAGNOSIS — I350 Nonrheumatic aortic (valve) stenosis: Secondary | ICD-10-CM | POA: Diagnosis not present

## 2021-02-03 DIAGNOSIS — I739 Peripheral vascular disease, unspecified: Secondary | ICD-10-CM | POA: Diagnosis not present

## 2021-02-03 DIAGNOSIS — E785 Hyperlipidemia, unspecified: Secondary | ICD-10-CM | POA: Diagnosis not present

## 2021-02-03 DIAGNOSIS — I10 Essential (primary) hypertension: Secondary | ICD-10-CM | POA: Diagnosis not present

## 2021-02-03 DIAGNOSIS — I351 Nonrheumatic aortic (valve) insufficiency: Secondary | ICD-10-CM | POA: Diagnosis not present

## 2021-02-06 DIAGNOSIS — I2510 Atherosclerotic heart disease of native coronary artery without angina pectoris: Secondary | ICD-10-CM | POA: Diagnosis not present

## 2021-02-06 DIAGNOSIS — I351 Nonrheumatic aortic (valve) insufficiency: Secondary | ICD-10-CM | POA: Diagnosis not present

## 2021-02-06 DIAGNOSIS — I350 Nonrheumatic aortic (valve) stenosis: Secondary | ICD-10-CM | POA: Diagnosis not present

## 2021-02-06 DIAGNOSIS — I739 Peripheral vascular disease, unspecified: Secondary | ICD-10-CM | POA: Diagnosis not present

## 2021-02-06 DIAGNOSIS — E785 Hyperlipidemia, unspecified: Secondary | ICD-10-CM | POA: Diagnosis not present

## 2021-02-06 DIAGNOSIS — I10 Essential (primary) hypertension: Secondary | ICD-10-CM | POA: Diagnosis not present

## 2021-02-08 DIAGNOSIS — E785 Hyperlipidemia, unspecified: Secondary | ICD-10-CM | POA: Diagnosis not present

## 2021-02-08 DIAGNOSIS — I350 Nonrheumatic aortic (valve) stenosis: Secondary | ICD-10-CM | POA: Diagnosis not present

## 2021-02-08 DIAGNOSIS — I2510 Atherosclerotic heart disease of native coronary artery without angina pectoris: Secondary | ICD-10-CM | POA: Diagnosis not present

## 2021-02-08 DIAGNOSIS — I739 Peripheral vascular disease, unspecified: Secondary | ICD-10-CM | POA: Diagnosis not present

## 2021-02-08 DIAGNOSIS — I351 Nonrheumatic aortic (valve) insufficiency: Secondary | ICD-10-CM | POA: Diagnosis not present

## 2021-02-08 DIAGNOSIS — I10 Essential (primary) hypertension: Secondary | ICD-10-CM | POA: Diagnosis not present

## 2021-02-09 DIAGNOSIS — I350 Nonrheumatic aortic (valve) stenosis: Secondary | ICD-10-CM | POA: Diagnosis not present

## 2021-02-09 DIAGNOSIS — I739 Peripheral vascular disease, unspecified: Secondary | ICD-10-CM | POA: Diagnosis not present

## 2021-02-09 DIAGNOSIS — I351 Nonrheumatic aortic (valve) insufficiency: Secondary | ICD-10-CM | POA: Diagnosis not present

## 2021-02-09 DIAGNOSIS — I10 Essential (primary) hypertension: Secondary | ICD-10-CM | POA: Diagnosis not present

## 2021-02-09 DIAGNOSIS — I2510 Atherosclerotic heart disease of native coronary artery without angina pectoris: Secondary | ICD-10-CM | POA: Diagnosis not present

## 2021-02-09 DIAGNOSIS — E785 Hyperlipidemia, unspecified: Secondary | ICD-10-CM | POA: Diagnosis not present

## 2021-02-10 DIAGNOSIS — I739 Peripheral vascular disease, unspecified: Secondary | ICD-10-CM | POA: Diagnosis not present

## 2021-02-10 DIAGNOSIS — I350 Nonrheumatic aortic (valve) stenosis: Secondary | ICD-10-CM | POA: Diagnosis not present

## 2021-02-10 DIAGNOSIS — I10 Essential (primary) hypertension: Secondary | ICD-10-CM | POA: Diagnosis not present

## 2021-02-10 DIAGNOSIS — E785 Hyperlipidemia, unspecified: Secondary | ICD-10-CM | POA: Diagnosis not present

## 2021-02-10 DIAGNOSIS — I2510 Atherosclerotic heart disease of native coronary artery without angina pectoris: Secondary | ICD-10-CM | POA: Diagnosis not present

## 2021-02-10 DIAGNOSIS — I351 Nonrheumatic aortic (valve) insufficiency: Secondary | ICD-10-CM | POA: Diagnosis not present

## 2021-02-13 DIAGNOSIS — I739 Peripheral vascular disease, unspecified: Secondary | ICD-10-CM | POA: Diagnosis not present

## 2021-02-13 DIAGNOSIS — I2510 Atherosclerotic heart disease of native coronary artery without angina pectoris: Secondary | ICD-10-CM | POA: Diagnosis not present

## 2021-02-13 DIAGNOSIS — I351 Nonrheumatic aortic (valve) insufficiency: Secondary | ICD-10-CM | POA: Diagnosis not present

## 2021-02-13 DIAGNOSIS — E785 Hyperlipidemia, unspecified: Secondary | ICD-10-CM | POA: Diagnosis not present

## 2021-02-13 DIAGNOSIS — I10 Essential (primary) hypertension: Secondary | ICD-10-CM | POA: Diagnosis not present

## 2021-02-13 DIAGNOSIS — I350 Nonrheumatic aortic (valve) stenosis: Secondary | ICD-10-CM | POA: Diagnosis not present

## 2021-02-14 DIAGNOSIS — I350 Nonrheumatic aortic (valve) stenosis: Secondary | ICD-10-CM | POA: Diagnosis not present

## 2021-02-14 DIAGNOSIS — I739 Peripheral vascular disease, unspecified: Secondary | ICD-10-CM | POA: Diagnosis not present

## 2021-02-14 DIAGNOSIS — F0390 Unspecified dementia without behavioral disturbance: Secondary | ICD-10-CM | POA: Diagnosis not present

## 2021-02-14 DIAGNOSIS — K219 Gastro-esophageal reflux disease without esophagitis: Secondary | ICD-10-CM | POA: Diagnosis not present

## 2021-02-14 DIAGNOSIS — I2510 Atherosclerotic heart disease of native coronary artery without angina pectoris: Secondary | ICD-10-CM | POA: Diagnosis not present

## 2021-02-14 DIAGNOSIS — I351 Nonrheumatic aortic (valve) insufficiency: Secondary | ICD-10-CM | POA: Diagnosis not present

## 2021-02-14 DIAGNOSIS — H409 Unspecified glaucoma: Secondary | ICD-10-CM | POA: Diagnosis not present

## 2021-02-14 DIAGNOSIS — I10 Essential (primary) hypertension: Secondary | ICD-10-CM | POA: Diagnosis not present

## 2021-02-14 DIAGNOSIS — D649 Anemia, unspecified: Secondary | ICD-10-CM | POA: Diagnosis not present

## 2021-02-14 DIAGNOSIS — E785 Hyperlipidemia, unspecified: Secondary | ICD-10-CM | POA: Diagnosis not present

## 2021-02-14 DIAGNOSIS — M1990 Unspecified osteoarthritis, unspecified site: Secondary | ICD-10-CM | POA: Diagnosis not present

## 2021-02-14 DIAGNOSIS — F419 Anxiety disorder, unspecified: Secondary | ICD-10-CM | POA: Diagnosis not present

## 2021-02-15 DIAGNOSIS — I10 Essential (primary) hypertension: Secondary | ICD-10-CM | POA: Diagnosis not present

## 2021-02-15 DIAGNOSIS — I351 Nonrheumatic aortic (valve) insufficiency: Secondary | ICD-10-CM | POA: Diagnosis not present

## 2021-02-15 DIAGNOSIS — I350 Nonrheumatic aortic (valve) stenosis: Secondary | ICD-10-CM | POA: Diagnosis not present

## 2021-02-15 DIAGNOSIS — I739 Peripheral vascular disease, unspecified: Secondary | ICD-10-CM | POA: Diagnosis not present

## 2021-02-15 DIAGNOSIS — E785 Hyperlipidemia, unspecified: Secondary | ICD-10-CM | POA: Diagnosis not present

## 2021-02-15 DIAGNOSIS — I2510 Atherosclerotic heart disease of native coronary artery without angina pectoris: Secondary | ICD-10-CM | POA: Diagnosis not present

## 2021-02-16 DIAGNOSIS — E785 Hyperlipidemia, unspecified: Secondary | ICD-10-CM | POA: Diagnosis not present

## 2021-02-16 DIAGNOSIS — I10 Essential (primary) hypertension: Secondary | ICD-10-CM | POA: Diagnosis not present

## 2021-02-16 DIAGNOSIS — I350 Nonrheumatic aortic (valve) stenosis: Secondary | ICD-10-CM | POA: Diagnosis not present

## 2021-02-16 DIAGNOSIS — I2510 Atherosclerotic heart disease of native coronary artery without angina pectoris: Secondary | ICD-10-CM | POA: Diagnosis not present

## 2021-02-16 DIAGNOSIS — I351 Nonrheumatic aortic (valve) insufficiency: Secondary | ICD-10-CM | POA: Diagnosis not present

## 2021-02-16 DIAGNOSIS — I739 Peripheral vascular disease, unspecified: Secondary | ICD-10-CM | POA: Diagnosis not present

## 2021-02-17 DIAGNOSIS — I350 Nonrheumatic aortic (valve) stenosis: Secondary | ICD-10-CM | POA: Diagnosis not present

## 2021-02-17 DIAGNOSIS — E785 Hyperlipidemia, unspecified: Secondary | ICD-10-CM | POA: Diagnosis not present

## 2021-02-17 DIAGNOSIS — I739 Peripheral vascular disease, unspecified: Secondary | ICD-10-CM | POA: Diagnosis not present

## 2021-02-17 DIAGNOSIS — I10 Essential (primary) hypertension: Secondary | ICD-10-CM | POA: Diagnosis not present

## 2021-02-17 DIAGNOSIS — I351 Nonrheumatic aortic (valve) insufficiency: Secondary | ICD-10-CM | POA: Diagnosis not present

## 2021-02-17 DIAGNOSIS — I2510 Atherosclerotic heart disease of native coronary artery without angina pectoris: Secondary | ICD-10-CM | POA: Diagnosis not present

## 2021-02-20 DIAGNOSIS — I739 Peripheral vascular disease, unspecified: Secondary | ICD-10-CM | POA: Diagnosis not present

## 2021-02-20 DIAGNOSIS — I350 Nonrheumatic aortic (valve) stenosis: Secondary | ICD-10-CM | POA: Diagnosis not present

## 2021-02-20 DIAGNOSIS — E785 Hyperlipidemia, unspecified: Secondary | ICD-10-CM | POA: Diagnosis not present

## 2021-02-20 DIAGNOSIS — I351 Nonrheumatic aortic (valve) insufficiency: Secondary | ICD-10-CM | POA: Diagnosis not present

## 2021-02-20 DIAGNOSIS — I2510 Atherosclerotic heart disease of native coronary artery without angina pectoris: Secondary | ICD-10-CM | POA: Diagnosis not present

## 2021-02-20 DIAGNOSIS — I10 Essential (primary) hypertension: Secondary | ICD-10-CM | POA: Diagnosis not present

## 2021-02-22 DIAGNOSIS — I350 Nonrheumatic aortic (valve) stenosis: Secondary | ICD-10-CM | POA: Diagnosis not present

## 2021-02-22 DIAGNOSIS — E785 Hyperlipidemia, unspecified: Secondary | ICD-10-CM | POA: Diagnosis not present

## 2021-02-22 DIAGNOSIS — I10 Essential (primary) hypertension: Secondary | ICD-10-CM | POA: Diagnosis not present

## 2021-02-22 DIAGNOSIS — I2510 Atherosclerotic heart disease of native coronary artery without angina pectoris: Secondary | ICD-10-CM | POA: Diagnosis not present

## 2021-02-22 DIAGNOSIS — I351 Nonrheumatic aortic (valve) insufficiency: Secondary | ICD-10-CM | POA: Diagnosis not present

## 2021-02-22 DIAGNOSIS — I739 Peripheral vascular disease, unspecified: Secondary | ICD-10-CM | POA: Diagnosis not present

## 2021-02-23 DIAGNOSIS — I739 Peripheral vascular disease, unspecified: Secondary | ICD-10-CM | POA: Diagnosis not present

## 2021-02-23 DIAGNOSIS — I351 Nonrheumatic aortic (valve) insufficiency: Secondary | ICD-10-CM | POA: Diagnosis not present

## 2021-02-23 DIAGNOSIS — E785 Hyperlipidemia, unspecified: Secondary | ICD-10-CM | POA: Diagnosis not present

## 2021-02-23 DIAGNOSIS — I350 Nonrheumatic aortic (valve) stenosis: Secondary | ICD-10-CM | POA: Diagnosis not present

## 2021-02-23 DIAGNOSIS — I2510 Atherosclerotic heart disease of native coronary artery without angina pectoris: Secondary | ICD-10-CM | POA: Diagnosis not present

## 2021-02-23 DIAGNOSIS — I10 Essential (primary) hypertension: Secondary | ICD-10-CM | POA: Diagnosis not present

## 2021-02-24 DIAGNOSIS — I351 Nonrheumatic aortic (valve) insufficiency: Secondary | ICD-10-CM | POA: Diagnosis not present

## 2021-02-24 DIAGNOSIS — I2510 Atherosclerotic heart disease of native coronary artery without angina pectoris: Secondary | ICD-10-CM | POA: Diagnosis not present

## 2021-02-24 DIAGNOSIS — E785 Hyperlipidemia, unspecified: Secondary | ICD-10-CM | POA: Diagnosis not present

## 2021-02-24 DIAGNOSIS — I10 Essential (primary) hypertension: Secondary | ICD-10-CM | POA: Diagnosis not present

## 2021-02-24 DIAGNOSIS — I739 Peripheral vascular disease, unspecified: Secondary | ICD-10-CM | POA: Diagnosis not present

## 2021-02-24 DIAGNOSIS — I350 Nonrheumatic aortic (valve) stenosis: Secondary | ICD-10-CM | POA: Diagnosis not present

## 2021-02-27 DIAGNOSIS — I350 Nonrheumatic aortic (valve) stenosis: Secondary | ICD-10-CM | POA: Diagnosis not present

## 2021-02-27 DIAGNOSIS — I2510 Atherosclerotic heart disease of native coronary artery without angina pectoris: Secondary | ICD-10-CM | POA: Diagnosis not present

## 2021-02-27 DIAGNOSIS — I351 Nonrheumatic aortic (valve) insufficiency: Secondary | ICD-10-CM | POA: Diagnosis not present

## 2021-02-27 DIAGNOSIS — I10 Essential (primary) hypertension: Secondary | ICD-10-CM | POA: Diagnosis not present

## 2021-02-27 DIAGNOSIS — I739 Peripheral vascular disease, unspecified: Secondary | ICD-10-CM | POA: Diagnosis not present

## 2021-02-27 DIAGNOSIS — E785 Hyperlipidemia, unspecified: Secondary | ICD-10-CM | POA: Diagnosis not present

## 2021-03-01 DIAGNOSIS — I10 Essential (primary) hypertension: Secondary | ICD-10-CM | POA: Diagnosis not present

## 2021-03-01 DIAGNOSIS — I739 Peripheral vascular disease, unspecified: Secondary | ICD-10-CM | POA: Diagnosis not present

## 2021-03-01 DIAGNOSIS — I2510 Atherosclerotic heart disease of native coronary artery without angina pectoris: Secondary | ICD-10-CM | POA: Diagnosis not present

## 2021-03-01 DIAGNOSIS — E785 Hyperlipidemia, unspecified: Secondary | ICD-10-CM | POA: Diagnosis not present

## 2021-03-01 DIAGNOSIS — I350 Nonrheumatic aortic (valve) stenosis: Secondary | ICD-10-CM | POA: Diagnosis not present

## 2021-03-01 DIAGNOSIS — I351 Nonrheumatic aortic (valve) insufficiency: Secondary | ICD-10-CM | POA: Diagnosis not present

## 2021-03-02 DIAGNOSIS — E785 Hyperlipidemia, unspecified: Secondary | ICD-10-CM | POA: Diagnosis not present

## 2021-03-02 DIAGNOSIS — I10 Essential (primary) hypertension: Secondary | ICD-10-CM | POA: Diagnosis not present

## 2021-03-02 DIAGNOSIS — I739 Peripheral vascular disease, unspecified: Secondary | ICD-10-CM | POA: Diagnosis not present

## 2021-03-02 DIAGNOSIS — I2510 Atherosclerotic heart disease of native coronary artery without angina pectoris: Secondary | ICD-10-CM | POA: Diagnosis not present

## 2021-03-02 DIAGNOSIS — I350 Nonrheumatic aortic (valve) stenosis: Secondary | ICD-10-CM | POA: Diagnosis not present

## 2021-03-02 DIAGNOSIS — I351 Nonrheumatic aortic (valve) insufficiency: Secondary | ICD-10-CM | POA: Diagnosis not present

## 2021-03-03 DIAGNOSIS — E785 Hyperlipidemia, unspecified: Secondary | ICD-10-CM | POA: Diagnosis not present

## 2021-03-03 DIAGNOSIS — I739 Peripheral vascular disease, unspecified: Secondary | ICD-10-CM | POA: Diagnosis not present

## 2021-03-03 DIAGNOSIS — I350 Nonrheumatic aortic (valve) stenosis: Secondary | ICD-10-CM | POA: Diagnosis not present

## 2021-03-03 DIAGNOSIS — I10 Essential (primary) hypertension: Secondary | ICD-10-CM | POA: Diagnosis not present

## 2021-03-03 DIAGNOSIS — I351 Nonrheumatic aortic (valve) insufficiency: Secondary | ICD-10-CM | POA: Diagnosis not present

## 2021-03-03 DIAGNOSIS — I2510 Atherosclerotic heart disease of native coronary artery without angina pectoris: Secondary | ICD-10-CM | POA: Diagnosis not present

## 2021-03-06 DIAGNOSIS — E785 Hyperlipidemia, unspecified: Secondary | ICD-10-CM | POA: Diagnosis not present

## 2021-03-06 DIAGNOSIS — I10 Essential (primary) hypertension: Secondary | ICD-10-CM | POA: Diagnosis not present

## 2021-03-06 DIAGNOSIS — I739 Peripheral vascular disease, unspecified: Secondary | ICD-10-CM | POA: Diagnosis not present

## 2021-03-06 DIAGNOSIS — I351 Nonrheumatic aortic (valve) insufficiency: Secondary | ICD-10-CM | POA: Diagnosis not present

## 2021-03-06 DIAGNOSIS — I350 Nonrheumatic aortic (valve) stenosis: Secondary | ICD-10-CM | POA: Diagnosis not present

## 2021-03-06 DIAGNOSIS — I2510 Atherosclerotic heart disease of native coronary artery without angina pectoris: Secondary | ICD-10-CM | POA: Diagnosis not present

## 2021-03-08 DIAGNOSIS — I351 Nonrheumatic aortic (valve) insufficiency: Secondary | ICD-10-CM | POA: Diagnosis not present

## 2021-03-08 DIAGNOSIS — I10 Essential (primary) hypertension: Secondary | ICD-10-CM | POA: Diagnosis not present

## 2021-03-08 DIAGNOSIS — I2510 Atherosclerotic heart disease of native coronary artery without angina pectoris: Secondary | ICD-10-CM | POA: Diagnosis not present

## 2021-03-08 DIAGNOSIS — E785 Hyperlipidemia, unspecified: Secondary | ICD-10-CM | POA: Diagnosis not present

## 2021-03-08 DIAGNOSIS — I739 Peripheral vascular disease, unspecified: Secondary | ICD-10-CM | POA: Diagnosis not present

## 2021-03-08 DIAGNOSIS — I350 Nonrheumatic aortic (valve) stenosis: Secondary | ICD-10-CM | POA: Diagnosis not present

## 2021-03-10 DIAGNOSIS — I10 Essential (primary) hypertension: Secondary | ICD-10-CM | POA: Diagnosis not present

## 2021-03-10 DIAGNOSIS — I351 Nonrheumatic aortic (valve) insufficiency: Secondary | ICD-10-CM | POA: Diagnosis not present

## 2021-03-10 DIAGNOSIS — E785 Hyperlipidemia, unspecified: Secondary | ICD-10-CM | POA: Diagnosis not present

## 2021-03-10 DIAGNOSIS — I2510 Atherosclerotic heart disease of native coronary artery without angina pectoris: Secondary | ICD-10-CM | POA: Diagnosis not present

## 2021-03-10 DIAGNOSIS — I350 Nonrheumatic aortic (valve) stenosis: Secondary | ICD-10-CM | POA: Diagnosis not present

## 2021-03-10 DIAGNOSIS — I739 Peripheral vascular disease, unspecified: Secondary | ICD-10-CM | POA: Diagnosis not present

## 2021-03-13 DIAGNOSIS — I351 Nonrheumatic aortic (valve) insufficiency: Secondary | ICD-10-CM | POA: Diagnosis not present

## 2021-03-13 DIAGNOSIS — I2510 Atherosclerotic heart disease of native coronary artery without angina pectoris: Secondary | ICD-10-CM | POA: Diagnosis not present

## 2021-03-13 DIAGNOSIS — I350 Nonrheumatic aortic (valve) stenosis: Secondary | ICD-10-CM | POA: Diagnosis not present

## 2021-03-13 DIAGNOSIS — I10 Essential (primary) hypertension: Secondary | ICD-10-CM | POA: Diagnosis not present

## 2021-03-13 DIAGNOSIS — E785 Hyperlipidemia, unspecified: Secondary | ICD-10-CM | POA: Diagnosis not present

## 2021-03-13 DIAGNOSIS — I739 Peripheral vascular disease, unspecified: Secondary | ICD-10-CM | POA: Diagnosis not present

## 2021-03-15 DIAGNOSIS — I739 Peripheral vascular disease, unspecified: Secondary | ICD-10-CM | POA: Diagnosis not present

## 2021-03-15 DIAGNOSIS — I10 Essential (primary) hypertension: Secondary | ICD-10-CM | POA: Diagnosis not present

## 2021-03-15 DIAGNOSIS — I350 Nonrheumatic aortic (valve) stenosis: Secondary | ICD-10-CM | POA: Diagnosis not present

## 2021-03-15 DIAGNOSIS — I351 Nonrheumatic aortic (valve) insufficiency: Secondary | ICD-10-CM | POA: Diagnosis not present

## 2021-03-15 DIAGNOSIS — I2510 Atherosclerotic heart disease of native coronary artery without angina pectoris: Secondary | ICD-10-CM | POA: Diagnosis not present

## 2021-03-15 DIAGNOSIS — E785 Hyperlipidemia, unspecified: Secondary | ICD-10-CM | POA: Diagnosis not present

## 2021-03-16 DIAGNOSIS — I351 Nonrheumatic aortic (valve) insufficiency: Secondary | ICD-10-CM | POA: Diagnosis not present

## 2021-03-16 DIAGNOSIS — M1990 Unspecified osteoarthritis, unspecified site: Secondary | ICD-10-CM | POA: Diagnosis not present

## 2021-03-16 DIAGNOSIS — I10 Essential (primary) hypertension: Secondary | ICD-10-CM | POA: Diagnosis not present

## 2021-03-16 DIAGNOSIS — I350 Nonrheumatic aortic (valve) stenosis: Secondary | ICD-10-CM | POA: Diagnosis not present

## 2021-03-16 DIAGNOSIS — I739 Peripheral vascular disease, unspecified: Secondary | ICD-10-CM | POA: Diagnosis not present

## 2021-03-16 DIAGNOSIS — H409 Unspecified glaucoma: Secondary | ICD-10-CM | POA: Diagnosis not present

## 2021-03-16 DIAGNOSIS — E785 Hyperlipidemia, unspecified: Secondary | ICD-10-CM | POA: Diagnosis not present

## 2021-03-16 DIAGNOSIS — F0390 Unspecified dementia without behavioral disturbance: Secondary | ICD-10-CM | POA: Diagnosis not present

## 2021-03-16 DIAGNOSIS — F419 Anxiety disorder, unspecified: Secondary | ICD-10-CM | POA: Diagnosis not present

## 2021-03-16 DIAGNOSIS — D649 Anemia, unspecified: Secondary | ICD-10-CM | POA: Diagnosis not present

## 2021-03-16 DIAGNOSIS — K219 Gastro-esophageal reflux disease without esophagitis: Secondary | ICD-10-CM | POA: Diagnosis not present

## 2021-03-16 DIAGNOSIS — I2510 Atherosclerotic heart disease of native coronary artery without angina pectoris: Secondary | ICD-10-CM | POA: Diagnosis not present

## 2021-03-17 DIAGNOSIS — I351 Nonrheumatic aortic (valve) insufficiency: Secondary | ICD-10-CM | POA: Diagnosis not present

## 2021-03-17 DIAGNOSIS — I739 Peripheral vascular disease, unspecified: Secondary | ICD-10-CM | POA: Diagnosis not present

## 2021-03-17 DIAGNOSIS — I2510 Atherosclerotic heart disease of native coronary artery without angina pectoris: Secondary | ICD-10-CM | POA: Diagnosis not present

## 2021-03-17 DIAGNOSIS — I10 Essential (primary) hypertension: Secondary | ICD-10-CM | POA: Diagnosis not present

## 2021-03-17 DIAGNOSIS — I350 Nonrheumatic aortic (valve) stenosis: Secondary | ICD-10-CM | POA: Diagnosis not present

## 2021-03-17 DIAGNOSIS — E785 Hyperlipidemia, unspecified: Secondary | ICD-10-CM | POA: Diagnosis not present

## 2021-03-20 ENCOUNTER — Ambulatory Visit (INDEPENDENT_AMBULATORY_CARE_PROVIDER_SITE_OTHER): Payer: Self-pay | Admitting: Podiatry

## 2021-03-20 DIAGNOSIS — Z91199 Patient's noncompliance with other medical treatment and regimen due to unspecified reason: Secondary | ICD-10-CM

## 2021-03-20 DIAGNOSIS — I351 Nonrheumatic aortic (valve) insufficiency: Secondary | ICD-10-CM | POA: Diagnosis not present

## 2021-03-20 DIAGNOSIS — I739 Peripheral vascular disease, unspecified: Secondary | ICD-10-CM | POA: Diagnosis not present

## 2021-03-20 DIAGNOSIS — I10 Essential (primary) hypertension: Secondary | ICD-10-CM | POA: Diagnosis not present

## 2021-03-20 DIAGNOSIS — E785 Hyperlipidemia, unspecified: Secondary | ICD-10-CM | POA: Diagnosis not present

## 2021-03-20 DIAGNOSIS — I2510 Atherosclerotic heart disease of native coronary artery without angina pectoris: Secondary | ICD-10-CM | POA: Diagnosis not present

## 2021-03-20 DIAGNOSIS — I350 Nonrheumatic aortic (valve) stenosis: Secondary | ICD-10-CM | POA: Diagnosis not present

## 2021-03-20 NOTE — Progress Notes (Signed)
No show for appointment,

## 2021-03-22 DIAGNOSIS — I351 Nonrheumatic aortic (valve) insufficiency: Secondary | ICD-10-CM | POA: Diagnosis not present

## 2021-03-22 DIAGNOSIS — I2510 Atherosclerotic heart disease of native coronary artery without angina pectoris: Secondary | ICD-10-CM | POA: Diagnosis not present

## 2021-03-22 DIAGNOSIS — E785 Hyperlipidemia, unspecified: Secondary | ICD-10-CM | POA: Diagnosis not present

## 2021-03-22 DIAGNOSIS — I350 Nonrheumatic aortic (valve) stenosis: Secondary | ICD-10-CM | POA: Diagnosis not present

## 2021-03-22 DIAGNOSIS — I739 Peripheral vascular disease, unspecified: Secondary | ICD-10-CM | POA: Diagnosis not present

## 2021-03-22 DIAGNOSIS — I10 Essential (primary) hypertension: Secondary | ICD-10-CM | POA: Diagnosis not present

## 2021-03-24 DIAGNOSIS — I351 Nonrheumatic aortic (valve) insufficiency: Secondary | ICD-10-CM | POA: Diagnosis not present

## 2021-03-24 DIAGNOSIS — E785 Hyperlipidemia, unspecified: Secondary | ICD-10-CM | POA: Diagnosis not present

## 2021-03-24 DIAGNOSIS — I10 Essential (primary) hypertension: Secondary | ICD-10-CM | POA: Diagnosis not present

## 2021-03-24 DIAGNOSIS — I2510 Atherosclerotic heart disease of native coronary artery without angina pectoris: Secondary | ICD-10-CM | POA: Diagnosis not present

## 2021-03-24 DIAGNOSIS — I350 Nonrheumatic aortic (valve) stenosis: Secondary | ICD-10-CM | POA: Diagnosis not present

## 2021-03-24 DIAGNOSIS — I739 Peripheral vascular disease, unspecified: Secondary | ICD-10-CM | POA: Diagnosis not present

## 2021-03-27 DIAGNOSIS — I351 Nonrheumatic aortic (valve) insufficiency: Secondary | ICD-10-CM | POA: Diagnosis not present

## 2021-03-27 DIAGNOSIS — E785 Hyperlipidemia, unspecified: Secondary | ICD-10-CM | POA: Diagnosis not present

## 2021-03-27 DIAGNOSIS — I350 Nonrheumatic aortic (valve) stenosis: Secondary | ICD-10-CM | POA: Diagnosis not present

## 2021-03-27 DIAGNOSIS — I739 Peripheral vascular disease, unspecified: Secondary | ICD-10-CM | POA: Diagnosis not present

## 2021-03-27 DIAGNOSIS — I10 Essential (primary) hypertension: Secondary | ICD-10-CM | POA: Diagnosis not present

## 2021-03-27 DIAGNOSIS — I2510 Atherosclerotic heart disease of native coronary artery without angina pectoris: Secondary | ICD-10-CM | POA: Diagnosis not present

## 2021-03-29 DIAGNOSIS — I351 Nonrheumatic aortic (valve) insufficiency: Secondary | ICD-10-CM | POA: Diagnosis not present

## 2021-03-29 DIAGNOSIS — I2510 Atherosclerotic heart disease of native coronary artery without angina pectoris: Secondary | ICD-10-CM | POA: Diagnosis not present

## 2021-03-29 DIAGNOSIS — I739 Peripheral vascular disease, unspecified: Secondary | ICD-10-CM | POA: Diagnosis not present

## 2021-03-29 DIAGNOSIS — E785 Hyperlipidemia, unspecified: Secondary | ICD-10-CM | POA: Diagnosis not present

## 2021-03-29 DIAGNOSIS — I10 Essential (primary) hypertension: Secondary | ICD-10-CM | POA: Diagnosis not present

## 2021-03-29 DIAGNOSIS — I350 Nonrheumatic aortic (valve) stenosis: Secondary | ICD-10-CM | POA: Diagnosis not present

## 2021-03-31 DIAGNOSIS — I2510 Atherosclerotic heart disease of native coronary artery without angina pectoris: Secondary | ICD-10-CM | POA: Diagnosis not present

## 2021-03-31 DIAGNOSIS — I350 Nonrheumatic aortic (valve) stenosis: Secondary | ICD-10-CM | POA: Diagnosis not present

## 2021-03-31 DIAGNOSIS — I351 Nonrheumatic aortic (valve) insufficiency: Secondary | ICD-10-CM | POA: Diagnosis not present

## 2021-03-31 DIAGNOSIS — E785 Hyperlipidemia, unspecified: Secondary | ICD-10-CM | POA: Diagnosis not present

## 2021-03-31 DIAGNOSIS — I739 Peripheral vascular disease, unspecified: Secondary | ICD-10-CM | POA: Diagnosis not present

## 2021-03-31 DIAGNOSIS — I10 Essential (primary) hypertension: Secondary | ICD-10-CM | POA: Diagnosis not present

## 2021-04-03 DIAGNOSIS — E785 Hyperlipidemia, unspecified: Secondary | ICD-10-CM | POA: Diagnosis not present

## 2021-04-03 DIAGNOSIS — I10 Essential (primary) hypertension: Secondary | ICD-10-CM | POA: Diagnosis not present

## 2021-04-03 DIAGNOSIS — I2510 Atherosclerotic heart disease of native coronary artery without angina pectoris: Secondary | ICD-10-CM | POA: Diagnosis not present

## 2021-04-03 DIAGNOSIS — I739 Peripheral vascular disease, unspecified: Secondary | ICD-10-CM | POA: Diagnosis not present

## 2021-04-03 DIAGNOSIS — I351 Nonrheumatic aortic (valve) insufficiency: Secondary | ICD-10-CM | POA: Diagnosis not present

## 2021-04-03 DIAGNOSIS — I350 Nonrheumatic aortic (valve) stenosis: Secondary | ICD-10-CM | POA: Diagnosis not present

## 2021-04-05 DIAGNOSIS — I2510 Atherosclerotic heart disease of native coronary artery without angina pectoris: Secondary | ICD-10-CM | POA: Diagnosis not present

## 2021-04-05 DIAGNOSIS — I351 Nonrheumatic aortic (valve) insufficiency: Secondary | ICD-10-CM | POA: Diagnosis not present

## 2021-04-05 DIAGNOSIS — E785 Hyperlipidemia, unspecified: Secondary | ICD-10-CM | POA: Diagnosis not present

## 2021-04-05 DIAGNOSIS — I10 Essential (primary) hypertension: Secondary | ICD-10-CM | POA: Diagnosis not present

## 2021-04-05 DIAGNOSIS — I739 Peripheral vascular disease, unspecified: Secondary | ICD-10-CM | POA: Diagnosis not present

## 2021-04-05 DIAGNOSIS — I350 Nonrheumatic aortic (valve) stenosis: Secondary | ICD-10-CM | POA: Diagnosis not present

## 2021-04-06 DIAGNOSIS — I2510 Atherosclerotic heart disease of native coronary artery without angina pectoris: Secondary | ICD-10-CM | POA: Diagnosis not present

## 2021-04-06 DIAGNOSIS — I739 Peripheral vascular disease, unspecified: Secondary | ICD-10-CM | POA: Diagnosis not present

## 2021-04-06 DIAGNOSIS — I351 Nonrheumatic aortic (valve) insufficiency: Secondary | ICD-10-CM | POA: Diagnosis not present

## 2021-04-06 DIAGNOSIS — E785 Hyperlipidemia, unspecified: Secondary | ICD-10-CM | POA: Diagnosis not present

## 2021-04-06 DIAGNOSIS — I350 Nonrheumatic aortic (valve) stenosis: Secondary | ICD-10-CM | POA: Diagnosis not present

## 2021-04-06 DIAGNOSIS — I10 Essential (primary) hypertension: Secondary | ICD-10-CM | POA: Diagnosis not present

## 2021-04-07 ENCOUNTER — Ambulatory Visit: Payer: 59 | Admitting: Family

## 2021-04-07 DIAGNOSIS — I739 Peripheral vascular disease, unspecified: Secondary | ICD-10-CM | POA: Diagnosis not present

## 2021-04-07 DIAGNOSIS — I10 Essential (primary) hypertension: Secondary | ICD-10-CM | POA: Diagnosis not present

## 2021-04-07 DIAGNOSIS — I351 Nonrheumatic aortic (valve) insufficiency: Secondary | ICD-10-CM | POA: Diagnosis not present

## 2021-04-07 DIAGNOSIS — E785 Hyperlipidemia, unspecified: Secondary | ICD-10-CM | POA: Diagnosis not present

## 2021-04-07 DIAGNOSIS — I2510 Atherosclerotic heart disease of native coronary artery without angina pectoris: Secondary | ICD-10-CM | POA: Diagnosis not present

## 2021-04-07 DIAGNOSIS — I350 Nonrheumatic aortic (valve) stenosis: Secondary | ICD-10-CM | POA: Diagnosis not present

## 2021-04-10 DIAGNOSIS — I351 Nonrheumatic aortic (valve) insufficiency: Secondary | ICD-10-CM | POA: Diagnosis not present

## 2021-04-10 DIAGNOSIS — E785 Hyperlipidemia, unspecified: Secondary | ICD-10-CM | POA: Diagnosis not present

## 2021-04-10 DIAGNOSIS — I10 Essential (primary) hypertension: Secondary | ICD-10-CM | POA: Diagnosis not present

## 2021-04-10 DIAGNOSIS — I350 Nonrheumatic aortic (valve) stenosis: Secondary | ICD-10-CM | POA: Diagnosis not present

## 2021-04-10 DIAGNOSIS — I2510 Atherosclerotic heart disease of native coronary artery without angina pectoris: Secondary | ICD-10-CM | POA: Diagnosis not present

## 2021-04-10 DIAGNOSIS — I739 Peripheral vascular disease, unspecified: Secondary | ICD-10-CM | POA: Diagnosis not present

## 2021-04-11 ENCOUNTER — Encounter: Payer: 59 | Admitting: Family

## 2021-04-12 DIAGNOSIS — I350 Nonrheumatic aortic (valve) stenosis: Secondary | ICD-10-CM | POA: Diagnosis not present

## 2021-04-12 DIAGNOSIS — I739 Peripheral vascular disease, unspecified: Secondary | ICD-10-CM | POA: Diagnosis not present

## 2021-04-12 DIAGNOSIS — I351 Nonrheumatic aortic (valve) insufficiency: Secondary | ICD-10-CM | POA: Diagnosis not present

## 2021-04-12 DIAGNOSIS — E785 Hyperlipidemia, unspecified: Secondary | ICD-10-CM | POA: Diagnosis not present

## 2021-04-12 DIAGNOSIS — I10 Essential (primary) hypertension: Secondary | ICD-10-CM | POA: Diagnosis not present

## 2021-04-12 DIAGNOSIS — I2510 Atherosclerotic heart disease of native coronary artery without angina pectoris: Secondary | ICD-10-CM | POA: Diagnosis not present

## 2021-04-13 ENCOUNTER — Telehealth: Payer: Self-pay | Admitting: *Deleted

## 2021-04-13 NOTE — Telephone Encounter (Signed)
[name] with [facility] called requesting an order for Oxygen.   We haven't seen patient since June. Informed [name] that patient would need an appointment. She stated that she will let daughter know but didn't think they would bring patient in.   Nurse is going to have Hospice Dr. Moise Boring order instead.

## 2021-04-14 DIAGNOSIS — E785 Hyperlipidemia, unspecified: Secondary | ICD-10-CM | POA: Diagnosis not present

## 2021-04-14 DIAGNOSIS — I2510 Atherosclerotic heart disease of native coronary artery without angina pectoris: Secondary | ICD-10-CM | POA: Diagnosis not present

## 2021-04-14 DIAGNOSIS — I739 Peripheral vascular disease, unspecified: Secondary | ICD-10-CM | POA: Diagnosis not present

## 2021-04-14 DIAGNOSIS — I10 Essential (primary) hypertension: Secondary | ICD-10-CM | POA: Diagnosis not present

## 2021-04-14 DIAGNOSIS — I350 Nonrheumatic aortic (valve) stenosis: Secondary | ICD-10-CM | POA: Diagnosis not present

## 2021-04-14 DIAGNOSIS — I351 Nonrheumatic aortic (valve) insufficiency: Secondary | ICD-10-CM | POA: Diagnosis not present

## 2021-04-16 DIAGNOSIS — I739 Peripheral vascular disease, unspecified: Secondary | ICD-10-CM | POA: Diagnosis not present

## 2021-04-16 DIAGNOSIS — M1990 Unspecified osteoarthritis, unspecified site: Secondary | ICD-10-CM | POA: Diagnosis not present

## 2021-04-16 DIAGNOSIS — D649 Anemia, unspecified: Secondary | ICD-10-CM | POA: Diagnosis not present

## 2021-04-16 DIAGNOSIS — H409 Unspecified glaucoma: Secondary | ICD-10-CM | POA: Diagnosis not present

## 2021-04-16 DIAGNOSIS — I10 Essential (primary) hypertension: Secondary | ICD-10-CM | POA: Diagnosis not present

## 2021-04-16 DIAGNOSIS — I350 Nonrheumatic aortic (valve) stenosis: Secondary | ICD-10-CM | POA: Diagnosis not present

## 2021-04-16 DIAGNOSIS — I2510 Atherosclerotic heart disease of native coronary artery without angina pectoris: Secondary | ICD-10-CM | POA: Diagnosis not present

## 2021-04-16 DIAGNOSIS — F419 Anxiety disorder, unspecified: Secondary | ICD-10-CM | POA: Diagnosis not present

## 2021-04-16 DIAGNOSIS — E785 Hyperlipidemia, unspecified: Secondary | ICD-10-CM | POA: Diagnosis not present

## 2021-04-16 DIAGNOSIS — I351 Nonrheumatic aortic (valve) insufficiency: Secondary | ICD-10-CM | POA: Diagnosis not present

## 2021-04-16 DIAGNOSIS — K219 Gastro-esophageal reflux disease without esophagitis: Secondary | ICD-10-CM | POA: Diagnosis not present

## 2021-04-16 DIAGNOSIS — F0390 Unspecified dementia without behavioral disturbance: Secondary | ICD-10-CM | POA: Diagnosis not present

## 2021-04-17 DIAGNOSIS — E785 Hyperlipidemia, unspecified: Secondary | ICD-10-CM | POA: Diagnosis not present

## 2021-04-17 DIAGNOSIS — I10 Essential (primary) hypertension: Secondary | ICD-10-CM | POA: Diagnosis not present

## 2021-04-17 DIAGNOSIS — I350 Nonrheumatic aortic (valve) stenosis: Secondary | ICD-10-CM | POA: Diagnosis not present

## 2021-04-17 DIAGNOSIS — I2510 Atherosclerotic heart disease of native coronary artery without angina pectoris: Secondary | ICD-10-CM | POA: Diagnosis not present

## 2021-04-17 DIAGNOSIS — I351 Nonrheumatic aortic (valve) insufficiency: Secondary | ICD-10-CM | POA: Diagnosis not present

## 2021-04-17 DIAGNOSIS — I739 Peripheral vascular disease, unspecified: Secondary | ICD-10-CM | POA: Diagnosis not present

## 2021-04-17 NOTE — Progress Notes (Signed)
This encounter was created in error - please disregard.

## 2021-04-19 DIAGNOSIS — I2510 Atherosclerotic heart disease of native coronary artery without angina pectoris: Secondary | ICD-10-CM | POA: Diagnosis not present

## 2021-04-19 DIAGNOSIS — E785 Hyperlipidemia, unspecified: Secondary | ICD-10-CM | POA: Diagnosis not present

## 2021-04-19 DIAGNOSIS — I351 Nonrheumatic aortic (valve) insufficiency: Secondary | ICD-10-CM | POA: Diagnosis not present

## 2021-04-19 DIAGNOSIS — I10 Essential (primary) hypertension: Secondary | ICD-10-CM | POA: Diagnosis not present

## 2021-04-19 DIAGNOSIS — I739 Peripheral vascular disease, unspecified: Secondary | ICD-10-CM | POA: Diagnosis not present

## 2021-04-19 DIAGNOSIS — I350 Nonrheumatic aortic (valve) stenosis: Secondary | ICD-10-CM | POA: Diagnosis not present

## 2021-04-20 ENCOUNTER — Ambulatory Visit: Payer: 59 | Admitting: Family

## 2021-04-21 DIAGNOSIS — I739 Peripheral vascular disease, unspecified: Secondary | ICD-10-CM | POA: Diagnosis not present

## 2021-04-21 DIAGNOSIS — I10 Essential (primary) hypertension: Secondary | ICD-10-CM | POA: Diagnosis not present

## 2021-04-21 DIAGNOSIS — I350 Nonrheumatic aortic (valve) stenosis: Secondary | ICD-10-CM | POA: Diagnosis not present

## 2021-04-21 DIAGNOSIS — I351 Nonrheumatic aortic (valve) insufficiency: Secondary | ICD-10-CM | POA: Diagnosis not present

## 2021-04-21 DIAGNOSIS — E785 Hyperlipidemia, unspecified: Secondary | ICD-10-CM | POA: Diagnosis not present

## 2021-04-21 DIAGNOSIS — I2510 Atherosclerotic heart disease of native coronary artery without angina pectoris: Secondary | ICD-10-CM | POA: Diagnosis not present

## 2021-04-24 DIAGNOSIS — I351 Nonrheumatic aortic (valve) insufficiency: Secondary | ICD-10-CM | POA: Diagnosis not present

## 2021-04-24 DIAGNOSIS — I739 Peripheral vascular disease, unspecified: Secondary | ICD-10-CM | POA: Diagnosis not present

## 2021-04-24 DIAGNOSIS — I2510 Atherosclerotic heart disease of native coronary artery without angina pectoris: Secondary | ICD-10-CM | POA: Diagnosis not present

## 2021-04-24 DIAGNOSIS — I10 Essential (primary) hypertension: Secondary | ICD-10-CM | POA: Diagnosis not present

## 2021-04-24 DIAGNOSIS — I350 Nonrheumatic aortic (valve) stenosis: Secondary | ICD-10-CM | POA: Diagnosis not present

## 2021-04-24 DIAGNOSIS — E785 Hyperlipidemia, unspecified: Secondary | ICD-10-CM | POA: Diagnosis not present

## 2021-04-26 DIAGNOSIS — E785 Hyperlipidemia, unspecified: Secondary | ICD-10-CM | POA: Diagnosis not present

## 2021-04-26 DIAGNOSIS — I351 Nonrheumatic aortic (valve) insufficiency: Secondary | ICD-10-CM | POA: Diagnosis not present

## 2021-04-26 DIAGNOSIS — I2510 Atherosclerotic heart disease of native coronary artery without angina pectoris: Secondary | ICD-10-CM | POA: Diagnosis not present

## 2021-04-26 DIAGNOSIS — I739 Peripheral vascular disease, unspecified: Secondary | ICD-10-CM | POA: Diagnosis not present

## 2021-04-26 DIAGNOSIS — I350 Nonrheumatic aortic (valve) stenosis: Secondary | ICD-10-CM | POA: Diagnosis not present

## 2021-04-26 DIAGNOSIS — I10 Essential (primary) hypertension: Secondary | ICD-10-CM | POA: Diagnosis not present

## 2021-04-27 DIAGNOSIS — I350 Nonrheumatic aortic (valve) stenosis: Secondary | ICD-10-CM | POA: Diagnosis not present

## 2021-04-27 DIAGNOSIS — I351 Nonrheumatic aortic (valve) insufficiency: Secondary | ICD-10-CM | POA: Diagnosis not present

## 2021-04-27 DIAGNOSIS — E785 Hyperlipidemia, unspecified: Secondary | ICD-10-CM | POA: Diagnosis not present

## 2021-04-27 DIAGNOSIS — I10 Essential (primary) hypertension: Secondary | ICD-10-CM | POA: Diagnosis not present

## 2021-04-27 DIAGNOSIS — I2510 Atherosclerotic heart disease of native coronary artery without angina pectoris: Secondary | ICD-10-CM | POA: Diagnosis not present

## 2021-04-27 DIAGNOSIS — I739 Peripheral vascular disease, unspecified: Secondary | ICD-10-CM | POA: Diagnosis not present

## 2021-04-28 DIAGNOSIS — I350 Nonrheumatic aortic (valve) stenosis: Secondary | ICD-10-CM | POA: Diagnosis not present

## 2021-04-28 DIAGNOSIS — I10 Essential (primary) hypertension: Secondary | ICD-10-CM | POA: Diagnosis not present

## 2021-04-28 DIAGNOSIS — E785 Hyperlipidemia, unspecified: Secondary | ICD-10-CM | POA: Diagnosis not present

## 2021-04-28 DIAGNOSIS — I2510 Atherosclerotic heart disease of native coronary artery without angina pectoris: Secondary | ICD-10-CM | POA: Diagnosis not present

## 2021-04-28 DIAGNOSIS — I351 Nonrheumatic aortic (valve) insufficiency: Secondary | ICD-10-CM | POA: Diagnosis not present

## 2021-04-28 DIAGNOSIS — I739 Peripheral vascular disease, unspecified: Secondary | ICD-10-CM | POA: Diagnosis not present

## 2021-05-01 DIAGNOSIS — I351 Nonrheumatic aortic (valve) insufficiency: Secondary | ICD-10-CM | POA: Diagnosis not present

## 2021-05-01 DIAGNOSIS — I350 Nonrheumatic aortic (valve) stenosis: Secondary | ICD-10-CM | POA: Diagnosis not present

## 2021-05-01 DIAGNOSIS — I739 Peripheral vascular disease, unspecified: Secondary | ICD-10-CM | POA: Diagnosis not present

## 2021-05-01 DIAGNOSIS — I10 Essential (primary) hypertension: Secondary | ICD-10-CM | POA: Diagnosis not present

## 2021-05-01 DIAGNOSIS — I2510 Atherosclerotic heart disease of native coronary artery without angina pectoris: Secondary | ICD-10-CM | POA: Diagnosis not present

## 2021-05-01 DIAGNOSIS — E785 Hyperlipidemia, unspecified: Secondary | ICD-10-CM | POA: Diagnosis not present

## 2021-05-04 DIAGNOSIS — I739 Peripheral vascular disease, unspecified: Secondary | ICD-10-CM | POA: Diagnosis not present

## 2021-05-04 DIAGNOSIS — I350 Nonrheumatic aortic (valve) stenosis: Secondary | ICD-10-CM | POA: Diagnosis not present

## 2021-05-04 DIAGNOSIS — I351 Nonrheumatic aortic (valve) insufficiency: Secondary | ICD-10-CM | POA: Diagnosis not present

## 2021-05-04 DIAGNOSIS — I2510 Atherosclerotic heart disease of native coronary artery without angina pectoris: Secondary | ICD-10-CM | POA: Diagnosis not present

## 2021-05-04 DIAGNOSIS — E785 Hyperlipidemia, unspecified: Secondary | ICD-10-CM | POA: Diagnosis not present

## 2021-05-04 DIAGNOSIS — I10 Essential (primary) hypertension: Secondary | ICD-10-CM | POA: Diagnosis not present

## 2021-05-05 DIAGNOSIS — I10 Essential (primary) hypertension: Secondary | ICD-10-CM | POA: Diagnosis not present

## 2021-05-05 DIAGNOSIS — I351 Nonrheumatic aortic (valve) insufficiency: Secondary | ICD-10-CM | POA: Diagnosis not present

## 2021-05-05 DIAGNOSIS — I739 Peripheral vascular disease, unspecified: Secondary | ICD-10-CM | POA: Diagnosis not present

## 2021-05-05 DIAGNOSIS — E785 Hyperlipidemia, unspecified: Secondary | ICD-10-CM | POA: Diagnosis not present

## 2021-05-05 DIAGNOSIS — I2510 Atherosclerotic heart disease of native coronary artery without angina pectoris: Secondary | ICD-10-CM | POA: Diagnosis not present

## 2021-05-05 DIAGNOSIS — I350 Nonrheumatic aortic (valve) stenosis: Secondary | ICD-10-CM | POA: Diagnosis not present

## 2021-05-06 DIAGNOSIS — I350 Nonrheumatic aortic (valve) stenosis: Secondary | ICD-10-CM | POA: Diagnosis not present

## 2021-05-06 DIAGNOSIS — E785 Hyperlipidemia, unspecified: Secondary | ICD-10-CM | POA: Diagnosis not present

## 2021-05-06 DIAGNOSIS — I2510 Atherosclerotic heart disease of native coronary artery without angina pectoris: Secondary | ICD-10-CM | POA: Diagnosis not present

## 2021-05-06 DIAGNOSIS — I10 Essential (primary) hypertension: Secondary | ICD-10-CM | POA: Diagnosis not present

## 2021-05-06 DIAGNOSIS — I739 Peripheral vascular disease, unspecified: Secondary | ICD-10-CM | POA: Diagnosis not present

## 2021-05-06 DIAGNOSIS — I351 Nonrheumatic aortic (valve) insufficiency: Secondary | ICD-10-CM | POA: Diagnosis not present

## 2021-05-08 DIAGNOSIS — I350 Nonrheumatic aortic (valve) stenosis: Secondary | ICD-10-CM | POA: Diagnosis not present

## 2021-05-08 DIAGNOSIS — I10 Essential (primary) hypertension: Secondary | ICD-10-CM | POA: Diagnosis not present

## 2021-05-08 DIAGNOSIS — I2510 Atherosclerotic heart disease of native coronary artery without angina pectoris: Secondary | ICD-10-CM | POA: Diagnosis not present

## 2021-05-08 DIAGNOSIS — I351 Nonrheumatic aortic (valve) insufficiency: Secondary | ICD-10-CM | POA: Diagnosis not present

## 2021-05-08 DIAGNOSIS — E785 Hyperlipidemia, unspecified: Secondary | ICD-10-CM | POA: Diagnosis not present

## 2021-05-08 DIAGNOSIS — I739 Peripheral vascular disease, unspecified: Secondary | ICD-10-CM | POA: Diagnosis not present

## 2021-05-10 DIAGNOSIS — I2510 Atherosclerotic heart disease of native coronary artery without angina pectoris: Secondary | ICD-10-CM | POA: Diagnosis not present

## 2021-05-10 DIAGNOSIS — E785 Hyperlipidemia, unspecified: Secondary | ICD-10-CM | POA: Diagnosis not present

## 2021-05-10 DIAGNOSIS — I351 Nonrheumatic aortic (valve) insufficiency: Secondary | ICD-10-CM | POA: Diagnosis not present

## 2021-05-10 DIAGNOSIS — I350 Nonrheumatic aortic (valve) stenosis: Secondary | ICD-10-CM | POA: Diagnosis not present

## 2021-05-10 DIAGNOSIS — I10 Essential (primary) hypertension: Secondary | ICD-10-CM | POA: Diagnosis not present

## 2021-05-10 DIAGNOSIS — I739 Peripheral vascular disease, unspecified: Secondary | ICD-10-CM | POA: Diagnosis not present

## 2021-05-11 DIAGNOSIS — I739 Peripheral vascular disease, unspecified: Secondary | ICD-10-CM | POA: Diagnosis not present

## 2021-05-11 DIAGNOSIS — E785 Hyperlipidemia, unspecified: Secondary | ICD-10-CM | POA: Diagnosis not present

## 2021-05-11 DIAGNOSIS — I351 Nonrheumatic aortic (valve) insufficiency: Secondary | ICD-10-CM | POA: Diagnosis not present

## 2021-05-11 DIAGNOSIS — I2510 Atherosclerotic heart disease of native coronary artery without angina pectoris: Secondary | ICD-10-CM | POA: Diagnosis not present

## 2021-05-11 DIAGNOSIS — I10 Essential (primary) hypertension: Secondary | ICD-10-CM | POA: Diagnosis not present

## 2021-05-11 DIAGNOSIS — I350 Nonrheumatic aortic (valve) stenosis: Secondary | ICD-10-CM | POA: Diagnosis not present

## 2021-05-12 DIAGNOSIS — I351 Nonrheumatic aortic (valve) insufficiency: Secondary | ICD-10-CM | POA: Diagnosis not present

## 2021-05-12 DIAGNOSIS — I2510 Atherosclerotic heart disease of native coronary artery without angina pectoris: Secondary | ICD-10-CM | POA: Diagnosis not present

## 2021-05-12 DIAGNOSIS — I350 Nonrheumatic aortic (valve) stenosis: Secondary | ICD-10-CM | POA: Diagnosis not present

## 2021-05-12 DIAGNOSIS — E785 Hyperlipidemia, unspecified: Secondary | ICD-10-CM | POA: Diagnosis not present

## 2021-05-12 DIAGNOSIS — I10 Essential (primary) hypertension: Secondary | ICD-10-CM | POA: Diagnosis not present

## 2021-05-12 DIAGNOSIS — I739 Peripheral vascular disease, unspecified: Secondary | ICD-10-CM | POA: Diagnosis not present

## 2021-05-15 DIAGNOSIS — I739 Peripheral vascular disease, unspecified: Secondary | ICD-10-CM | POA: Diagnosis not present

## 2021-05-15 DIAGNOSIS — E785 Hyperlipidemia, unspecified: Secondary | ICD-10-CM | POA: Diagnosis not present

## 2021-05-15 DIAGNOSIS — I2510 Atherosclerotic heart disease of native coronary artery without angina pectoris: Secondary | ICD-10-CM | POA: Diagnosis not present

## 2021-05-15 DIAGNOSIS — I351 Nonrheumatic aortic (valve) insufficiency: Secondary | ICD-10-CM | POA: Diagnosis not present

## 2021-05-15 DIAGNOSIS — I10 Essential (primary) hypertension: Secondary | ICD-10-CM | POA: Diagnosis not present

## 2021-05-15 DIAGNOSIS — I350 Nonrheumatic aortic (valve) stenosis: Secondary | ICD-10-CM | POA: Diagnosis not present

## 2021-05-17 DIAGNOSIS — K219 Gastro-esophageal reflux disease without esophagitis: Secondary | ICD-10-CM | POA: Diagnosis not present

## 2021-05-17 DIAGNOSIS — I351 Nonrheumatic aortic (valve) insufficiency: Secondary | ICD-10-CM | POA: Diagnosis not present

## 2021-05-17 DIAGNOSIS — I2510 Atherosclerotic heart disease of native coronary artery without angina pectoris: Secondary | ICD-10-CM | POA: Diagnosis not present

## 2021-05-17 DIAGNOSIS — H409 Unspecified glaucoma: Secondary | ICD-10-CM | POA: Diagnosis not present

## 2021-05-17 DIAGNOSIS — M1990 Unspecified osteoarthritis, unspecified site: Secondary | ICD-10-CM | POA: Diagnosis not present

## 2021-05-17 DIAGNOSIS — I10 Essential (primary) hypertension: Secondary | ICD-10-CM | POA: Diagnosis not present

## 2021-05-17 DIAGNOSIS — I739 Peripheral vascular disease, unspecified: Secondary | ICD-10-CM | POA: Diagnosis not present

## 2021-05-17 DIAGNOSIS — I350 Nonrheumatic aortic (valve) stenosis: Secondary | ICD-10-CM | POA: Diagnosis not present

## 2021-05-17 DIAGNOSIS — D649 Anemia, unspecified: Secondary | ICD-10-CM | POA: Diagnosis not present

## 2021-05-17 DIAGNOSIS — F0390 Unspecified dementia without behavioral disturbance: Secondary | ICD-10-CM | POA: Diagnosis not present

## 2021-05-17 DIAGNOSIS — F419 Anxiety disorder, unspecified: Secondary | ICD-10-CM | POA: Diagnosis not present

## 2021-05-17 DIAGNOSIS — E785 Hyperlipidemia, unspecified: Secondary | ICD-10-CM | POA: Diagnosis not present

## 2021-05-18 DIAGNOSIS — I350 Nonrheumatic aortic (valve) stenosis: Secondary | ICD-10-CM | POA: Diagnosis not present

## 2021-05-18 DIAGNOSIS — E785 Hyperlipidemia, unspecified: Secondary | ICD-10-CM | POA: Diagnosis not present

## 2021-05-18 DIAGNOSIS — I739 Peripheral vascular disease, unspecified: Secondary | ICD-10-CM | POA: Diagnosis not present

## 2021-05-18 DIAGNOSIS — I10 Essential (primary) hypertension: Secondary | ICD-10-CM | POA: Diagnosis not present

## 2021-05-18 DIAGNOSIS — I2510 Atherosclerotic heart disease of native coronary artery without angina pectoris: Secondary | ICD-10-CM | POA: Diagnosis not present

## 2021-05-18 DIAGNOSIS — I351 Nonrheumatic aortic (valve) insufficiency: Secondary | ICD-10-CM | POA: Diagnosis not present

## 2021-05-19 DIAGNOSIS — I10 Essential (primary) hypertension: Secondary | ICD-10-CM | POA: Diagnosis not present

## 2021-05-19 DIAGNOSIS — E785 Hyperlipidemia, unspecified: Secondary | ICD-10-CM | POA: Diagnosis not present

## 2021-05-19 DIAGNOSIS — I739 Peripheral vascular disease, unspecified: Secondary | ICD-10-CM | POA: Diagnosis not present

## 2021-05-19 DIAGNOSIS — I351 Nonrheumatic aortic (valve) insufficiency: Secondary | ICD-10-CM | POA: Diagnosis not present

## 2021-05-19 DIAGNOSIS — I350 Nonrheumatic aortic (valve) stenosis: Secondary | ICD-10-CM | POA: Diagnosis not present

## 2021-05-19 DIAGNOSIS — I2510 Atherosclerotic heart disease of native coronary artery without angina pectoris: Secondary | ICD-10-CM | POA: Diagnosis not present

## 2021-05-20 DIAGNOSIS — I351 Nonrheumatic aortic (valve) insufficiency: Secondary | ICD-10-CM | POA: Diagnosis not present

## 2021-05-20 DIAGNOSIS — I2510 Atherosclerotic heart disease of native coronary artery without angina pectoris: Secondary | ICD-10-CM | POA: Diagnosis not present

## 2021-05-20 DIAGNOSIS — I350 Nonrheumatic aortic (valve) stenosis: Secondary | ICD-10-CM | POA: Diagnosis not present

## 2021-05-20 DIAGNOSIS — E785 Hyperlipidemia, unspecified: Secondary | ICD-10-CM | POA: Diagnosis not present

## 2021-05-20 DIAGNOSIS — I10 Essential (primary) hypertension: Secondary | ICD-10-CM | POA: Diagnosis not present

## 2021-05-20 DIAGNOSIS — I739 Peripheral vascular disease, unspecified: Secondary | ICD-10-CM | POA: Diagnosis not present

## 2021-05-22 DIAGNOSIS — E785 Hyperlipidemia, unspecified: Secondary | ICD-10-CM | POA: Diagnosis not present

## 2021-05-22 DIAGNOSIS — I350 Nonrheumatic aortic (valve) stenosis: Secondary | ICD-10-CM | POA: Diagnosis not present

## 2021-05-22 DIAGNOSIS — I2510 Atherosclerotic heart disease of native coronary artery without angina pectoris: Secondary | ICD-10-CM | POA: Diagnosis not present

## 2021-05-22 DIAGNOSIS — I739 Peripheral vascular disease, unspecified: Secondary | ICD-10-CM | POA: Diagnosis not present

## 2021-05-22 DIAGNOSIS — I351 Nonrheumatic aortic (valve) insufficiency: Secondary | ICD-10-CM | POA: Diagnosis not present

## 2021-05-22 DIAGNOSIS — I10 Essential (primary) hypertension: Secondary | ICD-10-CM | POA: Diagnosis not present

## 2021-05-24 DIAGNOSIS — E785 Hyperlipidemia, unspecified: Secondary | ICD-10-CM | POA: Diagnosis not present

## 2021-05-24 DIAGNOSIS — I351 Nonrheumatic aortic (valve) insufficiency: Secondary | ICD-10-CM | POA: Diagnosis not present

## 2021-05-24 DIAGNOSIS — I2510 Atherosclerotic heart disease of native coronary artery without angina pectoris: Secondary | ICD-10-CM | POA: Diagnosis not present

## 2021-05-24 DIAGNOSIS — I10 Essential (primary) hypertension: Secondary | ICD-10-CM | POA: Diagnosis not present

## 2021-05-24 DIAGNOSIS — I350 Nonrheumatic aortic (valve) stenosis: Secondary | ICD-10-CM | POA: Diagnosis not present

## 2021-05-24 DIAGNOSIS — I739 Peripheral vascular disease, unspecified: Secondary | ICD-10-CM | POA: Diagnosis not present

## 2021-05-25 DIAGNOSIS — I2510 Atherosclerotic heart disease of native coronary artery without angina pectoris: Secondary | ICD-10-CM | POA: Diagnosis not present

## 2021-05-25 DIAGNOSIS — E785 Hyperlipidemia, unspecified: Secondary | ICD-10-CM | POA: Diagnosis not present

## 2021-05-25 DIAGNOSIS — I739 Peripheral vascular disease, unspecified: Secondary | ICD-10-CM | POA: Diagnosis not present

## 2021-05-25 DIAGNOSIS — I351 Nonrheumatic aortic (valve) insufficiency: Secondary | ICD-10-CM | POA: Diagnosis not present

## 2021-05-25 DIAGNOSIS — I10 Essential (primary) hypertension: Secondary | ICD-10-CM | POA: Diagnosis not present

## 2021-05-25 DIAGNOSIS — I350 Nonrheumatic aortic (valve) stenosis: Secondary | ICD-10-CM | POA: Diagnosis not present

## 2021-05-26 DIAGNOSIS — I350 Nonrheumatic aortic (valve) stenosis: Secondary | ICD-10-CM | POA: Diagnosis not present

## 2021-05-26 DIAGNOSIS — E785 Hyperlipidemia, unspecified: Secondary | ICD-10-CM | POA: Diagnosis not present

## 2021-05-26 DIAGNOSIS — I739 Peripheral vascular disease, unspecified: Secondary | ICD-10-CM | POA: Diagnosis not present

## 2021-05-26 DIAGNOSIS — I10 Essential (primary) hypertension: Secondary | ICD-10-CM | POA: Diagnosis not present

## 2021-05-26 DIAGNOSIS — I351 Nonrheumatic aortic (valve) insufficiency: Secondary | ICD-10-CM | POA: Diagnosis not present

## 2021-05-26 DIAGNOSIS — I2510 Atherosclerotic heart disease of native coronary artery without angina pectoris: Secondary | ICD-10-CM | POA: Diagnosis not present

## 2021-05-29 DIAGNOSIS — I351 Nonrheumatic aortic (valve) insufficiency: Secondary | ICD-10-CM | POA: Diagnosis not present

## 2021-05-29 DIAGNOSIS — E785 Hyperlipidemia, unspecified: Secondary | ICD-10-CM | POA: Diagnosis not present

## 2021-05-29 DIAGNOSIS — I350 Nonrheumatic aortic (valve) stenosis: Secondary | ICD-10-CM | POA: Diagnosis not present

## 2021-05-29 DIAGNOSIS — I2510 Atherosclerotic heart disease of native coronary artery without angina pectoris: Secondary | ICD-10-CM | POA: Diagnosis not present

## 2021-05-29 DIAGNOSIS — I10 Essential (primary) hypertension: Secondary | ICD-10-CM | POA: Diagnosis not present

## 2021-05-29 DIAGNOSIS — I739 Peripheral vascular disease, unspecified: Secondary | ICD-10-CM | POA: Diagnosis not present

## 2021-05-31 DIAGNOSIS — I10 Essential (primary) hypertension: Secondary | ICD-10-CM | POA: Diagnosis not present

## 2021-05-31 DIAGNOSIS — I350 Nonrheumatic aortic (valve) stenosis: Secondary | ICD-10-CM | POA: Diagnosis not present

## 2021-05-31 DIAGNOSIS — I2510 Atherosclerotic heart disease of native coronary artery without angina pectoris: Secondary | ICD-10-CM | POA: Diagnosis not present

## 2021-05-31 DIAGNOSIS — I351 Nonrheumatic aortic (valve) insufficiency: Secondary | ICD-10-CM | POA: Diagnosis not present

## 2021-05-31 DIAGNOSIS — I739 Peripheral vascular disease, unspecified: Secondary | ICD-10-CM | POA: Diagnosis not present

## 2021-05-31 DIAGNOSIS — E785 Hyperlipidemia, unspecified: Secondary | ICD-10-CM | POA: Diagnosis not present

## 2021-06-01 DIAGNOSIS — I351 Nonrheumatic aortic (valve) insufficiency: Secondary | ICD-10-CM | POA: Diagnosis not present

## 2021-06-01 DIAGNOSIS — I2510 Atherosclerotic heart disease of native coronary artery without angina pectoris: Secondary | ICD-10-CM | POA: Diagnosis not present

## 2021-06-01 DIAGNOSIS — I739 Peripheral vascular disease, unspecified: Secondary | ICD-10-CM | POA: Diagnosis not present

## 2021-06-01 DIAGNOSIS — I350 Nonrheumatic aortic (valve) stenosis: Secondary | ICD-10-CM | POA: Diagnosis not present

## 2021-06-01 DIAGNOSIS — I10 Essential (primary) hypertension: Secondary | ICD-10-CM | POA: Diagnosis not present

## 2021-06-01 DIAGNOSIS — E785 Hyperlipidemia, unspecified: Secondary | ICD-10-CM | POA: Diagnosis not present

## 2021-06-02 DIAGNOSIS — I350 Nonrheumatic aortic (valve) stenosis: Secondary | ICD-10-CM | POA: Diagnosis not present

## 2021-06-02 DIAGNOSIS — I10 Essential (primary) hypertension: Secondary | ICD-10-CM | POA: Diagnosis not present

## 2021-06-02 DIAGNOSIS — I2510 Atherosclerotic heart disease of native coronary artery without angina pectoris: Secondary | ICD-10-CM | POA: Diagnosis not present

## 2021-06-02 DIAGNOSIS — I351 Nonrheumatic aortic (valve) insufficiency: Secondary | ICD-10-CM | POA: Diagnosis not present

## 2021-06-02 DIAGNOSIS — I739 Peripheral vascular disease, unspecified: Secondary | ICD-10-CM | POA: Diagnosis not present

## 2021-06-02 DIAGNOSIS — E785 Hyperlipidemia, unspecified: Secondary | ICD-10-CM | POA: Diagnosis not present

## 2021-06-05 DIAGNOSIS — I10 Essential (primary) hypertension: Secondary | ICD-10-CM | POA: Diagnosis not present

## 2021-06-05 DIAGNOSIS — I351 Nonrheumatic aortic (valve) insufficiency: Secondary | ICD-10-CM | POA: Diagnosis not present

## 2021-06-05 DIAGNOSIS — I350 Nonrheumatic aortic (valve) stenosis: Secondary | ICD-10-CM | POA: Diagnosis not present

## 2021-06-05 DIAGNOSIS — E785 Hyperlipidemia, unspecified: Secondary | ICD-10-CM | POA: Diagnosis not present

## 2021-06-05 DIAGNOSIS — I2510 Atherosclerotic heart disease of native coronary artery without angina pectoris: Secondary | ICD-10-CM | POA: Diagnosis not present

## 2021-06-05 DIAGNOSIS — I739 Peripheral vascular disease, unspecified: Secondary | ICD-10-CM | POA: Diagnosis not present

## 2021-06-07 DIAGNOSIS — I2510 Atherosclerotic heart disease of native coronary artery without angina pectoris: Secondary | ICD-10-CM | POA: Diagnosis not present

## 2021-06-07 DIAGNOSIS — I350 Nonrheumatic aortic (valve) stenosis: Secondary | ICD-10-CM | POA: Diagnosis not present

## 2021-06-07 DIAGNOSIS — I739 Peripheral vascular disease, unspecified: Secondary | ICD-10-CM | POA: Diagnosis not present

## 2021-06-07 DIAGNOSIS — I10 Essential (primary) hypertension: Secondary | ICD-10-CM | POA: Diagnosis not present

## 2021-06-07 DIAGNOSIS — E785 Hyperlipidemia, unspecified: Secondary | ICD-10-CM | POA: Diagnosis not present

## 2021-06-07 DIAGNOSIS — I351 Nonrheumatic aortic (valve) insufficiency: Secondary | ICD-10-CM | POA: Diagnosis not present

## 2021-06-08 DIAGNOSIS — I10 Essential (primary) hypertension: Secondary | ICD-10-CM | POA: Diagnosis not present

## 2021-06-08 DIAGNOSIS — I739 Peripheral vascular disease, unspecified: Secondary | ICD-10-CM | POA: Diagnosis not present

## 2021-06-08 DIAGNOSIS — I351 Nonrheumatic aortic (valve) insufficiency: Secondary | ICD-10-CM | POA: Diagnosis not present

## 2021-06-08 DIAGNOSIS — I2510 Atherosclerotic heart disease of native coronary artery without angina pectoris: Secondary | ICD-10-CM | POA: Diagnosis not present

## 2021-06-08 DIAGNOSIS — E785 Hyperlipidemia, unspecified: Secondary | ICD-10-CM | POA: Diagnosis not present

## 2021-06-08 DIAGNOSIS — I350 Nonrheumatic aortic (valve) stenosis: Secondary | ICD-10-CM | POA: Diagnosis not present

## 2021-06-09 DIAGNOSIS — E785 Hyperlipidemia, unspecified: Secondary | ICD-10-CM | POA: Diagnosis not present

## 2021-06-09 DIAGNOSIS — I739 Peripheral vascular disease, unspecified: Secondary | ICD-10-CM | POA: Diagnosis not present

## 2021-06-09 DIAGNOSIS — I350 Nonrheumatic aortic (valve) stenosis: Secondary | ICD-10-CM | POA: Diagnosis not present

## 2021-06-09 DIAGNOSIS — I10 Essential (primary) hypertension: Secondary | ICD-10-CM | POA: Diagnosis not present

## 2021-06-09 DIAGNOSIS — I2510 Atherosclerotic heart disease of native coronary artery without angina pectoris: Secondary | ICD-10-CM | POA: Diagnosis not present

## 2021-06-09 DIAGNOSIS — I351 Nonrheumatic aortic (valve) insufficiency: Secondary | ICD-10-CM | POA: Diagnosis not present

## 2021-06-12 DIAGNOSIS — I350 Nonrheumatic aortic (valve) stenosis: Secondary | ICD-10-CM | POA: Diagnosis not present

## 2021-06-12 DIAGNOSIS — E785 Hyperlipidemia, unspecified: Secondary | ICD-10-CM | POA: Diagnosis not present

## 2021-06-12 DIAGNOSIS — I10 Essential (primary) hypertension: Secondary | ICD-10-CM | POA: Diagnosis not present

## 2021-06-12 DIAGNOSIS — I739 Peripheral vascular disease, unspecified: Secondary | ICD-10-CM | POA: Diagnosis not present

## 2021-06-12 DIAGNOSIS — I2510 Atherosclerotic heart disease of native coronary artery without angina pectoris: Secondary | ICD-10-CM | POA: Diagnosis not present

## 2021-06-12 DIAGNOSIS — I351 Nonrheumatic aortic (valve) insufficiency: Secondary | ICD-10-CM | POA: Diagnosis not present

## 2021-06-13 DIAGNOSIS — I2510 Atherosclerotic heart disease of native coronary artery without angina pectoris: Secondary | ICD-10-CM | POA: Diagnosis not present

## 2021-06-13 DIAGNOSIS — I739 Peripheral vascular disease, unspecified: Secondary | ICD-10-CM | POA: Diagnosis not present

## 2021-06-13 DIAGNOSIS — I10 Essential (primary) hypertension: Secondary | ICD-10-CM | POA: Diagnosis not present

## 2021-06-13 DIAGNOSIS — E785 Hyperlipidemia, unspecified: Secondary | ICD-10-CM | POA: Diagnosis not present

## 2021-06-13 DIAGNOSIS — I351 Nonrheumatic aortic (valve) insufficiency: Secondary | ICD-10-CM | POA: Diagnosis not present

## 2021-06-13 DIAGNOSIS — I350 Nonrheumatic aortic (valve) stenosis: Secondary | ICD-10-CM | POA: Diagnosis not present

## 2021-06-14 ENCOUNTER — Emergency Department (HOSPITAL_BASED_OUTPATIENT_CLINIC_OR_DEPARTMENT_OTHER): Payer: Medicare Other | Admitting: Radiology

## 2021-06-14 ENCOUNTER — Emergency Department (HOSPITAL_BASED_OUTPATIENT_CLINIC_OR_DEPARTMENT_OTHER)
Admission: EM | Admit: 2021-06-14 | Discharge: 2021-06-14 | Disposition: A | Discharge status: Home / Self Care | Payer: Medicare Other | Attending: Emergency Medicine | Admitting: Emergency Medicine

## 2021-06-14 ENCOUNTER — Other Ambulatory Visit (HOSPITAL_BASED_OUTPATIENT_CLINIC_OR_DEPARTMENT_OTHER): Payer: Self-pay

## 2021-06-14 ENCOUNTER — Emergency Department (HOSPITAL_BASED_OUTPATIENT_CLINIC_OR_DEPARTMENT_OTHER): Payer: Medicare Other

## 2021-06-14 ENCOUNTER — Other Ambulatory Visit: Payer: Self-pay

## 2021-06-14 DIAGNOSIS — I10 Essential (primary) hypertension: Secondary | ICD-10-CM | POA: Insufficient documentation

## 2021-06-14 DIAGNOSIS — M47812 Spondylosis without myelopathy or radiculopathy, cervical region: Secondary | ICD-10-CM | POA: Diagnosis not present

## 2021-06-14 DIAGNOSIS — R410 Disorientation, unspecified: Secondary | ICD-10-CM | POA: Diagnosis not present

## 2021-06-14 DIAGNOSIS — F419 Anxiety disorder, unspecified: Secondary | ICD-10-CM | POA: Diagnosis not present

## 2021-06-14 DIAGNOSIS — D649 Anemia, unspecified: Secondary | ICD-10-CM | POA: Diagnosis not present

## 2021-06-14 DIAGNOSIS — I2510 Atherosclerotic heart disease of native coronary artery without angina pectoris: Secondary | ICD-10-CM | POA: Insufficient documentation

## 2021-06-14 DIAGNOSIS — K219 Gastro-esophageal reflux disease without esophagitis: Secondary | ICD-10-CM | POA: Diagnosis not present

## 2021-06-14 DIAGNOSIS — E785 Hyperlipidemia, unspecified: Secondary | ICD-10-CM | POA: Diagnosis not present

## 2021-06-14 DIAGNOSIS — M542 Cervicalgia: Secondary | ICD-10-CM | POA: Diagnosis not present

## 2021-06-14 DIAGNOSIS — R519 Headache, unspecified: Secondary | ICD-10-CM | POA: Diagnosis not present

## 2021-06-14 DIAGNOSIS — Z86010 Personal history of colonic polyps: Secondary | ICD-10-CM | POA: Diagnosis not present

## 2021-06-14 DIAGNOSIS — I350 Nonrheumatic aortic (valve) stenosis: Secondary | ICD-10-CM | POA: Diagnosis not present

## 2021-06-14 DIAGNOSIS — I739 Peripheral vascular disease, unspecified: Secondary | ICD-10-CM | POA: Diagnosis not present

## 2021-06-14 DIAGNOSIS — I351 Nonrheumatic aortic (valve) insufficiency: Secondary | ICD-10-CM | POA: Diagnosis not present

## 2021-06-14 DIAGNOSIS — W19XXXA Unspecified fall, initial encounter: Secondary | ICD-10-CM

## 2021-06-14 DIAGNOSIS — M25511 Pain in right shoulder: Secondary | ICD-10-CM | POA: Diagnosis not present

## 2021-06-14 DIAGNOSIS — H409 Unspecified glaucoma: Secondary | ICD-10-CM | POA: Diagnosis not present

## 2021-06-14 DIAGNOSIS — Z043 Encounter for examination and observation following other accident: Secondary | ICD-10-CM | POA: Diagnosis not present

## 2021-06-14 DIAGNOSIS — M1990 Unspecified osteoarthritis, unspecified site: Secondary | ICD-10-CM | POA: Diagnosis not present

## 2021-06-14 DIAGNOSIS — M2578 Osteophyte, vertebrae: Secondary | ICD-10-CM | POA: Diagnosis not present

## 2021-06-14 DIAGNOSIS — F0390 Unspecified dementia without behavioral disturbance: Secondary | ICD-10-CM | POA: Diagnosis not present

## 2021-06-14 MED ORDER — DICLOFENAC SODIUM 1 % EX GEL
2.0000 g | Freq: Four times a day (QID) | CUTANEOUS | 0 refills | Status: AC
  Filled 2021-06-14: qty 100, 13d supply, fill #0

## 2021-06-14 NOTE — ED Triage Notes (Signed)
[place] this morning trying to get out of bed and fell onto right shoulder. Pt complains of right shoulder and arm pain and left thumb pain. Daughter did not witness the fall but found him in the floor stated he was prob on the ground for 22mins. No loc, no blood thinners. Pt is on hospice and his CNA and nurse were at the house to check him out and advised for him to be assessed.  ?

## 2021-06-14 NOTE — Discharge Instructions (Signed)
You were seen in the emergency room today after a fall.  Your CT scans of the head and neck did not show any acute findings.  The x-ray of your right shoulder did not show any broken bone or dislocation.  I have called in some topical pain medicine which you can rub on your shoulder.  Please continue to follow with your outpatient primary care team and return with any new or suddenly worsening symptoms. ?

## 2021-06-14 NOTE — Progress Notes (Signed)
Manufacturing engineer [facility]) Hospital Liaison: RN note  ?  ?[facility] with [facility] terminal diagnosis of  severe critical aortic stenosis.  [facility] hospital liaison will follow for disposition and coordination of care.  ?  ?A Please do not hesitate to call with questions.   ? ?Thank you,   ?Farrel Gordon, RN, CCM      ?[facility] Hospital Liaison   ?[phone] ?
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
## 2021-06-15 DIAGNOSIS — I739 Peripheral vascular disease, unspecified: Secondary | ICD-10-CM | POA: Diagnosis not present

## 2021-06-15 DIAGNOSIS — I10 Essential (primary) hypertension: Secondary | ICD-10-CM | POA: Diagnosis not present

## 2021-06-15 DIAGNOSIS — E785 Hyperlipidemia, unspecified: Secondary | ICD-10-CM | POA: Diagnosis not present

## 2021-06-15 DIAGNOSIS — I350 Nonrheumatic aortic (valve) stenosis: Secondary | ICD-10-CM | POA: Diagnosis not present

## 2021-06-15 DIAGNOSIS — I351 Nonrheumatic aortic (valve) insufficiency: Secondary | ICD-10-CM | POA: Diagnosis not present

## 2021-06-15 DIAGNOSIS — I2510 Atherosclerotic heart disease of native coronary artery without angina pectoris: Secondary | ICD-10-CM | POA: Diagnosis not present

## 2021-06-16 DIAGNOSIS — E785 Hyperlipidemia, unspecified: Secondary | ICD-10-CM | POA: Diagnosis not present

## 2021-06-16 DIAGNOSIS — I351 Nonrheumatic aortic (valve) insufficiency: Secondary | ICD-10-CM | POA: Diagnosis not present

## 2021-06-16 DIAGNOSIS — I10 Essential (primary) hypertension: Secondary | ICD-10-CM | POA: Diagnosis not present

## 2021-06-16 DIAGNOSIS — I2510 Atherosclerotic heart disease of native coronary artery without angina pectoris: Secondary | ICD-10-CM | POA: Diagnosis not present

## 2021-06-16 DIAGNOSIS — I350 Nonrheumatic aortic (valve) stenosis: Secondary | ICD-10-CM | POA: Diagnosis not present

## 2021-06-16 DIAGNOSIS — I739 Peripheral vascular disease, unspecified: Secondary | ICD-10-CM | POA: Diagnosis not present

## 2021-06-19 DIAGNOSIS — I350 Nonrheumatic aortic (valve) stenosis: Secondary | ICD-10-CM | POA: Diagnosis not present

## 2021-06-19 DIAGNOSIS — I739 Peripheral vascular disease, unspecified: Secondary | ICD-10-CM | POA: Diagnosis not present

## 2021-06-19 DIAGNOSIS — I10 Essential (primary) hypertension: Secondary | ICD-10-CM | POA: Diagnosis not present

## 2021-06-19 DIAGNOSIS — I2510 Atherosclerotic heart disease of native coronary artery without angina pectoris: Secondary | ICD-10-CM | POA: Diagnosis not present

## 2021-06-19 DIAGNOSIS — I351 Nonrheumatic aortic (valve) insufficiency: Secondary | ICD-10-CM | POA: Diagnosis not present

## 2021-06-19 DIAGNOSIS — E785 Hyperlipidemia, unspecified: Secondary | ICD-10-CM | POA: Diagnosis not present

## 2021-06-21 DIAGNOSIS — I350 Nonrheumatic aortic (valve) stenosis: Secondary | ICD-10-CM | POA: Diagnosis not present

## 2021-06-21 DIAGNOSIS — I10 Essential (primary) hypertension: Secondary | ICD-10-CM | POA: Diagnosis not present

## 2021-06-21 DIAGNOSIS — E785 Hyperlipidemia, unspecified: Secondary | ICD-10-CM | POA: Diagnosis not present

## 2021-06-21 DIAGNOSIS — I351 Nonrheumatic aortic (valve) insufficiency: Secondary | ICD-10-CM | POA: Diagnosis not present

## 2021-06-21 DIAGNOSIS — I2510 Atherosclerotic heart disease of native coronary artery without angina pectoris: Secondary | ICD-10-CM | POA: Diagnosis not present

## 2021-06-21 DIAGNOSIS — I739 Peripheral vascular disease, unspecified: Secondary | ICD-10-CM | POA: Diagnosis not present

## 2021-06-22 DIAGNOSIS — I10 Essential (primary) hypertension: Secondary | ICD-10-CM | POA: Diagnosis not present

## 2021-06-22 DIAGNOSIS — I351 Nonrheumatic aortic (valve) insufficiency: Secondary | ICD-10-CM | POA: Diagnosis not present

## 2021-06-22 DIAGNOSIS — I739 Peripheral vascular disease, unspecified: Secondary | ICD-10-CM | POA: Diagnosis not present

## 2021-06-22 DIAGNOSIS — E785 Hyperlipidemia, unspecified: Secondary | ICD-10-CM | POA: Diagnosis not present

## 2021-06-22 DIAGNOSIS — I350 Nonrheumatic aortic (valve) stenosis: Secondary | ICD-10-CM | POA: Diagnosis not present

## 2021-06-22 DIAGNOSIS — I2510 Atherosclerotic heart disease of native coronary artery without angina pectoris: Secondary | ICD-10-CM | POA: Diagnosis not present

## 2021-06-23 DIAGNOSIS — I2510 Atherosclerotic heart disease of native coronary artery without angina pectoris: Secondary | ICD-10-CM | POA: Diagnosis not present

## 2021-06-23 DIAGNOSIS — I350 Nonrheumatic aortic (valve) stenosis: Secondary | ICD-10-CM | POA: Diagnosis not present

## 2021-06-23 DIAGNOSIS — E785 Hyperlipidemia, unspecified: Secondary | ICD-10-CM | POA: Diagnosis not present

## 2021-06-23 DIAGNOSIS — I10 Essential (primary) hypertension: Secondary | ICD-10-CM | POA: Diagnosis not present

## 2021-06-23 DIAGNOSIS — I739 Peripheral vascular disease, unspecified: Secondary | ICD-10-CM | POA: Diagnosis not present

## 2021-06-23 DIAGNOSIS — I351 Nonrheumatic aortic (valve) insufficiency: Secondary | ICD-10-CM | POA: Diagnosis not present

## 2021-06-26 DIAGNOSIS — I351 Nonrheumatic aortic (valve) insufficiency: Secondary | ICD-10-CM | POA: Diagnosis not present

## 2021-06-26 DIAGNOSIS — I350 Nonrheumatic aortic (valve) stenosis: Secondary | ICD-10-CM | POA: Diagnosis not present

## 2021-06-26 DIAGNOSIS — I2510 Atherosclerotic heart disease of native coronary artery without angina pectoris: Secondary | ICD-10-CM | POA: Diagnosis not present

## 2021-06-26 DIAGNOSIS — I10 Essential (primary) hypertension: Secondary | ICD-10-CM | POA: Diagnosis not present

## 2021-06-26 DIAGNOSIS — E785 Hyperlipidemia, unspecified: Secondary | ICD-10-CM | POA: Diagnosis not present

## 2021-06-26 DIAGNOSIS — I739 Peripheral vascular disease, unspecified: Secondary | ICD-10-CM | POA: Diagnosis not present

## 2021-06-28 DIAGNOSIS — I351 Nonrheumatic aortic (valve) insufficiency: Secondary | ICD-10-CM | POA: Diagnosis not present

## 2021-06-28 DIAGNOSIS — I2510 Atherosclerotic heart disease of native coronary artery without angina pectoris: Secondary | ICD-10-CM | POA: Diagnosis not present

## 2021-06-28 DIAGNOSIS — I739 Peripheral vascular disease, unspecified: Secondary | ICD-10-CM | POA: Diagnosis not present

## 2021-06-28 DIAGNOSIS — I10 Essential (primary) hypertension: Secondary | ICD-10-CM | POA: Diagnosis not present

## 2021-06-28 DIAGNOSIS — I350 Nonrheumatic aortic (valve) stenosis: Secondary | ICD-10-CM | POA: Diagnosis not present

## 2021-06-28 DIAGNOSIS — E785 Hyperlipidemia, unspecified: Secondary | ICD-10-CM | POA: Diagnosis not present

## 2021-06-29 DIAGNOSIS — E785 Hyperlipidemia, unspecified: Secondary | ICD-10-CM | POA: Diagnosis not present

## 2021-06-29 DIAGNOSIS — I10 Essential (primary) hypertension: Secondary | ICD-10-CM | POA: Diagnosis not present

## 2021-06-29 DIAGNOSIS — I351 Nonrheumatic aortic (valve) insufficiency: Secondary | ICD-10-CM | POA: Diagnosis not present

## 2021-06-29 DIAGNOSIS — I2510 Atherosclerotic heart disease of native coronary artery without angina pectoris: Secondary | ICD-10-CM | POA: Diagnosis not present

## 2021-06-29 DIAGNOSIS — I350 Nonrheumatic aortic (valve) stenosis: Secondary | ICD-10-CM | POA: Diagnosis not present

## 2021-06-29 DIAGNOSIS — I739 Peripheral vascular disease, unspecified: Secondary | ICD-10-CM | POA: Diagnosis not present

## 2021-06-30 DIAGNOSIS — I739 Peripheral vascular disease, unspecified: Secondary | ICD-10-CM | POA: Diagnosis not present

## 2021-06-30 DIAGNOSIS — E785 Hyperlipidemia, unspecified: Secondary | ICD-10-CM | POA: Diagnosis not present

## 2021-06-30 DIAGNOSIS — I350 Nonrheumatic aortic (valve) stenosis: Secondary | ICD-10-CM | POA: Diagnosis not present

## 2021-06-30 DIAGNOSIS — I351 Nonrheumatic aortic (valve) insufficiency: Secondary | ICD-10-CM | POA: Diagnosis not present

## 2021-06-30 DIAGNOSIS — I2510 Atherosclerotic heart disease of native coronary artery without angina pectoris: Secondary | ICD-10-CM | POA: Diagnosis not present

## 2021-06-30 DIAGNOSIS — I10 Essential (primary) hypertension: Secondary | ICD-10-CM | POA: Diagnosis not present

## 2021-07-01 DIAGNOSIS — I739 Peripheral vascular disease, unspecified: Secondary | ICD-10-CM | POA: Diagnosis not present

## 2021-07-01 DIAGNOSIS — I10 Essential (primary) hypertension: Secondary | ICD-10-CM | POA: Diagnosis not present

## 2021-07-01 DIAGNOSIS — I351 Nonrheumatic aortic (valve) insufficiency: Secondary | ICD-10-CM | POA: Diagnosis not present

## 2021-07-01 DIAGNOSIS — E785 Hyperlipidemia, unspecified: Secondary | ICD-10-CM | POA: Diagnosis not present

## 2021-07-01 DIAGNOSIS — I350 Nonrheumatic aortic (valve) stenosis: Secondary | ICD-10-CM | POA: Diagnosis not present

## 2021-07-01 DIAGNOSIS — I2510 Atherosclerotic heart disease of native coronary artery without angina pectoris: Secondary | ICD-10-CM | POA: Diagnosis not present

## 2021-07-03 DIAGNOSIS — I739 Peripheral vascular disease, unspecified: Secondary | ICD-10-CM | POA: Diagnosis not present

## 2021-07-03 DIAGNOSIS — I350 Nonrheumatic aortic (valve) stenosis: Secondary | ICD-10-CM | POA: Diagnosis not present

## 2021-07-03 DIAGNOSIS — I10 Essential (primary) hypertension: Secondary | ICD-10-CM | POA: Diagnosis not present

## 2021-07-03 DIAGNOSIS — I351 Nonrheumatic aortic (valve) insufficiency: Secondary | ICD-10-CM | POA: Diagnosis not present

## 2021-07-03 DIAGNOSIS — E785 Hyperlipidemia, unspecified: Secondary | ICD-10-CM | POA: Diagnosis not present

## 2021-07-03 DIAGNOSIS — I2510 Atherosclerotic heart disease of native coronary artery without angina pectoris: Secondary | ICD-10-CM | POA: Diagnosis not present

## 2021-07-05 DIAGNOSIS — I2510 Atherosclerotic heart disease of native coronary artery without angina pectoris: Secondary | ICD-10-CM | POA: Diagnosis not present

## 2021-07-05 DIAGNOSIS — E785 Hyperlipidemia, unspecified: Secondary | ICD-10-CM | POA: Diagnosis not present

## 2021-07-05 DIAGNOSIS — I351 Nonrheumatic aortic (valve) insufficiency: Secondary | ICD-10-CM | POA: Diagnosis not present

## 2021-07-05 DIAGNOSIS — I350 Nonrheumatic aortic (valve) stenosis: Secondary | ICD-10-CM | POA: Diagnosis not present

## 2021-07-05 DIAGNOSIS — I739 Peripheral vascular disease, unspecified: Secondary | ICD-10-CM | POA: Diagnosis not present

## 2021-07-05 DIAGNOSIS — I10 Essential (primary) hypertension: Secondary | ICD-10-CM | POA: Diagnosis not present

## 2021-07-06 DIAGNOSIS — I739 Peripheral vascular disease, unspecified: Secondary | ICD-10-CM | POA: Diagnosis not present

## 2021-07-06 DIAGNOSIS — E785 Hyperlipidemia, unspecified: Secondary | ICD-10-CM | POA: Diagnosis not present

## 2021-07-06 DIAGNOSIS — I351 Nonrheumatic aortic (valve) insufficiency: Secondary | ICD-10-CM | POA: Diagnosis not present

## 2021-07-06 DIAGNOSIS — I10 Essential (primary) hypertension: Secondary | ICD-10-CM | POA: Diagnosis not present

## 2021-07-06 DIAGNOSIS — I2510 Atherosclerotic heart disease of native coronary artery without angina pectoris: Secondary | ICD-10-CM | POA: Diagnosis not present

## 2021-07-06 DIAGNOSIS — I350 Nonrheumatic aortic (valve) stenosis: Secondary | ICD-10-CM | POA: Diagnosis not present

## 2021-07-07 DIAGNOSIS — I2510 Atherosclerotic heart disease of native coronary artery without angina pectoris: Secondary | ICD-10-CM | POA: Diagnosis not present

## 2021-07-07 DIAGNOSIS — E785 Hyperlipidemia, unspecified: Secondary | ICD-10-CM | POA: Diagnosis not present

## 2021-07-07 DIAGNOSIS — I350 Nonrheumatic aortic (valve) stenosis: Secondary | ICD-10-CM | POA: Diagnosis not present

## 2021-07-07 DIAGNOSIS — I351 Nonrheumatic aortic (valve) insufficiency: Secondary | ICD-10-CM | POA: Diagnosis not present

## 2021-07-07 DIAGNOSIS — I739 Peripheral vascular disease, unspecified: Secondary | ICD-10-CM | POA: Diagnosis not present

## 2021-07-07 DIAGNOSIS — I10 Essential (primary) hypertension: Secondary | ICD-10-CM | POA: Diagnosis not present

## 2021-07-10 DIAGNOSIS — I351 Nonrheumatic aortic (valve) insufficiency: Secondary | ICD-10-CM | POA: Diagnosis not present

## 2021-07-10 DIAGNOSIS — I350 Nonrheumatic aortic (valve) stenosis: Secondary | ICD-10-CM | POA: Diagnosis not present

## 2021-07-10 DIAGNOSIS — I739 Peripheral vascular disease, unspecified: Secondary | ICD-10-CM | POA: Diagnosis not present

## 2021-07-10 DIAGNOSIS — I2510 Atherosclerotic heart disease of native coronary artery without angina pectoris: Secondary | ICD-10-CM | POA: Diagnosis not present

## 2021-07-10 DIAGNOSIS — I10 Essential (primary) hypertension: Secondary | ICD-10-CM | POA: Diagnosis not present

## 2021-07-10 DIAGNOSIS — E785 Hyperlipidemia, unspecified: Secondary | ICD-10-CM | POA: Diagnosis not present

## 2021-07-12 DIAGNOSIS — I350 Nonrheumatic aortic (valve) stenosis: Secondary | ICD-10-CM | POA: Diagnosis not present

## 2021-07-12 DIAGNOSIS — I10 Essential (primary) hypertension: Secondary | ICD-10-CM | POA: Diagnosis not present

## 2021-07-12 DIAGNOSIS — E785 Hyperlipidemia, unspecified: Secondary | ICD-10-CM | POA: Diagnosis not present

## 2021-07-12 DIAGNOSIS — I739 Peripheral vascular disease, unspecified: Secondary | ICD-10-CM | POA: Diagnosis not present

## 2021-07-12 DIAGNOSIS — I351 Nonrheumatic aortic (valve) insufficiency: Secondary | ICD-10-CM | POA: Diagnosis not present

## 2021-07-12 DIAGNOSIS — I2510 Atherosclerotic heart disease of native coronary artery without angina pectoris: Secondary | ICD-10-CM | POA: Diagnosis not present

## 2021-07-13 DIAGNOSIS — I351 Nonrheumatic aortic (valve) insufficiency: Secondary | ICD-10-CM | POA: Diagnosis not present

## 2021-07-13 DIAGNOSIS — E785 Hyperlipidemia, unspecified: Secondary | ICD-10-CM | POA: Diagnosis not present

## 2021-07-13 DIAGNOSIS — I2510 Atherosclerotic heart disease of native coronary artery without angina pectoris: Secondary | ICD-10-CM | POA: Diagnosis not present

## 2021-07-13 DIAGNOSIS — I10 Essential (primary) hypertension: Secondary | ICD-10-CM | POA: Diagnosis not present

## 2021-07-13 DIAGNOSIS — I350 Nonrheumatic aortic (valve) stenosis: Secondary | ICD-10-CM | POA: Diagnosis not present

## 2021-07-13 DIAGNOSIS — I739 Peripheral vascular disease, unspecified: Secondary | ICD-10-CM | POA: Diagnosis not present

## 2021-07-14 DIAGNOSIS — I10 Essential (primary) hypertension: Secondary | ICD-10-CM | POA: Diagnosis not present

## 2021-07-14 DIAGNOSIS — I2510 Atherosclerotic heart disease of native coronary artery without angina pectoris: Secondary | ICD-10-CM | POA: Diagnosis not present

## 2021-07-14 DIAGNOSIS — I739 Peripheral vascular disease, unspecified: Secondary | ICD-10-CM | POA: Diagnosis not present

## 2021-07-14 DIAGNOSIS — I350 Nonrheumatic aortic (valve) stenosis: Secondary | ICD-10-CM | POA: Diagnosis not present

## 2021-07-14 DIAGNOSIS — I351 Nonrheumatic aortic (valve) insufficiency: Secondary | ICD-10-CM | POA: Diagnosis not present

## 2021-07-14 DIAGNOSIS — E785 Hyperlipidemia, unspecified: Secondary | ICD-10-CM | POA: Diagnosis not present

## 2021-07-15 DIAGNOSIS — D649 Anemia, unspecified: Secondary | ICD-10-CM | POA: Diagnosis not present

## 2021-07-15 DIAGNOSIS — I350 Nonrheumatic aortic (valve) stenosis: Secondary | ICD-10-CM | POA: Diagnosis not present

## 2021-07-15 DIAGNOSIS — I2510 Atherosclerotic heart disease of native coronary artery without angina pectoris: Secondary | ICD-10-CM | POA: Diagnosis not present

## 2021-07-15 DIAGNOSIS — E785 Hyperlipidemia, unspecified: Secondary | ICD-10-CM | POA: Diagnosis not present

## 2021-07-15 DIAGNOSIS — I10 Essential (primary) hypertension: Secondary | ICD-10-CM | POA: Diagnosis not present

## 2021-07-15 DIAGNOSIS — M1990 Unspecified osteoarthritis, unspecified site: Secondary | ICD-10-CM | POA: Diagnosis not present

## 2021-07-15 DIAGNOSIS — I351 Nonrheumatic aortic (valve) insufficiency: Secondary | ICD-10-CM | POA: Diagnosis not present

## 2021-07-15 DIAGNOSIS — F419 Anxiety disorder, unspecified: Secondary | ICD-10-CM | POA: Diagnosis not present

## 2021-07-15 DIAGNOSIS — F0390 Unspecified dementia without behavioral disturbance: Secondary | ICD-10-CM | POA: Diagnosis not present

## 2021-07-15 DIAGNOSIS — K219 Gastro-esophageal reflux disease without esophagitis: Secondary | ICD-10-CM | POA: Diagnosis not present

## 2021-07-15 DIAGNOSIS — I739 Peripheral vascular disease, unspecified: Secondary | ICD-10-CM | POA: Diagnosis not present

## 2021-07-15 DIAGNOSIS — H409 Unspecified glaucoma: Secondary | ICD-10-CM | POA: Diagnosis not present

## 2021-07-17 DIAGNOSIS — I10 Essential (primary) hypertension: Secondary | ICD-10-CM | POA: Diagnosis not present

## 2021-07-17 DIAGNOSIS — E785 Hyperlipidemia, unspecified: Secondary | ICD-10-CM | POA: Diagnosis not present

## 2021-07-17 DIAGNOSIS — I351 Nonrheumatic aortic (valve) insufficiency: Secondary | ICD-10-CM | POA: Diagnosis not present

## 2021-07-17 DIAGNOSIS — I739 Peripheral vascular disease, unspecified: Secondary | ICD-10-CM | POA: Diagnosis not present

## 2021-07-17 DIAGNOSIS — I2510 Atherosclerotic heart disease of native coronary artery without angina pectoris: Secondary | ICD-10-CM | POA: Diagnosis not present

## 2021-07-17 DIAGNOSIS — I350 Nonrheumatic aortic (valve) stenosis: Secondary | ICD-10-CM | POA: Diagnosis not present

## 2021-07-18 DIAGNOSIS — I739 Peripheral vascular disease, unspecified: Secondary | ICD-10-CM | POA: Diagnosis not present

## 2021-07-18 DIAGNOSIS — I10 Essential (primary) hypertension: Secondary | ICD-10-CM | POA: Diagnosis not present

## 2021-07-18 DIAGNOSIS — E785 Hyperlipidemia, unspecified: Secondary | ICD-10-CM | POA: Diagnosis not present

## 2021-07-18 DIAGNOSIS — I350 Nonrheumatic aortic (valve) stenosis: Secondary | ICD-10-CM | POA: Diagnosis not present

## 2021-07-18 DIAGNOSIS — I2510 Atherosclerotic heart disease of native coronary artery without angina pectoris: Secondary | ICD-10-CM | POA: Diagnosis not present

## 2021-07-18 DIAGNOSIS — I351 Nonrheumatic aortic (valve) insufficiency: Secondary | ICD-10-CM | POA: Diagnosis not present

## 2021-07-19 DIAGNOSIS — I10 Essential (primary) hypertension: Secondary | ICD-10-CM | POA: Diagnosis not present

## 2021-07-19 DIAGNOSIS — I739 Peripheral vascular disease, unspecified: Secondary | ICD-10-CM | POA: Diagnosis not present

## 2021-07-19 DIAGNOSIS — I350 Nonrheumatic aortic (valve) stenosis: Secondary | ICD-10-CM | POA: Diagnosis not present

## 2021-07-19 DIAGNOSIS — E785 Hyperlipidemia, unspecified: Secondary | ICD-10-CM | POA: Diagnosis not present

## 2021-07-19 DIAGNOSIS — I2510 Atherosclerotic heart disease of native coronary artery without angina pectoris: Secondary | ICD-10-CM | POA: Diagnosis not present

## 2021-07-19 DIAGNOSIS — I351 Nonrheumatic aortic (valve) insufficiency: Secondary | ICD-10-CM | POA: Diagnosis not present

## 2021-07-20 DIAGNOSIS — I739 Peripheral vascular disease, unspecified: Secondary | ICD-10-CM | POA: Diagnosis not present

## 2021-07-20 DIAGNOSIS — I350 Nonrheumatic aortic (valve) stenosis: Secondary | ICD-10-CM | POA: Diagnosis not present

## 2021-07-20 DIAGNOSIS — I351 Nonrheumatic aortic (valve) insufficiency: Secondary | ICD-10-CM | POA: Diagnosis not present

## 2021-07-20 DIAGNOSIS — I10 Essential (primary) hypertension: Secondary | ICD-10-CM | POA: Diagnosis not present

## 2021-07-20 DIAGNOSIS — E785 Hyperlipidemia, unspecified: Secondary | ICD-10-CM | POA: Diagnosis not present

## 2021-07-20 DIAGNOSIS — I2510 Atherosclerotic heart disease of native coronary artery without angina pectoris: Secondary | ICD-10-CM | POA: Diagnosis not present

## 2021-07-21 DIAGNOSIS — I351 Nonrheumatic aortic (valve) insufficiency: Secondary | ICD-10-CM | POA: Diagnosis not present

## 2021-07-21 DIAGNOSIS — I350 Nonrheumatic aortic (valve) stenosis: Secondary | ICD-10-CM | POA: Diagnosis not present

## 2021-07-21 DIAGNOSIS — I10 Essential (primary) hypertension: Secondary | ICD-10-CM | POA: Diagnosis not present

## 2021-07-21 DIAGNOSIS — I2510 Atherosclerotic heart disease of native coronary artery without angina pectoris: Secondary | ICD-10-CM | POA: Diagnosis not present

## 2021-07-21 DIAGNOSIS — E785 Hyperlipidemia, unspecified: Secondary | ICD-10-CM | POA: Diagnosis not present

## 2021-07-21 DIAGNOSIS — I739 Peripheral vascular disease, unspecified: Secondary | ICD-10-CM | POA: Diagnosis not present

## 2021-07-24 DIAGNOSIS — E785 Hyperlipidemia, unspecified: Secondary | ICD-10-CM | POA: Diagnosis not present

## 2021-07-24 DIAGNOSIS — I739 Peripheral vascular disease, unspecified: Secondary | ICD-10-CM | POA: Diagnosis not present

## 2021-07-24 DIAGNOSIS — I10 Essential (primary) hypertension: Secondary | ICD-10-CM | POA: Diagnosis not present

## 2021-07-24 DIAGNOSIS — I351 Nonrheumatic aortic (valve) insufficiency: Secondary | ICD-10-CM | POA: Diagnosis not present

## 2021-07-24 DIAGNOSIS — I350 Nonrheumatic aortic (valve) stenosis: Secondary | ICD-10-CM | POA: Diagnosis not present

## 2021-07-24 DIAGNOSIS — I2510 Atherosclerotic heart disease of native coronary artery without angina pectoris: Secondary | ICD-10-CM | POA: Diagnosis not present

## 2021-07-26 DIAGNOSIS — I2510 Atherosclerotic heart disease of native coronary artery without angina pectoris: Secondary | ICD-10-CM | POA: Diagnosis not present

## 2021-07-26 DIAGNOSIS — I10 Essential (primary) hypertension: Secondary | ICD-10-CM | POA: Diagnosis not present

## 2021-07-26 DIAGNOSIS — E785 Hyperlipidemia, unspecified: Secondary | ICD-10-CM | POA: Diagnosis not present

## 2021-07-26 DIAGNOSIS — I350 Nonrheumatic aortic (valve) stenosis: Secondary | ICD-10-CM | POA: Diagnosis not present

## 2021-07-26 DIAGNOSIS — I351 Nonrheumatic aortic (valve) insufficiency: Secondary | ICD-10-CM | POA: Diagnosis not present

## 2021-07-26 DIAGNOSIS — I739 Peripheral vascular disease, unspecified: Secondary | ICD-10-CM | POA: Diagnosis not present

## 2021-07-27 DIAGNOSIS — I739 Peripheral vascular disease, unspecified: Secondary | ICD-10-CM | POA: Diagnosis not present

## 2021-07-27 DIAGNOSIS — I351 Nonrheumatic aortic (valve) insufficiency: Secondary | ICD-10-CM | POA: Diagnosis not present

## 2021-07-27 DIAGNOSIS — I350 Nonrheumatic aortic (valve) stenosis: Secondary | ICD-10-CM | POA: Diagnosis not present

## 2021-07-27 DIAGNOSIS — E785 Hyperlipidemia, unspecified: Secondary | ICD-10-CM | POA: Diagnosis not present

## 2021-07-27 DIAGNOSIS — I2510 Atherosclerotic heart disease of native coronary artery without angina pectoris: Secondary | ICD-10-CM | POA: Diagnosis not present

## 2021-07-27 DIAGNOSIS — I10 Essential (primary) hypertension: Secondary | ICD-10-CM | POA: Diagnosis not present

## 2021-07-28 DIAGNOSIS — I739 Peripheral vascular disease, unspecified: Secondary | ICD-10-CM | POA: Diagnosis not present

## 2021-07-28 DIAGNOSIS — I2510 Atherosclerotic heart disease of native coronary artery without angina pectoris: Secondary | ICD-10-CM | POA: Diagnosis not present

## 2021-07-28 DIAGNOSIS — E785 Hyperlipidemia, unspecified: Secondary | ICD-10-CM | POA: Diagnosis not present

## 2021-07-28 DIAGNOSIS — I351 Nonrheumatic aortic (valve) insufficiency: Secondary | ICD-10-CM | POA: Diagnosis not present

## 2021-07-28 DIAGNOSIS — I350 Nonrheumatic aortic (valve) stenosis: Secondary | ICD-10-CM | POA: Diagnosis not present

## 2021-07-28 DIAGNOSIS — I10 Essential (primary) hypertension: Secondary | ICD-10-CM | POA: Diagnosis not present

## 2021-07-31 DIAGNOSIS — I350 Nonrheumatic aortic (valve) stenosis: Secondary | ICD-10-CM | POA: Diagnosis not present

## 2021-07-31 DIAGNOSIS — I10 Essential (primary) hypertension: Secondary | ICD-10-CM | POA: Diagnosis not present

## 2021-07-31 DIAGNOSIS — I351 Nonrheumatic aortic (valve) insufficiency: Secondary | ICD-10-CM | POA: Diagnosis not present

## 2021-07-31 DIAGNOSIS — E785 Hyperlipidemia, unspecified: Secondary | ICD-10-CM | POA: Diagnosis not present

## 2021-07-31 DIAGNOSIS — I739 Peripheral vascular disease, unspecified: Secondary | ICD-10-CM | POA: Diagnosis not present

## 2021-07-31 DIAGNOSIS — I2510 Atherosclerotic heart disease of native coronary artery without angina pectoris: Secondary | ICD-10-CM | POA: Diagnosis not present

## 2021-08-02 DIAGNOSIS — E785 Hyperlipidemia, unspecified: Secondary | ICD-10-CM | POA: Diagnosis not present

## 2021-08-02 DIAGNOSIS — I351 Nonrheumatic aortic (valve) insufficiency: Secondary | ICD-10-CM | POA: Diagnosis not present

## 2021-08-02 DIAGNOSIS — I2510 Atherosclerotic heart disease of native coronary artery without angina pectoris: Secondary | ICD-10-CM | POA: Diagnosis not present

## 2021-08-02 DIAGNOSIS — I739 Peripheral vascular disease, unspecified: Secondary | ICD-10-CM | POA: Diagnosis not present

## 2021-08-02 DIAGNOSIS — I350 Nonrheumatic aortic (valve) stenosis: Secondary | ICD-10-CM | POA: Diagnosis not present

## 2021-08-02 DIAGNOSIS — I10 Essential (primary) hypertension: Secondary | ICD-10-CM | POA: Diagnosis not present

## 2021-08-03 DIAGNOSIS — I2510 Atherosclerotic heart disease of native coronary artery without angina pectoris: Secondary | ICD-10-CM | POA: Diagnosis not present

## 2021-08-03 DIAGNOSIS — E785 Hyperlipidemia, unspecified: Secondary | ICD-10-CM | POA: Diagnosis not present

## 2021-08-03 DIAGNOSIS — I351 Nonrheumatic aortic (valve) insufficiency: Secondary | ICD-10-CM | POA: Diagnosis not present

## 2021-08-03 DIAGNOSIS — I10 Essential (primary) hypertension: Secondary | ICD-10-CM | POA: Diagnosis not present

## 2021-08-03 DIAGNOSIS — I739 Peripheral vascular disease, unspecified: Secondary | ICD-10-CM | POA: Diagnosis not present

## 2021-08-03 DIAGNOSIS — I350 Nonrheumatic aortic (valve) stenosis: Secondary | ICD-10-CM | POA: Diagnosis not present

## 2021-08-04 DIAGNOSIS — I351 Nonrheumatic aortic (valve) insufficiency: Secondary | ICD-10-CM | POA: Diagnosis not present

## 2021-08-04 DIAGNOSIS — I350 Nonrheumatic aortic (valve) stenosis: Secondary | ICD-10-CM | POA: Diagnosis not present

## 2021-08-04 DIAGNOSIS — I739 Peripheral vascular disease, unspecified: Secondary | ICD-10-CM | POA: Diagnosis not present

## 2021-08-04 DIAGNOSIS — E785 Hyperlipidemia, unspecified: Secondary | ICD-10-CM | POA: Diagnosis not present

## 2021-08-04 DIAGNOSIS — I2510 Atherosclerotic heart disease of native coronary artery without angina pectoris: Secondary | ICD-10-CM | POA: Diagnosis not present

## 2021-08-04 DIAGNOSIS — I10 Essential (primary) hypertension: Secondary | ICD-10-CM | POA: Diagnosis not present

## 2021-08-07 DIAGNOSIS — I10 Essential (primary) hypertension: Secondary | ICD-10-CM | POA: Diagnosis not present

## 2021-08-07 DIAGNOSIS — I739 Peripheral vascular disease, unspecified: Secondary | ICD-10-CM | POA: Diagnosis not present

## 2021-08-07 DIAGNOSIS — I2510 Atherosclerotic heart disease of native coronary artery without angina pectoris: Secondary | ICD-10-CM | POA: Diagnosis not present

## 2021-08-07 DIAGNOSIS — E785 Hyperlipidemia, unspecified: Secondary | ICD-10-CM | POA: Diagnosis not present

## 2021-08-07 DIAGNOSIS — I350 Nonrheumatic aortic (valve) stenosis: Secondary | ICD-10-CM | POA: Diagnosis not present

## 2021-08-07 DIAGNOSIS — I351 Nonrheumatic aortic (valve) insufficiency: Secondary | ICD-10-CM | POA: Diagnosis not present

## 2021-08-09 DIAGNOSIS — I351 Nonrheumatic aortic (valve) insufficiency: Secondary | ICD-10-CM | POA: Diagnosis not present

## 2021-08-09 DIAGNOSIS — I10 Essential (primary) hypertension: Secondary | ICD-10-CM | POA: Diagnosis not present

## 2021-08-09 DIAGNOSIS — E785 Hyperlipidemia, unspecified: Secondary | ICD-10-CM | POA: Diagnosis not present

## 2021-08-09 DIAGNOSIS — I2510 Atherosclerotic heart disease of native coronary artery without angina pectoris: Secondary | ICD-10-CM | POA: Diagnosis not present

## 2021-08-09 DIAGNOSIS — I739 Peripheral vascular disease, unspecified: Secondary | ICD-10-CM | POA: Diagnosis not present

## 2021-08-09 DIAGNOSIS — I350 Nonrheumatic aortic (valve) stenosis: Secondary | ICD-10-CM | POA: Diagnosis not present

## 2021-08-10 DIAGNOSIS — I739 Peripheral vascular disease, unspecified: Secondary | ICD-10-CM | POA: Diagnosis not present

## 2021-08-10 DIAGNOSIS — I10 Essential (primary) hypertension: Secondary | ICD-10-CM | POA: Diagnosis not present

## 2021-08-10 DIAGNOSIS — I351 Nonrheumatic aortic (valve) insufficiency: Secondary | ICD-10-CM | POA: Diagnosis not present

## 2021-08-10 DIAGNOSIS — E785 Hyperlipidemia, unspecified: Secondary | ICD-10-CM | POA: Diagnosis not present

## 2021-08-10 DIAGNOSIS — I350 Nonrheumatic aortic (valve) stenosis: Secondary | ICD-10-CM | POA: Diagnosis not present

## 2021-08-10 DIAGNOSIS — I2510 Atherosclerotic heart disease of native coronary artery without angina pectoris: Secondary | ICD-10-CM | POA: Diagnosis not present

## 2021-08-11 DIAGNOSIS — E785 Hyperlipidemia, unspecified: Secondary | ICD-10-CM | POA: Diagnosis not present

## 2021-08-11 DIAGNOSIS — I10 Essential (primary) hypertension: Secondary | ICD-10-CM | POA: Diagnosis not present

## 2021-08-11 DIAGNOSIS — I2510 Atherosclerotic heart disease of native coronary artery without angina pectoris: Secondary | ICD-10-CM | POA: Diagnosis not present

## 2021-08-11 DIAGNOSIS — I350 Nonrheumatic aortic (valve) stenosis: Secondary | ICD-10-CM | POA: Diagnosis not present

## 2021-08-11 DIAGNOSIS — I351 Nonrheumatic aortic (valve) insufficiency: Secondary | ICD-10-CM | POA: Diagnosis not present

## 2021-08-11 DIAGNOSIS — I739 Peripheral vascular disease, unspecified: Secondary | ICD-10-CM | POA: Diagnosis not present

## 2021-08-14 ENCOUNTER — Ambulatory Visit (INDEPENDENT_AMBULATORY_CARE_PROVIDER_SITE_OTHER): Payer: Medicare Other | Admitting: Podiatrist

## 2021-08-14 ENCOUNTER — Encounter: Payer: Self-pay | Admitting: Podiatrist

## 2021-08-14 DIAGNOSIS — M79674 Pain in right toe(s): Secondary | ICD-10-CM | POA: Diagnosis not present

## 2021-08-14 DIAGNOSIS — I350 Nonrheumatic aortic (valve) stenosis: Secondary | ICD-10-CM | POA: Diagnosis not present

## 2021-08-14 DIAGNOSIS — D649 Anemia, unspecified: Secondary | ICD-10-CM | POA: Diagnosis not present

## 2021-08-14 DIAGNOSIS — I351 Nonrheumatic aortic (valve) insufficiency: Secondary | ICD-10-CM | POA: Diagnosis not present

## 2021-08-14 DIAGNOSIS — M1990 Unspecified osteoarthritis, unspecified site: Secondary | ICD-10-CM | POA: Diagnosis not present

## 2021-08-14 DIAGNOSIS — K219 Gastro-esophageal reflux disease without esophagitis: Secondary | ICD-10-CM | POA: Diagnosis not present

## 2021-08-14 DIAGNOSIS — M79675 Pain in left toe(s): Secondary | ICD-10-CM | POA: Diagnosis not present

## 2021-08-14 DIAGNOSIS — B351 Tinea unguium: Secondary | ICD-10-CM | POA: Diagnosis not present

## 2021-08-14 DIAGNOSIS — I739 Peripheral vascular disease, unspecified: Secondary | ICD-10-CM | POA: Diagnosis not present

## 2021-08-14 DIAGNOSIS — L97512 Non-pressure chronic ulcer of other part of right foot with fat layer exposed: Secondary | ICD-10-CM | POA: Diagnosis not present

## 2021-08-14 DIAGNOSIS — F419 Anxiety disorder, unspecified: Secondary | ICD-10-CM | POA: Diagnosis not present

## 2021-08-14 DIAGNOSIS — I10 Essential (primary) hypertension: Secondary | ICD-10-CM | POA: Diagnosis not present

## 2021-08-14 DIAGNOSIS — H409 Unspecified glaucoma: Secondary | ICD-10-CM | POA: Diagnosis not present

## 2021-08-14 DIAGNOSIS — I2510 Atherosclerotic heart disease of native coronary artery without angina pectoris: Secondary | ICD-10-CM | POA: Diagnosis not present

## 2021-08-14 DIAGNOSIS — F0390 Unspecified dementia without behavioral disturbance: Secondary | ICD-10-CM | POA: Diagnosis not present

## 2021-08-14 DIAGNOSIS — E785 Hyperlipidemia, unspecified: Secondary | ICD-10-CM | POA: Diagnosis not present

## 2021-08-14 MED ORDER — SANTYL 250 UNIT/GM EX OINT: 1.0000 "application " | TOPICAL_OINTMENT | Freq: Every day | CUTANEOUS | 0 refills | Status: AC

## 2021-08-14 NOTE — Progress Notes (Signed)
?Chief Complaint  ?Patient presents with  ? Nail Problem  ?  ? ?HPI: Patient is 88 y.o. male who presents today with his caregiver for a wound on the right second toe as well as for long toenails which are thick and he is unable to trim.  He has had the darkness to the toe for several weeks and his caregiver states they were concerned it would fall off but it has since improved. He has know decreased blood flow and his last vascular test was a couple of years ago.   ? ?Patient Active Problem List  ? Diagnosis Date Noted  ? Decreased cardiac output 12/12/2020  ? Controlled type 2 diabetes mellitus with stage 1 chronic kidney disease, without long-term current use of insulin ([place]) 10/06/2020  ? GERD (gastroesophageal reflux disease)   ? Chronic anemia 04/19/2020  ? Acute bronchitis with bronchospasm 12/22/2019  ? Urinary incontinence 11/18/2019  ? Failure to thrive in adult 08/04/2019  ? Trochanteric bursitis of right hip 04/28/2019  ? Cellulitis 04/28/2019  ? Dementia ([place]) 05/12/2018  ? Auditory hallucination 05/12/2018  ? Right leg swelling 09/05/2017  ? Epistaxis 01/16/2017  ? Tinea pedis 06/29/2016  ? Vertigo 02/13/2016  ? MGUS (monoclonal gammopathy of unknown significance) 01/31/2016  ? ARF (acute renal failure) ([place]) 12/29/2015  ? Acute kidney injury ([place]) 12/23/2015  ? Diarrhea 12/21/2015  ? Asymptomatic gallstones 08/31/2015  ? Weight loss 08/17/2015  ? Edema 08/17/2015  ? Serous otitis media 05/20/2015  ? Sebaceous cyst of left axilla 10/15/2014  ? Peripheral arterial disease ([place]) 12/29/2013  ? Cerumen impaction 12/09/2013  ? Pain in lower limb 08/17/2013  ? Dry skin 06/10/2013  ? Aortic valve stenosis 12/11/2012  ? Allergic rhinitis 08/19/2012  ? Onychomycosis 08/12/2012  ? Pain in joint, ankle and foot 08/12/2012  ? CTS (carpal tunnel syndrome) 05/13/2012  ? Chronic venous insufficiency 05/13/2012  ? Right bundle branch block 12/10/2011  ? CAP (community acquired pneumonia)   ? Cough 03/31/2010  ?  TOBACCO USE, QUIT 04/01/2009  ? CORONARY ATHEROSCLEROSIS NATIVE CORONARY ARTERY 03/25/2009  ? CAROTID ARTERY STENOSIS, WITHOUT INFARCTION 02/11/2009  ? Abdominal pain, epigastric 11/17/2008  ? PLEURAL EFFUSION, RIGHT 02/03/2008  ? DYSPNEA 02/03/2008  ? FOOT PAIN 09/22/2007  ? ABNORMAL LIVER FUNCTION TESTS 09/22/2007  ? POSTHERPETIC NEURALGIA 07/17/2007  ? LOW BACK PAIN 07/17/2007  ? COLONIC POLYPS, HX OF 07/17/2007  ? SHINGLES 07/03/2007  ? Dyslipidemia 05/01/2007  ? Benign hypertensive heart and renal disease with renal failure 05/01/2007  ? MYALGIA 05/01/2007  ? ABNORMAL GLUCOSE NEC 05/01/2007  ? ? ?Current Outpatient Medications on File Prior to Visit  ?Medication Sig Dispense Refill  ? acetaminophen (TYLENOL) 500 MG tablet Take 1 tablet (500 mg total) by mouth every 6 (six) hours as needed. 30 tablet 0  ? ALLERGY 25 MG tablet Take 25 mg by mouth every 4 (four) hours as needed for allergies.    ? Cholecalciferol (VITAMIN D3) 50 MCG (2000 UT) capsule Take 1 capsule (2,000 Units total) by mouth daily. 100 capsule 3  ? [facility] MELATONIN 3 MG TABS tablet TAKE 1 TABLET (3 MG TOTAL) BY MOUTH AT BEDTIME AS NEEDED. 90 tablet 1  ? [facility] SENNA 8.6 MG tablet Take 2 tablets by mouth daily.    ? diclofenac Sodium (VOLTAREN) 1 % GEL Apply 2 g topically 4 (four) times daily. 100 g 0  ? ferrous sulfate 325 (65 FE) MG tablet Take 650 mg by mouth daily with breakfast.    ?  furosemide (LASIX) 20 MG tablet TAKE 1 TABLET BY MOUTH EVERY DAY 30 tablet 12  ? gabapentin (NEURONTIN) 100 MG capsule Take 100-300 mg by mouth as needed (nerve pain).    ? LORazepam (ATIVAN) 0.5 MG tablet Take 0.5 mg by mouth every 4 (four) hours as needed for anxiety.    ? PHENobarbital (LUMINAL) 64.8 MG tablet SMARTSIG:1 Tablet(s) By Mouth Every Evening    ? TRAVATAN Z 0.004 % SOLN ophthalmic solution Place 1 drop into both eyes daily.    ? ?No current facility-administered medications on file prior to visit.  ? ? ?Allergies  ?Allergen Reactions  ? Atorvastatin  Other (See Comments)  ?  Muscle aches ?Muscle aches ?Muscle aches  ? Benazepril Hcl Cough  ? Cosopt [Dorzolamide Hcl-Timolol Mal] Other (See Comments)  ?  Eyes water  ? Ezetimibe-Simvastatin Other (See Comments)  ?  REACTION: aches ?REACTION: aches ?REACTION: aches  ? Aricept [Donepezil Hcl]   ?  hallucinations  ? Norvasc [Amlodipine Besylate]   ?  swelling  ? Zoloft [Sertraline]   ? ? ?Vascular Examination: ?Vascular status decreased with non palpable pedal pulses. CFT delayed b/l. No edema. No pain with calf compression b/l. Skin temperature gradient WNL b/l.  ?  ?Neurological Examination: ?Sensation grossly diminshed b/l with 10 gram monofilament at 0/5 sites bilateral. Vibratory sensation decreased b/l.  ?  ?Dermatological Examination: ? ?Full thickness ulceration medial right second toe is present measures 0.4cm x 0.4 cm x 0.3cm depth.  Fibrogranular base is noted. No rednes, no swelling is seen.  Darkening of the toe is noted but his caregiver states it is much improved.  ?Remainder of Pedal skin with normal turgor, texture and tone b/l. Toenails 1-5 Left and 1,3,4,5 Right, are elongated, discolored, dystrophic, thickened, and crumbly with subungual debris and tenderness to dorsal palpation.   ?Musculoskeletal Examination: ?Muscle strength 5/5 to b/l LE. ? ? ? ?ASSESSMENT: ?  ICD-10-CM   ?1. Skin ulcer of second toe, right, with fat layer exposed ([place])  L97.512   ?  ?2. Pain due to onychomycosis of toenails of both feet  B35.1   ? P59.163   ? W46.659   ?  ? ? ? ?PLAN: ?Discussed exam findings with Aren and his caregiver.  I discussed that he does have an ulceration on his right second toe and with the decreased blood flow I would recommended repeat vascular studies.  His caregiver would like to hold off for now.  At todays visit I debrided the ulcer and and applied iodosorb to the base of the ulcer and a dressing.  I called in Santyl and gave instructions for its use. ? ?I also trimmed his toenails today in  length and thickness with a nail nipper and power burr without complication.  He will return in 2 weeks for a recheck of the toe and will call sooner if any questions or concerns arise.  ? ?

## 2021-08-14 NOTE — Patient Instructions (Signed)
Toe ulcer instructions ? ? ?1. I have called in Santyl to your pharmacy-- if it is out of pocket or too expensive.Marland Kitchen go onto [employer] and order IODOSORB Gel- 10gm ($20)-  apply as instructed below.  ? ?2. CLEANSE ULCER WITH SALINE. ? ?3. DAB DRY WITH GAUZE SPONGE. ? ?4. APPLY A LIGHT AMOUNT OF santyl (or iodosorb) TO BASE OF ULCER. ? ?5. APPLY bandaid to cover ? ?6. Spread apart the second and big toe to allow pressure relief on the ulcer  ? ?7. DO NOT WALK BAREFOOT!!! ? ?8.  IF YOU EXPERIENCE ANY FEVER, CHILLS, NIGHTSWEATS, NAUSEA OR VOMITING, ELEVATED OR LOW BLOOD SUGARS, REPORT TO EMERGENCY ROOM. ? ?9. IF YOU EXPERIENCE INCREASED REDNESS, PAIN, SWELLING, DISCOLORATION, ODOR, PUS, DRAINAGE OR WARMTH OF YOUR FOOT, REPORT TO EMERGENCY ROOM. ? ?

## 2021-08-16 DIAGNOSIS — I351 Nonrheumatic aortic (valve) insufficiency: Secondary | ICD-10-CM | POA: Diagnosis not present

## 2021-08-16 DIAGNOSIS — I2510 Atherosclerotic heart disease of native coronary artery without angina pectoris: Secondary | ICD-10-CM | POA: Diagnosis not present

## 2021-08-16 DIAGNOSIS — I10 Essential (primary) hypertension: Secondary | ICD-10-CM | POA: Diagnosis not present

## 2021-08-16 DIAGNOSIS — I350 Nonrheumatic aortic (valve) stenosis: Secondary | ICD-10-CM | POA: Diagnosis not present

## 2021-08-16 DIAGNOSIS — E785 Hyperlipidemia, unspecified: Secondary | ICD-10-CM | POA: Diagnosis not present

## 2021-08-16 DIAGNOSIS — I739 Peripheral vascular disease, unspecified: Secondary | ICD-10-CM | POA: Diagnosis not present

## 2021-08-17 DIAGNOSIS — I350 Nonrheumatic aortic (valve) stenosis: Secondary | ICD-10-CM | POA: Diagnosis not present

## 2021-08-17 DIAGNOSIS — I739 Peripheral vascular disease, unspecified: Secondary | ICD-10-CM | POA: Diagnosis not present

## 2021-08-17 DIAGNOSIS — I351 Nonrheumatic aortic (valve) insufficiency: Secondary | ICD-10-CM | POA: Diagnosis not present

## 2021-08-17 DIAGNOSIS — E785 Hyperlipidemia, unspecified: Secondary | ICD-10-CM | POA: Diagnosis not present

## 2021-08-17 DIAGNOSIS — I10 Essential (primary) hypertension: Secondary | ICD-10-CM | POA: Diagnosis not present

## 2021-08-17 DIAGNOSIS — I2510 Atherosclerotic heart disease of native coronary artery without angina pectoris: Secondary | ICD-10-CM | POA: Diagnosis not present

## 2021-08-18 DIAGNOSIS — I2510 Atherosclerotic heart disease of native coronary artery without angina pectoris: Secondary | ICD-10-CM | POA: Diagnosis not present

## 2021-08-18 DIAGNOSIS — I10 Essential (primary) hypertension: Secondary | ICD-10-CM | POA: Diagnosis not present

## 2021-08-18 DIAGNOSIS — I350 Nonrheumatic aortic (valve) stenosis: Secondary | ICD-10-CM | POA: Diagnosis not present

## 2021-08-18 DIAGNOSIS — I739 Peripheral vascular disease, unspecified: Secondary | ICD-10-CM | POA: Diagnosis not present

## 2021-08-18 DIAGNOSIS — I351 Nonrheumatic aortic (valve) insufficiency: Secondary | ICD-10-CM | POA: Diagnosis not present

## 2021-08-18 DIAGNOSIS — E785 Hyperlipidemia, unspecified: Secondary | ICD-10-CM | POA: Diagnosis not present

## 2021-08-21 DIAGNOSIS — I2510 Atherosclerotic heart disease of native coronary artery without angina pectoris: Secondary | ICD-10-CM | POA: Diagnosis not present

## 2021-08-21 DIAGNOSIS — E785 Hyperlipidemia, unspecified: Secondary | ICD-10-CM | POA: Diagnosis not present

## 2021-08-21 DIAGNOSIS — I10 Essential (primary) hypertension: Secondary | ICD-10-CM | POA: Diagnosis not present

## 2021-08-21 DIAGNOSIS — I351 Nonrheumatic aortic (valve) insufficiency: Secondary | ICD-10-CM | POA: Diagnosis not present

## 2021-08-21 DIAGNOSIS — I739 Peripheral vascular disease, unspecified: Secondary | ICD-10-CM | POA: Diagnosis not present

## 2021-08-21 DIAGNOSIS — I350 Nonrheumatic aortic (valve) stenosis: Secondary | ICD-10-CM | POA: Diagnosis not present

## 2021-08-23 DIAGNOSIS — I739 Peripheral vascular disease, unspecified: Secondary | ICD-10-CM | POA: Diagnosis not present

## 2021-08-23 DIAGNOSIS — I10 Essential (primary) hypertension: Secondary | ICD-10-CM | POA: Diagnosis not present

## 2021-08-23 DIAGNOSIS — I2510 Atherosclerotic heart disease of native coronary artery without angina pectoris: Secondary | ICD-10-CM | POA: Diagnosis not present

## 2021-08-23 DIAGNOSIS — I350 Nonrheumatic aortic (valve) stenosis: Secondary | ICD-10-CM | POA: Diagnosis not present

## 2021-08-23 DIAGNOSIS — E785 Hyperlipidemia, unspecified: Secondary | ICD-10-CM | POA: Diagnosis not present

## 2021-08-23 DIAGNOSIS — I351 Nonrheumatic aortic (valve) insufficiency: Secondary | ICD-10-CM | POA: Diagnosis not present

## 2021-08-24 ENCOUNTER — Other Ambulatory Visit: Payer: Self-pay | Admitting: Sports Medicine

## 2021-08-24 ENCOUNTER — Telehealth: Payer: Self-pay | Admitting: Podiatrist

## 2021-08-24 DIAGNOSIS — E785 Hyperlipidemia, unspecified: Secondary | ICD-10-CM | POA: Diagnosis not present

## 2021-08-24 DIAGNOSIS — I2510 Atherosclerotic heart disease of native coronary artery without angina pectoris: Secondary | ICD-10-CM | POA: Diagnosis not present

## 2021-08-24 DIAGNOSIS — I739 Peripheral vascular disease, unspecified: Secondary | ICD-10-CM | POA: Diagnosis not present

## 2021-08-24 DIAGNOSIS — I10 Essential (primary) hypertension: Secondary | ICD-10-CM | POA: Diagnosis not present

## 2021-08-24 DIAGNOSIS — I350 Nonrheumatic aortic (valve) stenosis: Secondary | ICD-10-CM | POA: Diagnosis not present

## 2021-08-24 DIAGNOSIS — I351 Nonrheumatic aortic (valve) insufficiency: Secondary | ICD-10-CM | POA: Diagnosis not present

## 2021-08-24 MED ORDER — CADEXOMER IODINE 0.9 % EX GEL: 1.0000 "application " | Freq: Every day | CUTANEOUS | 0 refills | Status: AC | PRN

## 2021-08-24 NOTE — Telephone Encounter (Signed)
Pt has been waiting on medication to be authorized by insurance. Pt's daughter is calling to see when this can be done if possible. She thinks its an expensive medication and that may be the reason its not being authorized.  ? ?collagenase (SANTYL) 250 UNIT/GM ointment ? ?[id number] [place], [place] - [address] ?[address], [place] ?Phone:  [phone] Fax:  [phone] ?DEA #:  [id number]? ?Please advise. ?

## 2021-08-24 NOTE — Telephone Encounter (Signed)
Called patient, no answer, left vmessage of Dr Leeanne Rio recommendations

## 2021-08-24 NOTE — Progress Notes (Signed)
Changed Santyl to Iodosorb since there is a delay with PA to see if his insurance will cover this instead or daughter can purchase this off of [employer] or [employer] ?

## 2021-08-25 DIAGNOSIS — I10 Essential (primary) hypertension: Secondary | ICD-10-CM | POA: Diagnosis not present

## 2021-08-25 DIAGNOSIS — I2510 Atherosclerotic heart disease of native coronary artery without angina pectoris: Secondary | ICD-10-CM | POA: Diagnosis not present

## 2021-08-25 DIAGNOSIS — I350 Nonrheumatic aortic (valve) stenosis: Secondary | ICD-10-CM | POA: Diagnosis not present

## 2021-08-25 DIAGNOSIS — E785 Hyperlipidemia, unspecified: Secondary | ICD-10-CM | POA: Diagnosis not present

## 2021-08-25 DIAGNOSIS — I351 Nonrheumatic aortic (valve) insufficiency: Secondary | ICD-10-CM | POA: Diagnosis not present

## 2021-08-25 DIAGNOSIS — I739 Peripheral vascular disease, unspecified: Secondary | ICD-10-CM | POA: Diagnosis not present

## 2021-08-28 DIAGNOSIS — I10 Essential (primary) hypertension: Secondary | ICD-10-CM | POA: Diagnosis not present

## 2021-08-28 DIAGNOSIS — I739 Peripheral vascular disease, unspecified: Secondary | ICD-10-CM | POA: Diagnosis not present

## 2021-08-28 DIAGNOSIS — I351 Nonrheumatic aortic (valve) insufficiency: Secondary | ICD-10-CM | POA: Diagnosis not present

## 2021-08-28 DIAGNOSIS — E785 Hyperlipidemia, unspecified: Secondary | ICD-10-CM | POA: Diagnosis not present

## 2021-08-28 DIAGNOSIS — I350 Nonrheumatic aortic (valve) stenosis: Secondary | ICD-10-CM | POA: Diagnosis not present

## 2021-08-28 DIAGNOSIS — I2510 Atherosclerotic heart disease of native coronary artery without angina pectoris: Secondary | ICD-10-CM | POA: Diagnosis not present

## 2021-08-30 DIAGNOSIS — I2510 Atherosclerotic heart disease of native coronary artery without angina pectoris: Secondary | ICD-10-CM | POA: Diagnosis not present

## 2021-08-30 DIAGNOSIS — I350 Nonrheumatic aortic (valve) stenosis: Secondary | ICD-10-CM | POA: Diagnosis not present

## 2021-08-30 DIAGNOSIS — I10 Essential (primary) hypertension: Secondary | ICD-10-CM | POA: Diagnosis not present

## 2021-08-30 DIAGNOSIS — I351 Nonrheumatic aortic (valve) insufficiency: Secondary | ICD-10-CM | POA: Diagnosis not present

## 2021-08-30 DIAGNOSIS — I739 Peripheral vascular disease, unspecified: Secondary | ICD-10-CM | POA: Diagnosis not present

## 2021-08-30 DIAGNOSIS — E785 Hyperlipidemia, unspecified: Secondary | ICD-10-CM | POA: Diagnosis not present

## 2021-08-31 DIAGNOSIS — I350 Nonrheumatic aortic (valve) stenosis: Secondary | ICD-10-CM | POA: Diagnosis not present

## 2021-08-31 DIAGNOSIS — E785 Hyperlipidemia, unspecified: Secondary | ICD-10-CM | POA: Diagnosis not present

## 2021-08-31 DIAGNOSIS — I739 Peripheral vascular disease, unspecified: Secondary | ICD-10-CM | POA: Diagnosis not present

## 2021-08-31 DIAGNOSIS — I10 Essential (primary) hypertension: Secondary | ICD-10-CM | POA: Diagnosis not present

## 2021-08-31 DIAGNOSIS — I351 Nonrheumatic aortic (valve) insufficiency: Secondary | ICD-10-CM | POA: Diagnosis not present

## 2021-08-31 DIAGNOSIS — I2510 Atherosclerotic heart disease of native coronary artery without angina pectoris: Secondary | ICD-10-CM | POA: Diagnosis not present

## 2021-09-01 DIAGNOSIS — I2510 Atherosclerotic heart disease of native coronary artery without angina pectoris: Secondary | ICD-10-CM | POA: Diagnosis not present

## 2021-09-01 DIAGNOSIS — E785 Hyperlipidemia, unspecified: Secondary | ICD-10-CM | POA: Diagnosis not present

## 2021-09-01 DIAGNOSIS — I739 Peripheral vascular disease, unspecified: Secondary | ICD-10-CM | POA: Diagnosis not present

## 2021-09-01 DIAGNOSIS — I351 Nonrheumatic aortic (valve) insufficiency: Secondary | ICD-10-CM | POA: Diagnosis not present

## 2021-09-01 DIAGNOSIS — I10 Essential (primary) hypertension: Secondary | ICD-10-CM | POA: Diagnosis not present

## 2021-09-01 DIAGNOSIS — I350 Nonrheumatic aortic (valve) stenosis: Secondary | ICD-10-CM | POA: Diagnosis not present

## 2021-09-04 DIAGNOSIS — I10 Essential (primary) hypertension: Secondary | ICD-10-CM | POA: Diagnosis not present

## 2021-09-04 DIAGNOSIS — I739 Peripheral vascular disease, unspecified: Secondary | ICD-10-CM | POA: Diagnosis not present

## 2021-09-04 DIAGNOSIS — E785 Hyperlipidemia, unspecified: Secondary | ICD-10-CM | POA: Diagnosis not present

## 2021-09-04 DIAGNOSIS — I2510 Atherosclerotic heart disease of native coronary artery without angina pectoris: Secondary | ICD-10-CM | POA: Diagnosis not present

## 2021-09-04 DIAGNOSIS — I350 Nonrheumatic aortic (valve) stenosis: Secondary | ICD-10-CM | POA: Diagnosis not present

## 2021-09-04 DIAGNOSIS — I351 Nonrheumatic aortic (valve) insufficiency: Secondary | ICD-10-CM | POA: Diagnosis not present

## 2021-09-05 DIAGNOSIS — I2510 Atherosclerotic heart disease of native coronary artery without angina pectoris: Secondary | ICD-10-CM | POA: Diagnosis not present

## 2021-09-05 DIAGNOSIS — I351 Nonrheumatic aortic (valve) insufficiency: Secondary | ICD-10-CM | POA: Diagnosis not present

## 2021-09-05 DIAGNOSIS — I350 Nonrheumatic aortic (valve) stenosis: Secondary | ICD-10-CM | POA: Diagnosis not present

## 2021-09-05 DIAGNOSIS — E785 Hyperlipidemia, unspecified: Secondary | ICD-10-CM | POA: Diagnosis not present

## 2021-09-05 DIAGNOSIS — I10 Essential (primary) hypertension: Secondary | ICD-10-CM | POA: Diagnosis not present

## 2021-09-05 DIAGNOSIS — I739 Peripheral vascular disease, unspecified: Secondary | ICD-10-CM | POA: Diagnosis not present

## 2021-09-06 DIAGNOSIS — I739 Peripheral vascular disease, unspecified: Secondary | ICD-10-CM | POA: Diagnosis not present

## 2021-09-06 DIAGNOSIS — I350 Nonrheumatic aortic (valve) stenosis: Secondary | ICD-10-CM | POA: Diagnosis not present

## 2021-09-06 DIAGNOSIS — I10 Essential (primary) hypertension: Secondary | ICD-10-CM | POA: Diagnosis not present

## 2021-09-06 DIAGNOSIS — I351 Nonrheumatic aortic (valve) insufficiency: Secondary | ICD-10-CM | POA: Diagnosis not present

## 2021-09-06 DIAGNOSIS — I2510 Atherosclerotic heart disease of native coronary artery without angina pectoris: Secondary | ICD-10-CM | POA: Diagnosis not present

## 2021-09-06 DIAGNOSIS — E785 Hyperlipidemia, unspecified: Secondary | ICD-10-CM | POA: Diagnosis not present

## 2021-09-07 DIAGNOSIS — I739 Peripheral vascular disease, unspecified: Secondary | ICD-10-CM | POA: Diagnosis not present

## 2021-09-07 DIAGNOSIS — I10 Essential (primary) hypertension: Secondary | ICD-10-CM | POA: Diagnosis not present

## 2021-09-07 DIAGNOSIS — E785 Hyperlipidemia, unspecified: Secondary | ICD-10-CM | POA: Diagnosis not present

## 2021-09-07 DIAGNOSIS — I351 Nonrheumatic aortic (valve) insufficiency: Secondary | ICD-10-CM | POA: Diagnosis not present

## 2021-09-07 DIAGNOSIS — I2510 Atherosclerotic heart disease of native coronary artery without angina pectoris: Secondary | ICD-10-CM | POA: Diagnosis not present

## 2021-09-07 DIAGNOSIS — I350 Nonrheumatic aortic (valve) stenosis: Secondary | ICD-10-CM | POA: Diagnosis not present

## 2021-09-08 DIAGNOSIS — I350 Nonrheumatic aortic (valve) stenosis: Secondary | ICD-10-CM | POA: Diagnosis not present

## 2021-09-08 DIAGNOSIS — E785 Hyperlipidemia, unspecified: Secondary | ICD-10-CM | POA: Diagnosis not present

## 2021-09-08 DIAGNOSIS — I10 Essential (primary) hypertension: Secondary | ICD-10-CM | POA: Diagnosis not present

## 2021-09-08 DIAGNOSIS — I739 Peripheral vascular disease, unspecified: Secondary | ICD-10-CM | POA: Diagnosis not present

## 2021-09-08 DIAGNOSIS — I351 Nonrheumatic aortic (valve) insufficiency: Secondary | ICD-10-CM | POA: Diagnosis not present

## 2021-09-08 DIAGNOSIS — I2510 Atherosclerotic heart disease of native coronary artery without angina pectoris: Secondary | ICD-10-CM | POA: Diagnosis not present

## 2021-09-11 DIAGNOSIS — I739 Peripheral vascular disease, unspecified: Secondary | ICD-10-CM | POA: Diagnosis not present

## 2021-09-11 DIAGNOSIS — E785 Hyperlipidemia, unspecified: Secondary | ICD-10-CM | POA: Diagnosis not present

## 2021-09-11 DIAGNOSIS — I351 Nonrheumatic aortic (valve) insufficiency: Secondary | ICD-10-CM | POA: Diagnosis not present

## 2021-09-11 DIAGNOSIS — I10 Essential (primary) hypertension: Secondary | ICD-10-CM | POA: Diagnosis not present

## 2021-09-11 DIAGNOSIS — I2510 Atherosclerotic heart disease of native coronary artery without angina pectoris: Secondary | ICD-10-CM | POA: Diagnosis not present

## 2021-09-11 DIAGNOSIS — I350 Nonrheumatic aortic (valve) stenosis: Secondary | ICD-10-CM | POA: Diagnosis not present

## 2021-09-13 DIAGNOSIS — I10 Essential (primary) hypertension: Secondary | ICD-10-CM | POA: Diagnosis not present

## 2021-09-13 DIAGNOSIS — I739 Peripheral vascular disease, unspecified: Secondary | ICD-10-CM | POA: Diagnosis not present

## 2021-09-13 DIAGNOSIS — I350 Nonrheumatic aortic (valve) stenosis: Secondary | ICD-10-CM | POA: Diagnosis not present

## 2021-09-13 DIAGNOSIS — E785 Hyperlipidemia, unspecified: Secondary | ICD-10-CM | POA: Diagnosis not present

## 2021-09-13 DIAGNOSIS — I2510 Atherosclerotic heart disease of native coronary artery without angina pectoris: Secondary | ICD-10-CM | POA: Diagnosis not present

## 2021-09-13 DIAGNOSIS — I351 Nonrheumatic aortic (valve) insufficiency: Secondary | ICD-10-CM | POA: Diagnosis not present

## 2021-09-14 DIAGNOSIS — H409 Unspecified glaucoma: Secondary | ICD-10-CM | POA: Diagnosis not present

## 2021-09-14 DIAGNOSIS — E785 Hyperlipidemia, unspecified: Secondary | ICD-10-CM | POA: Diagnosis not present

## 2021-09-14 DIAGNOSIS — M1990 Unspecified osteoarthritis, unspecified site: Secondary | ICD-10-CM | POA: Diagnosis not present

## 2021-09-14 DIAGNOSIS — F0390 Unspecified dementia without behavioral disturbance: Secondary | ICD-10-CM | POA: Diagnosis not present

## 2021-09-14 DIAGNOSIS — F419 Anxiety disorder, unspecified: Secondary | ICD-10-CM | POA: Diagnosis not present

## 2021-09-14 DIAGNOSIS — K219 Gastro-esophageal reflux disease without esophagitis: Secondary | ICD-10-CM | POA: Diagnosis not present

## 2021-09-14 DIAGNOSIS — I350 Nonrheumatic aortic (valve) stenosis: Secondary | ICD-10-CM | POA: Diagnosis not present

## 2021-09-14 DIAGNOSIS — I351 Nonrheumatic aortic (valve) insufficiency: Secondary | ICD-10-CM | POA: Diagnosis not present

## 2021-09-14 DIAGNOSIS — I10 Essential (primary) hypertension: Secondary | ICD-10-CM | POA: Diagnosis not present

## 2021-09-14 DIAGNOSIS — I2510 Atherosclerotic heart disease of native coronary artery without angina pectoris: Secondary | ICD-10-CM | POA: Diagnosis not present

## 2021-09-14 DIAGNOSIS — D649 Anemia, unspecified: Secondary | ICD-10-CM | POA: Diagnosis not present

## 2021-09-14 DIAGNOSIS — I739 Peripheral vascular disease, unspecified: Secondary | ICD-10-CM | POA: Diagnosis not present

## 2021-09-15 DIAGNOSIS — I739 Peripheral vascular disease, unspecified: Secondary | ICD-10-CM | POA: Diagnosis not present

## 2021-09-15 DIAGNOSIS — I10 Essential (primary) hypertension: Secondary | ICD-10-CM | POA: Diagnosis not present

## 2021-09-15 DIAGNOSIS — I351 Nonrheumatic aortic (valve) insufficiency: Secondary | ICD-10-CM | POA: Diagnosis not present

## 2021-09-15 DIAGNOSIS — I350 Nonrheumatic aortic (valve) stenosis: Secondary | ICD-10-CM | POA: Diagnosis not present

## 2021-09-15 DIAGNOSIS — E785 Hyperlipidemia, unspecified: Secondary | ICD-10-CM | POA: Diagnosis not present

## 2021-09-15 DIAGNOSIS — I2510 Atherosclerotic heart disease of native coronary artery without angina pectoris: Secondary | ICD-10-CM | POA: Diagnosis not present

## 2021-09-18 DIAGNOSIS — I350 Nonrheumatic aortic (valve) stenosis: Secondary | ICD-10-CM | POA: Diagnosis not present

## 2021-09-18 DIAGNOSIS — I351 Nonrheumatic aortic (valve) insufficiency: Secondary | ICD-10-CM | POA: Diagnosis not present

## 2021-09-18 DIAGNOSIS — E785 Hyperlipidemia, unspecified: Secondary | ICD-10-CM | POA: Diagnosis not present

## 2021-09-18 DIAGNOSIS — I739 Peripheral vascular disease, unspecified: Secondary | ICD-10-CM | POA: Diagnosis not present

## 2021-09-18 DIAGNOSIS — I2510 Atherosclerotic heart disease of native coronary artery without angina pectoris: Secondary | ICD-10-CM | POA: Diagnosis not present

## 2021-09-18 DIAGNOSIS — I10 Essential (primary) hypertension: Secondary | ICD-10-CM | POA: Diagnosis not present

## 2021-09-20 DIAGNOSIS — I351 Nonrheumatic aortic (valve) insufficiency: Secondary | ICD-10-CM | POA: Diagnosis not present

## 2021-09-20 DIAGNOSIS — I739 Peripheral vascular disease, unspecified: Secondary | ICD-10-CM | POA: Diagnosis not present

## 2021-09-20 DIAGNOSIS — I350 Nonrheumatic aortic (valve) stenosis: Secondary | ICD-10-CM | POA: Diagnosis not present

## 2021-09-20 DIAGNOSIS — I10 Essential (primary) hypertension: Secondary | ICD-10-CM | POA: Diagnosis not present

## 2021-09-20 DIAGNOSIS — E785 Hyperlipidemia, unspecified: Secondary | ICD-10-CM | POA: Diagnosis not present

## 2021-09-20 DIAGNOSIS — I2510 Atherosclerotic heart disease of native coronary artery without angina pectoris: Secondary | ICD-10-CM | POA: Diagnosis not present

## 2021-09-21 DIAGNOSIS — I351 Nonrheumatic aortic (valve) insufficiency: Secondary | ICD-10-CM | POA: Diagnosis not present

## 2021-09-21 DIAGNOSIS — E785 Hyperlipidemia, unspecified: Secondary | ICD-10-CM | POA: Diagnosis not present

## 2021-09-21 DIAGNOSIS — I2510 Atherosclerotic heart disease of native coronary artery without angina pectoris: Secondary | ICD-10-CM | POA: Diagnosis not present

## 2021-09-21 DIAGNOSIS — I350 Nonrheumatic aortic (valve) stenosis: Secondary | ICD-10-CM | POA: Diagnosis not present

## 2021-09-21 DIAGNOSIS — I10 Essential (primary) hypertension: Secondary | ICD-10-CM | POA: Diagnosis not present

## 2021-09-21 DIAGNOSIS — I739 Peripheral vascular disease, unspecified: Secondary | ICD-10-CM | POA: Diagnosis not present

## 2021-09-22 DIAGNOSIS — I2510 Atherosclerotic heart disease of native coronary artery without angina pectoris: Secondary | ICD-10-CM | POA: Diagnosis not present

## 2021-09-22 DIAGNOSIS — E785 Hyperlipidemia, unspecified: Secondary | ICD-10-CM | POA: Diagnosis not present

## 2021-09-22 DIAGNOSIS — I10 Essential (primary) hypertension: Secondary | ICD-10-CM | POA: Diagnosis not present

## 2021-09-22 DIAGNOSIS — I351 Nonrheumatic aortic (valve) insufficiency: Secondary | ICD-10-CM | POA: Diagnosis not present

## 2021-09-22 DIAGNOSIS — I350 Nonrheumatic aortic (valve) stenosis: Secondary | ICD-10-CM | POA: Diagnosis not present

## 2021-09-22 DIAGNOSIS — I739 Peripheral vascular disease, unspecified: Secondary | ICD-10-CM | POA: Diagnosis not present

## 2021-09-25 DIAGNOSIS — I10 Essential (primary) hypertension: Secondary | ICD-10-CM | POA: Diagnosis not present

## 2021-09-25 DIAGNOSIS — E785 Hyperlipidemia, unspecified: Secondary | ICD-10-CM | POA: Diagnosis not present

## 2021-09-25 DIAGNOSIS — I739 Peripheral vascular disease, unspecified: Secondary | ICD-10-CM | POA: Diagnosis not present

## 2021-09-25 DIAGNOSIS — I351 Nonrheumatic aortic (valve) insufficiency: Secondary | ICD-10-CM | POA: Diagnosis not present

## 2021-09-25 DIAGNOSIS — I350 Nonrheumatic aortic (valve) stenosis: Secondary | ICD-10-CM | POA: Diagnosis not present

## 2021-09-25 DIAGNOSIS — I2510 Atherosclerotic heart disease of native coronary artery without angina pectoris: Secondary | ICD-10-CM | POA: Diagnosis not present

## 2021-09-27 ENCOUNTER — Other Ambulatory Visit (HOSPITAL_COMMUNITY): Payer: Self-pay

## 2021-09-27 DIAGNOSIS — E785 Hyperlipidemia, unspecified: Secondary | ICD-10-CM | POA: Diagnosis not present

## 2021-09-27 DIAGNOSIS — I351 Nonrheumatic aortic (valve) insufficiency: Secondary | ICD-10-CM | POA: Diagnosis not present

## 2021-09-27 DIAGNOSIS — I739 Peripheral vascular disease, unspecified: Secondary | ICD-10-CM | POA: Diagnosis not present

## 2021-09-27 DIAGNOSIS — I350 Nonrheumatic aortic (valve) stenosis: Secondary | ICD-10-CM | POA: Diagnosis not present

## 2021-09-27 DIAGNOSIS — I10 Essential (primary) hypertension: Secondary | ICD-10-CM | POA: Diagnosis not present

## 2021-09-27 DIAGNOSIS — I2510 Atherosclerotic heart disease of native coronary artery without angina pectoris: Secondary | ICD-10-CM | POA: Diagnosis not present

## 2021-09-28 DIAGNOSIS — I350 Nonrheumatic aortic (valve) stenosis: Secondary | ICD-10-CM | POA: Diagnosis not present

## 2021-09-28 DIAGNOSIS — I739 Peripheral vascular disease, unspecified: Secondary | ICD-10-CM | POA: Diagnosis not present

## 2021-09-28 DIAGNOSIS — I351 Nonrheumatic aortic (valve) insufficiency: Secondary | ICD-10-CM | POA: Diagnosis not present

## 2021-09-28 DIAGNOSIS — I10 Essential (primary) hypertension: Secondary | ICD-10-CM | POA: Diagnosis not present

## 2021-09-28 DIAGNOSIS — E785 Hyperlipidemia, unspecified: Secondary | ICD-10-CM | POA: Diagnosis not present

## 2021-09-28 DIAGNOSIS — I2510 Atherosclerotic heart disease of native coronary artery without angina pectoris: Secondary | ICD-10-CM | POA: Diagnosis not present

## 2021-09-29 DIAGNOSIS — I10 Essential (primary) hypertension: Secondary | ICD-10-CM | POA: Diagnosis not present

## 2021-09-29 DIAGNOSIS — I2510 Atherosclerotic heart disease of native coronary artery without angina pectoris: Secondary | ICD-10-CM | POA: Diagnosis not present

## 2021-09-29 DIAGNOSIS — I350 Nonrheumatic aortic (valve) stenosis: Secondary | ICD-10-CM | POA: Diagnosis not present

## 2021-09-29 DIAGNOSIS — I739 Peripheral vascular disease, unspecified: Secondary | ICD-10-CM | POA: Diagnosis not present

## 2021-09-29 DIAGNOSIS — E785 Hyperlipidemia, unspecified: Secondary | ICD-10-CM | POA: Diagnosis not present

## 2021-09-29 DIAGNOSIS — I351 Nonrheumatic aortic (valve) insufficiency: Secondary | ICD-10-CM | POA: Diagnosis not present

## 2021-10-02 DIAGNOSIS — E785 Hyperlipidemia, unspecified: Secondary | ICD-10-CM | POA: Diagnosis not present

## 2021-10-02 DIAGNOSIS — I350 Nonrheumatic aortic (valve) stenosis: Secondary | ICD-10-CM | POA: Diagnosis not present

## 2021-10-02 DIAGNOSIS — I351 Nonrheumatic aortic (valve) insufficiency: Secondary | ICD-10-CM | POA: Diagnosis not present

## 2021-10-02 DIAGNOSIS — I2510 Atherosclerotic heart disease of native coronary artery without angina pectoris: Secondary | ICD-10-CM | POA: Diagnosis not present

## 2021-10-02 DIAGNOSIS — I739 Peripheral vascular disease, unspecified: Secondary | ICD-10-CM | POA: Diagnosis not present

## 2021-10-02 DIAGNOSIS — I10 Essential (primary) hypertension: Secondary | ICD-10-CM | POA: Diagnosis not present

## 2021-10-04 DIAGNOSIS — E785 Hyperlipidemia, unspecified: Secondary | ICD-10-CM | POA: Diagnosis not present

## 2021-10-04 DIAGNOSIS — I350 Nonrheumatic aortic (valve) stenosis: Secondary | ICD-10-CM | POA: Diagnosis not present

## 2021-10-04 DIAGNOSIS — I2510 Atherosclerotic heart disease of native coronary artery without angina pectoris: Secondary | ICD-10-CM | POA: Diagnosis not present

## 2021-10-04 DIAGNOSIS — I10 Essential (primary) hypertension: Secondary | ICD-10-CM | POA: Diagnosis not present

## 2021-10-04 DIAGNOSIS — I739 Peripheral vascular disease, unspecified: Secondary | ICD-10-CM | POA: Diagnosis not present

## 2021-10-04 DIAGNOSIS — I351 Nonrheumatic aortic (valve) insufficiency: Secondary | ICD-10-CM | POA: Diagnosis not present

## 2021-10-05 DIAGNOSIS — I351 Nonrheumatic aortic (valve) insufficiency: Secondary | ICD-10-CM | POA: Diagnosis not present

## 2021-10-05 DIAGNOSIS — I2510 Atherosclerotic heart disease of native coronary artery without angina pectoris: Secondary | ICD-10-CM | POA: Diagnosis not present

## 2021-10-05 DIAGNOSIS — I350 Nonrheumatic aortic (valve) stenosis: Secondary | ICD-10-CM | POA: Diagnosis not present

## 2021-10-05 DIAGNOSIS — E785 Hyperlipidemia, unspecified: Secondary | ICD-10-CM | POA: Diagnosis not present

## 2021-10-05 DIAGNOSIS — I739 Peripheral vascular disease, unspecified: Secondary | ICD-10-CM | POA: Diagnosis not present

## 2021-10-05 DIAGNOSIS — I10 Essential (primary) hypertension: Secondary | ICD-10-CM | POA: Diagnosis not present

## 2021-10-06 DIAGNOSIS — I350 Nonrheumatic aortic (valve) stenosis: Secondary | ICD-10-CM | POA: Diagnosis not present

## 2021-10-06 DIAGNOSIS — I10 Essential (primary) hypertension: Secondary | ICD-10-CM | POA: Diagnosis not present

## 2021-10-06 DIAGNOSIS — I351 Nonrheumatic aortic (valve) insufficiency: Secondary | ICD-10-CM | POA: Diagnosis not present

## 2021-10-06 DIAGNOSIS — I2510 Atherosclerotic heart disease of native coronary artery without angina pectoris: Secondary | ICD-10-CM | POA: Diagnosis not present

## 2021-10-06 DIAGNOSIS — I739 Peripheral vascular disease, unspecified: Secondary | ICD-10-CM | POA: Diagnosis not present

## 2021-10-06 DIAGNOSIS — E785 Hyperlipidemia, unspecified: Secondary | ICD-10-CM | POA: Diagnosis not present

## 2021-10-09 DIAGNOSIS — E785 Hyperlipidemia, unspecified: Secondary | ICD-10-CM | POA: Diagnosis not present

## 2021-10-09 DIAGNOSIS — I350 Nonrheumatic aortic (valve) stenosis: Secondary | ICD-10-CM | POA: Diagnosis not present

## 2021-10-09 DIAGNOSIS — I351 Nonrheumatic aortic (valve) insufficiency: Secondary | ICD-10-CM | POA: Diagnosis not present

## 2021-10-09 DIAGNOSIS — I10 Essential (primary) hypertension: Secondary | ICD-10-CM | POA: Diagnosis not present

## 2021-10-09 DIAGNOSIS — I739 Peripheral vascular disease, unspecified: Secondary | ICD-10-CM | POA: Diagnosis not present

## 2021-10-09 DIAGNOSIS — I2510 Atherosclerotic heart disease of native coronary artery without angina pectoris: Secondary | ICD-10-CM | POA: Diagnosis not present

## 2021-10-11 DIAGNOSIS — E785 Hyperlipidemia, unspecified: Secondary | ICD-10-CM | POA: Diagnosis not present

## 2021-10-11 DIAGNOSIS — I350 Nonrheumatic aortic (valve) stenosis: Secondary | ICD-10-CM | POA: Diagnosis not present

## 2021-10-11 DIAGNOSIS — I351 Nonrheumatic aortic (valve) insufficiency: Secondary | ICD-10-CM | POA: Diagnosis not present

## 2021-10-11 DIAGNOSIS — I10 Essential (primary) hypertension: Secondary | ICD-10-CM | POA: Diagnosis not present

## 2021-10-11 DIAGNOSIS — I739 Peripheral vascular disease, unspecified: Secondary | ICD-10-CM | POA: Diagnosis not present

## 2021-10-11 DIAGNOSIS — I2510 Atherosclerotic heart disease of native coronary artery without angina pectoris: Secondary | ICD-10-CM | POA: Diagnosis not present

## 2021-10-12 DIAGNOSIS — I351 Nonrheumatic aortic (valve) insufficiency: Secondary | ICD-10-CM | POA: Diagnosis not present

## 2021-10-12 DIAGNOSIS — I2510 Atherosclerotic heart disease of native coronary artery without angina pectoris: Secondary | ICD-10-CM | POA: Diagnosis not present

## 2021-10-12 DIAGNOSIS — I739 Peripheral vascular disease, unspecified: Secondary | ICD-10-CM | POA: Diagnosis not present

## 2021-10-12 DIAGNOSIS — E785 Hyperlipidemia, unspecified: Secondary | ICD-10-CM | POA: Diagnosis not present

## 2021-10-12 DIAGNOSIS — I350 Nonrheumatic aortic (valve) stenosis: Secondary | ICD-10-CM | POA: Diagnosis not present

## 2021-10-12 DIAGNOSIS — I10 Essential (primary) hypertension: Secondary | ICD-10-CM | POA: Diagnosis not present

## 2021-10-13 DIAGNOSIS — E785 Hyperlipidemia, unspecified: Secondary | ICD-10-CM | POA: Diagnosis not present

## 2021-10-13 DIAGNOSIS — I2510 Atherosclerotic heart disease of native coronary artery without angina pectoris: Secondary | ICD-10-CM | POA: Diagnosis not present

## 2021-10-13 DIAGNOSIS — I350 Nonrheumatic aortic (valve) stenosis: Secondary | ICD-10-CM | POA: Diagnosis not present

## 2021-10-13 DIAGNOSIS — I10 Essential (primary) hypertension: Secondary | ICD-10-CM | POA: Diagnosis not present

## 2021-10-13 DIAGNOSIS — I739 Peripheral vascular disease, unspecified: Secondary | ICD-10-CM | POA: Diagnosis not present

## 2021-10-13 DIAGNOSIS — I351 Nonrheumatic aortic (valve) insufficiency: Secondary | ICD-10-CM | POA: Diagnosis not present

## 2021-10-14 DIAGNOSIS — F0390 Unspecified dementia without behavioral disturbance: Secondary | ICD-10-CM | POA: Diagnosis not present

## 2021-10-14 DIAGNOSIS — M1990 Unspecified osteoarthritis, unspecified site: Secondary | ICD-10-CM | POA: Diagnosis not present

## 2021-10-14 DIAGNOSIS — I2510 Atherosclerotic heart disease of native coronary artery without angina pectoris: Secondary | ICD-10-CM | POA: Diagnosis not present

## 2021-10-14 DIAGNOSIS — H409 Unspecified glaucoma: Secondary | ICD-10-CM | POA: Diagnosis not present

## 2021-10-14 DIAGNOSIS — I739 Peripheral vascular disease, unspecified: Secondary | ICD-10-CM | POA: Diagnosis not present

## 2021-10-14 DIAGNOSIS — I350 Nonrheumatic aortic (valve) stenosis: Secondary | ICD-10-CM | POA: Diagnosis not present

## 2021-10-14 DIAGNOSIS — I10 Essential (primary) hypertension: Secondary | ICD-10-CM | POA: Diagnosis not present

## 2021-10-14 DIAGNOSIS — I351 Nonrheumatic aortic (valve) insufficiency: Secondary | ICD-10-CM | POA: Diagnosis not present

## 2021-10-14 DIAGNOSIS — E785 Hyperlipidemia, unspecified: Secondary | ICD-10-CM | POA: Diagnosis not present

## 2021-10-14 DIAGNOSIS — K219 Gastro-esophageal reflux disease without esophagitis: Secondary | ICD-10-CM | POA: Diagnosis not present

## 2021-10-14 DIAGNOSIS — F419 Anxiety disorder, unspecified: Secondary | ICD-10-CM | POA: Diagnosis not present

## 2021-10-14 DIAGNOSIS — D649 Anemia, unspecified: Secondary | ICD-10-CM | POA: Diagnosis not present

## 2021-10-16 DIAGNOSIS — I350 Nonrheumatic aortic (valve) stenosis: Secondary | ICD-10-CM | POA: Diagnosis not present

## 2021-10-16 DIAGNOSIS — I351 Nonrheumatic aortic (valve) insufficiency: Secondary | ICD-10-CM | POA: Diagnosis not present

## 2021-10-16 DIAGNOSIS — I2510 Atherosclerotic heart disease of native coronary artery without angina pectoris: Secondary | ICD-10-CM | POA: Diagnosis not present

## 2021-10-16 DIAGNOSIS — I739 Peripheral vascular disease, unspecified: Secondary | ICD-10-CM | POA: Diagnosis not present

## 2021-10-16 DIAGNOSIS — E785 Hyperlipidemia, unspecified: Secondary | ICD-10-CM | POA: Diagnosis not present

## 2021-10-16 DIAGNOSIS — I10 Essential (primary) hypertension: Secondary | ICD-10-CM | POA: Diagnosis not present

## 2021-10-18 DIAGNOSIS — I739 Peripheral vascular disease, unspecified: Secondary | ICD-10-CM | POA: Diagnosis not present

## 2021-10-18 DIAGNOSIS — E785 Hyperlipidemia, unspecified: Secondary | ICD-10-CM | POA: Diagnosis not present

## 2021-10-18 DIAGNOSIS — I10 Essential (primary) hypertension: Secondary | ICD-10-CM | POA: Diagnosis not present

## 2021-10-18 DIAGNOSIS — I351 Nonrheumatic aortic (valve) insufficiency: Secondary | ICD-10-CM | POA: Diagnosis not present

## 2021-10-18 DIAGNOSIS — I350 Nonrheumatic aortic (valve) stenosis: Secondary | ICD-10-CM | POA: Diagnosis not present

## 2021-10-18 DIAGNOSIS — I2510 Atherosclerotic heart disease of native coronary artery without angina pectoris: Secondary | ICD-10-CM | POA: Diagnosis not present

## 2021-10-19 DIAGNOSIS — I351 Nonrheumatic aortic (valve) insufficiency: Secondary | ICD-10-CM | POA: Diagnosis not present

## 2021-10-19 DIAGNOSIS — I739 Peripheral vascular disease, unspecified: Secondary | ICD-10-CM | POA: Diagnosis not present

## 2021-10-19 DIAGNOSIS — E785 Hyperlipidemia, unspecified: Secondary | ICD-10-CM | POA: Diagnosis not present

## 2021-10-19 DIAGNOSIS — I350 Nonrheumatic aortic (valve) stenosis: Secondary | ICD-10-CM | POA: Diagnosis not present

## 2021-10-19 DIAGNOSIS — I10 Essential (primary) hypertension: Secondary | ICD-10-CM | POA: Diagnosis not present

## 2021-10-19 DIAGNOSIS — I2510 Atherosclerotic heart disease of native coronary artery without angina pectoris: Secondary | ICD-10-CM | POA: Diagnosis not present

## 2021-10-20 DIAGNOSIS — I10 Essential (primary) hypertension: Secondary | ICD-10-CM | POA: Diagnosis not present

## 2021-10-20 DIAGNOSIS — I739 Peripheral vascular disease, unspecified: Secondary | ICD-10-CM | POA: Diagnosis not present

## 2021-10-20 DIAGNOSIS — E785 Hyperlipidemia, unspecified: Secondary | ICD-10-CM | POA: Diagnosis not present

## 2021-10-20 DIAGNOSIS — I2510 Atherosclerotic heart disease of native coronary artery without angina pectoris: Secondary | ICD-10-CM | POA: Diagnosis not present

## 2021-10-20 DIAGNOSIS — I351 Nonrheumatic aortic (valve) insufficiency: Secondary | ICD-10-CM | POA: Diagnosis not present

## 2021-10-20 DIAGNOSIS — I350 Nonrheumatic aortic (valve) stenosis: Secondary | ICD-10-CM | POA: Diagnosis not present

## 2021-10-23 DIAGNOSIS — I10 Essential (primary) hypertension: Secondary | ICD-10-CM | POA: Diagnosis not present

## 2021-10-23 DIAGNOSIS — I350 Nonrheumatic aortic (valve) stenosis: Secondary | ICD-10-CM | POA: Diagnosis not present

## 2021-10-23 DIAGNOSIS — E785 Hyperlipidemia, unspecified: Secondary | ICD-10-CM | POA: Diagnosis not present

## 2021-10-23 DIAGNOSIS — I739 Peripheral vascular disease, unspecified: Secondary | ICD-10-CM | POA: Diagnosis not present

## 2021-10-23 DIAGNOSIS — I351 Nonrheumatic aortic (valve) insufficiency: Secondary | ICD-10-CM | POA: Diagnosis not present

## 2021-10-23 DIAGNOSIS — I2510 Atherosclerotic heart disease of native coronary artery without angina pectoris: Secondary | ICD-10-CM | POA: Diagnosis not present

## 2021-10-25 DIAGNOSIS — I2510 Atherosclerotic heart disease of native coronary artery without angina pectoris: Secondary | ICD-10-CM | POA: Diagnosis not present

## 2021-10-25 DIAGNOSIS — E785 Hyperlipidemia, unspecified: Secondary | ICD-10-CM | POA: Diagnosis not present

## 2021-10-25 DIAGNOSIS — I10 Essential (primary) hypertension: Secondary | ICD-10-CM | POA: Diagnosis not present

## 2021-10-25 DIAGNOSIS — I351 Nonrheumatic aortic (valve) insufficiency: Secondary | ICD-10-CM | POA: Diagnosis not present

## 2021-10-25 DIAGNOSIS — I739 Peripheral vascular disease, unspecified: Secondary | ICD-10-CM | POA: Diagnosis not present

## 2021-10-25 DIAGNOSIS — I350 Nonrheumatic aortic (valve) stenosis: Secondary | ICD-10-CM | POA: Diagnosis not present

## 2021-10-26 DIAGNOSIS — I739 Peripheral vascular disease, unspecified: Secondary | ICD-10-CM | POA: Diagnosis not present

## 2021-10-26 DIAGNOSIS — I350 Nonrheumatic aortic (valve) stenosis: Secondary | ICD-10-CM | POA: Diagnosis not present

## 2021-10-26 DIAGNOSIS — E785 Hyperlipidemia, unspecified: Secondary | ICD-10-CM | POA: Diagnosis not present

## 2021-10-26 DIAGNOSIS — I2510 Atherosclerotic heart disease of native coronary artery without angina pectoris: Secondary | ICD-10-CM | POA: Diagnosis not present

## 2021-10-26 DIAGNOSIS — I10 Essential (primary) hypertension: Secondary | ICD-10-CM | POA: Diagnosis not present

## 2021-10-26 DIAGNOSIS — I351 Nonrheumatic aortic (valve) insufficiency: Secondary | ICD-10-CM | POA: Diagnosis not present

## 2021-10-27 DIAGNOSIS — I10 Essential (primary) hypertension: Secondary | ICD-10-CM | POA: Diagnosis not present

## 2021-10-27 DIAGNOSIS — I350 Nonrheumatic aortic (valve) stenosis: Secondary | ICD-10-CM | POA: Diagnosis not present

## 2021-10-27 DIAGNOSIS — I739 Peripheral vascular disease, unspecified: Secondary | ICD-10-CM | POA: Diagnosis not present

## 2021-10-27 DIAGNOSIS — E785 Hyperlipidemia, unspecified: Secondary | ICD-10-CM | POA: Diagnosis not present

## 2021-10-27 DIAGNOSIS — I351 Nonrheumatic aortic (valve) insufficiency: Secondary | ICD-10-CM | POA: Diagnosis not present

## 2021-10-27 DIAGNOSIS — I2510 Atherosclerotic heart disease of native coronary artery without angina pectoris: Secondary | ICD-10-CM | POA: Diagnosis not present

## 2021-10-30 DIAGNOSIS — I739 Peripheral vascular disease, unspecified: Secondary | ICD-10-CM | POA: Diagnosis not present

## 2021-10-30 DIAGNOSIS — I350 Nonrheumatic aortic (valve) stenosis: Secondary | ICD-10-CM | POA: Diagnosis not present

## 2021-10-30 DIAGNOSIS — E785 Hyperlipidemia, unspecified: Secondary | ICD-10-CM | POA: Diagnosis not present

## 2021-10-30 DIAGNOSIS — I351 Nonrheumatic aortic (valve) insufficiency: Secondary | ICD-10-CM | POA: Diagnosis not present

## 2021-10-30 DIAGNOSIS — I2510 Atherosclerotic heart disease of native coronary artery without angina pectoris: Secondary | ICD-10-CM | POA: Diagnosis not present

## 2021-10-30 DIAGNOSIS — I10 Essential (primary) hypertension: Secondary | ICD-10-CM | POA: Diagnosis not present

## 2021-11-01 DIAGNOSIS — I10 Essential (primary) hypertension: Secondary | ICD-10-CM | POA: Diagnosis not present

## 2021-11-01 DIAGNOSIS — I739 Peripheral vascular disease, unspecified: Secondary | ICD-10-CM | POA: Diagnosis not present

## 2021-11-01 DIAGNOSIS — I350 Nonrheumatic aortic (valve) stenosis: Secondary | ICD-10-CM | POA: Diagnosis not present

## 2021-11-01 DIAGNOSIS — I351 Nonrheumatic aortic (valve) insufficiency: Secondary | ICD-10-CM | POA: Diagnosis not present

## 2021-11-01 DIAGNOSIS — I2510 Atherosclerotic heart disease of native coronary artery without angina pectoris: Secondary | ICD-10-CM | POA: Diagnosis not present

## 2021-11-01 DIAGNOSIS — E785 Hyperlipidemia, unspecified: Secondary | ICD-10-CM | POA: Diagnosis not present

## 2021-11-03 DIAGNOSIS — I351 Nonrheumatic aortic (valve) insufficiency: Secondary | ICD-10-CM | POA: Diagnosis not present

## 2021-11-03 DIAGNOSIS — I2510 Atherosclerotic heart disease of native coronary artery without angina pectoris: Secondary | ICD-10-CM | POA: Diagnosis not present

## 2021-11-03 DIAGNOSIS — I10 Essential (primary) hypertension: Secondary | ICD-10-CM | POA: Diagnosis not present

## 2021-11-03 DIAGNOSIS — E785 Hyperlipidemia, unspecified: Secondary | ICD-10-CM | POA: Diagnosis not present

## 2021-11-03 DIAGNOSIS — I350 Nonrheumatic aortic (valve) stenosis: Secondary | ICD-10-CM | POA: Diagnosis not present

## 2021-11-03 DIAGNOSIS — I739 Peripheral vascular disease, unspecified: Secondary | ICD-10-CM | POA: Diagnosis not present

## 2021-11-06 DIAGNOSIS — I739 Peripheral vascular disease, unspecified: Secondary | ICD-10-CM | POA: Diagnosis not present

## 2021-11-06 DIAGNOSIS — I2510 Atherosclerotic heart disease of native coronary artery without angina pectoris: Secondary | ICD-10-CM | POA: Diagnosis not present

## 2021-11-06 DIAGNOSIS — I10 Essential (primary) hypertension: Secondary | ICD-10-CM | POA: Diagnosis not present

## 2021-11-06 DIAGNOSIS — E785 Hyperlipidemia, unspecified: Secondary | ICD-10-CM | POA: Diagnosis not present

## 2021-11-06 DIAGNOSIS — I351 Nonrheumatic aortic (valve) insufficiency: Secondary | ICD-10-CM | POA: Diagnosis not present

## 2021-11-06 DIAGNOSIS — I350 Nonrheumatic aortic (valve) stenosis: Secondary | ICD-10-CM | POA: Diagnosis not present

## 2021-11-08 DIAGNOSIS — I350 Nonrheumatic aortic (valve) stenosis: Secondary | ICD-10-CM | POA: Diagnosis not present

## 2021-11-08 DIAGNOSIS — I739 Peripheral vascular disease, unspecified: Secondary | ICD-10-CM | POA: Diagnosis not present

## 2021-11-08 DIAGNOSIS — I351 Nonrheumatic aortic (valve) insufficiency: Secondary | ICD-10-CM | POA: Diagnosis not present

## 2021-11-08 DIAGNOSIS — I2510 Atherosclerotic heart disease of native coronary artery without angina pectoris: Secondary | ICD-10-CM | POA: Diagnosis not present

## 2021-11-08 DIAGNOSIS — I10 Essential (primary) hypertension: Secondary | ICD-10-CM | POA: Diagnosis not present

## 2021-11-08 DIAGNOSIS — E785 Hyperlipidemia, unspecified: Secondary | ICD-10-CM | POA: Diagnosis not present

## 2021-11-09 DIAGNOSIS — I739 Peripheral vascular disease, unspecified: Secondary | ICD-10-CM | POA: Diagnosis not present

## 2021-11-09 DIAGNOSIS — I350 Nonrheumatic aortic (valve) stenosis: Secondary | ICD-10-CM | POA: Diagnosis not present

## 2021-11-09 DIAGNOSIS — I2510 Atherosclerotic heart disease of native coronary artery without angina pectoris: Secondary | ICD-10-CM | POA: Diagnosis not present

## 2021-11-09 DIAGNOSIS — I10 Essential (primary) hypertension: Secondary | ICD-10-CM | POA: Diagnosis not present

## 2021-11-09 DIAGNOSIS — I351 Nonrheumatic aortic (valve) insufficiency: Secondary | ICD-10-CM | POA: Diagnosis not present

## 2021-11-09 DIAGNOSIS — E785 Hyperlipidemia, unspecified: Secondary | ICD-10-CM | POA: Diagnosis not present

## 2021-11-10 DIAGNOSIS — I350 Nonrheumatic aortic (valve) stenosis: Secondary | ICD-10-CM | POA: Diagnosis not present

## 2021-11-10 DIAGNOSIS — I2510 Atherosclerotic heart disease of native coronary artery without angina pectoris: Secondary | ICD-10-CM | POA: Diagnosis not present

## 2021-11-10 DIAGNOSIS — E785 Hyperlipidemia, unspecified: Secondary | ICD-10-CM | POA: Diagnosis not present

## 2021-11-10 DIAGNOSIS — I739 Peripheral vascular disease, unspecified: Secondary | ICD-10-CM | POA: Diagnosis not present

## 2021-11-10 DIAGNOSIS — I351 Nonrheumatic aortic (valve) insufficiency: Secondary | ICD-10-CM | POA: Diagnosis not present

## 2021-11-10 DIAGNOSIS — I10 Essential (primary) hypertension: Secondary | ICD-10-CM | POA: Diagnosis not present

## 2021-11-13 DIAGNOSIS — I351 Nonrheumatic aortic (valve) insufficiency: Secondary | ICD-10-CM | POA: Diagnosis not present

## 2021-11-13 DIAGNOSIS — I10 Essential (primary) hypertension: Secondary | ICD-10-CM | POA: Diagnosis not present

## 2021-11-13 DIAGNOSIS — E785 Hyperlipidemia, unspecified: Secondary | ICD-10-CM | POA: Diagnosis not present

## 2021-11-13 DIAGNOSIS — I350 Nonrheumatic aortic (valve) stenosis: Secondary | ICD-10-CM | POA: Diagnosis not present

## 2021-11-13 DIAGNOSIS — I2510 Atherosclerotic heart disease of native coronary artery without angina pectoris: Secondary | ICD-10-CM | POA: Diagnosis not present

## 2021-11-13 DIAGNOSIS — I739 Peripheral vascular disease, unspecified: Secondary | ICD-10-CM | POA: Diagnosis not present

## 2021-11-14 DIAGNOSIS — E785 Hyperlipidemia, unspecified: Secondary | ICD-10-CM | POA: Diagnosis not present

## 2021-11-14 DIAGNOSIS — F0394 Unspecified dementia, unspecified severity, with anxiety: Secondary | ICD-10-CM | POA: Diagnosis not present

## 2021-11-14 DIAGNOSIS — I10 Essential (primary) hypertension: Secondary | ICD-10-CM | POA: Diagnosis not present

## 2021-11-14 DIAGNOSIS — I350 Nonrheumatic aortic (valve) stenosis: Secondary | ICD-10-CM | POA: Diagnosis not present

## 2021-11-14 DIAGNOSIS — Z741 Need for assistance with personal care: Secondary | ICD-10-CM | POA: Diagnosis not present

## 2021-11-14 DIAGNOSIS — I739 Peripheral vascular disease, unspecified: Secondary | ICD-10-CM | POA: Diagnosis not present

## 2021-11-14 DIAGNOSIS — I351 Nonrheumatic aortic (valve) insufficiency: Secondary | ICD-10-CM | POA: Diagnosis not present

## 2021-11-14 DIAGNOSIS — D649 Anemia, unspecified: Secondary | ICD-10-CM | POA: Diagnosis not present

## 2021-11-14 DIAGNOSIS — H409 Unspecified glaucoma: Secondary | ICD-10-CM | POA: Diagnosis not present

## 2021-11-14 DIAGNOSIS — I2510 Atherosclerotic heart disease of native coronary artery without angina pectoris: Secondary | ICD-10-CM | POA: Diagnosis not present

## 2021-11-14 DIAGNOSIS — M1990 Unspecified osteoarthritis, unspecified site: Secondary | ICD-10-CM | POA: Diagnosis not present

## 2021-11-14 DIAGNOSIS — K219 Gastro-esophageal reflux disease without esophagitis: Secondary | ICD-10-CM | POA: Diagnosis not present

## 2021-11-15 DIAGNOSIS — E785 Hyperlipidemia, unspecified: Secondary | ICD-10-CM | POA: Diagnosis not present

## 2021-11-15 DIAGNOSIS — I2510 Atherosclerotic heart disease of native coronary artery without angina pectoris: Secondary | ICD-10-CM | POA: Diagnosis not present

## 2021-11-15 DIAGNOSIS — I739 Peripheral vascular disease, unspecified: Secondary | ICD-10-CM | POA: Diagnosis not present

## 2021-11-15 DIAGNOSIS — I351 Nonrheumatic aortic (valve) insufficiency: Secondary | ICD-10-CM | POA: Diagnosis not present

## 2021-11-15 DIAGNOSIS — I350 Nonrheumatic aortic (valve) stenosis: Secondary | ICD-10-CM | POA: Diagnosis not present

## 2021-11-15 DIAGNOSIS — I10 Essential (primary) hypertension: Secondary | ICD-10-CM | POA: Diagnosis not present

## 2021-11-16 DIAGNOSIS — I351 Nonrheumatic aortic (valve) insufficiency: Secondary | ICD-10-CM | POA: Diagnosis not present

## 2021-11-16 DIAGNOSIS — E785 Hyperlipidemia, unspecified: Secondary | ICD-10-CM | POA: Diagnosis not present

## 2021-11-16 DIAGNOSIS — I2510 Atherosclerotic heart disease of native coronary artery without angina pectoris: Secondary | ICD-10-CM | POA: Diagnosis not present

## 2021-11-16 DIAGNOSIS — I350 Nonrheumatic aortic (valve) stenosis: Secondary | ICD-10-CM | POA: Diagnosis not present

## 2021-11-16 DIAGNOSIS — I10 Essential (primary) hypertension: Secondary | ICD-10-CM | POA: Diagnosis not present

## 2021-11-16 DIAGNOSIS — I739 Peripheral vascular disease, unspecified: Secondary | ICD-10-CM | POA: Diagnosis not present

## 2021-11-17 DIAGNOSIS — E785 Hyperlipidemia, unspecified: Secondary | ICD-10-CM | POA: Diagnosis not present

## 2021-11-17 DIAGNOSIS — I350 Nonrheumatic aortic (valve) stenosis: Secondary | ICD-10-CM | POA: Diagnosis not present

## 2021-11-17 DIAGNOSIS — I739 Peripheral vascular disease, unspecified: Secondary | ICD-10-CM | POA: Diagnosis not present

## 2021-11-17 DIAGNOSIS — I2510 Atherosclerotic heart disease of native coronary artery without angina pectoris: Secondary | ICD-10-CM | POA: Diagnosis not present

## 2021-11-17 DIAGNOSIS — I351 Nonrheumatic aortic (valve) insufficiency: Secondary | ICD-10-CM | POA: Diagnosis not present

## 2021-11-17 DIAGNOSIS — I10 Essential (primary) hypertension: Secondary | ICD-10-CM | POA: Diagnosis not present

## 2021-11-20 DIAGNOSIS — E785 Hyperlipidemia, unspecified: Secondary | ICD-10-CM | POA: Diagnosis not present

## 2021-11-20 DIAGNOSIS — I10 Essential (primary) hypertension: Secondary | ICD-10-CM | POA: Diagnosis not present

## 2021-11-20 DIAGNOSIS — I2510 Atherosclerotic heart disease of native coronary artery without angina pectoris: Secondary | ICD-10-CM | POA: Diagnosis not present

## 2021-11-20 DIAGNOSIS — I351 Nonrheumatic aortic (valve) insufficiency: Secondary | ICD-10-CM | POA: Diagnosis not present

## 2021-11-20 DIAGNOSIS — I350 Nonrheumatic aortic (valve) stenosis: Secondary | ICD-10-CM | POA: Diagnosis not present

## 2021-11-20 DIAGNOSIS — I739 Peripheral vascular disease, unspecified: Secondary | ICD-10-CM | POA: Diagnosis not present

## 2021-11-22 DIAGNOSIS — I2510 Atherosclerotic heart disease of native coronary artery without angina pectoris: Secondary | ICD-10-CM | POA: Diagnosis not present

## 2021-11-22 DIAGNOSIS — I10 Essential (primary) hypertension: Secondary | ICD-10-CM | POA: Diagnosis not present

## 2021-11-22 DIAGNOSIS — I739 Peripheral vascular disease, unspecified: Secondary | ICD-10-CM | POA: Diagnosis not present

## 2021-11-22 DIAGNOSIS — I351 Nonrheumatic aortic (valve) insufficiency: Secondary | ICD-10-CM | POA: Diagnosis not present

## 2021-11-22 DIAGNOSIS — E785 Hyperlipidemia, unspecified: Secondary | ICD-10-CM | POA: Diagnosis not present

## 2021-11-22 DIAGNOSIS — I350 Nonrheumatic aortic (valve) stenosis: Secondary | ICD-10-CM | POA: Diagnosis not present

## 2021-11-23 DIAGNOSIS — I2510 Atherosclerotic heart disease of native coronary artery without angina pectoris: Secondary | ICD-10-CM | POA: Diagnosis not present

## 2021-11-23 DIAGNOSIS — I350 Nonrheumatic aortic (valve) stenosis: Secondary | ICD-10-CM | POA: Diagnosis not present

## 2021-11-23 DIAGNOSIS — E785 Hyperlipidemia, unspecified: Secondary | ICD-10-CM | POA: Diagnosis not present

## 2021-11-23 DIAGNOSIS — I739 Peripheral vascular disease, unspecified: Secondary | ICD-10-CM | POA: Diagnosis not present

## 2021-11-23 DIAGNOSIS — I351 Nonrheumatic aortic (valve) insufficiency: Secondary | ICD-10-CM | POA: Diagnosis not present

## 2021-11-23 DIAGNOSIS — I10 Essential (primary) hypertension: Secondary | ICD-10-CM | POA: Diagnosis not present

## 2021-11-24 DIAGNOSIS — I2510 Atherosclerotic heart disease of native coronary artery without angina pectoris: Secondary | ICD-10-CM | POA: Diagnosis not present

## 2021-11-24 DIAGNOSIS — I10 Essential (primary) hypertension: Secondary | ICD-10-CM | POA: Diagnosis not present

## 2021-11-24 DIAGNOSIS — I351 Nonrheumatic aortic (valve) insufficiency: Secondary | ICD-10-CM | POA: Diagnosis not present

## 2021-11-24 DIAGNOSIS — E785 Hyperlipidemia, unspecified: Secondary | ICD-10-CM | POA: Diagnosis not present

## 2021-11-24 DIAGNOSIS — I739 Peripheral vascular disease, unspecified: Secondary | ICD-10-CM | POA: Diagnosis not present

## 2021-11-24 DIAGNOSIS — I350 Nonrheumatic aortic (valve) stenosis: Secondary | ICD-10-CM | POA: Diagnosis not present

## 2021-11-27 DIAGNOSIS — E785 Hyperlipidemia, unspecified: Secondary | ICD-10-CM | POA: Diagnosis not present

## 2021-11-27 DIAGNOSIS — I351 Nonrheumatic aortic (valve) insufficiency: Secondary | ICD-10-CM | POA: Diagnosis not present

## 2021-11-27 DIAGNOSIS — I350 Nonrheumatic aortic (valve) stenosis: Secondary | ICD-10-CM | POA: Diagnosis not present

## 2021-11-27 DIAGNOSIS — I2510 Atherosclerotic heart disease of native coronary artery without angina pectoris: Secondary | ICD-10-CM | POA: Diagnosis not present

## 2021-11-27 DIAGNOSIS — I10 Essential (primary) hypertension: Secondary | ICD-10-CM | POA: Diagnosis not present

## 2021-11-27 DIAGNOSIS — I739 Peripheral vascular disease, unspecified: Secondary | ICD-10-CM | POA: Diagnosis not present

## 2021-11-29 DIAGNOSIS — I739 Peripheral vascular disease, unspecified: Secondary | ICD-10-CM | POA: Diagnosis not present

## 2021-11-29 DIAGNOSIS — I2510 Atherosclerotic heart disease of native coronary artery without angina pectoris: Secondary | ICD-10-CM | POA: Diagnosis not present

## 2021-11-29 DIAGNOSIS — E785 Hyperlipidemia, unspecified: Secondary | ICD-10-CM | POA: Diagnosis not present

## 2021-11-29 DIAGNOSIS — I10 Essential (primary) hypertension: Secondary | ICD-10-CM | POA: Diagnosis not present

## 2021-11-29 DIAGNOSIS — I351 Nonrheumatic aortic (valve) insufficiency: Secondary | ICD-10-CM | POA: Diagnosis not present

## 2021-11-29 DIAGNOSIS — I350 Nonrheumatic aortic (valve) stenosis: Secondary | ICD-10-CM | POA: Diagnosis not present

## 2021-11-30 DIAGNOSIS — I350 Nonrheumatic aortic (valve) stenosis: Secondary | ICD-10-CM | POA: Diagnosis not present

## 2021-11-30 DIAGNOSIS — E785 Hyperlipidemia, unspecified: Secondary | ICD-10-CM | POA: Diagnosis not present

## 2021-11-30 DIAGNOSIS — I10 Essential (primary) hypertension: Secondary | ICD-10-CM | POA: Diagnosis not present

## 2021-11-30 DIAGNOSIS — I739 Peripheral vascular disease, unspecified: Secondary | ICD-10-CM | POA: Diagnosis not present

## 2021-11-30 DIAGNOSIS — I2510 Atherosclerotic heart disease of native coronary artery without angina pectoris: Secondary | ICD-10-CM | POA: Diagnosis not present

## 2021-11-30 DIAGNOSIS — I351 Nonrheumatic aortic (valve) insufficiency: Secondary | ICD-10-CM | POA: Diagnosis not present

## 2021-12-01 DIAGNOSIS — I2510 Atherosclerotic heart disease of native coronary artery without angina pectoris: Secondary | ICD-10-CM | POA: Diagnosis not present

## 2021-12-01 DIAGNOSIS — E785 Hyperlipidemia, unspecified: Secondary | ICD-10-CM | POA: Diagnosis not present

## 2021-12-01 DIAGNOSIS — I351 Nonrheumatic aortic (valve) insufficiency: Secondary | ICD-10-CM | POA: Diagnosis not present

## 2021-12-01 DIAGNOSIS — I350 Nonrheumatic aortic (valve) stenosis: Secondary | ICD-10-CM | POA: Diagnosis not present

## 2021-12-01 DIAGNOSIS — I10 Essential (primary) hypertension: Secondary | ICD-10-CM | POA: Diagnosis not present

## 2021-12-01 DIAGNOSIS — I739 Peripheral vascular disease, unspecified: Secondary | ICD-10-CM | POA: Diagnosis not present

## 2021-12-03 DIAGNOSIS — I351 Nonrheumatic aortic (valve) insufficiency: Secondary | ICD-10-CM | POA: Diagnosis not present

## 2021-12-03 DIAGNOSIS — I10 Essential (primary) hypertension: Secondary | ICD-10-CM | POA: Diagnosis not present

## 2021-12-03 DIAGNOSIS — I739 Peripheral vascular disease, unspecified: Secondary | ICD-10-CM | POA: Diagnosis not present

## 2021-12-03 DIAGNOSIS — E785 Hyperlipidemia, unspecified: Secondary | ICD-10-CM | POA: Diagnosis not present

## 2021-12-03 DIAGNOSIS — I350 Nonrheumatic aortic (valve) stenosis: Secondary | ICD-10-CM | POA: Diagnosis not present

## 2021-12-03 DIAGNOSIS — I2510 Atherosclerotic heart disease of native coronary artery without angina pectoris: Secondary | ICD-10-CM | POA: Diagnosis not present

## 2021-12-04 DIAGNOSIS — I2510 Atherosclerotic heart disease of native coronary artery without angina pectoris: Secondary | ICD-10-CM | POA: Diagnosis not present

## 2021-12-04 DIAGNOSIS — E785 Hyperlipidemia, unspecified: Secondary | ICD-10-CM | POA: Diagnosis not present

## 2021-12-04 DIAGNOSIS — I739 Peripheral vascular disease, unspecified: Secondary | ICD-10-CM | POA: Diagnosis not present

## 2021-12-04 DIAGNOSIS — I350 Nonrheumatic aortic (valve) stenosis: Secondary | ICD-10-CM | POA: Diagnosis not present

## 2021-12-04 DIAGNOSIS — I351 Nonrheumatic aortic (valve) insufficiency: Secondary | ICD-10-CM | POA: Diagnosis not present

## 2021-12-04 DIAGNOSIS — I10 Essential (primary) hypertension: Secondary | ICD-10-CM | POA: Diagnosis not present

## 2021-12-05 DIAGNOSIS — I10 Essential (primary) hypertension: Secondary | ICD-10-CM | POA: Diagnosis not present

## 2021-12-05 DIAGNOSIS — I351 Nonrheumatic aortic (valve) insufficiency: Secondary | ICD-10-CM | POA: Diagnosis not present

## 2021-12-05 DIAGNOSIS — E785 Hyperlipidemia, unspecified: Secondary | ICD-10-CM | POA: Diagnosis not present

## 2021-12-05 DIAGNOSIS — I350 Nonrheumatic aortic (valve) stenosis: Secondary | ICD-10-CM | POA: Diagnosis not present

## 2021-12-05 DIAGNOSIS — I739 Peripheral vascular disease, unspecified: Secondary | ICD-10-CM | POA: Diagnosis not present

## 2021-12-05 DIAGNOSIS — I2510 Atherosclerotic heart disease of native coronary artery without angina pectoris: Secondary | ICD-10-CM | POA: Diagnosis not present

## 2021-12-06 DIAGNOSIS — I739 Peripheral vascular disease, unspecified: Secondary | ICD-10-CM | POA: Diagnosis not present

## 2021-12-06 DIAGNOSIS — I350 Nonrheumatic aortic (valve) stenosis: Secondary | ICD-10-CM | POA: Diagnosis not present

## 2021-12-06 DIAGNOSIS — I2510 Atherosclerotic heart disease of native coronary artery without angina pectoris: Secondary | ICD-10-CM | POA: Diagnosis not present

## 2021-12-06 DIAGNOSIS — E785 Hyperlipidemia, unspecified: Secondary | ICD-10-CM | POA: Diagnosis not present

## 2021-12-06 DIAGNOSIS — I351 Nonrheumatic aortic (valve) insufficiency: Secondary | ICD-10-CM | POA: Diagnosis not present

## 2021-12-06 DIAGNOSIS — I10 Essential (primary) hypertension: Secondary | ICD-10-CM | POA: Diagnosis not present

## 2021-12-07 ENCOUNTER — Telehealth: Payer: Self-pay

## 2021-12-07 DIAGNOSIS — I2510 Atherosclerotic heart disease of native coronary artery without angina pectoris: Secondary | ICD-10-CM | POA: Diagnosis not present

## 2021-12-07 DIAGNOSIS — I739 Peripheral vascular disease, unspecified: Secondary | ICD-10-CM | POA: Diagnosis not present

## 2021-12-07 DIAGNOSIS — I351 Nonrheumatic aortic (valve) insufficiency: Secondary | ICD-10-CM | POA: Diagnosis not present

## 2021-12-07 DIAGNOSIS — I350 Nonrheumatic aortic (valve) stenosis: Secondary | ICD-10-CM | POA: Diagnosis not present

## 2021-12-07 DIAGNOSIS — I10 Essential (primary) hypertension: Secondary | ICD-10-CM | POA: Diagnosis not present

## 2021-12-07 DIAGNOSIS — E785 Hyperlipidemia, unspecified: Secondary | ICD-10-CM | POA: Diagnosis not present

## 2021-12-07 NOTE — Telephone Encounter (Signed)
[name], Nurse with [facility] called to inform you that patient passed away this morning at 8:53 am.  Message routed to Marlowe Sax, NP

## 2021-12-07 NOTE — Telephone Encounter (Signed)
So sorry to hear of his death.It's been a pleasure knowing and taking care of him.My condolence to the family.

## 2021-12-15 DEATH — deceased
